# Patient Record
Sex: Female | Born: 1960 | Race: Black or African American | Hispanic: No | Marital: Married | Smoking: Never smoker
Health system: Southern US, Community
[De-identification: ages and names within clinical notes are randomized; demographics above are authoritative.]

## PROBLEM LIST (undated history)

## (undated) DIAGNOSIS — D649 Anemia, unspecified: Secondary | ICD-10-CM

## (undated) DIAGNOSIS — J189 Pneumonia, unspecified organism: Secondary | ICD-10-CM

## (undated) DIAGNOSIS — E559 Vitamin D deficiency, unspecified: Secondary | ICD-10-CM

## (undated) DIAGNOSIS — Z21 Asymptomatic human immunodeficiency virus [HIV] infection status: Secondary | ICD-10-CM

## (undated) DIAGNOSIS — R21 Rash and other nonspecific skin eruption: Secondary | ICD-10-CM

## (undated) DIAGNOSIS — B20 Human immunodeficiency virus [HIV] disease: Secondary | ICD-10-CM

## (undated) HISTORY — PX: TUBAL LIGATION: SHX77

## (undated) HISTORY — DX: Rash and other nonspecific skin eruption: R21

---

## 2011-05-28 ENCOUNTER — Emergency Department (HOSPITAL_BASED_OUTPATIENT_CLINIC_OR_DEPARTMENT_OTHER)
Admission: EM | Admit: 2011-05-28 | Discharge: 2011-05-28 | Disposition: A | Discharge status: Home / Self Care | Payer: Managed Care, Other (non HMO) | Attending: Emergency Medicine | Admitting: Emergency Medicine

## 2011-05-28 ENCOUNTER — Encounter (HOSPITAL_BASED_OUTPATIENT_CLINIC_OR_DEPARTMENT_OTHER): Payer: Self-pay | Admitting: *Deleted

## 2011-05-28 DIAGNOSIS — B029 Zoster without complications: Secondary | ICD-10-CM | POA: Insufficient documentation

## 2011-05-28 MED ORDER — ACYCLOVIR 800 MG PO TABS: 800.0000 mg | ORAL_TABLET | Freq: Four times a day (QID) | ORAL | Status: AC

## 2011-05-28 NOTE — ED Notes (Signed)
Rash on her left flank and mid abd. No pain. States friends told her she might have shingles.

## 2011-05-28 NOTE — ED Provider Notes (Signed)
Medical screening examination/treatment/procedure(s) were performed by non-physician practitioner and as supervising physician I was immediately available for consultation/collaboration. Shi Grose Y.   Gavin Pound. Oletta Lamas, MD 05/28/11 2358

## 2011-05-28 NOTE — ED Provider Notes (Signed)
History     CSN: [id number]  Arrival date & time 05/28/11  7829   First MD Initiated Contact with Patient 05/28/11 1959      Chief Complaint  Patient presents with  . Rash    (Consider location/radiation/quality/duration/timing/severity/associated sxs/prior treatment) Patient is a 51 y.o. female presenting with rash. The history is provided by the patient. No language interpreter was used.  Rash  This is a new problem. The current episode started more than 1 week ago. The problem has not changed since onset.The problem is associated with nothing. There has been no fever. The rash is present on the torso, back and abdomen. The pain is at a severity of 0/10. The patient is experiencing no pain. Associated symptoms include itching. She has tried nothing for the symptoms.  Pt thinks she may have shingles.  Pt reports itching but no pain  History reviewed. No pertinent past medical history.  History reviewed. No pertinent past surgical history.  No family history on file.  History  Substance Use Topics  . Smoking status: Never Smoker   . Smokeless tobacco: Not on file  . Alcohol Use: No    OB History    Grav Para Term Preterm Abortions TAB SAB Ect Mult Living                  Review of Systems  Skin: Positive for itching and rash.  All other systems reviewed and are negative.    Allergies  Review of patient's allergies indicates no known allergies.  Home Medications  No current outpatient prescriptions on file.  BP 179/94  Pulse 92  Temp(Src) 98.3 F (36.8 C) (Oral)  Resp 20  SpO2 100%  Physical Exam  Vitals reviewed. Constitutional: She appears well-developed and well-nourished.  HENT:  Head: Normocephalic.  Neck: Normal range of motion.  Cardiovascular: Normal rate.   Pulmonary/Chest: Effort normal.  Abdominal: Soft.  Musculoskeletal: Normal range of motion.  Neurological: She is alert.  Skin: Rash noted.       Rash back and abdomen,  Does not cross  midline,  Drying,  Psychiatric: She has a normal mood and affect.    ED Course  Procedures (including critical care time)  Labs Reviewed - No data to display No results found.   No diagnosis found.    MDM  I will treat with acyclovir.  Pt declined medication for pain.  I advised see her Md for recheck in 1 week.        Langston Masker, [place] 05/28/11 2024

## 2015-08-09 ENCOUNTER — Emergency Department (HOSPITAL_BASED_OUTPATIENT_CLINIC_OR_DEPARTMENT_OTHER): Payer: Managed Care, Other (non HMO)

## 2015-08-09 ENCOUNTER — Emergency Department (HOSPITAL_BASED_OUTPATIENT_CLINIC_OR_DEPARTMENT_OTHER)
Admission: EM | Admit: 2015-08-09 | Discharge: 2015-08-10 | Disposition: A | Discharge status: Home / Self Care | Payer: Managed Care, Other (non HMO) | Attending: Emergency Medicine | Admitting: Emergency Medicine

## 2015-08-09 ENCOUNTER — Encounter (HOSPITAL_BASED_OUTPATIENT_CLINIC_OR_DEPARTMENT_OTHER): Payer: Self-pay | Admitting: *Deleted

## 2015-08-09 DIAGNOSIS — R5383 Other fatigue: Secondary | ICD-10-CM

## 2015-08-09 DIAGNOSIS — Z862 Personal history of diseases of the blood and blood-forming organs and certain disorders involving the immune mechanism: Secondary | ICD-10-CM | POA: Insufficient documentation

## 2015-08-09 DIAGNOSIS — Z79899 Other long term (current) drug therapy: Secondary | ICD-10-CM | POA: Insufficient documentation

## 2015-08-09 DIAGNOSIS — E876 Hypokalemia: Secondary | ICD-10-CM | POA: Insufficient documentation

## 2015-08-09 HISTORY — DX: Anemia, unspecified: D64.9

## 2015-08-09 LAB — URINALYSIS, ROUTINE W REFLEX MICROSCOPIC
Glucose, UA: NEGATIVE mg/dL
Ketones, ur: 15 mg/dL — AB
Nitrite: NEGATIVE
PH: 6 (ref 5.0–8.0)
PROTEIN: 100 mg/dL — AB
SPECIFIC GRAVITY, URINE: 1.022 (ref 1.005–1.030)

## 2015-08-09 LAB — URINE MICROSCOPIC-ADD ON

## 2015-08-09 LAB — BRAIN NATRIURETIC PEPTIDE: B NATRIURETIC PEPTIDE 5: 12.2 pg/mL (ref 0.0–100.0)

## 2015-08-09 LAB — CBC WITH DIFFERENTIAL/PLATELET
BASOS ABS: 0 10*3/uL (ref 0.0–0.1)
BASOS PCT: 0 %
EOS ABS: 0.2 10*3/uL (ref 0.0–0.7)
Eosinophils Relative: 4 %
HEMATOCRIT: 30.5 % — AB (ref 36.0–46.0)
HEMOGLOBIN: 10.6 g/dL — AB (ref 12.0–15.0)
Lymphocytes Relative: 14 %
Lymphs Abs: 0.6 10*3/uL — ABNORMAL LOW (ref 0.7–4.0)
MCH: 30.2 pg (ref 26.0–34.0)
MCHC: 34.8 g/dL (ref 30.0–36.0)
MCV: 86.9 fL (ref 78.0–100.0)
MONO ABS: 0.6 10*3/uL (ref 0.1–1.0)
MONOS PCT: 14 %
NEUTROS ABS: 3 10*3/uL (ref 1.7–7.7)
NEUTROS PCT: 68 %
Platelets: 223 10*3/uL (ref 150–400)
RBC: 3.51 MIL/uL — ABNORMAL LOW (ref 3.87–5.11)
RDW: 12.9 % (ref 11.5–15.5)
WBC: 4.3 10*3/uL (ref 4.0–10.5)

## 2015-08-09 LAB — COMPREHENSIVE METABOLIC PANEL
ALK PHOS: 47 U/L (ref 38–126)
ALT: 13 U/L — ABNORMAL LOW (ref 14–54)
ANION GAP: 7 (ref 5–15)
AST: 38 U/L (ref 15–41)
Albumin: 3 g/dL — ABNORMAL LOW (ref 3.5–5.0)
BILIRUBIN TOTAL: 0.9 mg/dL (ref 0.3–1.2)
BUN: 14 mg/dL (ref 6–20)
CALCIUM: 8.5 mg/dL — AB (ref 8.9–10.3)
CO2: 25 mmol/L (ref 22–32)
Chloride: 101 mmol/L (ref 101–111)
Creatinine, Ser: 0.93 mg/dL (ref 0.44–1.00)
GFR calc Af Amer: >60 mL/min (ref >60)
GLUCOSE: 95 mg/dL (ref 65–99)
POTASSIUM: 3.1 mmol/L — AB (ref 3.5–5.1)
Sodium: 133 mmol/L — ABNORMAL LOW (ref 135–145)
TOTAL PROTEIN: 8.4 g/dL — AB (ref 6.5–8.1)

## 2015-08-09 LAB — D-DIMER, QUANTITATIVE (NOT AT ARMC): D DIMER QUANT: 1.21 ug{FEU}/mL — AB (ref 0.00–0.50)

## 2015-08-09 LAB — TROPONIN I

## 2015-08-09 MED ORDER — SODIUM CHLORIDE 0.9 % IV BOLUS (SEPSIS)
1000.0000 mL | Freq: Once | INTRAVENOUS | Status: AC
Start: 2015-08-09 — End: 2015-08-10
  Administered 2015-08-09: 1000 mL via INTRAVENOUS

## 2015-08-09 NOTE — ED Notes (Addendum)
Pt c/o fatigue and generalized weakness, n/v  with SOB on exertion x 2 week. HX anemia and weight loss

## 2015-08-09 NOTE — ED Notes (Signed)
Pt placed on auto vitals Q30. Patient placed on cardiac monitor.  

## 2015-08-09 NOTE — ED Provider Notes (Addendum)
CSN: [id number]     Arrival date & time 08/09/15  2013 History   First MD Initiated Contact with Patient 08/09/15 2257     Chief Complaint  Patient presents with  . Fatigue     (Consider location/radiation/quality/duration/timing/severity/associated sxs/prior Treatment) HPI Comments: 55 year old female with a history of anemia who presents with fatigue. The patient reports a 2 week history of generalized weakness and fatigue. She states that she has had a mildly decreased appetite because of it. She reports one episode of nausea and vomiting this morning but no persistent vomiting and no diarrhea. She currently takes iron which causes dark stools but she denies any change in her stool color and no bloody stools. No chest pain, abdominal pain, cough/cold symptoms, fevers, or recent illness. She does report some mild shortness of breath with exertion, particularly when she is walking up her driveway. This is also been going on for approximately 2 weeks. No shortness of breath at rest and no sudden onset of shortness of breath. No orthopnea or paroxysmal nocturnal dyspnea. No recent travel, history of blood clots, OCP use, or history of cancer. Family history notable for father with diabetes and possible blood clot.  The history is provided by the patient.    Past Medical History  Diagnosis Date  . Anemia    Past Surgical History  Procedure Laterality Date  . Tubal ligation     History reviewed. No pertinent family history. Social History  Substance Use Topics  . Smoking status: Never Smoker   . Smokeless tobacco: None  . Alcohol Use: No   OB History    No data available     Review of Systems 10 Systems reviewed and are negative for acute change except as noted in the HPI.    Allergies  Review of patient's allergies indicates no known allergies.  Home Medications   Prior to Admission medications   Medication Sig Start Date End Date Taking? Authorizing Provider  ferrous  sulfate 325 (65 FE) MG tablet Take 325 mg by mouth daily with breakfast.   Yes Historical Provider, MD  vitamin B-12 (CYANOCOBALAMIN) 100 MCG tablet Take 100 mcg by mouth daily.   Yes Historical Provider, MD  azithromycin (ZITHROMAX Z-PAK) 250 MG tablet Take 1 tablet (250 mg total) by mouth daily.  (2 tablets) by mouth day 1, then  (1 tablet) by mouth days 2-5 08/10/15   Laurence Spates, MD   BP 136/78 mmHg  Pulse 81  Temp(Src) 99.8 F (37.7 C) (Oral)  Resp 18  Ht  (1.6 m)  Wt 150 lb (68.04 kg)  BMI 26.58 kg/m2  SpO2 95% Physical Exam  Constitutional: She is oriented to person, place, and time. She appears well-developed and well-nourished. No distress.  HENT:  Head: Normocephalic and atraumatic.  Moist mucous membranes  Eyes: Conjunctivae are normal. Pupils are equal, round, and reactive to light.  Neck: Neck supple.  Cardiovascular: Normal rate, regular rhythm and normal heart sounds.   No murmur heard. Pulmonary/Chest: Effort normal and breath sounds normal.  Abdominal: Soft. Bowel sounds are normal. She exhibits no distension. There is no tenderness.  Musculoskeletal: She exhibits no edema.  Neurological: She is alert and oriented to person, place, and time.  Fluent speech  Skin: Skin is warm and dry.  Psychiatric: She has a normal mood and affect. Judgment normal.  Nursing note and vitals reviewed.   ED Course  Procedures (including critical care time) Labs Review Labs Reviewed  URINALYSIS, ROUTINE W  REFLEX MICROSCOPIC (NOT AT [facility]) - Abnormal; Notable for the following:    Color, Urine AMBER (*)    Hgb urine dipstick SMALL (*)    Bilirubin Urine SMALL (*)    Ketones, ur 15 (*)    Protein, ur 100 (*)    Leukocytes, UA TRACE (*)    All other components within normal limits  URINE MICROSCOPIC-ADD ON - Abnormal; Notable for the following:    Squamous Epithelial / LPF 0-5 (*)    Bacteria, UA RARE (*)    Casts GRANULAR CAST (*)    All other  components within normal limits  COMPREHENSIVE METABOLIC PANEL - Abnormal; Notable for the following:    Sodium 133 (*)    Potassium 3.1 (*)    Calcium 8.5 (*)    Total Protein 8.4 (*)    Albumin 3.0 (*)    ALT 13 (*)    All other components within normal limits  CBC WITH DIFFERENTIAL/PLATELET - Abnormal; Notable for the following:    RBC 3.51 (*)    Hemoglobin 10.6 (*)    HCT 30.5 (*)    Lymphs Abs 0.6 (*)    All other components within normal limits  D-DIMER, QUANTITATIVE (NOT AT [facility]) - Abnormal; Notable for the following:    D-Dimer, Quant 1.21 (*)    All other components within normal limits  BRAIN NATRIURETIC PEPTIDE  TROPONIN I    Imaging Review Dg Chest 2 View  08/09/2015  CLINICAL DATA:  Fatigue, weakness, shortness of breath on exertion, nausea, and vomiting. Anemia. Weight loss. Nonsmoker. EXAM: CHEST  2 VIEW COMPARISON:  None. FINDINGS: The heart size and mediastinal contours are within normal limits. Both lungs are clear. The visualized skeletal structures are unremarkable. IMPRESSION: No active cardiopulmonary disease. Electronically Signed   By: Burman Nieves M.D.   On: 08/09/2015 23:52   Ct Angio Chest Pe W/cm &/or Wo Cm  08/10/2015  CLINICAL DATA:  Shortness of breath on exertion. Fatigue. Weakness and lethargy for 2 weeks. Elevated D-dimer. EXAM: CT ANGIOGRAPHY CHEST WITH CONTRAST TECHNIQUE: Multidetector CT imaging of the chest was performed using the standard protocol during bolus administration of intravenous contrast. Multiplanar CT image reconstructions and MIPs were obtained to evaluate the vascular anatomy. CONTRAST:  80 mL Isovue 370 IV COMPARISON:  Radiographs earlier this day. FINDINGS: There are no filling defects within the pulmonary arteries to suggest pulmonary embolus. Normal caliber thoracic aorta without dissection or aneurysm. Common origin of the brachiocephalic and left common carotid artery, normal variant arch configuration. Borderline  cardiomegaly. No pericardial effusion. No adenopathy. Bilateral ground-glass opacities in the perihilar lungs, most prominent in the upper lobes. No septal thickening. There is mild bronchial thickening. Trachea and mainstem bronchi are patent. Focal small pleural based opacity in the right lower lobe likely secondary to atelectasis. No pulmonary mass. No pleural effusion. No acute abnormality in the included upper abdomen. There are no acute or suspicious osseous abnormalities. Review of the MIP images confirms the above findings. IMPRESSION: 1. No pulmonary embolus. 2. Bilateral ground-glass opacities, most prominent in the upper lobes and perihilar lungs. Considerations include pulmonary edema, pneumonitis with possible atypical infection or inflammatory etiologies. Pulmonary hemorrhage is not entirely excluded but felt less likely. Electronically Signed   By: Rubye Oaks M.D.   On: 08/10/2015 01:30   I have personally reviewed and evaluated these lab results as part of my medical decision-making.   EKG Interpretation   Date/Time:  Tuesday August 09 2015 23:30:23 EDT  Ventricular Rate:  85 PR Interval:  170 QRS Duration: 95 QT Interval:  350 QTC Calculation: 416 R Axis:   27 Text Interpretation:  Sinus rhythm Anterior infarct, old T wave inversion  V3, no previous available for comparison Confirmed by LITTLE MD, RACHEL  [phone]([id number]) on 08/09/2015 11:39:35 PM     Medications  sodium chloride 0.9 % bolus 1,000 mL (0 mLs Intravenous Stopped 08/10/15 0050)  iopamidol (ISOVUE-370) 76 % injection 80 mL (80 mLs Intravenous Contrast Given 08/10/15 0056)  potassium chloride SA (K-DUR,KLOR-CON) CR tablet 60 mEq (60 mEq Oral Given 08/10/15 0200)       MDM   Final diagnoses:  Other fatigue  Hypokalemia   Patient with 2 weeks of generalized fatigue and weakness without fever or any other infectious symptoms. She also reports 2 weeks of mild shortness of breath when walking up her driveway. She was  awake and were alert, comfortable on exam. Vital signs notable for mild hypertension. EKG without acute ischemic changes. Obtained above lab work and gave the patient an IV fluid bolus.  Labwork shows mild hypokalemia with potassium 3.1, mild anemia with hemoglobin 10.6, elevated d-dimer at 1.2, normal troponin. Obtained CT of chest to rule out PE. Gave the patient oral potassium repletion. CTA shows no PE however bilateral groundglass opacities, pulmonary edema of versus pneumonitis from atypical infection or inflammatory process. BNP normal there for heart failure unlikely. I discussed these findings with the patient and her husband. Given that she has had shortness of breath and fatigue recently, gave CAP treatment with azithromycin in event that the CT findings are due to bacterial process. I emphasized the importance of PCP follow-up for reevaluation after completion of antibiotics, as the patient may need further workup for her symptoms if they do not improve. Discussed dietary supplementation of potassium. I reviewed return precautions and the patient voiced understanding. Patient discharged in satisfactory condition.   Laurence Spatesachel Morgan Little, MD 08/10/15 [phone]

## 2015-08-09 NOTE — ED Notes (Signed)
Patient transported to X-ray 

## 2015-08-09 NOTE — ED Notes (Signed)
Warm blanket brought to pt. 

## 2015-08-09 NOTE — ED Notes (Signed)
Pt states she is jsut feeling weak and not able to bounce back

## 2015-08-10 ENCOUNTER — Emergency Department (HOSPITAL_BASED_OUTPATIENT_CLINIC_OR_DEPARTMENT_OTHER): Payer: Managed Care, Other (non HMO)

## 2015-08-10 MED ORDER — IOPAMIDOL (ISOVUE-370) INJECTION 76%
80.0000 mL | Freq: Once | INTRAVENOUS | Status: AC | PRN
  Administered 2015-08-10: 80 mL via INTRAVENOUS

## 2015-08-10 MED ORDER — POTASSIUM CHLORIDE CRYS ER 20 MEQ PO TBCR
60.0000 meq | EXTENDED_RELEASE_TABLET | Freq: Once | ORAL | Status: AC
  Administered 2015-08-10: 60 meq via ORAL
  Filled 2015-08-10: qty 3

## 2015-08-10 MED ORDER — AZITHROMYCIN 250 MG PO TABS: 250.0000 mg | ORAL_TABLET | Freq: Every day | ORAL | Status: DC

## 2015-08-10 NOTE — ED Notes (Signed)
Pt back from CT

## 2015-08-10 NOTE — Discharge Instructions (Signed)
Fatigue °Fatigue is feeling tired all of the time, a lack of energy, or a lack of motivation. Occasional or mild fatigue is often a normal response to activity or life in general. However, long-lasting (chronic) or extreme fatigue may indicate an underlying medical condition. °HOME CARE INSTRUCTIONS  °Watch your fatigue for any changes. The following actions may help to lessen any discomfort you are feeling: °· Talk to your health care provider about how much sleep you need each night. Try to get the required amount every night. °· Take medicines only as directed by your health care provider. °· Eat a healthy and nutritious diet. Ask your health care provider if you need help changing your diet. °· Drink enough fluid to keep your urine clear or pale yellow. °· Practice ways of relaxing, such as yoga, meditation, massage therapy, or acupuncture. °· Exercise regularly.   °· Change situations that cause you stress. Try to keep your work and personal routine reasonable. °· Do not abuse illegal drugs. °· Limit alcohol intake to no more than 1 drink per day for nonpregnant women and 2 drinks per day for men. One drink equals 12 ounces of beer, 5 ounces of wine, or 1½ ounces of hard liquor. °· Take a multivitamin, if directed by your health care provider. °SEEK MEDICAL CARE IF:  °· Your fatigue does not get better. °· You have a fever.   °· You have unintentional weight loss or gain. °· You have headaches.   °· You have difficulty:   °· Falling asleep. °· Sleeping throughout the night. °· You feel angry, guilty, anxious, or sad.    °· You are unable to have a bowel movement (constipation).   °· You skin is dry.    °· Your legs or another part of your body is swollen.   °SEEK IMMEDIATE MEDICAL CARE IF:  °· You feel confused.   °· Your vision is blurry. °· You feel faint or pass out.   °· You have a severe headache.   °· You have severe abdominal, pelvic, or back pain.   °· You have chest pain, shortness of breath, or an  irregular or fast heartbeat.   °· You are unable to urinate or you urinate less than normal.   °· You develop abnormal bleeding, such as bleeding from the rectum, vagina, nose, lungs, or nipples. °· You vomit blood.    °· You have thoughts about harming yourself or committing suicide.   °· You are worried that you might harm someone else.   °  °This information is not intended to replace advice given to you by your health care provider. Make sure you discuss any questions you have with your health care provider. °  °Document Released: 02/04/2007 Document Revised: 04/30/2014 Document Reviewed: 08/11/2013 °Elsevier Interactive Patient Education ©2016 [employer] ° °Hypokalemia °Hypokalemia means that the amount of potassium in the blood is lower than normal. Potassium is a chemical, called an electrolyte, that helps regulate the amount of fluid in the body. It also stimulates muscle contraction and helps nerves function properly. Most of the body's potassium is inside of cells, and only a very small amount is in the blood. Because the amount in the blood is so small, minor changes can be life-threatening. °CAUSES °· Antibiotics. °· Diarrhea or vomiting. °· Using laxatives too much, which can cause diarrhea. °· Chronic kidney disease. °· Water pills (diuretics). °· Eating disorders (bulimia). °· Low magnesium level. °· Sweating a lot. °SIGNS AND SYMPTOMS °· Weakness. °· Constipation. °· Fatigue. °· Muscle cramps. °· Mental confusion. °· Skipped heartbeats or irregular heartbeat (palpitations). °· Tingling or   numbness. °DIAGNOSIS  °Your health care provider can diagnose hypokalemia with blood tests. In addition to checking your potassium level, your health care provider may also check other lab tests. °TREATMENT °Hypokalemia can be treated with potassium supplements taken by mouth or adjustments in your current medicines. If your potassium level is very low, you may need to get potassium through a vein (IV) and be  monitored in the hospital. A diet high in potassium is also helpful. Foods high in potassium are: °· Nuts, such as peanuts and pistachios. °· Seeds, such as sunflower seeds and pumpkin seeds. °· Peas, lentils, and lima beans. °· Whole grain and bran cereals and breads. °· Fresh fruit and vegetables, such as apricots, avocado, bananas, cantaloupe, kiwi, oranges, tomatoes, asparagus, and potatoes. °· Orange and tomato juices. °· Red meats. °· Fruit yogurt. °HOME CARE INSTRUCTIONS °· Take all medicines as prescribed by your health care provider. °· Maintain a healthy diet by including nutritious food, such as fruits, vegetables, nuts, whole grains, and lean meats. °· If you are taking a laxative, be sure to follow the directions on the label. °SEEK MEDICAL CARE IF: °· Your weakness gets worse. °· You feel your heart pounding or racing. °· You are vomiting or having diarrhea. °· You are diabetic and having trouble keeping your blood glucose in the normal range. °SEEK IMMEDIATE MEDICAL CARE IF: °· You have chest pain, shortness of breath, or dizziness. °· You are vomiting or having diarrhea for more than 2 days. °· You faint. °MAKE SURE YOU:  °· Understand these instructions. °· Will watch your condition. °· Will get help right away if you are not doing well or get worse. °  °This information is not intended to replace advice given to you by your health care provider. Make sure you discuss any questions you have with your health care provider. °  °Document Released: 04/09/2005 Document Revised: 04/30/2014 Document Reviewed: 10/10/2012 °Elsevier Interactive Patient Education ©2016 [employer] Inc[employer] ° °

## 2015-08-10 NOTE — ED Notes (Signed)
Patient transported to CT 

## 2015-10-24 ENCOUNTER — Encounter (HOSPITAL_BASED_OUTPATIENT_CLINIC_OR_DEPARTMENT_OTHER): Payer: Self-pay | Admitting: *Deleted

## 2015-10-24 ENCOUNTER — Inpatient Hospital Stay (HOSPITAL_COMMUNITY): Payer: Managed Care, Other (non HMO)

## 2015-10-24 ENCOUNTER — Inpatient Hospital Stay (HOSPITAL_BASED_OUTPATIENT_CLINIC_OR_DEPARTMENT_OTHER)
Admission: EM | Admit: 2015-10-24 | Discharge: 2015-10-31 | DRG: 974 | Disposition: A | Discharge status: Home Health | Payer: Managed Care, Other (non HMO) | Attending: Internal Medicine | Admitting: Internal Medicine

## 2015-10-24 ENCOUNTER — Emergency Department (HOSPITAL_BASED_OUTPATIENT_CLINIC_OR_DEPARTMENT_OTHER): Payer: Managed Care, Other (non HMO)

## 2015-10-24 DIAGNOSIS — E876 Hypokalemia: Secondary | ICD-10-CM

## 2015-10-24 DIAGNOSIS — D649 Anemia, unspecified: Secondary | ICD-10-CM | POA: Diagnosis present

## 2015-10-24 DIAGNOSIS — B20 Human immunodeficiency virus [HIV] disease: Principal | ICD-10-CM | POA: Diagnosis present

## 2015-10-24 DIAGNOSIS — R918 Other nonspecific abnormal finding of lung field: Secondary | ICD-10-CM | POA: Diagnosis not present

## 2015-10-24 DIAGNOSIS — R531 Weakness: Secondary | ICD-10-CM

## 2015-10-24 DIAGNOSIS — R64 Cachexia: Secondary | ICD-10-CM | POA: Diagnosis present

## 2015-10-24 DIAGNOSIS — R0600 Dyspnea, unspecified: Secondary | ICD-10-CM | POA: Diagnosis not present

## 2015-10-24 DIAGNOSIS — Z87891 Personal history of nicotine dependence: Secondary | ICD-10-CM | POA: Diagnosis not present

## 2015-10-24 DIAGNOSIS — R627 Adult failure to thrive: Secondary | ICD-10-CM | POA: Diagnosis present

## 2015-10-24 DIAGNOSIS — Z6821 Body mass index (BMI) 21.0-21.9, adult: Secondary | ICD-10-CM

## 2015-10-24 DIAGNOSIS — J189 Pneumonia, unspecified organism: Secondary | ICD-10-CM | POA: Diagnosis present

## 2015-10-24 DIAGNOSIS — D509 Iron deficiency anemia, unspecified: Secondary | ICD-10-CM | POA: Diagnosis present

## 2015-10-24 DIAGNOSIS — R634 Abnormal weight loss: Secondary | ICD-10-CM | POA: Diagnosis present

## 2015-10-24 DIAGNOSIS — E440 Moderate protein-calorie malnutrition: Secondary | ICD-10-CM | POA: Diagnosis present

## 2015-10-24 DIAGNOSIS — E46 Unspecified protein-calorie malnutrition: Secondary | ICD-10-CM

## 2015-10-24 DIAGNOSIS — B59 Pneumocystosis: Secondary | ICD-10-CM | POA: Diagnosis present

## 2015-10-24 DIAGNOSIS — R3121 Asymptomatic microscopic hematuria: Secondary | ICD-10-CM | POA: Diagnosis present

## 2015-10-24 DIAGNOSIS — E559 Vitamin D deficiency, unspecified: Secondary | ICD-10-CM | POA: Diagnosis present

## 2015-10-24 DIAGNOSIS — J9601 Acute respiratory failure with hypoxia: Secondary | ICD-10-CM | POA: Diagnosis present

## 2015-10-24 DIAGNOSIS — E43 Unspecified severe protein-calorie malnutrition: Secondary | ICD-10-CM | POA: Diagnosis present

## 2015-10-24 HISTORY — DX: Pneumonia, unspecified organism: J18.9

## 2015-10-24 HISTORY — DX: Vitamin D deficiency, unspecified: E55.9

## 2015-10-24 LAB — FERRITIN: Ferritin: 994 ng/mL — ABNORMAL HIGH (ref 11–307)

## 2015-10-24 LAB — COMPREHENSIVE METABOLIC PANEL
ALT: 13 U/L — AB (ref 14–54)
AST: 36 U/L (ref 15–41)
Albumin: 2.4 g/dL — ABNORMAL LOW (ref 3.5–5.0)
Alkaline Phosphatase: 53 U/L (ref 38–126)
Anion gap: 9 (ref 5–15)
BILIRUBIN TOTAL: 0.8 mg/dL (ref 0.3–1.2)
BUN: 12 mg/dL (ref 6–20)
CHLORIDE: 99 mmol/L — AB (ref 101–111)
CO2: 26 mmol/L (ref 22–32)
CREATININE: 0.86 mg/dL (ref 0.44–1.00)
Calcium: 8.3 mg/dL — ABNORMAL LOW (ref 8.9–10.3)
Glucose, Bld: 124 mg/dL — ABNORMAL HIGH (ref 65–99)
Potassium: 3.1 mmol/L — ABNORMAL LOW (ref 3.5–5.1)
Sodium: 134 mmol/L — ABNORMAL LOW (ref 135–145)
TOTAL PROTEIN: 7.6 g/dL (ref 6.5–8.1)

## 2015-10-24 LAB — AMMONIA: AMMONIA: 26 umol/L (ref 9–35)

## 2015-10-24 LAB — CBC WITH DIFFERENTIAL/PLATELET
Basophils Absolute: 0 10*3/uL (ref 0.0–0.1)
Basophils Relative: 0 %
EOS PCT: 1 %
Eosinophils Absolute: 0.1 10*3/uL (ref 0.0–0.7)
HEMATOCRIT: 29.4 % — AB (ref 36.0–46.0)
Hemoglobin: 9.9 g/dL — ABNORMAL LOW (ref 12.0–15.0)
LYMPHS ABS: 0.4 10*3/uL — AB (ref 0.7–4.0)
Lymphocytes Relative: 8 %
MCH: 30 pg (ref 26.0–34.0)
MCHC: 33.7 g/dL (ref 30.0–36.0)
MCV: 89.1 fL (ref 78.0–100.0)
MONOS PCT: 11 %
Monocytes Absolute: 0.6 10*3/uL (ref 0.1–1.0)
NEUTROS ABS: 4.5 10*3/uL (ref 1.7–7.7)
Neutrophils Relative %: 80 %
PLATELETS: 324 10*3/uL (ref 150–400)
RBC: 3.3 MIL/uL — AB (ref 3.87–5.11)
RDW: 13.9 % (ref 11.5–15.5)
WBC: 5.6 10*3/uL (ref 4.0–10.5)

## 2015-10-24 LAB — BRAIN NATRIURETIC PEPTIDE: B Natriuretic Peptide: 45.8 pg/mL (ref 0.0–100.0)

## 2015-10-24 LAB — OCCULT BLOOD X 1 CARD TO LAB, STOOL: FECAL OCCULT BLD: NEGATIVE

## 2015-10-24 LAB — TROPONIN I
TROPONIN I: 0.03 ng/mL — AB (ref <0.03)
Troponin I: <0.03 ng/mL (ref <0.03)

## 2015-10-24 LAB — URINE MICROSCOPIC-ADD ON: WBC, UA: NONE SEEN WBC/hpf (ref 0–5)

## 2015-10-24 LAB — RETICULOCYTES
RBC.: 2.82 MIL/uL — ABNORMAL LOW (ref 3.87–5.11)
RETIC COUNT ABSOLUTE: 47.9 10*3/uL (ref 19.0–186.0)
Retic Ct Pct: 1.7 % (ref 0.4–3.1)

## 2015-10-24 LAB — SAVE SMEAR

## 2015-10-24 LAB — URINALYSIS, ROUTINE W REFLEX MICROSCOPIC
Glucose, UA: NEGATIVE mg/dL
KETONES UR: 15 mg/dL — AB
Leukocytes, UA: NEGATIVE
NITRITE: NEGATIVE
PROTEIN: 100 mg/dL — AB
SPECIFIC GRAVITY, URINE: 1.023 (ref 1.005–1.030)
pH: 6.5 (ref 5.0–8.0)

## 2015-10-24 LAB — LACTIC ACID, PLASMA
LACTIC ACID, VENOUS: 0.6 mmol/L (ref 0.5–1.9)
LACTIC ACID, VENOUS: 1.1 mmol/L (ref 0.5–1.9)

## 2015-10-24 LAB — EXPECTORATED SPUTUM ASSESSMENT W REFEX TO RESP CULTURE

## 2015-10-24 LAB — EXPECTORATED SPUTUM ASSESSMENT W GRAM STAIN, RFLX TO RESP C

## 2015-10-24 LAB — IRON AND TIBC
IRON: 11 ug/dL — AB (ref 28–170)
Saturation Ratios: 10 % — ABNORMAL LOW (ref 10.4–31.8)
TIBC: 108 ug/dL — ABNORMAL LOW (ref 250–450)
UIBC: 97 ug/dL

## 2015-10-24 LAB — VITAMIN B12: VITAMIN B 12: 1059 pg/mL — AB (ref 180–914)

## 2015-10-24 LAB — I-STAT CG4 LACTIC ACID, ED: LACTIC ACID, VENOUS: 1.05 mmol/L (ref 0.5–1.9)

## 2015-10-24 LAB — STREP PNEUMONIAE URINARY ANTIGEN: Strep Pneumo Urinary Antigen: POSITIVE — AB

## 2015-10-24 LAB — FOLATE: FOLATE: 10.4 ng/mL (ref >5.9)

## 2015-10-24 LAB — SEDIMENTATION RATE: SED RATE: 106 mm/h — AB (ref 0–22)

## 2015-10-24 MED ORDER — LEVOFLOXACIN IN D5W 750 MG/150ML IV SOLN
750.0000 mg | INTRAVENOUS | Status: DC
  Administered 2015-10-24 – 2015-10-26 (×3): 750 mg via INTRAVENOUS
  Filled 2015-10-24 (×3): qty 150

## 2015-10-24 MED ORDER — FUROSEMIDE 10 MG/ML IJ SOLN
20.0000 mg | Freq: Two times a day (BID) | INTRAMUSCULAR | Status: AC
  Administered 2015-10-24 – 2015-10-25 (×4): 20 mg via INTRAVENOUS
  Filled 2015-10-24 (×4): qty 2

## 2015-10-24 MED ORDER — SODIUM CHLORIDE 0.9 % IV SOLN
INTRAVENOUS | Status: DC
  Administered 2015-10-24 – 2015-10-30 (×2): via INTRAVENOUS

## 2015-10-24 MED ORDER — IOPAMIDOL (ISOVUE-300) INJECTION 61%
INTRAVENOUS | Status: AC
  Administered 2015-10-24: 100 mL
  Filled 2015-10-24: qty 100

## 2015-10-24 MED ORDER — ENSURE ENLIVE PO LIQD
237.0000 mL | Freq: Two times a day (BID) | ORAL | Status: DC
  Administered 2015-10-24 – 2015-10-31 (×11): 237 mL via ORAL

## 2015-10-24 MED ORDER — SODIUM CHLORIDE 0.9 % IV BOLUS (SEPSIS)
1000.0000 mL | Freq: Once | INTRAVENOUS | Status: AC
  Administered 2015-10-24: 1000 mL via INTRAVENOUS

## 2015-10-24 MED ORDER — PIPERACILLIN-TAZOBACTAM 3.375 G IVPB 30 MIN
3.3750 g | Freq: Once | INTRAVENOUS | Status: AC
  Administered 2015-10-24: 3.375 g via INTRAVENOUS
  Filled 2015-10-24 (×2): qty 50

## 2015-10-24 MED ORDER — ACETAMINOPHEN 500 MG PO TABS
1000.0000 mg | ORAL_TABLET | Freq: Once | ORAL | Status: AC
  Administered 2015-10-24: 1000 mg via ORAL
  Filled 2015-10-24: qty 2

## 2015-10-24 MED ORDER — DIATRIZOATE MEGLUMINE & SODIUM 66-10 % PO SOLN
15.0000 mL | ORAL | Status: AC
  Administered 2015-10-24 (×2): 15 mL via ORAL
  Filled 2015-10-24: qty 30

## 2015-10-24 MED ORDER — IPRATROPIUM-ALBUTEROL 0.5-2.5 (3) MG/3ML IN SOLN
3.0000 mL | Freq: Four times a day (QID) | RESPIRATORY_TRACT | Status: DC
  Administered 2015-10-24 – 2015-10-25 (×2): 3 mL via RESPIRATORY_TRACT
  Filled 2015-10-24 (×3): qty 3

## 2015-10-24 MED ORDER — IPRATROPIUM-ALBUTEROL 0.5-2.5 (3) MG/3ML IN SOLN
3.0000 mL | RESPIRATORY_TRACT | Status: DC
  Administered 2015-10-24 (×2): 3 mL via RESPIRATORY_TRACT
  Filled 2015-10-24: qty 3

## 2015-10-24 MED ORDER — VITAMIN B-12 100 MCG PO TABS
100.0000 ug | ORAL_TABLET | Freq: Every day | ORAL | Status: DC
  Administered 2015-10-24 – 2015-10-31 (×8): 100 ug via ORAL
  Filled 2015-10-24 (×8): qty 1

## 2015-10-24 MED ORDER — POTASSIUM CHLORIDE CRYS ER 20 MEQ PO TBCR
40.0000 meq | EXTENDED_RELEASE_TABLET | Freq: Once | ORAL | Status: AC
  Administered 2015-10-24: 40 meq via ORAL
  Filled 2015-10-24: qty 2

## 2015-10-24 MED ORDER — FERROUS SULFATE 325 (65 FE) MG PO TABS
325.0000 mg | ORAL_TABLET | Freq: Every day | ORAL | Status: DC
  Administered 2015-10-24 – 2015-10-31 (×8): 325 mg via ORAL
  Filled 2015-10-24 (×8): qty 1

## 2015-10-24 MED ORDER — VANCOMYCIN HCL IN DEXTROSE 1-5 GM/200ML-% IV SOLN
1000.0000 mg | Freq: Once | INTRAVENOUS | Status: AC
  Administered 2015-10-24: 1000 mg via INTRAVENOUS
  Filled 2015-10-24: qty 200

## 2015-10-24 MED ORDER — POTASSIUM CHLORIDE 10 MEQ/100ML IV SOLN
10.0000 meq | INTRAVENOUS | Status: AC
  Administered 2015-10-24 (×3): 10 meq via INTRAVENOUS
  Filled 2015-10-24 (×3): qty 100

## 2015-10-24 NOTE — ED Notes (Signed)
To CT

## 2015-10-24 NOTE — Progress Notes (Signed)
Initial Nutrition Assessment  DOCUMENTATION CODES:   Non-severe (moderate) malnutrition in context of chronic illness  INTERVENTION:   -Ensure Enlive po BID, each supplement provides 350 kcal and 20 grams of protein  NUTRITION DIAGNOSIS:   Malnutrition related to chronic illness as evidenced by mild depletion of body fat, mild depletion of muscle mass, percent weight loss.  GOAL:   Patient will meet greater than or equal to 90% of their needs  MONITOR:   PO intake, Supplement acceptance, Labs, Weight trends, Skin, I & O's  REASON FOR ASSESSMENT:   Consult, Malnutrition Screening Tool Poor PO  ASSESSMENT:   Samantha Kline is a 55 y.o. female with medical history significant for anemia, Vit D deficiency [facility] with progressive weakness over the last 6 months, worse over the last 2 days.  Pt admitted with acute respiratory failure with hypoxia.   Spoke with pt and family member at bedside. Both describe a general decline in health over the past year. Per pt, appetite has been poor for the past year but has gradually decreased over time. Pt reveals that she tries to consume food every 45 minutes, but family member reports that this is usually only a few bites over time "and overall it doesn't amount to nothing". She recently started consuming Ensure or Premier Protein once daily.   Pt reports weight loss, however, unable to quantify amount. Per wt hx, pt has experienced a 23# (15.3%) wt loss over the past 3 months.  Pt reports appetite has been improving since hospitalization and actually consumed 100% of breakfast. She is amenable to Ensure supplements.   Nutrition-Focused physical exam completed. Findings are mild to moderate fat depletion, mild to moderate muscle depletion, and no edema.   Labs reviewed.  Diet Order:  Diet Heart Room service appropriate?: Yes; Fluid consistency:: Thin  Skin:  Reviewed, no issues  Last BM:  10/24/15  Height:    Ht Readings from Last 1 Encounters:  10/24/15 5\' 4"  (1.626 m)    Weight:   Wt Readings from Last 1 Encounters:  10/24/15 127 lb 1.6 oz (57.652 kg)    Ideal Body Weight:  54.5 kg  BMI:  Body mass index is 21.81 kg/(m^2).  Estimated Nutritional Needs:   Kcal:  1500-1700  Protein:  75-90 grams  Fluid:  1.5-1.7 L  EDUCATION NEEDS:   Education needs addressed  Akeira Lahm A. Mayford KnifeWilliams, RD, LDN, CDE Pager: [phone] After hours Pager: [phone]

## 2015-10-24 NOTE — Consult Note (Signed)
Name: Samantha Kline MRN: [id number] DOB: [birth year]    ADMISSION DATE:  10/24/2015 CONSULTATION DATE:  10/24/15  REFERRING MD :  Eulas Post [facility])  CHIEF COMPLAINT:  Fever, Weight Loss, Weakness   HISTORY OF PRESENT ILLNESS:  Samantha Kline is a 55 y.o. female with a PMH as outlined below.  She presented to [facility] ED 07/02 with fever, weakness / fatigue, continued weight loss, SOB, and new cough.  Has had fevers on and off for a few weeks, unsure what Tmax has been.  Weakness / fatigue has been going on for several months now and has progressed.  Of concern is that she has also had roughly 50 lb unintentional weight loss over the past 5 - 6 months along with decreased appetite. Of note, she had been seen in ED in April 2017 for SOB.  CTA at the time was negative for PE but did reveal generalized GGO's.  She followed up with PCP at [facility] and various labs were assessed to further evaluate weakness / fatigue.  At that time, it was mentioned to consider CT of the chest / abd / pelvis to further evaluate etiology of weight loss / rule out malignancy.  This has yet to be done.  She states a day or so prior to ED presentation, she began to have cough, mostly dry but intermittently productive of clear sputum.  She has never had any hemoptysis.  Denies any chills / sweats.  Does have hx of tobacco use in the 80's, roughly 3 - 4 pack year hx but has also been exposed to second hand smoke up until roughly 3 years ago.  She has had a colonoscopy 7 years ago per report which only revealed a benign polyp.  She has not had any changes in bowel habits, hematochezia, melena, N/V/D, abd pain, dysuria, hematuria, myalgias.  She used to work in day care and denies ever being exposed to any toxic chemicals.  She has never had any pets in the home.  She denies any hx of dysphagia or aspiration.    PAST MEDICAL HISTORY :   has a past medical history of Anemia and Vitamin D deficiency.  has past surgical history that includes  Tubal ligation. Prior to Admission medications   Medication Sig Start Date End Date Taking? Authorizing Provider  azithromycin (ZITHROMAX Z-PAK) 250 MG tablet Take 1 tablet (250 mg total) by mouth daily. '500mg'$  (2 tablets) by mouth day 1, then '250mg'$  (1 tablet) by mouth days 2-5 08/10/15   Sharlett Iles, MD  ferrous sulfate 325 (65 FE) MG tablet Take 325 mg by mouth daily with breakfast.    Historical Provider, MD  vitamin B-12 (CYANOCOBALAMIN) 100 MCG tablet Take 100 mcg by mouth daily.    Historical Provider, MD   No Known Allergies  FAMILY HISTORY:  family history is not on file. SOCIAL HISTORY:  reports that she has never smoked. She does not have any smokeless tobacco history on file. She reports that she does not drink alcohol or use illicit drugs.  REVIEW OF SYSTEMS:   All negative; except for those that are bolded, which indicate positives.  Constitutional: weight loss (50 lbs over past 6 months), weight gain, night sweats, fevers, chills, fatigue, weakness.  HEENT: headaches, sore throat, sneezing, nasal congestion, post nasal drip, difficulty swallowing, tooth/dental problems, visual complaints, visual changes, ear aches. Neuro: difficulty with speech, weakness, numbness, ataxia. CV:  chest pain, orthopnea, PND, swelling in lower extremities, dizziness, palpitations, syncope.  Resp: cough,  hemoptysis, dyspnea, wheezing. GI  heartburn, indigestion, abdominal pain, nausea, vomiting, diarrhea, constipation, change in bowel habits, loss of appetite, hematemesis, melena, hematochezia.  GU: dysuria, change in color of urine, urgency or frequency, flank pain, hematuria. MSK: joint pain or swelling, decreased range of motion. Psych: change in mood or affect, depression, anxiety, suicidal ideations, homicidal ideations. Skin: rash, itching, bruising.    SUBJECTIVE:  Denies any chest pain, SOB.  Feels very tired.  VITAL SIGNS: Temp:  [98.3 F (36.8 C)-101.8 F (38.8 C)] 98.3 F  (36.8 C) (07/03 0654) Pulse Rate:  [78-108] 78 (07/03 0654) Resp:  [20-30] 24 (07/03 0654) BP: (109-143)/(61-80) 109/61 mmHg (07/03 0654) SpO2:  [85 %-99 %] 97 % (07/03 0902) Weight:  [127 lb 1.6 oz (57.652 kg)-150 lb (68.04 kg)] 127 lb 1.6 oz (57.652 kg) (07/03 0654)  PHYSICAL EXAMINATION: General: Middle aged female, appears older than stated age, resting in bed visiting with family, in NAD. Neuro: A&O x 3, non-focal.  HEENT: [place]/AT. PERRL, sclerae anicteric. Cardiovascular: RRR, no M/R/G.  Lungs: Respirations even and unlabored.  Faint bibasilar crackles. Abdomen: BS x 4, soft, NT/ND.  Musculoskeletal: No gross deformities, no edema.  Skin: Intact, warm, no rashes.     Recent Labs Lab 10/24/15 0310  NA 134*  K 3.1*  CL 99*  CO2 26  BUN 12  CREATININE 0.86  GLUCOSE 124*    Recent Labs Lab 10/24/15 0310  HGB 9.9*  HCT 29.4*  WBC 5.6  PLT 324   Dg Chest 2 View  10/24/2015  CLINICAL DATA:  Subacute onset of generalized weakness. Incontinence. Initial encounter. EXAM: CHEST  2 VIEW COMPARISON:  CTA of the chest performed 08/10/2015, and chest radiograph performed 08/09/2015 FINDINGS: The lungs are well-aerated. Hazy bilateral airspace opacification may reflect persistent or recurrent ground-glass opacification as noted on prior CTA, which could be seen with pulmonary edema, pneumonitis or possibly pulmonary alveolar hemorrhage. There is no evidence of pleural effusion or pneumothorax. The heart is normal in size; the mediastinal contour is within normal limits. No acute osseous abnormalities are seen. IMPRESSION: Hazy bilateral airspace opacification may reflect persistent or recurrent ground-glass opacification as noted on prior CTA, which could be seen with pulmonary edema, pneumonitis or possibly pulmonary alveolar hemorrhage. Electronically Signed   By: Garald Balding M.D.   On: 10/24/2015 04:07   Ct Head Wo Contrast  10/24/2015  CLINICAL DATA:  Subacute onset of  generalized weakness. Incontinence. Initial encounter. EXAM: CT HEAD WITHOUT CONTRAST TECHNIQUE: Contiguous axial images were obtained from the base of the skull through the vertex without intravenous contrast. COMPARISON:  None. FINDINGS: There is no evidence of acute infarction, mass lesion, or intra- or extra-axial hemorrhage on CT. Prominence of the ventricles sulci reflects mild cortical volume loss. Mild periventricular white matter change likely reflects small vessel ischemic microangiopathy. Mild cerebellar atrophy is noted. The brainstem and fourth ventricle are within normal limits. The basal ganglia are unremarkable in appearance. The cerebral hemispheres demonstrate grossly normal gray-white differentiation. No mass effect or midline shift is seen. There is no evidence of fracture; visualized osseous structures are unremarkable in appearance. The visualized portions of the orbits are within normal limits. The paranasal sinuses and mastoid air cells are well-aerated. No significant soft tissue abnormalities are seen. IMPRESSION: 1. No acute intracranial pathology seen on CT. 2. Mild cortical volume loss and scattered small vessel ischemic microangiopathy. Electronically Signed   By: Garald Balding M.D.   On: 10/24/2015 04:08    STUDIES:  CXR 7/3 > bilateral airspace opacification. CT head 7/3 > no acute process.  SIGNIFICANT EVENTS  07/03 > admit.  ASSESSMENT / PLAN:  Acute hypoxic respiratory failure - has had associated intermittent cough along with fevers, generalized weakness / fatigue. Bilateral GGO's on CTA chest from April - unclear etiology at this point. Same as above. Plan: Continue supplemental O2 as needed to maintain SpO2 > 92%. Assess ESR, RF, ANA, ANCA, dsDNA, C3, C4. Repeat CT chest - ensure no nodules / masses. Continue levaquin. Assess echo. CXR intermittently.  Weight loss - 50 lbs unintentional over past 6 months + tobacco hx, certainly raises concern for  malignancy. Plan: Assess CT abd / pelvis.   Rest per primary team.   Montey Hora, PA - [facility] Pager: [phone]  or [phone] 10/24/2015, 10:07 AM    STAFF NOTE: I, Merrie Roof, MD FACP have personally reviewed patient's available data, including medical history, events of note, physical examination and test results as part of my evaluation. I have discussed with resident/NP and other care providers such as pharmacist, RN and RRT. In addition, I personally evaluated patient and elicited key findings of: no distress, cachectic, no lymphad, coarse bases mild, CT last reviewed and now PCXR, the initial CT with slight unimpressive int changes bases, concern is malignancy with 50 pounds wt loss over 6 months, will CT chest , Ct abdo/pelvis, with wisky int changes initially seen will assess small autoimmune panel assessment, ct could also be c/w edema, jvd noted, echo assessment, will follow, I personally updated son and pt in room, no empiric steroids at this time  Lavon Paganini. Titus Mould, MD, [place] Pgr: [facility] 10/24/2015 11:52 AM

## 2015-10-24 NOTE — ED Notes (Signed)
Pt has been feeling weak and it has gotten worse over the last 3 weeks. Pt sts her weakness has gotten worse each day. Pt has been incontinent of urine and stool. Pt denies N/V and denies pain. Family sts pt [facility].

## 2015-10-24 NOTE — Progress Notes (Signed)
New pt admitted into 5w34 from [facility] with progressive weakness. On arrival patient verbal and oriented. Made comfortable in bed and oriented to the room. Safety measures put in place and cardiac monitor placed on her. Will keep monitoring

## 2015-10-24 NOTE — H&P (Signed)
History and Physical    Samantha Kline ZOX:[id number] DOB: 01/16/1961 DOA: 10/24/2015   PCP: No primary care provider on file.   Patient coming from:  Home    Chief Complaint: Generalized Weakness   HPI: Samantha Kline is a 55 y.o. female with medical history significant for anemia, Vit D deficiency presenting to [facility] with progressive weakness over the last 6 months, worse over the last 2 days. She was seen at [facility] on 7/1 but no apparent etiology for her symptoms were found and was sent home.  Patient had initially been seen in April 2017 with the same complaints. CT A was performed, negative for PE,but bilateral groundglass opacities, pulmonary edema versus pneumonitis versus atypical infection versus inflammatory process was noted. She was treated prophylactically for, and instructed to follow-up with PCP, which she failed to do so. Since that time, the patient has lost 50 pounds, with significantly decreased appetite, she denies any nausea or vomiting. No diarrhea. She feels very tired,with increased shortness of breath. She denies any cough, or hemoptysis. She denies any chest pains or palpitations. She denies any recent fevers until these admission, with a tmax of 101,without chills or night sweats. She reportssick contacts from her grandkids.She denies any recent trips. She denies any pica.   ED Course:  BP 109/61 mmHg  Pulse 78  Temp(Src) 98.3 F (36.8 C) (Oral)  Resp 24  Ht  (1.626 m)  Wt 57.652 kg (127 lb 1.6 oz)  BMI 21.81 kg/m2  SpO2 99%   Got 1 l IVF bolus  Received IV Vanc/Zosyn  Cultures are pending.  Chest x-ray shows hazy bilateral air space opacification, which may reflect persistent or recurrent groundglass opacification is noted on prior CTA 07/2015, suspicious for pulmonary edema, pneumonitis or possible pulmonary alveolar hemorrhage.  Hemoccult negative. CT of the head negative for acute intracranial hemorrhage or lesions. Potassium 3.1. Albumin  2.4. ALT 13. Troponin 0.03. Lactic acid 1. 5 EKG with sinus tachycardia and LVH, QTc406 Glucose 124. Admitted for Tele Inpatient  Review of Systems: As per HPI otherwise 10 point review of systems negative.   Past Medical History  Diagnosis Date  . Anemia   . Vitamin D deficiency     Past Surgical History  Procedure Laterality Date  . Tubal ligation      Social History Social History   Social History  . Marital Status: Married    Spouse Name: N/A  . Number of Children: N/A  . Years of Education: N/A   Occupational History  . Not on file.   Social History Main Topics  . Smoking status: Never Smoker   . Smokeless tobacco: Not on file  . Alcohol Use: No  . Drug Use: No  . Sexual Activity: Not on file   Other Topics Concern  . Not on file   Social History Narrative     No Known Allergies  History reviewed. No pertinent family history.    Prior to Admission medications   Medication Sig Start Date End Date Taking? Authorizing Provider  azithromycin (ZITHROMAX Z-PAK) 250 MG tablet Take 1 tablet (250 mg total) by mouth daily.  (2 tablets) by mouth day 1, then  (1 tablet) by mouth days 2-5 08/10/15   Laurence Spates, MD  ferrous sulfate 325 (65 FE) MG tablet Take 325 mg by mouth daily with breakfast.    Historical Provider, MD  vitamin B-12 (CYANOCOBALAMIN) 100 MCG tablet Take 100 mcg by mouth daily.  Historical Provider, MD    Physical Exam:    Filed Vitals:   10/24/15 0330 10/24/15 0507 10/24/15 0515 10/24/15 0654  BP: 143/75 131/76 132/73 109/61  Pulse: 102  84 78  Temp:  98.9 F (37.2 C)  98.3 F (36.8 C)  TempSrc:  Oral  Oral  Resp: 30 20 27 24   Height:    5\' 4"  (1.626 m)  Weight:    57.652 kg (127 lb 1.6 oz)  SpO2: 97% 98% 98% 99%       Constitutional: NAD, calm, chronically ill appearing, cachectic Filed Vitals:   10/24/15 0330 10/24/15 0507 10/24/15 0515 10/24/15 0654  BP: 143/75 131/76 132/73 109/61  Pulse: 102  84 78    Temp:  98.9 F (37.2 C)  98.3 F (36.8 C)  TempSrc:  Oral  Oral  Resp: 30 20 27 24   Height:    5\' 4"  (1.626 m)  Weight:    57.652 kg (127 lb 1.6 oz)  SpO2: 97% 98% 98% 99%   Eyes: PERRL, lids and conjunctivae normal ENMT: Mucous membranes are moist. Posterior pharynx clear of any exudate or lesions.Normal dentition.  Neck: normal, supple, no masses, no thyromegaly Respiratory: clear to auscultation bilaterally, no wheezing, diffuse crackles respiratory effort. No accessory muscle use.  Cardiovascular:  Tachy  rate and rhythm, no murmurs / rubs / gallops. No extremity edema. 2+ pedal pulses. No carotid bruits.  Abdomen: no tenderness, no masses palpated. No hepatosplenomegaly. Bowel sounds positive.  Musculoskeletal: no clubbing / cyanosis. No joint deformity upper and lower extremities. Good ROM, no contractures. Normal muscle tone.  Skin: no rashes, lesions, ulcers.  Neurologic: CN 2-12 grossly intact. Sensation intact, DTR normal. Strength 5/5 in all 4.  Psychiatric: Normal judgment and insight. Alert and oriented x 3. Normal mood.     Labs on Admission: I have personally reviewed following labs and imaging studies  CBC:  Recent Labs Lab 10/24/15 0310  WBC 5.6  NEUTROABS 4.5  HGB 9.9*  HCT 29.4*  MCV 89.1  PLT 324    Basic Metabolic Panel:  Recent Labs Lab 10/24/15 0310  NA 134*  K 3.1*  CL 99*  CO2 26  GLUCOSE 124*  BUN 12  CREATININE 0.86  CALCIUM 8.3*    GFR: Estimated Creatinine Clearance: 63.1 mL/min (by C-G formula based on Cr of 0.86).  Liver Function Tests:  Recent Labs Lab 10/24/15 0310  AST 36  ALT 13*  ALKPHOS 53  BILITOT 0.8  PROT 7.6  ALBUMIN 2.4*   No results for input(s): LIPASE, AMYLASE in the last 168 hours.  Recent Labs Lab 10/24/15 0319  AMMONIA 26    Coagulation Profile: No results for input(s): INR, PROTIME in the last 168 hours.  Cardiac Enzymes:  Recent Labs Lab 10/24/15 0310  TROPONINI 0.03*    BNP (last  3 results) No results for input(s): PROBNP in the last 8760 hours.  HbA1C: No results for input(s): HGBA1C in the last 72 hours.  CBG: No results for input(s): GLUCAP in the last 168 hours.  Lipid Profile: No results for input(s): CHOL, HDL, LDLCALC, TRIG, CHOLHDL, LDLDIRECT in the last 72 hours.  Thyroid Function Tests: No results for input(s): TSH, T4TOTAL, FREET4, T3FREE, THYROIDAB in the last 72 hours.  Anemia Panel: No results for input(s): VITAMINB12, FOLATE, FERRITIN, TIBC, IRON, RETICCTPCT in the last 72 hours.  Urine analysis:    Component Value Date/Time   COLORURINE AMBER* 10/24/2015 0450   APPEARANCEUR CLEAR 10/24/2015 0450  LABSPEC 1.023 10/24/2015 0450   PHURINE 6.5 10/24/2015 0450   GLUCOSEU NEGATIVE 10/24/2015 0450   HGBUR MODERATE* 10/24/2015 0450   BILIRUBINUR SMALL* 10/24/2015 0450   KETONESUR 15* 10/24/2015 0450   PROTEINUR 100* 10/24/2015 0450   NITRITE NEGATIVE 10/24/2015 0450   LEUKOCYTESUR NEGATIVE 10/24/2015 0450    Sepsis Labs: @LABRCNTIP (procalcitonin:4,lacticidven:4) )No results found for this or any previous visit (from the past 240 hour(s)).   Radiological Exams on Admission: Dg Chest 2 View  10/24/2015  CLINICAL DATA:  Subacute onset of generalized weakness. Incontinence. Initial encounter. EXAM: CHEST  2 VIEW COMPARISON:  CTA of the chest performed 08/10/2015, and chest radiograph performed 08/09/2015 FINDINGS: The lungs are well-aerated. Hazy bilateral airspace opacification may reflect persistent or recurrent ground-glass opacification as noted on prior CTA, which could be seen with pulmonary edema, pneumonitis or possibly pulmonary alveolar hemorrhage. There is no evidence of pleural effusion or pneumothorax. The heart is normal in size; the mediastinal contour is within normal limits. No acute osseous abnormalities are seen. IMPRESSION: Hazy bilateral airspace opacification may reflect persistent or recurrent ground-glass opacification as  noted on prior CTA, which could be seen with pulmonary edema, pneumonitis or possibly pulmonary alveolar hemorrhage. Electronically Signed   By: Roanna RaiderJeffery  Chang M.D.   On: 10/24/2015 04:07   Ct Head Wo Contrast  10/24/2015  CLINICAL DATA:  Subacute onset of generalized weakness. Incontinence. Initial encounter. EXAM: CT HEAD WITHOUT CONTRAST TECHNIQUE: Contiguous axial images were obtained from the base of the skull through the vertex without intravenous contrast. COMPARISON:  None. FINDINGS: There is no evidence of acute infarction, mass lesion, or intra- or extra-axial hemorrhage on CT. Prominence of the ventricles sulci reflects mild cortical volume loss. Mild periventricular white matter change likely reflects small vessel ischemic microangiopathy. Mild cerebellar atrophy is noted. The brainstem and fourth ventricle are within normal limits. The basal ganglia are unremarkable in appearance. The cerebral hemispheres demonstrate grossly normal gray-white differentiation. No mass effect or midline shift is seen. There is no evidence of fracture; visualized osseous structures are unremarkable in appearance. The visualized portions of the orbits are within normal limits. The paranasal sinuses and mastoid air cells are well-aerated. No significant soft tissue abnormalities are seen. IMPRESSION: 1. No acute intracranial pathology seen on CT. 2. Mild cortical volume loss and scattered small vessel ischemic microangiopathy. Electronically Signed   By: Roanna RaiderJeffery  Chang M.D.   On: 10/24/2015 04:08    EKG: Independently reviewed.  Assessment/Plan Active Problems:   PNA (pneumonia)   Generalized weakness   Anemia    Acute respiratory failure with hypoxia, unknown etiology at this time. Chest x-ray shows hazy bilateral air space opacification, which may reflect persistent or recurrent groundglass opacification is noted on prior CTA 07/2015, suspicious for pulmonary edema, pneumonitis or possible pulmonary alveolar  hemorrhage. Tmax 101. Received IVF 1l NS and IV Vanc/Zosyn x1. Lactic acid 1. 5. EKG sinus tachycardia and LVH, QTc406. Hb 9.9 (see below). Rule out malignancy in view of prior tobacco history.  Admit to tele Suppportive care with oxygen as needed -Scheduled DuoNeb with prn every 4 hours pr -Respiratory therapy with spirometry and pulm. toilet HIV to rule out viral etiology of symptoms Await pancultures report.  Pulmonary consult Levaquin IV daily x 5 days  Serial Lactic acid initiated in ED, will follow results Cycle troponin.   Generalized weakness, chronic unknown etiology at this time, in the setting of anemia, abnormal Xray r/o pulmonary hemorrhage. Last colonoscopy 2010 normal . Hb 9.9 ,  Hcult negative Albumin 2.4 . Glu 124 gative . Weight loss 50 lbs since April 2017. Patient has been on and off Iron supplementation, not recently due to constipation issues Check Iron panel  CBC with diff in am  Consult PT/OT/Nutrition consult  Hypokalemia, may be nutritional. EKG sinus tachycardia and LVH, QTc406  Current K 3.1 Repenishment with KCL x3 Repeat CMET in am    DVT prophylaxis: SCD's   Code Status:   Full    Family Communication:  Discussed with patient wife husband  Disposition Plan: Expect patient to be discharged to home after condition improves Consults called:   Pulmonary: Dr. Tyson AliasFeinstein Admission status: Inpatient  Medsurg     Marcos EkeWERTMAN,Tationa Stech E, PA-C [facility]   If 7PM-7AM, please contact night-coverage [email or web address] Password [email or web address]  10/24/2015, 8:32 AM

## 2015-10-24 NOTE — ED Notes (Signed)
Pt's family sts she was seen [facility] days ago for same complaint and they could not find a reason for her weakness. He sts she was d/c'ed home. Family also sts that pt is much weaker today than she was then.

## 2015-10-24 NOTE — ED Notes (Signed)
Attempted to call report. RN from 5W will call back.

## 2015-10-24 NOTE — Plan of Care (Signed)
55 yo F with probable PNA, very slow to respond, cachectic, deteriorating over period of months, worse over last couple of days.  Patient going to tele.

## 2015-10-24 NOTE — ED Provider Notes (Signed)
CSN: [id number]     Arrival date & time 10/24/15  0257 History   First MD Initiated Contact with Patient 10/24/15 0308     Chief Complaint  Patient presents with  . Weakness     (Consider location/radiation/quality/duration/timing/severity/associated sxs/prior Treatment) Patient is a 55 y.o. female presenting with weakness. The history is provided by a relative. The history is limited by the condition of the patient.  Weakness This is a chronic problem. The current episode started more than 1 week ago. The problem occurs constantly. The problem has been gradually worsening. Pertinent negatives include no chest pain and no shortness of breath. Nothing aggravates the symptoms. Nothing relieves the symptoms. Treat[facility]. The treatment provided no relief.    Past Medical History  Diagnosis Date  . Anemia   . Vitamin D deficiency    Past Surgical History  Procedure Laterality Date  . Tubal ligation     History reviewed. No pertinent family history. Social History  Substance Use Topics  . Smoking status: Never Smoker   . Smokeless tobacco: None  . Alcohol Use: No   OB History    No data available     Review of Systems  Unable to perform ROS: Acuity of condition  Respiratory: Negative for shortness of breath.   Cardiovascular: Negative for chest pain.  Neurological: Positive for weakness.  All other systems reviewed and are negative.     Allergies  Review of patient's allergies indicates no known allergies.  Home Medications   Prior to Admission medications   Medication Sig Start Date End Date Taking? Authorizing Provider  azithromycin (ZITHROMAX Z-PAK) 250 MG tablet Take 1 tablet (250 mg total) by mouth daily. 500mg  (2 tablets) by mouth day 1, then 250mg  (1 tablet) by mouth days 2-5 08/10/15   Laurence Spatesachel Morgan Little, MD  ferrous sulfate 325 (65 FE) MG tablet Take 325 mg by mouth daily with breakfast.    Historical Provider, MD  vitamin B-12  (CYANOCOBALAMIN) 100 MCG tablet Take 100 mcg by mouth daily.    Historical Provider, MD   BP 135/80 mmHg  Pulse 108  Temp(Src) 101.8 F (38.8 C) (Oral)  Resp 24  Wt 150 lb (68.04 kg)  SpO2 94% Physical Exam  Constitutional: She appears well-developed and well-nourished. No distress.  HENT:  Head: Normocephalic and atraumatic.  Mouth/Throat: Oropharynx is clear and moist.  Eyes: Conjunctivae are normal. Pupils are equal, round, and reactive to light.  Neck: Normal range of motion. Neck supple.  Cardiovascular: Normal rate, regular rhythm and intact distal pulses.   Pulmonary/Chest: No respiratory distress. She has decreased breath sounds. She has no wheezes.  Abdominal: Soft. Bowel sounds are normal. There is no tenderness. There is no rebound and no guarding.  Musculoskeletal: Normal range of motion. She exhibits no edema or tenderness.  Lymphadenopathy:    She has no cervical adenopathy.  Neurological: She is alert. She has normal reflexes.  Skin: Skin is warm and dry. There is pallor.  Psychiatric: She has a normal mood and affect.    ED Course  Procedures (including critical care time) Labs Review Labs Reviewed  CULTURE, BLOOD (ROUTINE X 2)  CULTURE, BLOOD (ROUTINE X 2)  OCCULT BLOOD X 1 CARD TO LAB, STOOL  CBC WITH DIFFERENTIAL/PLATELET  COMPREHENSIVE METABOLIC PANEL  URINALYSIS, ROUTINE W REFLEX MICROSCOPIC (NOT AT [facility])  TROPONIN I  I-STAT CG4 LACTIC ACID, ED    Imaging Review No results found. I have personally reviewed and evaluated  these images and lab results as part of my medical decision-making.   EKG Interpretation None      MDM   Final diagnoses:  None     EKG Interpretation  Date/Time:  Monday October 24 2015 03:12:29 EDT Ventricular Rate:  112 PR Interval:    QRS Duration: 81 QT Interval:  297 QTC Calculation: 406 R Axis:   16 Text Interpretation:  Sinus tachycardia Left ventricular hypertrophy Confirmed by [facility]  MD, Morene Antu ([id number])  on 10/24/2015 3:30:34 AM      Results for orders placed or performed during the hospital encounter of 10/24/15  CBC with Differential/Platelet  Result Value Ref Range   WBC 5.6 4.0 - 10.5 K/uL   RBC 3.30 (L) 3.87 - 5.11 MIL/uL   Hemoglobin 9.9 (L) 12.0 - 15.0 g/dL   HCT 13.0 (L) 86.5 - 78.4 %   MCV 89.1 78.0 - 100.0 fL   MCH 30.0 26.0 - 34.0 pg   MCHC 33.7 30.0 - 36.0 g/dL   RDW 69.6 29.5 - 28.4 %   Platelets 324 150 - 400 K/uL   Neutrophils Relative % 80 %   Lymphocytes Relative 8 %   Monocytes Relative 11 %   Eosinophils Relative 1 %   Basophils Relative 0 %   Neutro Abs 4.5 1.7 - 7.7 K/uL   Lymphs Abs 0.4 (L) 0.7 - 4.0 K/uL   Monocytes Absolute 0.6 0.1 - 1.0 K/uL   Eosinophils Absolute 0.1 0.0 - 0.7 K/uL   Basophils Absolute 0.0 0.0 - 0.1 K/uL  Comprehensive metabolic panel  Result Value Ref Range   Sodium 134 (L) 135 - 145 mmol/L   Potassium 3.1 (L) 3.5 - 5.1 mmol/L   Chloride 99 (L) 101 - 111 mmol/L   CO2 26 22 - 32 mmol/L   Glucose, Bld 124 (H) 65 - 99 mg/dL   BUN 12 6 - 20 mg/dL   Creatinine, Ser 1.32 0.44 - 1.00 mg/dL   Calcium 8.3 (L) 8.9 - 10.3 mg/dL   Total Protein 7.6 6.5 - 8.1 g/dL   Albumin 2.4 (L) 3.5 - 5.0 g/dL   AST 36 15 - 41 U/L   ALT 13 (L) 14 - 54 U/L   Alkaline Phosphatase 53 38 - 126 U/L   Total Bilirubin 0.8 0.3 - 1.2 mg/dL   GFR calc non Af Amer >60 >60 mL/min   GFR calc Af Amer >60 >60 mL/min   Anion gap 9 5 - 15  Occult blood card to lab, stool Provider will collect  Result Value Ref Range   Fecal Occult Bld NEGATIVE NEGATIVE  Troponin I  Result Value Ref Range   Troponin I 0.03 (HH) <0.03 ng/mL  Brain natriuretic peptide  Result Value Ref Range   B Natriuretic Peptide 45.8 0.0 - 100.0 pg/mL  Ammonia  Result Value Ref Range   Ammonia 26 9 - 35 umol/L  I-Stat CG4 Lactic Acid, ED  Result Value Ref Range   Lactic Acid, Venous 1.05 0.5 - 1.9 mmol/L   Dg Chest 2 View  10/24/2015  CLINICAL DATA:  Subacute onset of generalized weakness.  Incontinence. Initial encounter. EXAM: CHEST  2 VIEW COMPARISON:  CTA of the chest performed 08/10/2015, and chest radiograph performed 08/09/2015 FINDINGS: The lungs are well-aerated. Hazy bilateral airspace opacification may reflect persistent or recurrent ground-glass opacification as noted on prior CTA, which could be seen with pulmonary edema, pneumonitis or possibly pulmonary alveolar hemorrhage. There is no evidence of pleural effusion or pneumothorax.  The heart is normal in size; the mediastinal contour is within normal limits. No acute osseous abnormalities are seen. IMPRESSION: Hazy bilateral airspace opacification may reflect persistent or recurrent ground-glass opacification as noted on prior CTA, which could be seen with pulmonary edema, pneumonitis or possibly pulmonary alveolar hemorrhage. Electronically Signed   By: Roanna RaiderJeffery  Chang M.D.   On: 10/24/2015 04:07   Ct Head Wo Contrast  10/24/2015  CLINICAL DATA:  Subacute onset of generalized weakness. Incontinence. Initial encounter. EXAM: CT HEAD WITHOUT CONTRAST TECHNIQUE: Contiguous axial images were obtained from the base of the skull through the vertex without intravenous contrast. COMPARISON:  None. FINDINGS: There is no evidence of acute infarction, mass lesion, or intra- or extra-axial hemorrhage on CT. Prominence of the ventricles sulci reflects mild cortical volume loss. Mild periventricular white matter change likely reflects small vessel ischemic microangiopathy. Mild cerebellar atrophy is noted. The brainstem and fourth ventricle are within normal limits. The basal ganglia are unremarkable in appearance. The cerebral hemispheres demonstrate grossly normal gray-white differentiation. No mass effect or midline shift is seen. There is no evidence of fracture; visualized osseous structures are unremarkable in appearance. The visualized portions of the orbits are within normal limits. The paranasal sinuses and mastoid air cells are  well-aerated. No significant soft tissue abnormalities are seen. IMPRESSION: 1. No acute intracranial pathology seen on CT. 2. Mild cortical volume loss and scattered small vessel ischemic microangiopathy. Electronically Signed   By: Roanna RaiderJeffery  Chang M.D.   On: 10/24/2015 04:08    Medications  vancomycin (VANCOCIN) IVPB 1000 mg/200 mL premix (not administered)  acetaminophen (TYLENOL) tablet 1,000 mg (1,000 mg Oral Given 10/24/15 0357)  sodium chloride 0.9 % bolus 1,000 mL (1,000 mLs Intravenous New Bag/Given 10/24/15 0358)  piperacillin-tazobactam (ZOSYN) IVPB 3.375 g (3.375 g Intravenous New Bag/Given 10/24/15 0439)    Case d/w Dr. Anthoney HaradaGarder admit to tele inpatient.      Cy BlamerApril Jimeka Balan, MD 10/24/15 [phone]

## 2015-10-25 ENCOUNTER — Inpatient Hospital Stay (HOSPITAL_COMMUNITY): Payer: Managed Care, Other (non HMO)

## 2015-10-25 DIAGNOSIS — R0600 Dyspnea, unspecified: Secondary | ICD-10-CM

## 2015-10-25 DIAGNOSIS — J9601 Acute respiratory failure with hypoxia: Secondary | ICD-10-CM

## 2015-10-25 DIAGNOSIS — D509 Iron deficiency anemia, unspecified: Secondary | ICD-10-CM

## 2015-10-25 DIAGNOSIS — E876 Hypokalemia: Secondary | ICD-10-CM

## 2015-10-25 LAB — ECHOCARDIOGRAM COMPLETE
CHL CUP MV DEC (S): 176
CHL CUP STROKE VOLUME: 37 mL
E decel time: 176 msec
E/e' ratio: 6.97
FS: 27 % — AB (ref 28–44)
Height: 64 in
IVS/LV PW RATIO, ED: 0.95
LA ID, A-P, ES: 32 mm
LA vol A4C: 49.8 ml
LA vol index: 33.3 mL/m2
LADIAMINDEX: 1.99 cm/m2
LAVOL: 53.6 mL
LDCA: 3.14 cm2
LEFT ATRIUM END SYS DIAM: 32 mm
LV E/e' medial: 6.97
LV TDI E'LATERAL: 9.25
LV sys vol: 28 mL (ref 14–42)
LVDIAVOL: 65 mL (ref 46–106)
LVDIAVOLIN: 40 mL/m2
LVEEAVG: 6.97
LVELAT: 9.25 cm/s
LVOT diameter: 20 mm
LVSYSVOLIN: 17 mL/m2
MV pk E vel: 64.5 m/s
MVPKAVEL: 88.3 m/s
PW: 11.7 mm — AB (ref 0.6–1.1)
Simpson's disk: 58
TAPSE: 25.1 mm
TDI e' medial: 7.4
WEIGHTICAEL: 2033.6 [oz_av]

## 2015-10-25 LAB — CBC WITH DIFFERENTIAL/PLATELET
BASOS ABS: 0 10*3/uL (ref 0.0–0.1)
BASOS PCT: 0 %
Eosinophils Absolute: 0.1 10*3/uL (ref 0.0–0.7)
Eosinophils Relative: 2 %
HEMATOCRIT: 27.4 % — AB (ref 36.0–46.0)
HEMOGLOBIN: 8.9 g/dL — AB (ref 12.0–15.0)
Lymphocytes Relative: 6 %
Lymphs Abs: 0.3 10*3/uL — ABNORMAL LOW (ref 0.7–4.0)
MCH: 28.9 pg (ref 26.0–34.0)
MCHC: 32.5 g/dL (ref 30.0–36.0)
MCV: 89 fL (ref 78.0–100.0)
Monocytes Absolute: 0.4 10*3/uL (ref 0.1–1.0)
Monocytes Relative: 10 %
NEUTROS ABS: 3.6 10*3/uL (ref 1.7–7.7)
NEUTROS PCT: 82 %
Platelets: 319 10*3/uL (ref 150–400)
RBC: 3.08 MIL/uL — AB (ref 3.87–5.11)
RDW: 14.7 % (ref 11.5–15.5)
WBC: 4.4 10*3/uL (ref 4.0–10.5)

## 2015-10-25 LAB — COMPREHENSIVE METABOLIC PANEL
ALBUMIN: 2 g/dL — AB (ref 3.5–5.0)
ALK PHOS: 42 U/L (ref 38–126)
ALT: 14 U/L (ref 14–54)
AST: 34 U/L (ref 15–41)
Anion gap: 7 (ref 5–15)
BILIRUBIN TOTAL: 0.4 mg/dL (ref 0.3–1.2)
BUN: 8 mg/dL (ref 6–20)
CALCIUM: 8.3 mg/dL — AB (ref 8.9–10.3)
CO2: 28 mmol/L (ref 22–32)
CREATININE: 0.71 mg/dL (ref 0.44–1.00)
Chloride: 99 mmol/L — ABNORMAL LOW (ref 101–111)
GFR calc Af Amer: >60 mL/min (ref >60)
GFR calc non Af Amer: >60 mL/min (ref >60)
GLUCOSE: 95 mg/dL (ref 65–99)
Potassium: 3.5 mmol/L (ref 3.5–5.1)
Sodium: 134 mmol/L — ABNORMAL LOW (ref 135–145)
TOTAL PROTEIN: 6.8 g/dL (ref 6.5–8.1)

## 2015-10-25 LAB — RHEUMATOID FACTOR: Rhuematoid fact SerPl-aCnc: 13.1 IU/mL (ref 0.0–13.9)

## 2015-10-25 LAB — LACTATE DEHYDROGENASE: LDH: 327 U/L — ABNORMAL HIGH (ref 98–192)

## 2015-10-25 LAB — MPO/PR-3 (ANCA) ANTIBODIES: Myeloperoxidase Abs: <9 U/mL (ref 0.0–9.0)

## 2015-10-25 LAB — C3 COMPLEMENT: C3 Complement: 99 mg/dL (ref 82–167)

## 2015-10-25 LAB — LEGIONELLA PNEUMOPHILA SEROGP 1 UR AG: L. pneumophila Serogp 1 Ur Ag: NEGATIVE

## 2015-10-25 LAB — C4 COMPLEMENT: COMPLEMENT C4, BODY FLUID: 40 mg/dL (ref 14–44)

## 2015-10-25 MED ORDER — POTASSIUM CHLORIDE CRYS ER 20 MEQ PO TBCR
40.0000 meq | EXTENDED_RELEASE_TABLET | Freq: Once | ORAL | Status: AC
  Administered 2015-10-25: 40 meq via ORAL
  Filled 2015-10-25: qty 2

## 2015-10-25 MED ORDER — IPRATROPIUM-ALBUTEROL 0.5-2.5 (3) MG/3ML IN SOLN
3.0000 mL | Freq: Three times a day (TID) | RESPIRATORY_TRACT | Status: DC
  Administered 2015-10-25 – 2015-10-28 (×10): 3 mL via RESPIRATORY_TRACT
  Filled 2015-10-25 (×10): qty 3

## 2015-10-25 NOTE — Progress Notes (Signed)
CSW received consult regarding PT recommendation of SNF at discharge.  Patient is refusing SNF. Husband is at bedside and asked CSW about possible home health services.  CSW signing off.   Samantha Kline LCSWA [phone]

## 2015-10-25 NOTE — Progress Notes (Signed)
PROGRESS NOTE  Lorna FewHythia Culton ZOX:[id number] DOB: 04/01/1961 DOA: 10/24/2015 PCP: No PCP Per Patient  HPI/Recap of past 24 hours:  Remain weak, family in room  Assessment/Plan: Active Problems:   PNA (pneumonia)   Generalized weakness   Anemia   Weight loss   Malnourished (HCC)   Acute hypoxemic respiratory failure (HCC)    Acute respiratory failure with hypoxia, unknown etiology at this time. Chest x-ray shows hazy bilateral air space opacification, which may reflect persistent or recurrent groundglass opacification is noted on prior CTA 07/2015, suspicious for pulmonary edema, pneumonitis or possible pulmonary alveolar hemorrhage. Tmax 101. Received IVF 1l NS and IV Vanc/Zosyn x1. Lactic acid 1. 5. EKG sinus tachycardia and LVH, QTc406. Hb 9.9 (see below). Rule out malignancy in view of prior tobacco history. Suppportive care with oxygen as needed -Scheduled DuoNeb with prn every 4 hours pr -Respiratory therapy with spirometry and pulm. toilet HIV /pancultures/autoimmune serology in process Urine strep pneumo antigen +, Levaquin IV daily x 5 days  Ct chest /ab/pel on 7/3 with" Multiple irregular pulmonary nodular opacities' bilaterally.  Pulmonary consult  Iron deficiency anemia/ Generalized weakness, chronic/progressive weight loss  unknown etiology at this time, in the setting of anemia, abnormal chest imaging. Last colonoscopy 2010 normal . Hb 9.9 , Hcult negative Albumin 2.4 . Glu 124 gative . Weight loss 50 lbs since April 2017. Patient has been on and off Iron supplementation, not recently due to constipation issues  Iron panel with iron deficiency, stool FOBT negative, ua with few rbc, started iron supplement No indication for blood transfusion currently, continue monitor hgb Consult PT/OT/Nutrition consult  Hypokalemia, may be nutritional. EKG sinus tachycardia and LVH, QTc406 Current K 3.1 on admission Repenished Repeat CMET/mag in am    DVT prophylaxis: SCD's   Code Status: Full  Family Communication: Discussed with patient and her two sons in room  Disposition Plan: remain in the hospital  A few days, she is refusing snf placement Consults called: Pulmonary   Procedures:  none  Antibiotics:  levaquin   Objective: BP 112/62 mmHg  Pulse 103  Temp(Src) 98.8 F (37.1 C) (Oral)  Resp 16  Ht 5\' 4"  (1.626 m)  Wt 57.652 kg (127 lb 1.6 oz)  BMI 21.81 kg/m2  SpO2 96%  Intake/Output Summary (Last 24 hours) at 10/25/15 1627 Last data filed at 10/25/15 1100  Gross per 24 hour  Intake    270 ml  Output   1800 ml  Net  -1530 ml   Filed Weights   10/24/15 0306 10/24/15 0654  Weight: 68.04 kg (150 lb) 57.652 kg (127 lb 1.6 oz)    Exam:   General:  Frail and underweight  Cardiovascular: RRR  Respiratory: bibasilar crackles, good air movement,  Abdomen: Soft/ND/NT, positive BS  Musculoskeletal: No Edema  Neuro: aaox3  Data Reviewed: Basic Metabolic Panel:  Recent Labs Lab 10/24/15 0310 10/25/15 0538  NA 134* 134*  K 3.1* 3.5  CL 99* 99*  CO2 26 28  GLUCOSE 124* 95  BUN 12 8  CREATININE 0.86 0.71  CALCIUM 8.3* 8.3*   Liver Function Tests:  Recent Labs Lab 10/24/15 0310 10/25/15 0538  AST 36 34  ALT 13* 14  ALKPHOS 53 42  BILITOT 0.8 0.4  PROT 7.6 6.8  ALBUMIN 2.4* 2.0*   No results for input(s): LIPASE, AMYLASE in the last 168 hours.  Recent Labs Lab 10/24/15 0319  AMMONIA 26   CBC:  Recent Labs Lab 10/24/15 0310 10/25/15 [id number]  WBC 5.6 4.4  NEUTROABS 4.5 3.6  HGB 9.9* 8.9*  HCT 29.4* 27.4*  MCV 89.1 89.0  PLT 324 319   Cardiac Enzymes:    Recent Labs Lab 10/24/15 0310 10/24/15 0844 10/24/15 1442 10/24/15 2003  TROPONINI 0.03* <0.03 <0.03 <0.03   BNP (last 3 results)  Recent Labs  08/09/15 2320 10/24/15 0310  BNP 12.2 45.8    ProBNP (last 3 results) No results for input(s): PROBNP in the last 8760 hours.  CBG: No results for input(s): GLUCAP in the last 168  hours.  Recent Results (from the past 240 hour(s))  Blood culture (routine x 2)     Status: None (Preliminary result)   Collection Time: 10/24/15  3:10 AM  Result Value Ref Range Status   Specimen Description BLOOD LEFT ARM  Final   Special Requests BOTTLES DRAWN AEROBIC AND ANAEROBIC 5 ML  Final   Culture   Final    NO GROWTH 1 DAY [facility]    Report Status PENDING  Incomplete  Blood culture (routine x 2)     Status: None (Preliminary result)   Collection Time: 10/24/15  3:19 AM  Result Value Ref Range Status   Specimen Description BLOOD RIGHT ARM  Final   Special Requests BOTTLES DRAWN AEROBIC AND ANAEROBIC 5 ML  Final   Culture   Final    NO GROWTH 1 DAY Performed at [facility]    Report Status PENDING  Incomplete  Culture, sputum-assessment     Status: None   Collection Time: 10/24/15 12:10 PM  Result Value Ref Range Status   Specimen Description SPUTUM  Final   Special Requests NONE  Final   Sputum evaluation   Final    THIS SPECIMEN IS ACCEPTABLE. RESPIRATORY CULTURE REPORT TO FOLLOW.   Report Status 10/24/2015 FINAL  Final  Culture, respiratory (NON-Expectorated)     Status: None (Preliminary result)   Collection Time: 10/24/15 12:10 PM  Result Value Ref Range Status   Specimen Description SPUTUM  Final   Special Requests NONE  Final   Gram Stain   Final    ABUNDANT WBC PRESENT, PREDOMINANTLY PMN RARE SQUAMOUS EPITHELIAL CELLS PRESENT FEW GRAM POSITIVE COCCI IN PAIRS AND CHAINS RARE GRAM NEGATIVE RODS RARE GRAM POSITIVE RODS    Culture CULTURE REINCUBATED FOR BETTER GROWTH  Final   Report Status PENDING  Incomplete     Studies: No results found.  Scheduled Meds: . feeding supplement (ENSURE ENLIVE)  237 mL Oral BID BM  . ferrous sulfate  325 mg Oral Q breakfast  . furosemide  20 mg Intravenous Q12H  . ipratropium-albuterol  3 mL Nebulization TID  . levofloxacin (LEVAQUIN) IV  750 mg Intravenous Q24H  . vitamin B-12  100  mcg Oral Daily    Continuous Infusions: . sodium chloride 10 mL/hr at 10/24/15 1020     Time spent: 35mins  Janeann Paisley MD, PhD  [facility] Pager [phone]. If 7PM-7AM, please contact night-coverage at [email or web address], password [email or web address] 10/25/2015, 4:27 PM  LOS: 1 day

## 2015-10-25 NOTE — Evaluation (Signed)
Physical Therapy Evaluation Patient Details Name: Samantha Kline MRN: [id number] DOB: 1960/05/01 Today's Date: 10/25/2015   History of Present Illness  Samantha Kline is a 55yo woman who [facility] for evaluation of cough, fever, progressive weakness, and fatigue.The patient has had unexplained weight loss, fatigue, decreased appetite with food aversion, and shortness of breath for several months. CT chest probable PNA, lesion on left kidney concerning for mass  Clinical Impression  Pt currently with limited mobility and activity tolerance, able to ambulate 40 feet without an assistive device and using 2LO2 but with unsteady gait pattern and poor stamina. Anticipating gradual progression with activity tolerance and possible need for SNF for further rehabilitation needs prior to returning home. PT to continue to follow and modify D/C recommendations as needed based upon the patient's progress. Will continue to follow.     Follow Up Recommendations Other (comment) SNF vs Home (would need HHPT if going home)    Equipment Recommendations   pt currently refusing assistive device but may benefit from use, will further assess during future sessions   Recommendations for Other Services       Precautions / Restrictions Precautions Precautions: Fall Precaution Comments: monitor O2 Restrictions Weight Bearing Restrictions: No      Mobility  Bed Mobility Overal bed mobility: Modified Independent             General bed mobility comments: pt sitting EOB upon arrival.   Transfers Overall transfer level: Needs assistance Equipment used: None Transfers: Sit to/from Stand Sit to Stand: Min guard         General transfer comment: X2, using hand from bed to assist   Ambulation/Gait Ambulation/Gait assistance: Min guard Ambulation Distance (Feet): 40 Feet Assistive device: None Gait Pattern/deviations: Step-through pattern;Decreased step length - right;Decreased  step length - left Gait velocity: very slow pattern   General Gait Details: slow pattern, on 2L O2 throughout, SpO2 95% upon return to chair. Cues to avoid objects in room.  Stairs            Wheelchair Mobility    Modified Rankin (Stroke Patients Only)       Balance Overall balance assessment: Needs assistance Sitting-balance support: No upper extremity supported Sitting balance-Leahy Scale: Good     Standing balance support: No upper extremity supported Standing balance-Leahy Scale: Fair Standing balance comment: able to stand and ambulate without UE support but unsteady pattern.                             Pertinent Vitals/Pain Pain Assessment: No/denies pain    Home Living Family/patient expects to be discharged to:: Unsure (willing to do whatever is advised) Living Arrangements: Spouse/significant other Available Help at Discharge: Family;Available 24 hours/day Type of Home: House Home Access: Stairs to enter Entrance Stairs-Rails: None Entrance Stairs-Number of Steps: 2 Home Layout: Multi-level Home Equipment: None      Prior Function Level of Independence: Independent               Hand Dominance   Dominant Hand: Right    Extremity/Trunk Assessment   Upper Extremity Assessment: Generalized weakness           Lower Extremity Assessment: Generalized weakness         Communication   Communication: Other (comment) (occasional difficulty due to short of breath)  Cognition Arousal/Alertness: Awake/alert Behavior During Therapy: Flat affect Overall Cognitive Status: Within Functional Limits for tasks  assessed                      General Comments General comments (skin integrity, edema, etc.): SpO2: on RA 87%, 2L seated 97%, 95% following ambulation.      Exercises        Assessment/Plan    PT Assessment Patient needs continued PT services  PT Diagnosis Difficulty walking;Generalized weakness   PT Problem  List Decreased strength;Decreased activity tolerance;Decreased balance;Decreased mobility  PT Treatment Interventions DME instruction;Gait training;Stair training;Functional mobility training;Therapeutic activities;Therapeutic exercise;Balance training;Patient/family education   PT Goals (Current goals can be found in the Care Plan section) Acute Rehab PT Goals Patient Stated Goal: be able to be active and independent again PT Goal Formulation: With patient Time For Goal Achievement: 11/08/15 Potential to Achieve Goals: Good    Frequency Min 3X/week   Barriers to discharge        Co-evaluation               End of Session Equipment Utilized During Treatment: Gait belt;Oxygen Activity Tolerance: Patient limited by fatigue Patient left: in chair;with call bell/phone within reach;with family/visitor present Nurse Communication: Mobility status;Other (comment) (SpO2 results)         Time: 1204-1229 PT Time Calculation (min) (ACUTE ONLY): 25 min   Charges:   PT Evaluation $PT Eval Moderate Complexity: 1 Procedure PT Treatments $Gait Training: 8-22 mins   PT G Codes:        Christiane HaBenjamin J. Kaynan Klonowski, PT, CSCS Pager [phone] Office [phone]  10/25/2015, 1:11 PM

## 2015-10-25 NOTE — Evaluation (Signed)
Occupational Therapy Evaluation Patient Details Name: Samantha Kline MRN: [id number] DOB: February 24, 1961 Today's Date: 10/25/2015    History of Present Illness Samantha Kline is a 55yo woman who presented to the ED in [facility] for evaluation of cough, fever, progressive weakness, and fatigue.The patient has had unexplained weight loss, fatigue, decreased appetite with food aversion, and shortness of breath for several months. CT chest probable PNA, lesion on left kidney concerning for mass   Clinical Impression   This 55 yo female admitted for above presents to acute OT with deficits below (see OT problem list) thus affecting her PLOF of Independent with basic and IADLs. She will benefit from acute OT with follow up OT on CIR to get back to PLOF.    Follow Up Recommendations  CIR    Equipment Recommendations  3 in 1 bedside comode       Precautions / Restrictions Precautions Precautions: Fall Precaution Comments: monitor O2 Restrictions Weight Bearing Restrictions: No      Mobility Bed Mobility Overal bed mobility: Modified Independent             General bed mobility comments: HOB and use of rail  Transfers Overall transfer level: Needs assistance Equipment used: 1 person hand held assist Transfers: Sit to/from Stand Sit to Stand: Min assist         General transfer comment: Min A (1 person HHA) to ambulate from bed to far end of sink and turn around to go back to bed (restricted at present for ambulation due to need for O2 and only had 1 tubing extension in room at present)    Balance Overall balance assessment: Needs assistance Sitting-balance support: No upper extremity supported;Feet supported Sitting balance-Leahy Scale: Good     Standing balance support: Single extremity supported Standing balance-Leahy Scale: Poor Standing balance comment: reliant on 1 UE support static standing and Bil UE support with dynamic standing                             ADL Overall ADL's : Needs assistance/impaired Eating/Feeding: Independent;Sitting   Grooming: Set up;Supervision/safety;Sitting   Upper Body Bathing: Set up;Supervision/ safety;Sitting   Lower Body Bathing: Minimal assistance;Sit to/from stand   Upper Body Dressing : Set up;Supervision/safety;Sitting   Lower Body Dressing: Minimal assistance;Sit to/from stand   Toilet Transfer: Minimal assistance;Ambulation Toilet Transfer Details (indicate cue type and reason): one person HHA (bed>far edge of sink>bed)--pt feeling like she needs to use her other hand to stedy herself as well on furniture surfaces in room Toileting- Clothing Manipulation and Hygiene: Minimal assistance;Sit to/from stand         General ADL Comments: O2 has be intermittently monitored throughout session and pt required education and cuing for purse lipped breathing. Pt having a difficult time coordinating use of inspirometer and when she does suck in it is only minmal.               Pertinent Vitals/Pain Pain Assessment: No/denies pain     Hand Dominance Right   Extremity/Trunk Assessment Upper Extremity Assessment Upper Extremity Assessment: Generalized weakness   Lower Extremity Assessment Lower Extremity Assessment: Generalized weakness       Communication Communication Communication: No difficulties   Cognition Arousal/Alertness: Awake/alert Behavior During Therapy: Flat affect (pt reports she is frustrated with the whole situation of being tired and not feeling well--she says this in response to me asking about her breathing which sounds a little bit intermittently  labored) Overall Cognitive Status: Within Functional Limits for tasks assessed                                Home Living Family/patient expects to be discharged to:: Private residence Living Arrangements: Spouse/significant other Available Help at Discharge: Family;Available 24 hours/day Type of Home: House Home  Access: Stairs to enter Entergy CorporationEntrance Stairs-Number of Steps: 2 Entrance Stairs-Rails: None Home Layout: Multi-level Alternate Level Stairs-Number of Steps: 5 up and 5 down Alternate Level Stairs-Rails: None Bathroom Shower/Tub:  (takes tub baths)   Bathroom Toilet: Standard     Home Equipment: None          Prior Functioning/Environment Level of Independence: Independent             OT Diagnosis: Generalized weakness   OT Problem List: Decreased strength;Decreased range of motion;Impaired balance (sitting and/or standing);Decreased activity tolerance;Cardiopulmonary status limiting activity;Decreased knowledge of use of DME or AE   OT Treatment/Interventions: Self-care/ADL training;Patient/family education;Balance training;Therapeutic activities;DME and/or AE instruction    OT Goals(Current goals can be found in the care plan section) Acute Rehab OT Goals Patient Stated Goal: to get this figured out so I can feel better OT Goal Formulation: With patient Time For Goal Achievement: 11/08/15 Potential to Achieve Goals: Good  OT Frequency: Min 2X/week              End of Session    Activity Tolerance: Patient limited by fatigue Patient left: in bed;with call bell/phone within reach;with bed alarm set;with family/visitor present   Time: [id number] OT Time Calculation (min): 28 min Charges:  OT General Charges $OT Visit: 1 Procedure OT Evaluation $OT Eval Moderate Complexity: 1 Procedure OT Treatments $Self Care/Home Management : 8-22 mins  Evette GeorgesLeonard, Kayonna Lawniczak [phone] 10/25/2015, 9:42 AM

## 2015-10-25 NOTE — Progress Notes (Signed)
Echocardiogram 2D Echocardiogram has been performed.  Dorothey BasemanReel, Anarosa Kubisiak M 10/25/2015, 10:04 AM

## 2015-10-25 NOTE — Consult Note (Addendum)
Name: Samantha Kline MRN: [id number] DOB: 1961-02-18    ADMISSION DATE:  10/24/2015 CONSULTATION DATE:  10/24/15  REFERRING MD :  Eulas [facility])  CHIEF COMPLAINT:  Fever, Weight Loss, Weakness   BRIEF:  Samantha Kline is a 55 y.o. female with a PMH as outlined below.  She presented to [facility] ED 07/02 with fever, weakness / fatigue, continued weight loss, SOB, and new cough.  Has had fevers on and off for a few weeks, unsure what Tmax has been.  Weakness / fatigue has been going on for several months now and has progressed.  Of concern is that she has also had roughly 50 lb unintentional weight loss over the past 5 - 6 months along with decreased appetite. Of note, she had been seen in ED in April 2017 for SOB.  CTA at the time was negative for PE but did reveal generalized GGO's.  She followed up with PCP [facility] and various labs were assessed to further evaluate weakness / fatigue.  At that time, it was mentioned to consider CT of the chest / abd / pelvis to further evaluate etiology of weight loss / rule out malignancy.  This has yet to be done.  She states a day or so prior to ED presentation, she began to have cough, mostly dry but intermittently productive of clear sputum.  She has never had any hemoptysis.  Denies any chills / sweats.  Does have hx of tobacco use in the 80's, roughly 3 - 4 pack year hx but has also been exposed to second hand smoke up until roughly 3 years ago.  She has had a colonoscopy 7 years ago per report which only revealed a benign polyp.  She has not had any changes in bowel habits, hematochezia, melena, N/V/D, abd pain, dysuria, hematuria, myalgias.  She used to work in day care and denies ever being exposed to any toxic chemicals.  She has never had any pets in the home.  She denies any hx of dysphagia or aspiration.    SUBJECTIVE/OVERNIGHT/INTERVAL HX 10/25/15 - sitting in chair. Son and grandson by bedside. Feels exhausted. 2LNC   VITAL SIGNS: Temp:  [98.5 F  (36.9 C)-99.1 F (37.3 C)] 99.1 F (37.3 C) (07/04 0528) Pulse Rate:  [79-87] 87 (07/04 0528) Resp:  [16-17] 16 (07/04 0528) BP: (112-130)/(63-76) 130/76 mmHg (07/04 0528) SpO2:  [82 %-100 %] 82 % (07/04 0911) 2LNC  PHYSICAL EXAMINATION: General: Middle aged female, appears older than stated age, resting in bed visiting with family, in [place] and [place] Neuro: A&O x 3, non-focal.  HEENT: [place]/AT. PERRL, sclerae anicteric. Cardiovascular: RRR, no M/R/G.  Lungs: Respirations even and unlabored.  Faint bibasilar crackles. Abdomen: BS x 4, soft, NT/ND.  Musculoskeletal: No gross deformities, no edema.  Skin: Intact, warm, no rashes.   PULMONARY No results for input(s): PHART, PCO2ART, PO2ART, HCO3, TCO2, O2SAT in the last 168 hours.  Invalid input(s): PCO2, PO2  CBC  Recent Labs Lab 10/24/15 0310 10/25/15 0538  HGB 9.9* 8.9*  HCT 29.4* 27.4*  WBC 5.6 4.4  PLT 324 319    COAGULATION No results for input(s): INR in the last 168 hours.  CARDIAC   Recent Labs Lab 10/24/15 0310 10/24/15 0844 10/24/15 1442 10/24/15 2003  TROPONINI 0.03* <0.03 <0.03 <0.03   No results for input(s): PROBNP in the last 168 hours.   CHEMISTRY  Recent Labs Lab 10/24/15 0310 10/25/15 0538  NA 134* 134*  K 3.1* 3.5  CL  99* 99*  CO2 26 28  GLUCOSE 124* 95  BUN 12 8  CREATININE 0.86 0.71  CALCIUM 8.3* 8.3*   Estimated Creatinine Clearance: 67.8 mL/min (by C-G formula based on Cr of 0.71).   LIVER  Recent Labs Lab 10/24/15 0310 10/25/15 0538  AST 36 34  ALT 13* 14  ALKPHOS 53 42  BILITOT 0.8 0.4  PROT 7.6 6.8  ALBUMIN 2.4* 2.0*     INFECTIOUS  Recent Labs Lab 10/24/15 0319 10/24/15 0746 10/24/15 1044  LATICACIDVEN 1.05 0.6 1.1     ENDOCRINE CBG (last 3)  No results for input(s): GLUCAP in the last 72 hours.       IMAGING x48h  - image(s) personally visualized  -   highlighted in bold Dg Chest 2 View  10/24/2015  CLINICAL DATA:  Subacute  onset of generalized weakness. Incontinence. Initial encounter. EXAM: CHEST  2 VIEW COMPARISON:  CTA of the chest performed 08/10/2015, and chest radiograph performed 08/09/2015 FINDINGS: The lungs are well-aerated. Hazy bilateral airspace opacification may reflect persistent or recurrent ground-glass opacification as noted on prior CTA, which could be seen with pulmonary edema, pneumonitis or possibly pulmonary alveolar hemorrhage. There is no evidence of pleural effusion or pneumothorax. The heart is normal in size; the mediastinal contour is within normal limits. No acute osseous abnormalities are seen. IMPRESSION: Hazy bilateral airspace opacification may reflect persistent or recurrent ground-glass opacification as noted on prior CTA, which could be seen with pulmonary edema, pneumonitis or possibly pulmonary alveolar hemorrhage. Electronically Signed   By: Garald Balding M.D.   On: 10/24/2015 04:07   Ct Head Wo Contrast  10/24/2015  CLINICAL DATA:  Subacute onset of generalized weakness. Incontinence. Initial encounter. EXAM: CT HEAD WITHOUT CONTRAST TECHNIQUE: Contiguous axial images were obtained from the base of the skull through the vertex without intravenous contrast. COMPARISON:  None. FINDINGS: There is no evidence of acute infarction, mass lesion, or intra- or extra-axial hemorrhage on CT. Prominence of the ventricles sulci reflects mild cortical volume loss. Mild periventricular white matter change likely reflects small vessel ischemic microangiopathy. Mild cerebellar atrophy is noted. The brainstem and fourth ventricle are within normal limits. The basal ganglia are unremarkable in appearance. The cerebral hemispheres demonstrate grossly normal gray-white differentiation. No mass effect or midline shift is seen. There is no evidence of fracture; visualized osseous structures are unremarkable in appearance. The visualized portions of the orbits are within normal limits. The paranasal sinuses and  mastoid air cells are well-aerated. No significant soft tissue abnormalities are seen. IMPRESSION: 1. No acute intracranial pathology seen on CT. 2. Mild cortical volume loss and scattered small vessel ischemic microangiopathy. Electronically Signed   By: Garald Balding M.D.   On: 10/24/2015 04:08   Ct Chest W Contrast  10/24/2015  CLINICAL DATA:  Weight loss and chronic weakness for 6 months EXAM: CT CHEST, ABDOMEN, AND PELVIS WITH CONTRAST TECHNIQUE: Multidetector CT imaging of the chest, abdomen and pelvis was performed following the standard protocol during bolus administration of intravenous contrast. Oral contrast was also administered for the CT abdomen and pelvis portions. CONTRAST:  129m ISOVUE-300 IOPAMIDOL (ISOVUE-300) INJECTION 61% COMPARISON:  Chest radiograph September 29, 2015 FINDINGS: CT CHEST FINDINGS Mediastinum/Lymph Nodes: Thyroid appears normal. There is a single lymph node just to the right of the carina anteriorly measuring 1.5 x 1.0 cm. No other lymph node prominence is evident in the chest region. There is a fairly small focal hiatal hernia. There is atherosclerotic calcification in the aorta.  There is no demonstrable thoracic aortic aneurysm or dissection. The visualized great vessels appear unremarkable. The left and right common carotid arteries arise as a common trunk, an anatomic variant. There are scattered foci of coronary artery calcification. The pericardium is not appreciably thickened. There is no major vessel pulmonary embolus evident. Lungs/Pleura: There is extensive ground-glass type opacity throughout the lungs. There are multiple irregular nodular opacities throughout the lungs, more on the right than on the left. Nodular opacities range in size from as small as 3 mm to as large as 1.3 x 1.1 cm. There is patchy confluence in both lower lobes with associated ground-glass type opacity in these areas. There is tree-in-bud type appearance in the right upper lobe. Musculoskeletal:  There are no blastic or lytic bone lesions. There is degenerative change in the thoracic spine. CT ABDOMEN PELVIS FINDINGS Hepatobiliary: There is hepatic steatosis. No focal liver lesions are evident. The gallbladder is contracted without appreciable gallbladder wall thickening. There is no biliary duct dilatation appreciable. Pancreas: No pancreatic mass or inflammatory focus. Spleen: No splenic lesions are evident. Adrenals/Urinary Tract: Adrenals appear unremarkable bilaterally. There is a 7 x 7 mm cyst in the anterior upper to mid left kidney. On delayed phase images, there is subtle enhancement in the anterior mid left kidney measuring 1.5 x 1.1 cm, best appreciated on axial slice 19 series 3 a 1. No other renal lesions are evident. There is no demonstrable renal or ureteral calculus. Urinary bladder is midline with wall thickness within normal limits. Stomach/Bowel: There is fairly diffuse stool throughout the colon. Mild thickening of the gastric antrum may in part be due to incomplete distention. There is no small or large bowel wall or mesenteric thickening. There is lipomatous infiltration of the ileocecal valve. There is no bowel obstruction. No free air or portal venous air. Vascular/Lymphatic: There is no appreciable abdominal aortic aneurysm. There are scattered foci of atherosclerotic calcification in the distal aorta and common iliac arteries. Major mesenteric vessels appear patent. There is no appreciable adenopathy in the abdomen or pelvis. Reproductive: Uterus is anteverted. There is no pelvic mass or pelvic fluid collection. Other: Appendix is not appreciable. There is no periappendiceal region inflammation. There is no ascites or abscess in the abdomen or pelvis. Musculoskeletal: There is degenerative change in the lumbar spine. There is also degenerative change in the right hip with subchondral cysts on each side of the joint. There are no well-defined blastic or lytic bone lesions. There is  no intramuscular abdominal wall lesion. IMPRESSION: Chest CT: Multiple irregular pulmonary nodular opacities scattered throughout the right lung as well as more scattered to a lesser extent on the left. There is extensive ground-glass type opacity throughout the lungs as well, primarily but not exclusively in the lower lobes. There is tree-in-bud type appearance throughout much of the right upper lobe. The tree-in-bud appearance in the right upper lobe is quite consistent with underlying pneumonia. Suspect that all of these changes may be due to underlying pneumonia, although a somewhat atypical presentation of neoplasm is a differential consideration. Advise Pulmonary Medicine consultation with consideration for bronchoscopy given the constellation of findings present. Single mildly prominent lymph node just to the right of the carina anteriorly. No other lymph node enlargement is appreciable. There are scattered foci of coronary artery calcification. There is a fairly small hiatal hernia. CT abdomen and pelvis: Subtle enhancement in the anterior upper to mid left kidney, concerning for a mass. This lesion measures 1.5 x 1.1 cm. Would  advise pre and post-contrast renal MR to further assess. Early diffuse stool throughout colon. No bowel obstruction. No abscess. No bowel wall thickening. No adenopathy in the abdomen or pelvis. Hepatic steatosis. Mild wall thickening in the mid stomach is probably due to incomplete distention. A degree of gastritis cannot be excluded, however. Aortic atherosclerosis. Electronically Signed   By: Lowella Grip III M.D.   On: 10/24/2015 13:52   Ct Abdomen Pelvis W Contrast  10/24/2015  CLINICAL DATA:  Weight loss and chronic weakness for 6 months EXAM: CT CHEST, ABDOMEN, AND PELVIS WITH CONTRAST TECHNIQUE: Multidetector CT imaging of the chest, abdomen and pelvis was performed following the standard protocol during bolus administration of intravenous contrast. Oral contrast was  also administered for the CT abdomen and pelvis portions. CONTRAST:  162m ISOVUE-300 IOPAMIDOL (ISOVUE-300) INJECTION 61% COMPARISON:  Chest radiograph September 29, 2015 FINDINGS: CT CHEST FINDINGS Mediastinum/Lymph Nodes: Thyroid appears normal. There is a single lymph node just to the right of the carina anteriorly measuring 1.5 x 1.0 cm. No other lymph node prominence is evident in the chest region. There is a fairly small focal hiatal hernia. There is atherosclerotic calcification in the aorta. There is no demonstrable thoracic aortic aneurysm or dissection. The visualized great vessels appear unremarkable. The left and right common carotid arteries arise as a common trunk, an anatomic variant. There are scattered foci of coronary artery calcification. The pericardium is not appreciably thickened. There is no major vessel pulmonary embolus evident. Lungs/Pleura: There is extensive ground-glass type opacity throughout the lungs. There are multiple irregular nodular opacities throughout the lungs, more on the right than on the left. Nodular opacities range in size from as small as 3 mm to as large as 1.3 x 1.1 cm. There is patchy confluence in both lower lobes with associated ground-glass type opacity in these areas. There is tree-in-bud type appearance in the right upper lobe. Musculoskeletal: There are no blastic or lytic bone lesions. There is degenerative change in the thoracic spine. CT ABDOMEN PELVIS FINDINGS Hepatobiliary: There is hepatic steatosis. No focal liver lesions are evident. The gallbladder is contracted without appreciable gallbladder wall thickening. There is no biliary duct dilatation appreciable. Pancreas: No pancreatic mass or inflammatory focus. Spleen: No splenic lesions are evident. Adrenals/Urinary Tract: Adrenals appear unremarkable bilaterally. There is a 7 x 7 mm cyst in the anterior upper to mid left kidney. On delayed phase images, there is subtle enhancement in the anterior mid left  kidney measuring 1.5 x 1.1 cm, best appreciated on axial slice 19 series 3 a 1. No other renal lesions are evident. There is no demonstrable renal or ureteral calculus. Urinary bladder is midline with wall thickness within normal limits. Stomach/Bowel: There is fairly diffuse stool throughout the colon. Mild thickening of the gastric antrum may in part be due to incomplete distention. There is no small or large bowel wall or mesenteric thickening. There is lipomatous infiltration of the ileocecal valve. There is no bowel obstruction. No free air or portal venous air. Vascular/Lymphatic: There is no appreciable abdominal aortic aneurysm. There are scattered foci of atherosclerotic calcification in the distal aorta and common iliac arteries. Major mesenteric vessels appear patent. There is no appreciable adenopathy in the abdomen or pelvis. Reproductive: Uterus is anteverted. There is no pelvic mass or pelvic fluid collection. Other: Appendix is not appreciable. There is no periappendiceal region inflammation. There is no ascites or abscess in the abdomen or pelvis. Musculoskeletal: There is degenerative change in the lumbar spine.  There is also degenerative change in the right hip with subchondral cysts on each side of the joint. There are no well-defined blastic or lytic bone lesions. There is no intramuscular abdominal wall lesion. IMPRESSION: Chest CT: Multiple irregular pulmonary nodular opacities scattered throughout the right lung as well as more scattered to a lesser extent on the left. There is extensive ground-glass type opacity throughout the lungs as well, primarily but not exclusively in the lower lobes. There is tree-in-bud type appearance throughout much of the right upper lobe. The tree-in-bud appearance in the right upper lobe is quite consistent with underlying pneumonia. Suspect that all of these changes may be due to underlying pneumonia, although a somewhat atypical presentation of neoplasm is a  differential consideration. Advise Pulmonary Medicine consultation with consideration for bronchoscopy given the constellation of findings present. Single mildly prominent lymph node just to the right of the carina anteriorly. No other lymph node enlargement is appreciable. There are scattered foci of coronary artery calcification. There is a fairly small hiatal hernia. CT abdomen and pelvis: Subtle enhancement in the anterior upper to mid left kidney, concerning for a mass. This lesion measures 1.5 x 1.1 cm. Would advise pre and post-contrast renal MR to further assess. Early diffuse stool throughout colon. No bowel obstruction. No abscess. No bowel wall thickening. No adenopathy in the abdomen or pelvis. Hepatic steatosis. Mild wall thickening in the mid stomach is probably due to incomplete distention. A degree of gastritis cannot be excluded, however. Aortic atherosclerosis. Electronically Signed   By: Lowella Grip III M.D.   On: 10/24/2015 13:52       ASSESSMENT / PLAN:  Acute hypoxic respiratory failure - has had associated intermittent cough along with fevers, generalized weakness / fatigue. 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  ANCA negative 10/24/2015 but ESR 100  - unclear etiology at this point. Unchagnd situation  Plan: Continue supplemental O2 as needed to maintain SpO2 > 92%. Check quantiferon gold Check ACE, , ssa, ssb, scl-70,, CCP and LDH Await ANA Recommend BAL - ideall Lower lobe for old preocess and UlL/ML fo new procss if blood work does not give a cluse If BAL non-diagnostic need VATS bx  Updated her and son; in agreement with plan     Dr. Brand Males, M.D., [facility].C.P Pulmonary and Critical Care Medicine Staff Physician [facility] Pager: [phone], If no answer or between  15:00h - 7:00h: call  [phone]  10/25/2015 1:15 PM

## 2015-10-25 NOTE — Progress Notes (Signed)
Inpatient Rehabilitation  OT is recommending IP Rehab.  At this time, pt's Samantha AuerbachCigna is unlikely to authorize IP rehab admission based on her current diagnoses.  We will follow at a distance as her medical work up continues for possible appropriateness for CIR.  Please call if questions.  Weldon PickingSusan Brook Mall PT Inpatient Rehab Admissions Coordinator Cell [phone] Office [phone]

## 2015-10-26 DIAGNOSIS — R627 Adult failure to thrive: Secondary | ICD-10-CM

## 2015-10-26 LAB — CULTURE, RESPIRATORY W GRAM STAIN: Culture: NORMAL

## 2015-10-26 LAB — CBC
HCT: 28.6 % — ABNORMAL LOW (ref 36.0–46.0)
HEMOGLOBIN: 9.3 g/dL — AB (ref 12.0–15.0)
MCH: 29.2 pg (ref 26.0–34.0)
MCHC: 32.5 g/dL (ref 30.0–36.0)
MCV: 89.9 fL (ref 78.0–100.0)
PLATELETS: 330 10*3/uL (ref 150–400)
RBC: 3.18 MIL/uL — ABNORMAL LOW (ref 3.87–5.11)
RDW: 14.8 % (ref 11.5–15.5)
WBC: 3.7 10*3/uL — ABNORMAL LOW (ref 4.0–10.5)

## 2015-10-26 LAB — BASIC METABOLIC PANEL
Anion gap: 8 (ref 5–15)
BUN: 10 mg/dL (ref 6–20)
CALCIUM: 8.7 mg/dL — AB (ref 8.9–10.3)
CHLORIDE: 98 mmol/L — AB (ref 101–111)
CO2: 29 mmol/L (ref 22–32)
CREATININE: 1.07 mg/dL — AB (ref 0.44–1.00)
GFR, EST NON AFRICAN AMERICAN: 57 mL/min — AB (ref >60)
Glucose, Bld: 103 mg/dL — ABNORMAL HIGH (ref 65–99)
Potassium: 3.9 mmol/L (ref 3.5–5.1)
SODIUM: 135 mmol/L (ref 135–145)

## 2015-10-26 LAB — HIV 1/2 AB DIFFERENTIATION
HIV 1 Ab: POSITIVE — AB
HIV 2 Ab: NEGATIVE

## 2015-10-26 LAB — CYCLIC CITRUL PEPTIDE ANTIBODY, IGG/IGA: CCP ANTIBODIES IGG/IGA: 40 U — AB (ref 0–19)

## 2015-10-26 LAB — PROTIME-INR
INR: 1.08 (ref 0.00–1.49)
INR: 1.11 (ref 0.00–1.49)
PROTHROMBIN TIME: 14.5 s (ref 11.6–15.2)
Prothrombin Time: 14.2 seconds (ref 11.6–15.2)

## 2015-10-26 LAB — ANGIOTENSIN CONVERTING ENZYME: Angiotensin-Converting Enzyme: 70 U/L (ref 14–82)

## 2015-10-26 LAB — HIV ANTIBODY (ROUTINE TESTING W REFLEX)

## 2015-10-26 LAB — MAGNESIUM: MAGNESIUM: 1.9 mg/dL (ref 1.7–2.4)

## 2015-10-26 LAB — CULTURE, RESPIRATORY

## 2015-10-26 LAB — APTT
APTT: 32 s (ref 24–37)
aPTT: 20 seconds — ABNORMAL LOW (ref 24–37)

## 2015-10-26 LAB — SJOGRENS SYNDROME-A EXTRACTABLE NUCLEAR ANTIBODY

## 2015-10-26 LAB — ANCA TITERS

## 2015-10-26 LAB — SJOGRENS SYNDROME-B EXTRACTABLE NUCLEAR ANTIBODY

## 2015-10-26 LAB — RHEUMATOID FACTOR: RHEUMATOID FACTOR: 11.8 [IU]/mL (ref 0.0–13.9)

## 2015-10-26 LAB — ANTI-DNA ANTIBODY, DOUBLE-STRANDED: ds DNA Ab: <1 IU/mL (ref 0–9)

## 2015-10-26 LAB — TSH: TSH: 1.022 u[IU]/mL (ref 0.350–4.500)

## 2015-10-26 LAB — GLOMERULAR BASEMENT MEMBRANE ANTIBODIES: GBM Ab: 6 units (ref 0–20)

## 2015-10-26 LAB — ANTI-SCLERODERMA ANTIBODY: Scleroderma (Scl-70) (ENA) Antibody, IgG: <0.2 AI (ref 0.0–0.9)

## 2015-10-26 MED ORDER — PREDNISONE 20 MG PO TABS
40.0000 mg | ORAL_TABLET | Freq: Two times a day (BID) | ORAL | Status: DC
  Administered 2015-10-26 – 2015-10-31 (×10): 40 mg via ORAL
  Filled 2015-10-26 (×10): qty 2

## 2015-10-26 MED ORDER — SULFAMETHOXAZOLE-TRIMETHOPRIM 400-80 MG/5ML IV SOLN
300.0000 mg | Freq: Three times a day (TID) | INTRAVENOUS | Status: DC
  Administered 2015-10-26 – 2015-10-31 (×14): 300 mg via INTRAVENOUS
  Filled 2015-10-26 (×25): qty 18.8

## 2015-10-26 NOTE — Progress Notes (Addendum)
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 Given her presentation would be highly concerned for possible PCP pneumonia  My understanding is that she is planned for bronhscopy with BAL tomorrow and if this is indeed done would send for PCP.  At MINIMUM I would recommend starting IV bactrim and steroids post bronchoscopy  Certainly one could consider empirically starting them tonight especially if she worsens.  I have added her to the ID Consult list and ask Dr. Megan Salon to see her tomorrow.  I am ordering HIV ultraquant reflex to genotype along with Hep panel, HLA B701  If she indeed has PCP she should be started on ARV while an inpatient   ADDENDUM: Discussed case with Dr. Titus Mould and we felt that proceeding with immediate treatment for empiric PCP with IV bactrim and steroids made much more sense than proceeding with bronchoscopy  I am ordering IV bactrim per pharmacy and BID prednisone.  I am DC levaquin.  I would have low threshold to get rid of her lasix as well given we are going to be giving her high dose TMP/SMX  Dr Titus Mould will cancel bronchoscopy for am.     Rhina Brackett Dam 10/26/2015, 8:14 PM

## 2015-10-26 NOTE — Progress Notes (Signed)
Physical Therapy Treatment Patient Details Name: Samantha Kline MRN: [id number] DOB: 1960-12-06 Today's Date: 10/26/2015    History of Present Illness Mrs. Samantha Kline is a 55yo woman who presented [facility] the [facility] for evaluation of cough, fever, progressive weakness, and fatigue.The patient has had unexplained weight loss, fatigue, decreased appetite with food aversion, and shortness of breath for several months. CT chest probable PNA, lesion on left kidney concerning for mass    PT Comments    Patient tolerated gait training 4464ft with 3L O2 supplemental O2 with SpO2 88-98% throughout session. Pt educated on breathing technique. Pt continues to refuse SNF. Recommend HHPT for further skilled PT services to maximize independence and safety with mobility.   Follow Up Recommendations  Home health PT;Supervision/Assistance - 24 hour     Equipment Recommendations   (pt currently refusing use of assistive device)    Recommendations for Other Services       Precautions / Restrictions Precautions Precautions: Fall Precaution Comments: monitor O2 Restrictions Weight Bearing Restrictions: No    Mobility  Bed Mobility               General bed mobility comments: OOB in chair upon arrival  Transfers Overall transfer level: Needs assistance Equipment used: None Transfers: Sit to/from Stand Sit to Stand: Min guard         General transfer comment: min guard for safety; safe hand placement  Ambulation/Gait Ambulation/Gait assistance: Min guard;Min assist Ambulation Distance (Feet): 64 Feet Assistive device: None Gait Pattern/deviations: Step-through pattern;Decreased stride length;Narrow base of support Gait velocity: very slow pattern   General Gait Details: slow, unsteady gait with with LOB X2 with min A required to recover; cues for increased bilat step length, increased cadence, and breathing technique; one standing rest break with SpO2 88% on 3L O2 via nasal  canula   Stairs            Wheelchair Mobility    Modified Rankin (Stroke Patients Only)       Balance     Sitting balance-Leahy Scale: Good       Standing balance-Leahy Scale: Fair                      Cognition Arousal/Alertness: Awake/alert Behavior During Therapy: Flat affect Overall Cognitive Status: Within Functional Limits for tasks assessed                      Exercises      General Comments General comments (skin integrity, edema, etc.): SpO2 90% end of session at rest on 3L O2      Pertinent Vitals/Pain Pain Assessment: No/denies pain    Home Living                      Prior Function            PT Goals (current goals can now be found in the care plan section) Acute Rehab PT Goals Patient Stated Goal: be able to be active and independent again PT Goal Formulation: With patient Time For Goal Achievement: 11/08/15 Potential to Achieve Goals: Good Progress towards PT goals: Progressing toward goals    Frequency  Min 3X/week    PT Plan Discharge plan needs to be updated    Co-evaluation             End of Session Equipment Utilized During Treatment: Gait belt;Oxygen Activity Tolerance: Patient limited by fatigue Patient left: in  chair;with call bell/phone within reach;with nursing/sitter in room     Time: 1429-1456 PT Time Calculation (min) (ACUTE ONLY): 27 min  Charges:  $Gait Training: 8-22 mins $Therapeutic Activity: 8-22 mins                    G Codes:      Derek MoundKellyn R Charly Hunton Alys Dulak, PTA Pager: [phone]   10/26/2015, 3:06 PM

## 2015-10-26 NOTE — Progress Notes (Signed)
Pharmacy Antibiotic Note  Samantha Kline is a 55 y.o. female admitted on 10/24/2015 with pneumocystis pneumonia.  Pharmacy has been consulted for IV Bactrim dosing. New HIV alert flag  Plan: IV Bactrim 300 mg iv Q 8 hours Follow up progress, Scr, cultures  Height: 5\' 4"  (162.6 cm) Weight: 127 lb 1.6 oz (57.652 kg) IBW/kg (Calculated) : 54.7  Temp (24hrs), Avg:98.6 F (37 C), Min:98.2 F (36.8 C), Max:98.8 F (37.1 C)   Recent Labs Lab 10/24/15 0310 10/24/15 0319 10/24/15 0746 10/24/15 1044 10/25/15 0538 10/26/15 0717  WBC 5.6  --   --   --  4.4 3.7*  CREATININE 0.86  --   --   --  0.71 1.07*  LATICACIDVEN  --  1.05 0.6 1.1  --   --     Estimated Creatinine Clearance: 50.7 mL/min (by C-G formula based on Cr of 1.07).    No Known Allergies  Thank you for allowing pharmacy to be a part of this patient's care. Okey RegalLisa Alanie Syler, PharmD  10/26/2015 8:48 PM

## 2015-10-26 NOTE — Progress Notes (Signed)
Name: Samantha Kline MRN: [id number] DOB: [birth year]    ADMISSION DATE:  10/24/2015 CONSULTATION DATE:  10/24/15  REFERRING MD :  Eulas [facility])  CHIEF COMPLAINT:  Fever, Weight Loss, Weakness   SUBJECTIVE/OVERNIGHT/INTERVAL HX:  Denies SOB, pain.  Resting comfortably.   VITAL SIGNS: Temp:  [98.2 F (36.8 C)-98.8 F (37.1 C)] 98.8 F (37.1 C) (07/05 0634) Pulse Rate:  [86-103] 86 (07/05 0634) Resp:  [16-19] 18 (07/05 0634) BP: (109-112)/(59-68) 111/68 mmHg (07/05 0634) SpO2:  [96 %-100 %] 98 % (07/05 0935) 2LNC  PHYSICAL EXAMINATION: General: cachectic, low muscle mass, just off toilet Neuro:awake, A ox 4 HEENT: jvd wnl Cardiovascular: s1 s2 RRR Lungs: CTA Abdomen: soft, scaphoid, NT, nd Musculoskeletal: no edema Skin: no rash, skin dry    PULMONARY No results for input(s): PHART, PCO2ART, PO2ART, HCO3, TCO2, O2SAT in the last 168 hours.  Invalid input(s): PCO2, PO2  CBC  Recent Labs Lab 10/24/15 0310 10/25/15 0538 10/26/15 0717  HGB 9.9* 8.9* 9.3*  HCT 29.4* 27.4* 28.6*  WBC 5.6 4.4 3.7*  PLT 324 319 330    COAGULATION No results for input(s): INR in the last 168 hours.  CARDIAC    Recent Labs Lab 10/24/15 0310 10/24/15 0844 10/24/15 1442 10/24/15 2003  TROPONINI 0.03* <0.03 <0.03 <0.03   No results for input(s): PROBNP in the last 168 hours.   CHEMISTRY  Recent Labs Lab 10/24/15 0310 10/25/15 0538 10/26/15 0717  NA 134* 134* 135  K 3.1* 3.5 3.9  CL 99* 99* 98*  CO2 _0 GLUCOSE 124* 95 103*  BUN _1 CREATININE 0.86 0.71 1.07*  CALCIUM 8.3* 8.3* 8.7*  MG  --   --  1.9   Estimated Creatinine Clearance: 50.7 mL/min (by C-G formula based on Cr of 1.07).   LIVER  Recent Labs Lab 10/24/15 0310 10/25/15 0538  AST 36 34  ALT 13* 14  ALKPHOS 53 42  BILITOT 0.8 0.4  PROT 7.6 6.8  ALBUMIN 2.4* 2.0*     INFECTIOUS  Recent Labs Lab 10/24/15 0319 10/24/15 0746 10/24/15 1044  LATICACIDVEN 1.05 0.6 1.1      ENDOCRINE CBG (last 3)  No results for input(s): GLUCAP in the last 72 hours.    IMAGING x48h  - image(s) personally visualized  -   highlighted in bold Ct Chest W Contrast  10/24/2015  CLINICAL DATA:  Weight loss and chronic weakness for 6 months EXAM: CT CHEST, ABDOMEN, AND PELVIS WITH CONTRAST TECHNIQUE: Multidetector CT imaging of the chest, abdomen and pelvis was performed following the standard protocol during bolus administration of intravenous contrast. Oral contrast was also administered for the CT abdomen and pelvis portions. CONTRAST:  178m ISOVUE-300 IOPAMIDOL (ISOVUE-300) INJECTION 61% COMPARISON:  Chest radiograph September 29, 2015 FINDINGS: CT CHEST FINDINGS Mediastinum/Lymph Nodes: Thyroid appears normal. There is a single lymph node just to the right of the carina anteriorly measuring 1.5 x 1.0 cm. No other lymph node prominence is evident in the chest region. There is a fairly small focal hiatal hernia. There is atherosclerotic calcification in the aorta. There is no demonstrable thoracic aortic aneurysm or dissection. The visualized great vessels appear unremarkable. The left and right common carotid arteries arise as a common trunk, an anatomic variant. There are scattered foci of coronary artery calcification. The pericardium is not appreciably thickened. There is no major vessel pulmonary embolus evident. Lungs/Pleura: There is extensive ground-glass type opacity throughout the lungs. There are multiple irregular nodular opacities throughout  the lungs, more on the right than on the left. Nodular opacities range in size from as small as 3 mm to as large as 1.3 x 1.1 cm. There is patchy confluence in both lower lobes with associated ground-glass type opacity in these areas. There is tree-in-bud type appearance in the right upper lobe. Musculoskeletal: There are no blastic or lytic bone lesions. There is degenerative change in the thoracic spine. CT ABDOMEN PELVIS FINDINGS  Hepatobiliary: There is hepatic steatosis. No focal liver lesions are evident. The gallbladder is contracted without appreciable gallbladder wall thickening. There is no biliary duct dilatation appreciable. Pancreas: No pancreatic mass or inflammatory focus. Spleen: No splenic lesions are evident. Adrenals/Urinary Tract: Adrenals appear unremarkable bilaterally. There is a 7 x 7 mm cyst in the anterior upper to mid left kidney. On delayed phase images, there is subtle enhancement in the anterior mid left kidney measuring 1.5 x 1.1 cm, best appreciated on axial slice 19 series 3 a 1. No other renal lesions are evident. There is no demonstrable renal or ureteral calculus. Urinary bladder is midline with wall thickness within normal limits. Stomach/Bowel: There is fairly diffuse stool throughout the colon. Mild thickening of the gastric antrum may in part be due to incomplete distention. There is no small or large bowel wall or mesenteric thickening. There is lipomatous infiltration of the ileocecal valve. There is no bowel obstruction. No free air or portal venous air. Vascular/Lymphatic: There is no appreciable abdominal aortic aneurysm. There are scattered foci of atherosclerotic calcification in the distal aorta and common iliac arteries. Major mesenteric vessels appear patent. There is no appreciable adenopathy in the abdomen or pelvis. Reproductive: Uterus is anteverted. There is no pelvic mass or pelvic fluid collection. Other: Appendix is not appreciable. There is no periappendiceal region inflammation. There is no ascites or abscess in the abdomen or pelvis. Musculoskeletal: There is degenerative change in the lumbar spine. There is also degenerative change in the right hip with subchondral cysts on each side of the joint. There are no well-defined blastic or lytic bone lesions. There is no intramuscular abdominal wall lesion. IMPRESSION: Chest CT: Multiple irregular pulmonary nodular opacities scattered  throughout the right lung as well as more scattered to a lesser extent on the left. There is extensive ground-glass type opacity throughout the lungs as well, primarily but not exclusively in the lower lobes. There is tree-in-bud type appearance throughout much of the right upper lobe. The tree-in-bud appearance in the right upper lobe is quite consistent with underlying pneumonia. Suspect that all of these changes may be due to underlying pneumonia, although a somewhat atypical presentation of neoplasm is a differential consideration. [facility] consultation with consideration for bronchoscopy given the constellation of findings present. Single mildly prominent lymph node just to the right of the carina anteriorly. No other lymph node enlargement is appreciable. There are scattered foci of coronary artery calcification. There is a fairly small hiatal hernia. CT abdomen and pelvis: Subtle enhancement in the anterior upper to mid left kidney, concerning for a mass. This lesion measures 1.5 x 1.1 cm. Would advise pre and post-contrast renal MR to further assess. Early diffuse stool throughout colon. No bowel obstruction. No abscess. No bowel wall thickening. No adenopathy in the abdomen or pelvis. Hepatic steatosis. Mild wall thickening in the mid stomach is probably due to incomplete distention. A degree of gastritis cannot be excluded, however. Aortic atherosclerosis. Electronically Signed   By: Lowella Grip III M.D.   On: 10/24/2015  13:52   Ct Abdomen Pelvis W Contrast  10/24/2015  CLINICAL DATA:  Weight loss and chronic weakness for 6 months EXAM: CT CHEST, ABDOMEN, AND PELVIS WITH CONTRAST TECHNIQUE: Multidetector CT imaging of the chest, abdomen and pelvis was performed following the standard protocol during bolus administration of intravenous contrast. Oral contrast was also administered for the CT abdomen and pelvis portions. CONTRAST:  1102m ISOVUE-300 IOPAMIDOL (ISOVUE-300) INJECTION  61% COMPARISON:  Chest radiograph September 29, 2015 FINDINGS: CT CHEST FINDINGS Mediastinum/Lymph Nodes: Thyroid appears normal. There is a single lymph node just to the right of the carina anteriorly measuring 1.5 x 1.0 cm. No other lymph node prominence is evident in the chest region. There is a fairly small focal hiatal hernia. There is atherosclerotic calcification in the aorta. There is no demonstrable thoracic aortic aneurysm or dissection. The visualized great vessels appear unremarkable. The left and right common carotid arteries arise as a common trunk, an anatomic variant. There are scattered foci of coronary artery calcification. The pericardium is not appreciably thickened. There is no major vessel pulmonary embolus evident. Lungs/Pleura: There is extensive ground-glass type opacity throughout the lungs. There are multiple irregular nodular opacities throughout the lungs, more on the right than on the left. Nodular opacities range in size from as small as 3 mm to as large as 1.3 x 1.1 cm. There is patchy confluence in both lower lobes with associated ground-glass type opacity in these areas. There is tree-in-bud type appearance in the right upper lobe. Musculoskeletal: There are no blastic or lytic bone lesions. There is degenerative change in the thoracic spine. CT ABDOMEN PELVIS FINDINGS Hepatobiliary: There is hepatic steatosis. No focal liver lesions are evident. The gallbladder is contracted without appreciable gallbladder wall thickening. There is no biliary duct dilatation appreciable. Pancreas: No pancreatic mass or inflammatory focus. Spleen: No splenic lesions are evident. Adrenals/Urinary Tract: Adrenals appear unremarkable bilaterally. There is a 7 x 7 mm cyst in the anterior upper to mid left kidney. On delayed phase images, there is subtle enhancement in the anterior mid left kidney measuring 1.5 x 1.1 cm, best appreciated on axial slice 19 series 3 a 1. No other renal lesions are evident. There  is no demonstrable renal or ureteral calculus. Urinary bladder is midline with wall thickness within normal limits. Stomach/Bowel: There is fairly diffuse stool throughout the colon. Mild thickening of the gastric antrum may in part be due to incomplete distention. There is no small or large bowel wall or mesenteric thickening. There is lipomatous infiltration of the ileocecal valve. There is no bowel obstruction. No free air or portal venous air. Vascular/Lymphatic: There is no appreciable abdominal aortic aneurysm. There are scattered foci of atherosclerotic calcification in the distal aorta and common iliac arteries. Major mesenteric vessels appear patent. There is no appreciable adenopathy in the abdomen or pelvis. Reproductive: Uterus is anteverted. There is no pelvic mass or pelvic fluid collection. Other: Appendix is not appreciable. There is no periappendiceal region inflammation. There is no ascites or abscess in the abdomen or pelvis. Musculoskeletal: There is degenerative change in the lumbar spine. There is also degenerative change in the right hip with subchondral cysts on each side of the joint. There are no well-defined blastic or lytic bone lesions. There is no intramuscular abdominal wall lesion. IMPRESSION: Chest CT: Multiple irregular pulmonary nodular opacities scattered throughout the right lung as well as more scattered to a lesser extent on the left. There is extensive ground-glass type opacity throughout the lungs as  well, primarily but not exclusively in the lower lobes. There is tree-in-bud type appearance throughout much of the right upper lobe. The tree-in-bud appearance in the right upper lobe is quite consistent with underlying pneumonia. Suspect that all of these changes may be due to underlying pneumonia, although a somewhat atypical presentation of neoplasm is a differential consideration. [facility] Pulmonary Medicine consultation with consideration for bronchoscopy given the  constellation of findings present. Single mildly prominent lymph node just to the right of the carina anteriorly. No other lymph node enlargement is appreciable. There are scattered foci of coronary artery calcification. There is a fairly small hiatal hernia. CT abdomen and pelvis: Subtle enhancement in the anterior upper to mid left kidney, concerning for a mass. This lesion measures 1.5 x 1.1 cm. Would advise pre and post-contrast renal MR to further assess. Early diffuse stool throughout colon. No bowel obstruction. No abscess. No bowel wall thickening. No adenopathy in the abdomen or pelvis. Hepatic steatosis. Mild wall thickening in the mid stomach is probably due to incomplete distention. A degree of gastritis cannot be excluded, however. Aortic atherosclerosis. Electronically Signed   By: Lowella Grip III M.D.   On: 10/24/2015 13:52     ASSESSMENT / PLAN:  Acute hypoxic respiratory failure - has had associated intermittent cough along with fevers, generalized weakness / fatigue. And 50lb unintential weight loss in 6 months.  CTA chest from 4/19 demonstrated bilateral GGO's of unclear etiology.  Repeat CT chest 7/3 again demonstrated extensive GGO's bilaterally along with tree in bud type features in RUL.  This appears to be and raises suspicion for infectious process. ANCA, C3 / C4, RF, ACE all negative. ESR 106, LDH 327.  Additional auto-immune workup, quant gold, HIV pending. Plan: Continue supplemental O2 as needed to maintain SpO2 > 92%. F/u on quantiferon gold, ssa, ssb, scl-70,CCP, ANA. Plan to perform FOB with BAL in AM 7/6 at 11:30AM. NPO after midnight for above procedure. If BAL is non-diagnostic, then will need VATS. Would recommend ID consult.  Rest per primary team.   Montey Hora, PA - [facility] Pager: [phone]  or [phone] 10/26/2015, 12:30 PM    STAFF NOTE: I, Merrie Roof, MD FACP have personally reviewed  patient's available data, including medical history, events of note, physical examination and test results as part of my evaluation. I have discussed with resident/NP and other care providers such as pharmacist, RN and RRT. In addition, I personally evaluated patient and elicited key findings of:  It is unclear etiology nodules, infection possible, MAC, fungal, bactereial, r/o malignancy, so far autoimmune neg, esr nonspecfic, have planned bronch with BAL in lab thur at 1130 am, I have consented her personally, we need ot r/o infection, if negative then move forward for further BX nodules on Ct chest, consider further MRI of lesion kidney although also small, assess coags today pre procedure , npo for procedure midnight   Lavon Paganini. Titus Mould, MD, National Pgr: [facility] 10/26/2015 5:02 PM

## 2015-10-26 NOTE — Progress Notes (Signed)
PROGRESS NOTE  Samantha Kline AYT:[id number] DOB: January 17, 1961 DOA: 10/24/2015 PCP: No PCP Per Patient   Assessment/Plan: Active Problems:   PNA (pneumonia)   Generalized weakness   Anemia   Weight loss   Malnourished (HCC)   Acute hypoxemic respiratory failure (HCC)   Bilateral pneumonia   FTT (failure to thrive) in adult   Hypokalemia   Malnutrition of moderate degree    Acute respiratory failure with hypoxia, unknown etiology at this time. Chest x-ray shows hazy bilateral air space opacification, which may reflect persistent or recurrent groundglass opacification is noted on prior CTA 07/2015, suspicious for pulmonary edema, pneumonitis or possible pulmonary alveolar hemorrhage. Tmax 101. Received IVF 1l NS and IV Vanc/Zosyn x1. Lactic acid 1. 5. EKG sinus tachycardia and LVH, QTc406. Hb 9.9  Suppportive care with oxygen as needed -Scheduled DuoNeb with prn every 4 hours pr -Respiratory therapy with spirometry and pulm. toilet -Autoimmune so far unremarkable, workup showing negative ANCA, C3/C4 -Pulmonary critical care medicine involved, plan for bronchoscopy on 10/27/2015. They recommend pursuing VATS in the event BAL is negative  Iron deficiency anemia/ Generalized weakness, chronic/progressive weight loss  unknown etiology at this time, in the setting of anemia, abnormal chest imaging. Last colonoscopy 2010 normal . Hb 9.9 , Hcult negative Albumin 2.4 . Glu 124 gative . Weight loss 50 lbs since April 2017. Patient has been on and off Iron supplementation, not recently due to constipation issues  Iron panel with iron deficiency, stool FOBT negative, ua with few rbc, started iron supplement No indication for blood transfusion currently, continue monitor hgb Consult PT/OT/Nutrition consult -Labs on 10/26/2015 showing stable hemoglobin of 9.3 and hematocrit of 20.6 -Plan for bronchoscopy on 10/27/2015  Hypokalemia, may be nutritional. EKG sinus tachycardia and LVH, QTc406 Current  K 3.1 on admission -On 10/26/2015 a.m. lab work showing potassium of 3.9 after getting replacement   DVT prophylaxis: SCD's  Code Status: Full  Family Communication:  Disposition Plan: Plan for bronchoscopy in a.m. Consults called: Pulmonary   Procedures:  none  Antibiotics:  levaquin   Objective: BP 109/65 mmHg  Pulse 92  Temp(Src) 98.6 F (37 C) (Oral)  Resp 18  Ht 5\' 4"  (1.626 m)  Wt 57.652 kg (127 lb 1.6 oz)  BMI 21.81 kg/m2  SpO2 95%  Intake/Output Summary (Last 24 hours) at 10/26/15 1646 Last data filed at 10/26/15 1141  Gross per 24 hour  Intake    240 ml  Output    200 ml  Net     40 ml   Filed Weights   10/24/15 0306 10/24/15 0654  Weight: 68.04 kg (150 lb) 57.652 kg (127 lb 1.6 oz)    Exam:   General:  Frail and underweight, chronically ill-appearing  Cardiovascular: RRR  Respiratory: bibasilar crackles, good air movement,  Abdomen: Soft/ND/NT, positive BS  Musculoskeletal: No Edema  Neuro: aaox3  Data Reviewed: Basic Metabolic Panel:  Recent Labs Lab 10/24/15 0310 10/25/15 0538 10/26/15 0717  NA 134* 134* 135  K 3.1* 3.5 3.9  CL 99* 99* 98*  CO2 26 28 29   GLUCOSE 124* 95 103*  BUN 12 8 10   CREATININE 0.86 0.71 1.07*  CALCIUM 8.3* 8.3* 8.7*  MG  --   --  1.9   Liver Function Tests:  Recent Labs Lab 10/24/15 0310 10/25/15 0538  AST 36 34  ALT 13* 14  ALKPHOS 53 42  BILITOT 0.8 0.4  PROT 7.6 6.8  ALBUMIN 2.4* 2.0*   No results for input(s):  LIPASE, AMYLASE in the last 168 hours.  Recent Labs Lab 10/24/15 0319  AMMONIA 26   CBC:  Recent Labs Lab 10/24/15 0310 10/25/15 0538 10/26/15 0717  WBC 5.6 4.4 3.7*  NEUTROABS 4.5 3.6  --   HGB 9.9* 8.9* 9.3*  HCT 29.4* 27.4* 28.6*  MCV 89.1 89.0 89.9  PLT 324 319 330   Cardiac Enzymes:    Recent Labs Lab 10/24/15 0310 10/24/15 0844 10/24/15 1442 10/24/15 2003  TROPONINI 0.03* <0.03 <0.03 <0.03   BNP (last 3 results)  Recent Labs   08/09/15 2320 10/24/15 0310  BNP 12.2 45.8    ProBNP (last 3 results) No results for input(s): PROBNP in the last 8760 hours.  CBG: No results for input(s): GLUCAP in the last 168 hours.  Recent Results (from the past 240 hour(s))  Blood culture (routine x 2)     Status: None (Preliminary result)   Collection Time: 10/24/15  3:10 AM  Result Value Ref Range Status   Specimen Description BLOOD LEFT ARM  Final   Special Requests BOTTLES DRAWN AEROBIC AND ANAEROBIC 5 ML  Final   Culture   Final    NO GROWTH 2 DAYS [facility]    Report Status PENDING  Incomplete  Blood culture (routine x 2)     Status: None (Preliminary result)   Collection Time: 10/24/15  3:19 AM  Result Value Ref Range Status   Specimen Description BLOOD RIGHT ARM  Final   Special Requests BOTTLES DRAWN AEROBIC AND ANAEROBIC 5 ML  Final   Culture   Final    NO GROWTH 2 DAYS Performed at [facility]    Report Status PENDING  Incomplete  Culture, sputum-assessment     Status: None   Collection Time: 10/24/15 12:10 PM  Result Value Ref Range Status   Specimen Description SPUTUM  Final   Special Requests NONE  Final   Sputum evaluation   Final    THIS SPECIMEN IS ACCEPTABLE. RESPIRATORY CULTURE REPORT TO FOLLOW.   Report Status 10/24/2015 FINAL  Final  Culture, respiratory (NON-Expectorated)     Status: None   Collection Time: 10/24/15 12:10 PM  Result Value Ref Range Status   Specimen Description SPUTUM  Final   Special Requests NONE  Final   Gram Stain   Final    ABUNDANT WBC PRESENT, PREDOMINANTLY PMN RARE SQUAMOUS EPITHELIAL CELLS PRESENT FEW GRAM POSITIVE COCCI IN PAIRS AND CHAINS RARE GRAM NEGATIVE RODS RARE GRAM POSITIVE RODS    Culture Consistent with normal respiratory flora.  Final   Report Status 10/26/2015 FINAL  Final     Studies: No results found.  Scheduled Meds: . feeding supplement (ENSURE ENLIVE)  237 mL Oral BID BM  . ferrous sulfate  325 mg Oral  Q breakfast  . ipratropium-albuterol  3 mL Nebulization TID  . levofloxacin (LEVAQUIN) IV  750 mg Intravenous Q24H  . vitamin B-12  100 mcg Oral Daily    Continuous Infusions: . sodium chloride 10 mL/hr at 10/24/15 1020     Time spent: 35mins  Jeralyn BennettZAMORA, Alixander Rallis MD  [facility] Pager [phone]. If 7PM-7AM, please contact night-coverage at [email or web address], password [email or web address] 10/26/2015, 4:46 PM  LOS: 2 days

## 2015-10-27 ENCOUNTER — Encounter (HOSPITAL_COMMUNITY)
Admission: EM | Disposition: A | Discharge status: Home Health | Payer: Self-pay | Source: Home / Self Care | Attending: Internal Medicine

## 2015-10-27 ENCOUNTER — Encounter (HOSPITAL_COMMUNITY): Payer: Managed Care, Other (non HMO)

## 2015-10-27 DIAGNOSIS — J189 Pneumonia, unspecified organism: Secondary | ICD-10-CM | POA: Diagnosis present

## 2015-10-27 DIAGNOSIS — B20 Human immunodeficiency virus [HIV] disease: Principal | ICD-10-CM

## 2015-10-27 LAB — HIV 1/2 AB DIFFERENTIATION
HIV 1 AB: POSITIVE — AB
HIV 2 Ab: NEGATIVE

## 2015-10-27 LAB — FANA STAINING PATTERNS

## 2015-10-27 LAB — QUANTIFERON IN TUBE
QFT TB AG MINUS NIL VALUE: 0 IU/mL
QUANTIFERON MITOGEN VALUE: 0.06 IU/mL
QUANTIFERON NIL VALUE: 0.04 [IU]/mL
QUANTIFERON TB AG VALUE: 0.04 [IU]/mL
QUANTIFERON TB GOLD: UNDETERMINED

## 2015-10-27 LAB — BASIC METABOLIC PANEL
ANION GAP: 6 (ref 5–15)
BUN: 10 mg/dL (ref 6–20)
CHLORIDE: 96 mmol/L — AB (ref 101–111)
CO2: 28 mmol/L (ref 22–32)
Calcium: 8.5 mg/dL — ABNORMAL LOW (ref 8.9–10.3)
Creatinine, Ser: 0.69 mg/dL (ref 0.44–1.00)
Glucose, Bld: 244 mg/dL — ABNORMAL HIGH (ref 65–99)
POTASSIUM: 4 mmol/L (ref 3.5–5.1)
SODIUM: 130 mmol/L — AB (ref 135–145)

## 2015-10-27 LAB — QUANTIFERON TB GOLD ASSAY (BLOOD)

## 2015-10-27 LAB — CBC
HEMATOCRIT: 27 % — AB (ref 36.0–46.0)
HEMOGLOBIN: 8.7 g/dL — AB (ref 12.0–15.0)
MCH: 28.7 pg (ref 26.0–34.0)
MCHC: 32.2 g/dL (ref 30.0–36.0)
MCV: 89.1 fL (ref 78.0–100.0)
Platelets: 312 10*3/uL (ref 150–400)
RBC: 3.03 MIL/uL — AB (ref 3.87–5.11)
RDW: 14.6 % (ref 11.5–15.5)
WBC: 2.5 10*3/uL — AB (ref 4.0–10.5)

## 2015-10-27 LAB — HIV ANTIBODY (ROUTINE TESTING W REFLEX)

## 2015-10-27 LAB — ANTINUCLEAR ANTIBODIES, IFA: ANTINUCLEAR ANTIBODIES, IFA: POSITIVE — AB

## 2015-10-27 SURGERY — VIDEO BRONCHOSCOPY WITHOUT FLUORO
Anesthesia: Moderate Sedation | Laterality: Bilateral

## 2015-10-27 MED ORDER — LEVOFLOXACIN 500 MG PO TABS
500.0000 mg | ORAL_TABLET | Freq: Every day | ORAL | Status: DC
  Administered 2015-10-27 – 2015-10-28 (×2): 500 mg via ORAL
  Filled 2015-10-27 (×2): qty 1

## 2015-10-27 NOTE — Progress Notes (Signed)
Name: Samantha Kline MRN: [id number] DOB: [birth year]    ADMISSION DATE:  10/24/2015 CONSULTATION DATE:  10/24/15  REFERRING MD :  [facility])  CHIEF COMPLAINT:  Fever, Weight Loss, Weakness   SUBJECTIVE/OVERNIGHT/INTERVAL HX:  No acute events overnight.  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  I relayed this information to pt who was understandably in shock. IV bactrim and PO pred ordered by ID already.  They will formerly see pt in consultation today 7/6.   VITAL SIGNS: Temp:  [97.6 F (36.4 C)-98.6 F (37 C)] 97.6 F (36.4 C) (07/06 0601) Pulse Rate:  [66-92] 66 (07/06 0601) Resp:  [18] 18 (07/06 0601) BP: (109-125)/(65-80) 125/80 mmHg (07/06 0601) SpO2:  [92 %-100 %] 96 % (07/06 0822) 2LNC  PHYSICAL EXAMINATION: General: cachectic, in NAD Neuro: A&O x 3, no focal deficits HEENT: jvd wnl Cardiovascular: s1 s2 RRR Lungs: Coarse crackles bilaterally Abdomen: soft, NT, nd Musculoskeletal: no edema Skin: no rash, skin dry    PULMONARY No results for input(s): PHART, PCO2ART, PO2ART, HCO3, TCO2, O2SAT in the last 168 hours.  Invalid input(s): PCO2, PO2  CBC  Recent Labs Lab 10/25/15 0538 10/26/15 0717 10/27/15 0718  HGB 8.9* 9.3* 8.7*  HCT 27.4* 28.6* 27.0*  WBC 4.4 3.7* 2.5*  PLT 319 330 312    COAGULATION  Recent Labs Lab 10/26/15 1156 10/26/15 1345  INR 1.08 1.11    CARDIAC    Recent Labs Lab 10/24/15 0310 10/24/15 0844 10/24/15 1442 10/24/15 2003  TROPONINI 0.03* <0.03 <0.03 <0.03   No results for input(s): PROBNP in the last 168 hours.   CHEMISTRY  Recent Labs Lab 10/24/15 0310 10/25/15 0538 10/26/15 0717 10/27/15 0718  NA 134* 134* 135 130*  K 3.1* 3.5 3.9 4.0  CL 99* 99* 98* 96*  CO2 '26 28 29 28  '$ GLUCOSE 124* 95 103* 244*  BUN '12 8 10 10  '$ CREATININE 0.86 0.71 1.07* 0.69  CALCIUM 8.3* 8.3* 8.7* 8.5*  MG  --   --  1.9  --    Estimated Creatinine Clearance: 67.8 mL/min (by C-G  formula based on Cr of 0.69).   LIVER  Recent Labs Lab 10/24/15 0310 10/25/15 0538 10/26/15 1156 10/26/15 1345  AST 36 34  --   --   ALT 13* 14  --   --   ALKPHOS 53 42  --   --   BILITOT 0.8 0.4  --   --   PROT 7.6 6.8  --   --   ALBUMIN 2.4* 2.0*  --   --   INR  --   --  1.08 1.11     INFECTIOUS  Recent Labs Lab 10/24/15 0319 10/24/15 0746 10/24/15 1044  LATICACIDVEN 1.05 0.6 1.1     ENDOCRINE CBG (last 3)  No results for input(s): GLUCAP in the last 72 hours.    IMAGING x48h  - image(s) personally visualized  -   highlighted in bold No results found.   ASSESSMENT / PLAN:  Acute hypoxic respiratory failure - has had associated intermittent cough along with fevers, generalized weakness / fatigue and 50lb unintential weight loss in 6 months.  CTA chest from 4/19 demonstrated bilateral GGO's of unclear etiology.  Repeat CT chest 7/3 again demonstrated extensive GGO's bilaterally along with tree in bud type features in RUL.  This appears to be and raises suspicion for infectious process. ANCA, C3 / C4, RF, ACE, SSA / SSB, anti  Scl-70 all negative. ESR 106, LDH 327, CCP 40. Additional auto-immune workup, quant gold. 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 Plan: Continue supplemental O2 as needed to maintain SpO2 > 92%. F/u on quantiferon gold, ANA. Empiric IV bactrim + PO prednisone already started by ID. ID to see pt today, appreciate the assistance. FOB with BAL cancelled for now, will consider if no improvement with above PCP treatment.  Rest per primary team.   Montey Hora, PA - [facility] Pager: [phone]  or [phone] 10/27/2015, 10:44 AM  STAFF NOTE: I, Merrie Roof, MD FACP have personally reviewed patient's available data, including medical history, events of note, physical examination and test results as part of my evaluation. I have discussed with resident/NP  and other care providers such as pharmacist, RN and RRT. In addition, I personally evaluated patient and elicited key findings of: no distress, coarse, HIV pos, await cd4, CT as I was concerned about was c/w infection concerns, although nodules not classic for PCP, int changes can be, would treat empiric PCP for 5-7 days and hold off bronch invasive with risk, if not improving with Bactrim and steroids would thenn bronch bal for sure for other etiology infectious, will re visit Monday , call if worsening status, steroids classic 21 day treatment course   Lavon Paganini. Titus Mould, MD, [place] Pgr: [facility] 10/27/2015 2:18 PM

## 2015-10-27 NOTE — Consult Note (Signed)
[facility] for Infectious Disease    Date of Admission:  10/24/2015   Total days of antibiotics: 4        Day 1 Bactrim        Levaquin (7/3-7/5)       Reason for Consult: Newly diagnosed HIV    Referring Physician: Kelvin Cellar Primary Care Physician: Not established  Principal Problem:   Human immunodeficiency virus (HIV) disease ([place]) Active Problems:   Anemia   Weight loss   Malnourished ([place])   Acute hypoxemic respiratory failure ([place])   FTT (failure to thrive) in adult   CAP (community acquired pneumonia)   . feeding supplement (ENSURE ENLIVE)  237 mL Oral BID BM  . ferrous sulfate  325 mg Oral Q breakfast  . ipratropium-albuterol  3 mL Nebulization TID  . predniSONE  40 mg Oral BID  . sulfamethoxazole-trimethoprim  300 mg Intravenous Q8H  . vitamin B-12  100 mcg Oral Daily    Recommendations: 1. Continue trimethoprim-sulfamethoxazole plus prednisone 2. Check for RPR/GC/chlamydia 3. Expectorated sputum culture for PCP 4. Hepatitis panel including chronic HepB surface Ab, HepB surface Ag, HepC  Assessment: Community pneumonia: Samantha Kline presents with an extremely characteristic history for pneumocystic pneumonia with profound weight loss, fatigue, and slowly progressive dyspnea starting months before her hospitalization. Lack of infiltrative disease on chest radiography suggests against a severe infection with a typical pneumococcal organism. Sputum culture will be useful as an alternate or simultaneous infection with other organisms is also possible. She is maintaining oxygenation well and is on appropriate treatment with Bactrim and oral glucocorticoids. We will need to assess her oxygenation with light exertion in her room since this disease generally causes a prominent oxygen diffusion capacity defect. She will also benefit from relatively early initiation of antiretrovirals within 1-3 weeks since PCP generally does not lead to complications from  IRIS.  Newly diagnosed HIV: She is very unfortunately newly identified in an advanced stage of her disease with marked lymphopenia and opportunistic pneumonia. Her disease is further complicated with a chronic malnutrition and anemia over the past months to year. HIV viral load, genotype, CD4 count, and [id number] assays are underway for workup in anticipation of starting early treatment. We will need to screen for co-infections with RPR/GC/chlamydia or risks for reactivation with hepatitis B or C.  This is obviously a major new diagnosis and was discussed with the patient only briefly today as she seemed very shocked and anxious. We will be following up with her during this hospital course and will obviously need good connections for longitudinal care.    HPI: Samantha Kline is a 55 y.o. female with a medical history of shingles, anemia, and pneumonia presenting after a 6-12 month period of progressive fatigue and unintentional weight loss of up to 50 pounds. She has seen a primary care provider in [facility] last year for these symptoms and taking iron supplementation for a mild normocytic anemia. Her symptoms have worsened with progressive cough and dyspnea which was evaluated at the ED in 07/2015. This has persisted but now she returned to the ED with more severe dyspnea, fevers, and progressive weakness over about the past week.  Her initial workup included repeat chest CT redemonstrating ground glass opacities in bilateral lung bases and empiric pneumonia treatment was started with levaquin. A strep pneumo urine antigen was checked and positive from 7/3 but blood, urine, and sputum cultures show no growth to date. HIV PCR resulted  positive on 7/5 so ID service was consulted and pneumonia treatment empirically changed for PCP given her identified risk factors and clinical presentation.   Review of Systems: Review of Systems  Constitutional: Positive for fever, weight loss and malaise/fatigue.  Eyes:  Negative for blurred vision.  Respiratory: Positive for cough and shortness of breath. Negative for hemoptysis and sputum production.   Cardiovascular: Negative for chest pain and leg swelling.  Gastrointestinal: Positive for diarrhea. Negative for nausea.  Genitourinary: Negative for dysuria and hematuria.  Musculoskeletal: Negative for myalgias.  Skin: Negative for rash.  Neurological: Positive for weakness. Negative for dizziness and headaches.  Psychiatric/Behavioral: The patient is nervous/anxious.     Past Medical History  Diagnosis Date  . Anemia   . Vitamin D deficiency   . Pneumonia 10/24/2015    Social History  Substance Use Topics  . Smoking status: Never Smoker   . Smokeless tobacco: Never Used  . Alcohol Use: Yes     Comment: 10/24/2015 "nothing since the 1980s"    History reviewed. No pertinent family history. No Known Allergies  OBJECTIVE: Blood pressure 130/72, pulse 94, temperature 98.2 F (36.8 C), temperature source Oral, resp. rate 16, height _0  (1.626 m), weight 57.652 kg (127 lb 1.6 oz), SpO2 96 %.  Physical Exam GENERAL- malnourished, old for age appearing woman 68- No cervical LAN, Moist oral mucosa, hair appears brittle  CARDIAC- RRR, no murmurs, rubs or gallops RESP- Crackles over bilateral lung bases ABDOMEN- Thin, nontender, no guarding or rebound, normoactive bowel sounds present NEURO- Strength upper and lower extremities- 5/5, Sensation intact globally EXTREMITIES- symmetric, no pedal edema SKIN- Warm, dry, No rash or lesion PSYCH- Flat affect, appropriate speech   Lab Results Lab Results  Component Value Date   WBC 2.5* 10/27/2015   HGB 8.7* 10/27/2015   HCT 27.0* 10/27/2015   MCV 89.1 10/27/2015   PLT 312 10/27/2015    Lab Results  Component Value Date   CREATININE 0.69 10/27/2015   BUN 10 10/27/2015   NA 130* 10/27/2015   K 4.0 10/27/2015   CL 96* 10/27/2015   CO2 28 10/27/2015    Lab Results  Component Value Date    ALT 14 10/25/2015   AST 34 10/25/2015   ALKPHOS 42 10/25/2015   BILITOT 0.4 10/25/2015     Microbiology: Recent Results (from the past 240 hour(s))  Blood culture (routine x 2)     Status: None (Preliminary result)   Collection Time: 10/24/15  3:10 AM  Result Value Ref Range Status   Specimen Description BLOOD LEFT ARM  Final   Special Requests BOTTLES DRAWN AEROBIC AND ANAEROBIC 5 ML  Final   Culture   Final    NO GROWTH 3 DAYS Performed at [facility]    Report Status PENDING  Incomplete  Blood culture (routine x 2)     Status: None (Preliminary result)   Collection Time: 10/24/15  3:19 AM  Result Value Ref Range Status   Specimen Description BLOOD RIGHT ARM  Final   Special Requests BOTTLES DRAWN AEROBIC AND ANAEROBIC 5 ML  Final   Culture   Final    NO GROWTH 3 DAYS Performed at [facility]    Report Status PENDING  Incomplete  Culture, sputum-assessment     Status: None   Collection Time: 10/24/15 12:10 PM  Result Value Ref Range Status   Specimen Description SPUTUM  Final   Special Requests NONE  Final   Sputum evaluation  Final    THIS SPECIMEN IS ACCEPTABLE. RESPIRATORY CULTURE REPORT TO FOLLOW.   Report Status 10/24/2015 FINAL  Final  Culture, respiratory (NON-Expectorated)     Status: None   Collection Time: 10/24/15 12:10 PM  Result Value Ref Range Status   Specimen Description SPUTUM  Final   Special Requests NONE  Final   Gram Stain   Final    ABUNDANT WBC PRESENT, PREDOMINANTLY PMN RARE SQUAMOUS EPITHELIAL CELLS PRESENT FEW GRAM POSITIVE COCCI IN PAIRS AND CHAINS RARE GRAM NEGATIVE RODS RARE GRAM POSITIVE RODS    Culture Consistent with normal respiratory flora.  Final   Report Status 10/26/2015 FINAL  Final    Collier Salina, MD PGY-II Internal Medicine Resident Pager# [phone] 10/27/2015, 5:26 PM

## 2015-10-27 NOTE — Progress Notes (Signed)
PROGRESS NOTE  Samantha Kline UYQ:[id number] DOB: Jun 20, 1960 DOA: 10/24/2015 PCP: No PCP Per Patient   Assessment/Plan: Active Problems:   PNA (pneumonia)   Generalized weakness   Anemia   Weight loss   Malnourished (HCC)   Acute hypoxemic respiratory failure (HCC)   Bilateral pneumonia   FTT (failure to thrive) in adult   Hypokalemia   Malnutrition of moderate degree   Human immunodeficiency virus (HIV) disease (HCC)   Acute respiratory failure with hypoxia likely secondary to PCP -Chest x-ray shows hazy bilateral air space opacification, which may reflect persistent or recurrent groundglass opacification is noted on prior CTA 07/2015, suspicious for pulmonary edema, pneumonitis or possible pulmonary alveolar hemorrhage.  -Lab work showing positive HIV -High suspicion for PCP, infectious disease consulted, I spoke with Dr Orvan Falconerampbell  -Started on Bactrim IV every 8 hours along with prednisone ordered milligrams by mouth twice a day. -Plan for a total of 3 weeks of Bactrim therapy. -Will monitor her response, follow oxygen saturations. Pulmonary critical care medicine recommended holding off on bronchoscopy for now, would consider this intervention should she show failure to improve  HIV/AIDS -Mrs Samantha Kline coming back positive for HIV, high suspicion for PCP for which she was started on Bactrim and prednisone -I spoke with Dr. Orvan Falconerampbell of infectious disease on 10/27/2015, she will get established at the infectious disease clinic. We also discussed having future discussions with her husband and having him tested as well -Viral load and a CD4 count pending  Iron deficiency anemia/ Generalized weakness, chronic/progressive weight loss -Likely secondary to HIV/AIDS -Viral low-density 4 count pending -She will get established at the infectious disease clinic with plans to initiate antiretroviral therapy -Receiving treatment for probable PCP  Hypokalemia, may be nutritional. EKG sinus  tachycardia and LVH, QTc406 Current K 3.1 on admission -On 10/26/2015 a.m. lab work showing potassium of 3.9 after getting replacement   DVT prophylaxis: SCD's  Code Status: Full  Family Communication:  Disposition Plan: Anticipate discharge home when medically stable possibly in the next 3 days Consults called: Pulmonary critical care medicine/infectious disease   Procedures:  none  Antibiotics:  levaquin   Objective: BP 130/72 mmHg  Pulse 94  Temp(Src) 98.2 F (36.8 C) (Oral)  Resp 16  Ht 5\' 4"  (1.626 m)  Wt 57.652 kg (127 lb 1.6 oz)  BMI 21.81 kg/m2  SpO2 96%  Intake/Output Summary (Last 24 hours) at 10/27/15 1434 Last data filed at 10/27/15 1259  Gross per 24 hour  Intake  787.5 ml  Output   1875 ml  Net -1087.5 ml   Filed Weights   10/24/15 0306 10/24/15 0654  Weight: 68.04 kg (150 lb) 57.652 kg (127 lb 1.6 oz)    Exam:   General:  Frail and underweight, chronically ill-appearing, cachectic  Cardiovascular: RRR  Respiratory: bibasilar crackles and crepitations  Abdomen: Soft/ND/NT, positive BS  Musculoskeletal: No Edema  Neuro: aaox3  Data Reviewed: Basic Metabolic Panel:  Recent Labs Lab 10/24/15 0310 10/25/15 0538 10/26/15 0717 10/27/15 0718  NA 134* 134* 135 130*  K 3.1* 3.5 3.9 4.0  CL 99* 99* 98* 96*  CO2 26 28 29 28   GLUCOSE 124* 95 103* 244*  BUN 12 8 10 10   CREATININE 0.86 0.71 1.07* 0.69  CALCIUM 8.3* 8.3* 8.7* 8.5*  MG  --   --  1.9  --    Liver Function Tests:  Recent Labs Lab 10/24/15 0310 10/25/15 0538  AST 36 34  ALT 13* 14  ALKPHOS 53  42  BILITOT 0.8 0.4  PROT 7.6 6.8  ALBUMIN 2.4* 2.0*   No results for input(s): LIPASE, AMYLASE in the last 168 hours.  Recent Labs Lab 10/24/15 0319  AMMONIA 26   CBC:  Recent Labs Lab 10/24/15 0310 10/25/15 0538 10/26/15 0717 10/27/15 0718  WBC 5.6 4.4 3.7* 2.5*  NEUTROABS 4.5 3.6  --   --   HGB 9.9* 8.9* 9.3* 8.7*  HCT 29.4* 27.4* 28.6* 27.0*   MCV 89.1 89.0 89.9 89.1  PLT 324 319 330 312   Cardiac Enzymes:    Recent Labs Lab 10/24/15 0310 10/24/15 0844 10/24/15 1442 10/24/15 2003  TROPONINI 0.03* <0.03 <0.03 <0.03   BNP (last 3 results)  Recent Labs  08/09/15 2320 10/24/15 0310  BNP 12.2 45.8    ProBNP (last 3 results) No results for input(s): PROBNP in the last 8760 hours.  CBG: No results for input(s): GLUCAP in the last 168 hours.  Recent Results (from the past 240 hour(s))  Blood culture (routine x 2)     Status: None (Preliminary result)   Collection Time: 10/24/15  3:10 AM  Result Value Ref Range Status   Specimen Description BLOOD LEFT ARM  Final   Special Requests BOTTLES DRAWN AEROBIC AND ANAEROBIC 5 ML  Final   Culture   Final    NO GROWTH 2 DAYS [facility]    Report Status PENDING  Incomplete  Blood culture (routine x 2)     Status: None (Preliminary result)   Collection Time: 10/24/15  3:19 AM  Result Value Ref Range Status   Specimen Description BLOOD RIGHT ARM  Final   Special Requests BOTTLES DRAWN AEROBIC AND ANAEROBIC 5 ML  Final   Culture   Final    NO GROWTH 2 DAYS [facility]    Report Status PENDING  Incomplete  Culture, sputum-assessment     Status: None   Collection Time: 10/24/15 12:10 PM  Result Value Ref Range Status   Specimen Description SPUTUM  Final   Special Requests NONE  Final   Sputum evaluation   Final    THIS SPECIMEN IS ACCEPTABLE. RESPIRATORY CULTURE REPORT TO FOLLOW.   Report Status 10/24/2015 FINAL  Final  Culture, respiratory (NON-Expectorated)     Status: None   Collection Time: 10/24/15 12:10 PM  Result Value Ref Range Status   Specimen Description SPUTUM  Final   Special Requests NONE  Final   Gram Stain   Final    ABUNDANT WBC PRESENT, PREDOMINANTLY PMN RARE SQUAMOUS EPITHELIAL CELLS PRESENT FEW GRAM POSITIVE COCCI IN PAIRS AND CHAINS RARE GRAM NEGATIVE RODS RARE GRAM POSITIVE RODS    Culture  Consistent with normal respiratory flora.  Final   Report Status 10/26/2015 FINAL  Final     Studies: No results found.  Scheduled Meds: . feeding supplement (ENSURE ENLIVE)  237 mL Oral BID BM  . ferrous sulfate  325 mg Oral Q breakfast  . ipratropium-albuterol  3 mL Nebulization TID  . predniSONE  40 mg Oral BID  . sulfamethoxazole-trimethoprim  300 mg Intravenous Q8H  . vitamin B-12  100 mcg Oral Daily    Continuous Infusions: . sodium chloride 10 mL/hr at 10/24/15 1020     Time spent: 35 mins  Jeralyn BennettZAMORA, Torria Fromer MD  [facility] Pager [phone]. If 7PM-7AM, please contact night-coverage at [email or web address], password [email or web address] 10/27/2015, 2:34 PM  LOS: 3 days

## 2015-10-27 NOTE — Progress Notes (Signed)
Occupational Therapy Treatment Patient Details Name: Samantha Kline MRN: [id number] DOB: [birth year] Today's Date: 10/27/2015    History of present illness Mrs. Samantha Kline is a 55yo woman who [facility] [place] [facility] for evaluation of cough, fever, progressive weakness, and fatigue.The patient has had unexplained weight loss, fatigue, decreased appetite with food aversion, and shortness of breath for several months. CT chest probable PNA, lesion on left kidney concerning for mass   OT comments  Pt making good progress towards ADL goals. Pt able to complete seated LB ADLs and grooming tasks standing with min guard assist. Pt demonstrated decreased initiation and difficulty sequencing through ADLs tasks today and required step-by-step instructions to complete. [facility] unlikely due to medical diagnoses. Will continue to follow acutely to address OT needs and goals.   Follow Up Recommendations  Home health OT;Supervision/Assistance - 24 hour (if CIR continues to be denied)    Equipment Recommendations  Other (comment) (TBD at next venue)    Recommendations for Other Services      Precautions / Restrictions Precautions Precautions: Fall Precaution Comments: monitor O2 Restrictions Weight Bearing Restrictions: No       Mobility Bed Mobility Overal bed mobility: Needs Assistance Bed Mobility: Supine to Sit     Supine to sit: Supervision     General bed mobility comments: Supervision for safety. HOB flat, no use of bedrails. No physical assist required.  Transfers Overall transfer level: Needs assistance Equipment used: Rolling walker (2 wheeled) Transfers: Sit to/from Stand Sit to Stand: Min guard         General transfer comment: Min guard for safety. VCs for sequencing and safe hand placement. Pt approached recliner with RW at angle and stated "What do I do now?" despite cues to sit down in recliner, pt required  step-by-step instructions for which way to turn and assist to move RW.    Balance Overall balance assessment: Needs assistance Sitting-balance support: No upper extremity supported;Feet supported Sitting balance-Leahy Scale: Good     Standing balance support: Bilateral upper extremity supported;During functional activity Standing balance-Leahy Scale: Fair Standing balance comment: Able to maintain balance without UE support for static standing tasks at sink                   ADL Overall ADL's : Needs assistance/impaired     Grooming: Oral care;Wash/dry hands;Min guard;Standing   Upper Body Bathing: Supervision/ safety;Sitting   Lower Body Bathing: Min guard;Sit to/from stand   Upper Body Dressing : Supervision/safety;Sitting   Lower Body Dressing: Min guard;Sit to/from stand   Toilet Transfer: Min guard;Cueing for safety;Ambulation;RW Toilet Transfer Details (indicate cue type and reason): simulated to chair Toileting- Clothing Manipulation and Hygiene: Min guard;Sit to/from stand       Functional mobility during ADLs: Min guard;Rolling walker General ADL Comments: Difficulty sequencing this session, required VCs.      Vision                     Perception     Praxis      Cognition   Behavior During Therapy: Flat affect Overall Cognitive Status: Impaired/Different from baseline Area of Impairment: Problem solving;Safety/judgement;Following commands        Following Commands: Follows one step commands with increased time Safety/Judgement: Decreased awareness of safety;Decreased awareness of deficits   Problem Solving: Slow processing;Difficulty sequencing;Requires verbal cues General Comments: Would approach sink/recliner at an angle and not know what to do  next. Pt would state "What should I do now?" Required cues for initiation and sequencing.    Extremity/Trunk Assessment               Exercises     Shoulder Instructions        General Comments      Pertinent Vitals/ Pain       Pain Assessment: No/denies pain  Home Living                                          Prior Functioning/Environment              Frequency Min 2X/week     Progress Toward Goals  OT Goals(current goals can now be found in the care plan section)  Progress towards OT goals: Progressing toward goals  Acute Rehab OT Goals Patient Stated Goal: be able to be active and independent again OT Goal Formulation: With patient Time For Goal Achievement: 11/08/15 Potential to Achieve Goals: Good ADL Goals Pt Will Perform Grooming: with set-up;with supervision;sitting;standing Pt Will Perform Upper Body Bathing: with set-up;with supervision;standing;sitting Pt Will Perform Lower Body Bathing: with set-up;with supervision;sit to/from stand Pt Will Transfer to Toilet: with min guard assist;ambulating;bedside commode Pt Will Perform Toileting - Clothing Manipulation and hygiene: with min guard assist;sit to/from stand Additional ADL Goal #1: Pt will use purse lipped breathing with S (min VCs) throughout session Additional ADL Goal #2: Pt and family will be aware of energy conservation stratigies from handout that would be of benefit to her  Plan Discharge plan remains appropriate    Co-evaluation                 End of Session Equipment Utilized During Treatment: Gait belt;Rolling walker;Oxygen   Activity Tolerance Patient tolerated treatment well   Patient Left in chair;with call bell/phone within reach;with chair alarm set   Nurse Communication Mobility status        Time: [id number] OT Time Calculation (min): 19 min  Charges: OT General Charges $OT Visit: 1 Procedure OT Treatments $Self Care/Home Management : 8-22 mins  Nils PyleJulia Hadiyah Maricle, OTR/L Pager: [phone] 10/27/2015, 3:36 PM

## 2015-10-27 NOTE — Progress Notes (Signed)
Physical Therapy Treatment Patient Details Name: Lorna FewHythia Decesare MRN: [id number] DOB: [birth year] Today's Date: 10/27/2015    History of Present Illness [name] is a 55yo woman who [facility] for evaluation of cough, fever, progressive weakness, and fatigue.The patient has had unexplained weight loss, fatigue, decreased appetite with food aversion, and shortness of breath for several months. CT chest probable PNA, lesion on left kidney concerning for mass    PT Comments    Patient is progressing toward mobility goals. Pt tolerated gait training with increased activity tolerance this session. SpO2 84% on RA with ambulation and ~1 min with 3L O2 and breathing technique to return to 95% end of session. Continue to progress as tolerated.   Follow Up Recommendations  Home health PT;Supervision/Assistance - 24 hour     Equipment Recommendations       Recommendations for Other Services       Precautions / Restrictions Precautions Precautions: Fall Precaution Comments: monitor O2 Restrictions Weight Bearing Restrictions: No    Mobility  Bed Mobility Overal bed mobility: Modified Independent                Transfers Overall transfer level: Needs assistance Equipment used: None Transfers: Sit to/from Stand Sit to Stand: Min guard         General transfer comment: min guard for safety  Ambulation/Gait Ambulation/Gait assistance: Min guard;Min assist Ambulation Distance (Feet): 200 Feet Assistive device: None Gait Pattern/deviations: Step-through pattern;Decreased stride length;Narrow base of support Gait velocity: very slow pattern   General Gait Details: decreased cadence; assist at times for guidance of RW; cues for sequencing of gait with use of AD, wider BOS, and forward gaze; SpO2 on RA for ~5855ft 84% and 3L O2 reapplied with SpO2 back to 95% after ~1 min of breathing technique   Stairs            Wheelchair Mobility    Modified  Rankin (Stroke Patients Only)       Balance     Sitting balance-Leahy Scale: Good       Standing balance-Leahy Scale: Fair                      Cognition Arousal/Alertness: Awake/alert Behavior During Therapy: Flat affect Overall Cognitive Status: Within Functional Limits for tasks assessed                      Exercises      General Comments        Pertinent Vitals/Pain      Home Living                      Prior Function            PT Goals (current goals can now be found in the care plan section) Acute Rehab PT Goals Patient Stated Goal: be able to be active and independent again PT Goal Formulation: With patient Time For Goal Achievement: 11/08/15 Potential to Achieve Goals: Good Progress towards PT goals: Progressing toward goals    Frequency  Min 3X/week    PT Plan Current plan remains appropriate    Co-evaluation             End of Session Equipment Utilized During Treatment: Gait belt;Oxygen Activity Tolerance: Patient tolerated treatment well Patient left: in chair;with call bell/phone within reach;with nursing/sitter in room     Time: 1328-1400 PT Time Calculation (min) (ACUTE ONLY):  32 min  Charges:  $Gait Training: 8-22 mins $Therapeutic Activity: 8-22 mins                    G Codes:      Derek MoundKellyn R Dawna Jakes Fordyce Lepak, PTA Pager: [phone]   10/27/2015, 3:21 PM

## 2015-10-28 DIAGNOSIS — E43 Unspecified severe protein-calorie malnutrition: Secondary | ICD-10-CM | POA: Diagnosis present

## 2015-10-28 DIAGNOSIS — E440 Moderate protein-calorie malnutrition: Secondary | ICD-10-CM

## 2015-10-28 LAB — CBC
HEMATOCRIT: 27 % — AB (ref 36.0–46.0)
HEMOGLOBIN: 9.2 g/dL — AB (ref 12.0–15.0)
MCH: 29.9 pg (ref 26.0–34.0)
MCHC: 34.1 g/dL (ref 30.0–36.0)
MCV: 87.7 fL (ref 78.0–100.0)
Platelets: 351 10*3/uL (ref 150–400)
RBC: 3.08 MIL/uL — AB (ref 3.87–5.11)
RDW: 14.4 % (ref 11.5–15.5)
WBC: 4.2 10*3/uL (ref 4.0–10.5)

## 2015-10-28 LAB — HEPATITIS PANEL, ACUTE
HCV AB: 0.2 {s_co_ratio} (ref 0.0–0.9)
Hep A IgM: NEGATIVE
Hep B C IgM: NEGATIVE
Hepatitis B Surface Ag: NEGATIVE

## 2015-10-28 LAB — BASIC METABOLIC PANEL
ANION GAP: 7 (ref 5–15)
BUN: 11 mg/dL (ref 6–20)
CHLORIDE: 96 mmol/L — AB (ref 101–111)
CO2: 27 mmol/L (ref 22–32)
Calcium: 8.6 mg/dL — ABNORMAL LOW (ref 8.9–10.3)
Creatinine, Ser: 0.82 mg/dL (ref 0.44–1.00)
GFR calc Af Amer: >60 mL/min (ref >60)
Glucose, Bld: 184 mg/dL — ABNORMAL HIGH (ref 65–99)
POTASSIUM: 4.5 mmol/L (ref 3.5–5.1)
SODIUM: 130 mmol/L — AB (ref 135–145)

## 2015-10-28 LAB — CD4/CD8 (T-HELPER/T-SUPPRESSOR CELL)
CD4 ABSOLUTE: 20 /uL — AB (ref 500–1900)
CD4%: 6 % — ABNORMAL LOW (ref 30.0–60.0)
CD8 T Cell Abs: 140 /uL — ABNORMAL LOW (ref 230–1000)
CD8TOX: 55 % — AB (ref 15.0–40.0)
RATIO: 0.11 — AB (ref 1.0–3.0)
Total lymphocyte count: 260 /uL — ABNORMAL LOW (ref 1000–4000)

## 2015-10-28 LAB — RPR: RPR: NONREACTIVE

## 2015-10-28 MED ORDER — IPRATROPIUM-ALBUTEROL 0.5-2.5 (3) MG/3ML IN SOLN: 3.0000 mL | RESPIRATORY_TRACT | Status: DC | PRN

## 2015-10-28 MED ORDER — IPRATROPIUM-ALBUTEROL 0.5-2.5 (3) MG/3ML IN SOLN
3.0000 mL | Freq: Two times a day (BID) | RESPIRATORY_TRACT | Status: DC
  Administered 2015-10-29 – 2015-10-31 (×5): 3 mL via RESPIRATORY_TRACT
  Filled 2015-10-28 (×5): qty 3

## 2015-10-28 MED ORDER — AZITHROMYCIN 600 MG PO TABS
1200.0000 mg | ORAL_TABLET | ORAL | Status: DC
  Administered 2015-10-29: 1200 mg via ORAL
  Filled 2015-10-28: qty 2

## 2015-10-28 MED ORDER — FLUCONAZOLE 100 MG PO TABS
400.0000 mg | ORAL_TABLET | Freq: Every day | ORAL | Status: DC
  Administered 2015-10-28: 400 mg via ORAL
  Filled 2015-10-28: qty 4

## 2015-10-28 MED ORDER — FLUCONAZOLE 100 MG PO TABS: 100.0000 mg | ORAL_TABLET | ORAL | Status: DC

## 2015-10-28 NOTE — Progress Notes (Signed)
[facility]    Date of Admission:  10/24/2015   Total days of antibiotics 5        Day 2 Bactrim        Day 5 Levaquin  Principal Problem:   Human immunodeficiency virus (HIV) disease (HCC) Active Problems:   CAP (community acquired pneumonia)   Anemia   Weight loss   Acute hypoxemic respiratory failure (HCC)   Malnutrition of moderate degree   . azithromycin  1,200 mg Oral Weekly  . feeding supplement (ENSURE ENLIVE)  237 mL Oral BID BM  . ferrous sulfate  325 mg Oral Q breakfast  . fluconazole  400 mg Oral Daily  . ipratropium-albuterol  3 mL Nebulization TID  . levofloxacin  500 mg Oral Daily  . predniSONE  40 mg Oral BID  . sulfamethoxazole-trimethoprim  300 mg Intravenous Q8H  . vitamin B-12  100 mcg Oral Daily    SUBJECTIVE: She feels like her breathing gets short at rest only when she gets anxious. She walked around briefly with PT yesterday and became hypoxic which quickly improved on supplemental oxygen at rest and taking deep breaths. She isn't coughing much and if so it is never productive.  Review of Systems: Review of Systems  Constitutional: Negative for fever.  Eyes: Negative for blurred vision.  Respiratory: Positive for cough and shortness of breath. Negative for sputum production.   Cardiovascular: Negative for chest pain.  Gastrointestinal: Positive for diarrhea.  Genitourinary: Negative for urgency.  Musculoskeletal: Negative for falls.  Skin: Negative for rash.  Neurological: Positive for dizziness. Negative for headaches.  Psychiatric/Behavioral: The patient is nervous/anxious.     Past Medical History  Diagnosis Date  . Anemia   . Vitamin D deficiency   . Pneumonia 10/24/2015    Social History  Substance Use Topics  . Smoking status: Never Smoker   . Smokeless tobacco: Never Used  . Alcohol Use: Yes     Comment: 10/24/2015 "nothing since the 1980s"    History reviewed. No pertinent family history. No  Known Allergies  OBJECTIVE: Filed Vitals:   10/27/15 2109 10/27/15 2118 10/28/15 0511 10/28/15 0920  BP: 138/87  129/83   Pulse: 96  84 85  Temp: 97.6 F (36.4 C)  97.6 F (36.4 C)   TempSrc: Oral  Oral   Resp: 18  19 18   Height:      Weight:      SpO2: 96% 96% 100% 100%   Body mass index is 21.81 kg/(m^2).  Physical Exam GENERAL- malnourished, old for age appearing woman HEENT- No cervical LAN, Moist oral mucosa, hair appears brittle  CARDIAC- RRR, no murmurs RESP- Crackles over bilateral lung bases EXTREMITIES- symmetric, no pedal edema SKIN- Warm, dry, No rash or lesion PSYCH- Flat affect  Lab Results Lab Results  Component Value Date   WBC 4.2 10/28/2015   HGB 9.2* 10/28/2015   HCT 27.0* 10/28/2015   MCV 87.7 10/28/2015   PLT 351 10/28/2015    Lab Results  Component Value Date   CREATININE 0.82 10/28/2015   BUN 11 10/28/2015   NA 130* 10/28/2015   K 4.5 10/28/2015   CL 96* 10/28/2015   CO2 27 10/28/2015    Lab Results  Component Value Date   ALT 14 10/25/2015   AST 34 10/25/2015   ALKPHOS 42 10/25/2015   BILITOT 0.4 10/25/2015     Microbiology: Recent Results (from the past 240 hour(s))  Blood culture (routine x 2)     Status: None (Preliminary result)   Collection Time: 10/24/15  3:10 AM  Result Value Ref Range Status   Specimen Description BLOOD LEFT ARM  Final   Special Requests BOTTLES DRAWN AEROBIC AND ANAEROBIC 5 ML  Final   Culture   Final    NO GROWTH 3 DAYS Performed [facility]    Report Status PENDING  Incomplete  Blood culture (routine x 2)     Status: None (Preliminary result)   Collection Time: 10/24/15  3:19 AM  Result Value Ref Range Status   Specimen Description BLOOD RIGHT ARM  Final   Special Requests BOTTLES DRAWN AEROBIC AND ANAEROBIC 5 ML  Final   Culture   Final    NO GROWTH 3 DAYS Performed at [facility]    Report Status PENDING  Incomplete  Culture, sputum-assessment     Status: None    Collection Time: 10/24/15 12:10 PM  Result Value Ref Range Status   Specimen Description SPUTUM  Final   Special Requests NONE  Final   Sputum evaluation   Final    THIS SPECIMEN IS ACCEPTABLE. RESPIRATORY CULTURE REPORT TO FOLLOW.   Report Status 10/24/2015 FINAL  Final  Culture, respiratory (NON-Expectorated)     Status: None   Collection Time: 10/24/15 12:10 PM  Result Value Ref Range Status   Specimen Description SPUTUM  Final   Special Requests NONE  Final   Gram Stain   Final    ABUNDANT WBC PRESENT, PREDOMINANTLY PMN RARE SQUAMOUS EPITHELIAL CELLS PRESENT FEW GRAM POSITIVE COCCI IN PAIRS AND CHAINS RARE GRAM NEGATIVE RODS RARE GRAM POSITIVE RODS    Culture Consistent with normal respiratory flora.  Final   Report Status 10/26/2015 FINAL  Final    ASSESSMENT: Newly diagnosed HIV/AIDS: Samantha Kline is a newly presenting patient who was found positive on a diagnostic test with HIV 1 with an absolute CD4 cell count of 20 already complicated by malnutrition, anemia, and likely opportunistic pneumonia. She is still having a hard time understanding her situation well. I reiterated to her again today that she has HIV underlying her presenting problems, and framed this in terms of requiring a lifelong treatment but with most likely a very good response and return to a normal quality of life in the future. Fortunately her other assays so far do not show a high risk for reactivation of an existing viral disease. In addition to treated a suspected PCP we will start prophylaxis on azithromycin and fluconazole for her severe immunodeficiency. Screening for potential accompanying STDs is still pending.  Community acquired pneumonia: this indolent pneumonia in a mostly interstitial radiographic pattern is still most suspicious for PCP so we will continue treating with bactrim and steroids. Sputum cultures are negative at this time and blood cultures as well. This also suggests against a typical  pneumococcal pneumonia. However with a positive urine pneumo antigen study, her ongoing hypoxia with simple activities, and no confirmation an alternative organism it is reasonable to continue levaquin.  PLAN: 1. Continue levaquin plus trimethprim-sulfamethoxazole and prednisone 2. Expectorated sputum for PCP when attainable 3. Start prophylaxis for MAC with azithromycin 1200mg  weekly and for mycoses with fluconazole 400mg  daily 4. Continuing daily mobilization would be useful in monitoring her progress  Fuller Planhristopher W Yeison Sippel, MD PGY-II Internal Medicine Resident Pager# [phone] 10/28/2015, 12:55 PM

## 2015-10-28 NOTE — Progress Notes (Signed)
Nutrition Follow-up  DOCUMENTATION CODES:   Non-severe (moderate) malnutrition in context of chronic illness  INTERVENTION:   -Continue Ensure Enlive po BID, each supplement provides 350 kcal and 20 grams of protein  NUTRITION DIAGNOSIS:   Malnutrition related to chronic illness as evidenced by mild depletion of body fat, mild depletion of muscle mass, percent weight loss.  Ongoing  GOAL:   Patient will meet greater than or equal to 90% of their needs  Progressing  MONITOR:   PO intake, Supplement acceptance, Labs, Weight trends, Skin, I & O's  REASON FOR ASSESSMENT:   Consult, Malnutrition Screening Tool Poor PO  ASSESSMENT:   Samantha Kline is a 55 y.o. female with medical history significant for anemia, Vit D deficiency presenting [facility] with progressive weakness over the last 6 months, worse over the last 2 days.  Per chart review, pt with newly diagnosed HIV infection.   Spoke with pt at bedside. She was sitting up in recliner and sipping on Ensure supplement at time of visit. Meal completion 50-100%. Pt reports appetite is still poor and ate a few bites of eggs this morning. Pt reports she enjoys the Ensure supplements and has been consuming them when provided. Encouraged pt to continue to consume supplement to assist with meeting nutritional goals.    Case discussed with RN. Bronchoscopy was cancelled. Pt taking Ensure supplements well.  Labs reviewed: Na: 130 (on IV supplementation).   Diet Order:  Diet regular Room service appropriate?: Yes; Fluid consistency:: Thin  Skin:  Reviewed, no issues  Last BM:  10/24/15  Height:   Ht Readings from Last 1 Encounters:  10/24/15 5\' 4"  (1.626 m)    Weight:   Wt Readings from Last 1 Encounters:  10/24/15 127 lb 1.6 oz (57.652 kg)    Ideal Body Weight:  54.5 kg  BMI:  Body mass index is 21.81 kg/(m^2).  Estimated Nutritional Needs:   Kcal:  1500-1700  Protein:  75-90 grams  Fluid:   1.5-1.7 L  EDUCATION NEEDS:   Education needs addressed  Jones Viviani A. Mayford KnifeWilliams, RD, LDN, CDE Pager: [phone] After hours Pager: [phone]

## 2015-10-28 NOTE — Progress Notes (Signed)
PROGRESS NOTE  Samantha Kline ZOX:[id number] DOB: 06-Jan-1961 DOA: 10/24/2015 PCP: No PCP Per Patient   Assessment/Plan: Principal Problem:   Human immunodeficiency virus (HIV) disease (HCC) Active Problems:   Anemia   Weight loss   Acute hypoxemic respiratory failure (HCC)   CAP (community acquired pneumonia)   Malnutrition of moderate degree   Acute respiratory failure with hypoxia likely secondary to PCP -Chest x-ray shows hazy bilateral air space opacification, which may reflect persistent or recurrent groundglass opacification is noted on prior CTA 07/2015, suspicious for pulmonary edema, pneumonitis or possible pulmonary alveolar hemorrhage.  -Lab work showing positive HIV -High suspicion for PCP, infectious disease consulted, I spoke with Dr Orvan Falconerampbell  -Started on Bactrim IV every 8 hours along with prednisone ordered milligrams by mouth twice a day. -Plan for a total of 3 weeks of Bactrim therapy along with prednisone taper. -On 10/28/2015 she showing significant clinical improvement, she was assisted out of bed to chair.   HIV/AIDS -Samantha Kline coming back positive for HIV, high suspicion for PCP for which she was started on Bactrim and prednisone -I spoke with Dr. Orvan Falconerampbell of infectious disease on 10/27/2015, she will get established at the infectious disease clinic. We also discussed having future discussions with her husband and having him tested as well -Lab work showing CD4 count of 20 -She was started on a azithromycin 1200 mg by mouth every weekly, Diflucan 100 mg by mouth every weekly and is receiving treatment for probable PCP.  -She will follow-up at the infectious disease clinic for initiation of antiretroviral therapy  Iron deficiency anemia/ Generalized weakness, chronic/progressive weight loss -Likely secondary to HIV/AIDS as well as PCP -On 10/28/2015 she was assisted out of bed to chair. Physical therapy consulted  Hypokalemia, may be nutritional. EKG sinus  tachycardia and LVH, QTc406 Current K 3.1 on admission -On 10/26/2015 a.m. lab work showing potassium of 3.9 after getting replacement   DVT prophylaxis: SCD's  Code Status: Full  Family Communication: Spoke with her sister and mother at bedside, I did not discuss HIV status with them Disposition Plan: Anticipate discharge home when medically stable possibly in the next 3 days Consults called: Pulmonary critical care medicine/infectious disease   Procedures:  none  Antibiotics:  levaquin  Bactrim   Objective: BP 127/73 mmHg  Pulse 111  Temp(Src) 97.8 F (36.6 C) (Oral)  Resp 16  Ht 5\' 4"  (1.626 m)  Wt 57.652 kg (127 lb 1.6 oz)  BMI 21.81 kg/m2  SpO2 98%  Intake/Output Summary (Last 24 hours) at 10/28/15 1651 Last data filed at 10/28/15 1644  Gross per 24 hour  Intake    560 ml  Output   3100 ml  Net  -2540 ml   Filed Weights   10/24/15 0306 10/24/15 0654  Weight: 68.04 kg (150 lb) 57.652 kg (127 lb 1.6 oz)    Exam:   General:  Frail and underweight, chronically ill-appearing, cachectic  Cardiovascular: RRR  Respiratory: bibasilar crackles and crepitations  Abdomen: Soft/ND/NT, positive BS  Musculoskeletal: No Edema  Neuro: aaox3  Data Reviewed: Basic Metabolic Panel:  Recent Labs Lab 10/24/15 0310 10/25/15 0538 10/26/15 0717 10/27/15 0718 10/28/15 0451  NA 134* 134* 135 130* 130*  K 3.1* 3.5 3.9 4.0 4.5  CL 99* 99* 98* 96* 96*  CO2 26 28 29 28 27   GLUCOSE 124* 95 103* 244* 184*  BUN 12 8 10 10 11   CREATININE 0.86 0.71 1.07* 0.69 0.82  CALCIUM 8.3* 8.3* 8.7* 8.5* 8.6*  MG  --   --  1.9  --   --    Liver Function Tests:  Recent Labs Lab 10/24/15 0310 10/25/15 0538  AST 36 34  ALT 13* 14  ALKPHOS 53 42  BILITOT 0.8 0.4  PROT 7.6 6.8  ALBUMIN 2.4* 2.0*   No results for input(s): LIPASE, AMYLASE in the last 168 hours.  Recent Labs Lab 10/24/15 0319  AMMONIA 26   CBC:  Recent Labs Lab 10/24/15 0310  10/25/15 0538 10/26/15 0717 10/27/15 0718 10/28/15 0451  WBC 5.6 4.4 3.7* 2.5* 4.2  NEUTROABS 4.5 3.6  --   --   --   HGB 9.9* 8.9* 9.3* 8.7* 9.2*  HCT 29.4* 27.4* 28.6* 27.0* 27.0*  MCV 89.1 89.0 89.9 89.1 87.7  PLT 324 319 330 312 351   Cardiac Enzymes:    Recent Labs Lab 10/24/15 0310 10/24/15 0844 10/24/15 1442 10/24/15 2003  TROPONINI 0.03* <0.03 <0.03 <0.03   BNP (last 3 results)  Recent Labs  08/09/15 2320 10/24/15 0310  BNP 12.2 45.8    ProBNP (last 3 results) No results for input(s): PROBNP in the last 8760 hours.  CBG: No results for input(s): GLUCAP in the last 168 hours.  Recent Results (from the past 240 hour(s))  Blood culture (routine x 2)     Status: None (Preliminary result)   Collection Time: 10/24/15  3:10 AM  Result Value Ref Range Status   Specimen Description BLOOD LEFT ARM  Final   Special Requests BOTTLES DRAWN AEROBIC AND ANAEROBIC 5 ML  Final   Culture   Final    NO GROWTH 3 DAYS [facility]    Report Status PENDING  Incomplete  Blood culture (routine x 2)     Status: None (Preliminary result)   Collection Time: 10/24/15  3:19 AM  Result Value Ref Range Status   Specimen Description BLOOD RIGHT ARM  Final   Special Requests BOTTLES DRAWN AEROBIC AND ANAEROBIC 5 ML  Final   Culture   Final    NO GROWTH 3 DAYS Performed at [facility]    Report Status PENDING  Incomplete  Culture, sputum-assessment     Status: None   Collection Time: 10/24/15 12:10 PM  Result Value Ref Range Status   Specimen Description SPUTUM  Final   Special Requests NONE  Final   Sputum evaluation   Final    THIS SPECIMEN IS ACCEPTABLE. RESPIRATORY CULTURE REPORT TO FOLLOW.   Report Status 10/24/2015 FINAL  Final  Culture, respiratory (NON-Expectorated)     Status: None   Collection Time: 10/24/15 12:10 PM  Result Value Ref Range Status   Specimen Description SPUTUM  Final   Special Requests NONE  Final   Gram Stain   Final     ABUNDANT WBC PRESENT, PREDOMINANTLY PMN RARE SQUAMOUS EPITHELIAL CELLS PRESENT FEW GRAM POSITIVE COCCI IN PAIRS AND CHAINS RARE GRAM NEGATIVE RODS RARE GRAM POSITIVE RODS    Culture Consistent with normal respiratory flora.  Final   Report Status 10/26/2015 FINAL  Final     Studies: No results found.  Scheduled Meds: . [START ON 10/29/2015] azithromycin  1,200 mg Oral Weekly  . feeding supplement (ENSURE ENLIVE)  237 mL Oral BID BM  . ferrous sulfate  325 mg Oral Q breakfast  . [START ON 11/04/2015] fluconazole  100 mg Oral Weekly  . ipratropium-albuterol  3 mL Nebulization TID  . predniSONE  40 mg Oral BID  . sulfamethoxazole-trimethoprim  300 mg Intravenous Q8H  . vitamin B-12  100 mcg Oral Daily    Continuous Infusions: . sodium chloride 10 mL/hr at 10/24/15 1020     Time spent: 25 mins  Jeralyn Bennett MD  [facility] Pager [phone]. If 7PM-7AM, please contact night-coverage at [email or web address], password [facility] 10/28/2015, 4:51 PM  LOS: 4 days

## 2015-10-28 NOTE — Progress Notes (Signed)
I discussed Samantha Kline's treatment with pharmacy by phone today. There is increased risk from an adverse drug reaction, particularly QTc prolongation in combining levaquin and azithromycin. She has a low likelihood of a true pneumococcal pneumonia versus her high risk of opportunistic infection based on the clinical picture, and she has already received 5 days of treatment. We will plan on discontinuing levaquin after this morning's dose and starting prophylaxis with azithromycin tomorrow morning. We will continue with starting prophylaxis on fluconazole 100mg  weekly as was previously stated.

## 2015-10-29 DIAGNOSIS — J189 Pneumonia, unspecified organism: Secondary | ICD-10-CM | POA: Insufficient documentation

## 2015-10-29 DIAGNOSIS — B59 Pneumocystosis: Secondary | ICD-10-CM

## 2015-10-29 LAB — CULTURE, BLOOD (ROUTINE X 2)
CULTURE: NO GROWTH
Culture: NO GROWTH

## 2015-10-29 NOTE — Progress Notes (Signed)
Pharmacy Antibiotic Note  Samantha Kline is a 55 y.o. female admitted on 10/24/2015 with possible pneumocystis pneumonia.  Newly diagnosed with HIV. CD4 20, no viral load yet, strep pneumo urinary Ag is positive. Azithromycin and fluconazole added for OI prophylaxis. Renal fx stable.  Levaquin 7/3 > 7/7 Bactrim 7/5 > Fluconazole 7/7 >  Plan: Continue Bactrim 300 mg IV Q 8 hours F/u PCP smear and other cultures   Height: 5\' 4"  (162.6 cm) Weight: 127 lb 1.6 oz (57.652 kg) IBW/kg (Calculated) : 54.7  Temp (24hrs), Avg:97.8 F (36.6 C), Min:97.5 F (36.4 C), Max:98.1 F (36.7 C)   Recent Labs Lab 10/24/15 0310 10/24/15 0319 10/24/15 0746 10/24/15 1044 10/25/15 0538 10/26/15 0717 10/27/15 0718 10/28/15 0451  WBC 5.6  --   --   --  4.4 3.7* 2.5* 4.2  CREATININE 0.86  --   --   --  0.71 1.07* 0.69 0.82  LATICACIDVEN  --  1.05 0.6 1.1  --   --   --   --     Estimated Creatinine Clearance: 66.2 mL/min (by C-G formula based on Cr of 0.82).    No Known Allergies  Thank you for allowing pharmacy to be a part of this patient's care.    Agapito GamesAlison Cristofer Yaffe, PharmD, BCPS Clinical Pharmacist 10/29/2015 11:21 AM

## 2015-10-29 NOTE — Progress Notes (Signed)
Patient ID: Samantha Kline, female   DOB: 14-Aug-1960, 55 y.o.   MRN: [id number]         [facility]    Date of Admission:  10/24/2015           Day 3 trimethoprim sulfamethoxazole and steroids  Principal Problem:   Human immunodeficiency virus (HIV) disease (HCC) Active Problems:   CAP (community acquired pneumonia)   Anemia   Weight loss   Acute hypoxemic respiratory failure (HCC)   Malnutrition of moderate degree   . azithromycin  1,200 mg Oral Weekly  . feeding supplement (ENSURE ENLIVE)  237 mL Oral BID BM  . ferrous sulfate  325 mg Oral Q breakfast  . [START ON 11/04/2015] fluconazole  100 mg Oral Weekly  . ipratropium-albuterol  3 mL Nebulization BID  . predniSONE  40 mg Oral BID  . sulfamethoxazole-trimethoprim  300 mg Intravenous Q8H  . vitamin B-12  100 mcg Oral Daily    SUBJECTIVE: She is feeling a little bit better today. Her cough is improving. She felt better when up walking in her room this morning. She was able to stand at the sink and washout without supplemental oxygen on and did not feel dizzy or short of breath.  Review of Systems: Review of Systems  Constitutional: Positive for weight loss and malaise/fatigue. Negative for fever, chills and diaphoresis.  Respiratory: Positive for cough and shortness of breath. Negative for sputum production.   Cardiovascular: Negative for chest pain.  Gastrointestinal: Negative for nausea, vomiting, abdominal pain and diarrhea.  Neurological: Positive for weakness.    Past Medical History  Diagnosis Date  . Anemia   . Vitamin D deficiency   . Pneumonia 10/24/2015    Social History  Substance Use Topics  . Smoking status: Never Smoker   . Smokeless tobacco: Never Used  . Alcohol Use: Yes     Comment: 10/24/2015 "nothing since the 1980s"    History reviewed. No pertinent family history. No Known Allergies  OBJECTIVE: Filed Vitals:   10/28/15 2139 10/29/15 0508 10/29/15 0931 10/29/15  1434  BP: 124/73 136/79  125/72  Pulse: 90 77  88  Temp: 98.1 F (36.7 C) 97.5 F (36.4 C)  97.5 F (36.4 C)  TempSrc: Oral Oral  Oral  Resp: 18 17  18   Height:      Weight:      SpO2: 100% 100% 98% 100%   Body mass index is 21.81 kg/(m^2).  Physical Exam  Constitutional:  She is resting quietly in bed.  Cardiovascular: Normal rate and regular rhythm.   No murmur heard. Pulmonary/Chest: Effort normal and breath sounds normal.  Neurological: She is alert.  Skin: No rash noted.    Lab Results Lab Results  Component Value Date   WBC 4.2 10/28/2015   HGB 9.2* 10/28/2015   HCT 27.0* 10/28/2015   MCV 87.7 10/28/2015   PLT 351 10/28/2015    Lab Results  Component Value Date   CREATININE 0.82 10/28/2015   BUN 11 10/28/2015   NA 130* 10/28/2015   K 4.5 10/28/2015   CL 96* 10/28/2015   CO2 27 10/28/2015    Lab Results  Component Value Date   ALT 14 10/25/2015   AST 34 10/25/2015   ALKPHOS 42 10/25/2015   BILITOT 0.4 10/25/2015     Microbiology: Recent Results (from the past 240 hour(s))  Blood culture (routine x 2)     Status: None   Collection Time: 10/24/15  3:10  AM  Result Value Ref Range Status   Specimen Description BLOOD LEFT ARM  Final   Special Requests BOTTLES DRAWN AEROBIC AND ANAEROBIC 5 ML  Final   Culture   Final    NO GROWTH 5 DAYS Performed at [facility]    Report Status 10/29/2015 FINAL  Final  Blood culture (routine x 2)     Status: None   Collection Time: 10/24/15  3:19 AM  Result Value Ref Range Status   Specimen Description BLOOD RIGHT ARM  Final   Special Requests BOTTLES DRAWN AEROBIC AND ANAEROBIC 5 ML  Final   Culture   Final    NO GROWTH 5 DAYS Performed at [facility]    Report Status 10/29/2015 FINAL  Final  Culture, sputum-assessment     Status: None   Collection Time: 10/24/15 12:10 PM  Result Value Ref Range Status   Specimen Description SPUTUM  Final   Special Requests NONE  Final   Sputum evaluation    Final    THIS SPECIMEN IS ACCEPTABLE. RESPIRATORY CULTURE REPORT TO FOLLOW.   Report Status 10/24/2015 FINAL  Final  Culture, respiratory (NON-Expectorated)     Status: None   Collection Time: 10/24/15 12:10 PM  Result Value Ref Range Status   Specimen Description SPUTUM  Final   Special Requests NONE  Final   Gram Stain   Final    ABUNDANT WBC PRESENT, PREDOMINANTLY PMN RARE SQUAMOUS EPITHELIAL CELLS PRESENT FEW GRAM POSITIVE COCCI IN PAIRS AND CHAINS RARE GRAM NEGATIVE RODS RARE GRAM POSITIVE RODS    Culture Consistent with normal respiratory flora.  Final   Report Status 10/26/2015 FINAL  Final     ASSESSMENT: She is showing some improvement on empiric therapy for probable pneumocystis pneumonia.  PLAN: 1. Continue trimethoprim sulfamethoxazole and steroids 2. Await results of sputum pneumocystis antigen and HIV viral load 3. I encouraged her to be up out of bed as much as possible  Cliffton Asters, MD [facility] [phone] pager   [phone] cell 10/29/2015, 3:05 PM

## 2015-10-29 NOTE — Progress Notes (Signed)
PROGRESS NOTE  Samantha Kline ZOX:[id number] DOB: Jan 21, 1961 DOA: 10/24/2015 PCP: No PCP Per Patient   Assessment/Plan: Principal Problem:   Human immunodeficiency virus (HIV) disease (HCC) Active Problems:   Anemia   Weight loss   Acute hypoxemic respiratory failure (HCC)   CAP (community acquired pneumonia)   Malnutrition of moderate degree   Acute respiratory failure with hypoxia likely secondary to PCP -Chest x-ray shows hazy bilateral air space opacification, which may reflect persistent or recurrent groundglass opacification is noted on prior CTA 07/2015, suspicious for pulmonary edema, pneumonitis or possible pulmonary alveolar hemorrhage.  -Lab work showing positive HIV -High suspicion for PCP, infectious disease consulted, I spoke with Dr Orvan Falconerampbell  -Started on Bactrim IV every 8 hours along with prednisone ordered milligrams by mouth twice a day. -Plan for a total of 3 weeks of Bactrim therapy along with prednisone taper. -Continues to show improvement, remains on supplemental oxygen   HIV/AIDS -Samantha Kline coming back positive for HIV, high suspicion for PCP for which she was started on Bactrim and prednisone -I spoke with Dr. Orvan Falconerampbell of infectious disease on 10/27/2015, she will get established at the infectious disease clinic. We also discussed having future discussions with her husband and having him tested as well -Lab work showing CD4 count of 20 -She was started on a azithromycin 1200 mg by mouth every weekly, Diflucan 100 mg by mouth every weekly and is receiving treatment for probable PCP.  -She will follow-up at the infectious disease clinic for initiation of antiretroviral therapy  Iron deficiency anemia/ Generalized weakness, chronic/progressive weight loss -Likely secondary to HIV/AIDS as well as PCP -On 10/28/2015 she was assisted out of bed to chair. Physical therapy consulted  Hypokalemia, may be nutritional. EKG sinus tachycardia and LVH, QTc406 Current K  3.1 on admission -On 10/26/2015 a.m. lab work showing potassium of 3.9 after getting replacement   DVT prophylaxis: SCD's  Code Status: Full  Family Communication: Spoke with her sister and mother at bedside, I did not discuss HIV status with them Disposition Plan: Anticipate discharge home when medically stable possibly in the next 3 days Consults called: Pulmonary critical care medicine/infectious disease   Procedures:  none  Antibiotics:  levaquin  Bactrim   Objective: BP 125/72 mmHg  Pulse 88  Temp(Src) 97.5 F (36.4 C) (Oral)  Resp 18  Ht 5\' 4"  (1.626 m)  Wt 57.652 kg (127 lb 1.6 oz)  BMI 21.81 kg/m2  SpO2 100%  Intake/Output Summary (Last 24 hours) at 10/29/15 1633 Last data filed at 10/29/15 1234  Gross per 24 hour  Intake    200 ml  Output   2275 ml  Net  -2075 ml   Filed Weights   10/24/15 0306 10/24/15 0654  Weight: 68.04 kg (150 lb) 57.652 kg (127 lb 1.6 oz)    Exam:   General:  Frail and underweight, chronically ill-appearing, cachectic  Cardiovascular: RRR  Respiratory: improved lung exam, decreased crackles/crepitations  Abdomen: Soft/ND/NT, positive BS  Musculoskeletal: No Edema  Neuro: aaox3  Data Reviewed: Basic Metabolic Panel:  Recent Labs Lab 10/24/15 0310 10/25/15 0538 10/26/15 0717 10/27/15 0718 10/28/15 0451  NA 134* 134* 135 130* 130*  K 3.1* 3.5 3.9 4.0 4.5  CL 99* 99* 98* 96* 96*  CO2 26 28 29 28 27   GLUCOSE 124* 95 103* 244* 184*  BUN 12 8 10 10 11   CREATININE 0.86 0.71 1.07* 0.69 0.82  CALCIUM 8.3* 8.3* 8.7* 8.5* 8.6*  MG  --   --  1.9  --   --    Liver Function Tests:  Recent Labs Lab 10/24/15 0310 10/25/15 0538  AST 36 34  ALT 13* 14  ALKPHOS 53 42  BILITOT 0.8 0.4  PROT 7.6 6.8  ALBUMIN 2.4* 2.0*   No results for input(s): LIPASE, AMYLASE in the last 168 hours.  Recent Labs Lab 10/24/15 0319  AMMONIA 26   CBC:  Recent Labs Lab 10/24/15 0310 10/25/15 0538 10/26/15 0717  10/27/15 0718 10/28/15 0451  WBC 5.6 4.4 3.7* 2.5* 4.2  NEUTROABS 4.5 3.6  --   --   --   HGB 9.9* 8.9* 9.3* 8.7* 9.2*  HCT 29.4* 27.4* 28.6* 27.0* 27.0*  MCV 89.1 89.0 89.9 89.1 87.7  PLT 324 319 330 312 351   Cardiac Enzymes:    Recent Labs Lab 10/24/15 0310 10/24/15 0844 10/24/15 1442 10/24/15 2003  TROPONINI 0.03* <0.03 <0.03 <0.03   BNP (last 3 results)  Recent Labs  08/09/15 2320 10/24/15 0310  BNP 12.2 45.8    ProBNP (last 3 results) No results for input(s): PROBNP in the last 8760 hours.  CBG: No results for input(s): GLUCAP in the last 168 hours.  Recent Results (from the past 240 hour(s))  Blood culture (routine x 2)     Status: None   Collection Time: 10/24/15  3:10 AM  Result Value Ref Range Status   Specimen Description BLOOD LEFT ARM  Final   Special Requests BOTTLES DRAWN AEROBIC AND ANAEROBIC 5 ML  Final   Culture   Final    NO GROWTH 5 DAYS Performed at [facility]    Report Status 10/29/2015 FINAL  Final  Blood culture (routine x 2)     Status: None   Collection Time: 10/24/15  3:19 AM  Result Value Ref Range Status   Specimen Description BLOOD RIGHT ARM  Final   Special Requests BOTTLES DRAWN AEROBIC AND ANAEROBIC 5 ML  Final   Culture   Final    NO GROWTH 5 DAYS Performed at [facility]    Report Status 10/29/2015 FINAL  Final  Culture, sputum-assessment     Status: None   Collection Time: 10/24/15 12:10 PM  Result Value Ref Range Status   Specimen Description SPUTUM  Final   Special Requests NONE  Final   Sputum evaluation   Final    THIS SPECIMEN IS ACCEPTABLE. RESPIRATORY CULTURE REPORT TO FOLLOW.   Report Status 10/24/2015 FINAL  Final  Culture, respiratory (NON-Expectorated)     Status: None   Collection Time: 10/24/15 12:10 PM  Result Value Ref Range Status   Specimen Description SPUTUM  Final   Special Requests NONE  Final   Gram Stain   Final    ABUNDANT WBC PRESENT, PREDOMINANTLY PMN RARE SQUAMOUS  EPITHELIAL CELLS PRESENT FEW GRAM POSITIVE COCCI IN PAIRS AND CHAINS RARE GRAM NEGATIVE RODS RARE GRAM POSITIVE RODS    Culture Consistent with normal respiratory flora.  Final   Report Status 10/26/2015 FINAL  Final     Studies: No results found.  Scheduled Meds: . azithromycin  1,200 mg Oral Weekly  . feeding supplement (ENSURE ENLIVE)  237 mL Oral BID BM  . ferrous sulfate  325 mg Oral Q breakfast  . [START ON 11/04/2015] fluconazole  100 mg Oral Weekly  . ipratropium-albuterol  3 mL Nebulization BID  . predniSONE  40 mg Oral BID  . sulfamethoxazole-trimethoprim  300 mg Intravenous Q8H  . vitamin B-12  100 mcg Oral  Daily    Continuous Infusions: . sodium chloride 10 mL/hr at 10/24/15 1020     Time spent: 15 mins  Jeralyn Bennett MD  [facility] Pager [phone]. If 7PM-7AM, please contact night-coverage at [email or web address], password [facility] 10/29/2015, 4:33 PM  LOS: 5 days

## 2015-10-30 LAB — BASIC METABOLIC PANEL
ANION GAP: 8 (ref 5–15)
BUN: 15 mg/dL (ref 6–20)
CALCIUM: 9.1 mg/dL (ref 8.9–10.3)
CO2: 25 mmol/L (ref 22–32)
CREATININE: 0.93 mg/dL (ref 0.44–1.00)
Chloride: 97 mmol/L — ABNORMAL LOW (ref 101–111)
GFR calc non Af Amer: >60 mL/min (ref >60)
Glucose, Bld: 140 mg/dL — ABNORMAL HIGH (ref 65–99)
Potassium: 4.9 mmol/L (ref 3.5–5.1)
SODIUM: 130 mmol/L — AB (ref 135–145)

## 2015-10-30 LAB — CBC
HCT: 29.2 % — ABNORMAL LOW (ref 36.0–46.0)
HEMOGLOBIN: 9.2 g/dL — AB (ref 12.0–15.0)
MCH: 28.5 pg (ref 26.0–34.0)
MCHC: 31.5 g/dL (ref 30.0–36.0)
MCV: 90.4 fL (ref 78.0–100.0)
Platelets: 383 10*3/uL (ref 150–400)
RBC: 3.23 MIL/uL — AB (ref 3.87–5.11)
RDW: 14.7 % (ref 11.5–15.5)
WBC: 4.4 10*3/uL (ref 4.0–10.5)

## 2015-10-30 NOTE — Progress Notes (Signed)
PROGRESS NOTE  Samantha Kline JWJ:[id number] DOB: [birth year] DOA: 10/24/2015 PCP: No PCP Per Patient   Assessment/Plan: Principal Problem:   Human immunodeficiency virus (HIV) disease (HCC) Active Problems:   Anemia   Weight loss   Acute hypoxemic respiratory failure (HCC)   CAP (community acquired pneumonia)   Malnutrition of moderate degree   Bilateral pneumonia   Acute respiratory failure with hypoxia likely secondary to PCP -Chest x-ray shows hazy bilateral air space opacification, which may reflect persistent or recurrent groundglass opacification is noted on prior CTA 07/2015, suspicious for pulmonary edema, pneumonitis or possible pulmonary alveolar hemorrhage.  -Lab work showing positive HIV -High suspicion for PCP, infectious disease consulted, I spoke with Dr Orvan Falconer  -Started on Bactrim IV every 8 hours along with prednisone ordered milligrams by mouth twice a day. -Plan for a total of 3 weeks of Bactrim therapy along with prednisone taper. -On 10/30/2015 she was ambulated down the hallway, became tachycardic. Her sats maintained in the mid 90s. I think we can work on titrating her oxygen down  HIV/AIDS -Mrs Backs coming back positive for HIV, high suspicion for PCP for which she was started on Bactrim and prednisone -I spoke with Dr. Orvan Falconer of infectious disease on 10/27/2015, she will get established at the infectious disease clinic. We also discussed having future discussions with her husband and having him tested as well -Lab work showing CD4 count of 20 -She was started on a azithromycin 1200 mg by mouth every weekly, Diflucan 100 mg by mouth every weekly and is receiving treatment for probable PCP.  -She will follow-up at the infectious disease clinic for initiation of antiretroviral therapy  Iron deficiency anemia/ Generalized weakness, chronic/progressive weight loss -Likely secondary to HIV/AIDS as well as PCP -On 10/28/2015 she was assisted out of bed to  chair. Physical therapy consulted  Hypokalemia, may be nutritional. EKG sinus tachycardia and LVH, QTc406 Current K 3.1 on admission -On 10/26/2015 a.m. lab work showing potassium of 3.9 after getting replacement   DVT prophylaxis: SCD's  Code Status: Full  Family Communication: Spoke with her sister and mother at bedside, I did not discuss HIV status with them Disposition Plan: Anticipate discharge home when medically stable possibly in the next 24-48 hours Consults called: Pulmonary critical care medicine/infectious disease   Procedures:  none  Antibiotics:  levaquin  Bactrim   Objective: BP 132/81 mmHg  Pulse 91  Temp(Src) 97.9 F (36.6 C) (Oral)  Resp 20  Ht 5\' 4"  (1.626 m)  Wt 57.652 kg (127 lb 1.6 oz)  BMI 21.81 kg/m2  SpO2 100%  Intake/Output Summary (Last 24 hours) at 10/30/15 1538 Last data filed at 10/30/15 1300  Gross per 24 hour  Intake 708.75 ml  Output   1900 ml  Net -1191.25 ml   Filed Weights   10/24/15 0306 10/24/15 0654  Weight: 68.04 kg (150 lb) 57.652 kg (127 lb 1.6 oz)    Exam:   General:  Frail and underweight, chronically ill-appearing, cachectic  Cardiovascular: RRR  Respiratory: improved lung exam, decreased crackles/crepitations  Abdomen: Soft/ND/NT, positive BS  Musculoskeletal: No Edema  Neuro: aaox3  Data Reviewed: Basic Metabolic Panel:  Recent Labs Lab 10/25/15 0538 10/26/15 0717 10/27/15 0718 10/28/15 0451 10/30/15 0550  NA 134* 135 130* 130* 130*  K 3.5 3.9 4.0 4.5 4.9  CL 99* 98* 96* 96* 97*  CO2 28 29 28 27 25   GLUCOSE 95 103* 244* 184* 140*  BUN 8 10 10 11 15   CREATININE 0.71  1.07* 0.69 0.82 0.93  CALCIUM 8.3* 8.7* 8.5* 8.6* 9.1  MG  --  1.9  --   --   --    Liver Function Tests:  Recent Labs Lab 10/24/15 0310 10/25/15 0538  AST 36 34  ALT 13* 14  ALKPHOS 53 42  BILITOT 0.8 0.4  PROT 7.6 6.8  ALBUMIN 2.4* 2.0*   No results for input(s): LIPASE, AMYLASE in the last 168  hours.  Recent Labs Lab 10/24/15 0319  AMMONIA 26   CBC:  Recent Labs Lab 10/24/15 0310 10/25/15 0538 10/26/15 0717 10/27/15 0718 10/28/15 0451 10/30/15 0550  WBC 5.6 4.4 3.7* 2.5* 4.2 4.4  NEUTROABS 4.5 3.6  --   --   --   --   HGB 9.9* 8.9* 9.3* 8.7* 9.2* 9.2*  HCT 29.4* 27.4* 28.6* 27.0* 27.0* 29.2*  MCV 89.1 89.0 89.9 89.1 87.7 90.4  PLT 324 319 330 312 351 383   Cardiac Enzymes:    Recent Labs Lab 10/24/15 0310 10/24/15 0844 10/24/15 1442 10/24/15 2003  TROPONINI 0.03* <0.03 <0.03 <0.03   BNP (last 3 results)  Recent Labs  08/09/15 2320 10/24/15 0310  BNP 12.2 45.8    ProBNP (last 3 results) No results for input(s): PROBNP in the last 8760 hours.  CBG: No results for input(s): GLUCAP in the last 168 hours.  Recent Results (from the past 240 hour(s))  Blood culture (routine x 2)     Status: None   Collection Time: 10/24/15  3:10 AM  Result Value Ref Range Status   Specimen Description BLOOD LEFT ARM  Final   Special Requests BOTTLES DRAWN AEROBIC AND ANAEROBIC 5 ML  Final   Culture   Final    NO GROWTH 5 DAYS Performed at [facility]    Report Status 10/29/2015 FINAL  Final  Blood culture (routine x 2)     Status: None   Collection Time: 10/24/15  3:19 AM  Result Value Ref Range Status   Specimen Description BLOOD RIGHT ARM  Final   Special Requests BOTTLES DRAWN AEROBIC AND ANAEROBIC 5 ML  Final   Culture   Final    NO GROWTH 5 DAYS Performed [facility]    Report Status 10/29/2015 FINAL  Final  Culture, sputum-assessment     Status: None   Collection Time: 10/24/15 12:10 PM  Result Value Ref Range Status   Specimen Description SPUTUM  Final   Special Requests NONE  Final   Sputum evaluation   Final    THIS SPECIMEN IS ACCEPTABLE. RESPIRATORY CULTURE REPORT TO FOLLOW.   Report Status 10/24/2015 FINAL  Final  Culture, respiratory (NON-Expectorated)     Status: None   Collection Time: 10/24/15 12:10 PM  Result Value  Ref Range Status   Specimen Description SPUTUM  Final   Special Requests NONE  Final   Gram Stain   Final    ABUNDANT WBC PRESENT, PREDOMINANTLY PMN RARE SQUAMOUS EPITHELIAL CELLS PRESENT FEW GRAM POSITIVE COCCI IN PAIRS AND CHAINS RARE GRAM NEGATIVE RODS RARE GRAM POSITIVE RODS    Culture Consistent with normal respiratory flora.  Final   Report Status 10/26/2015 FINAL  Final     Studies: No results found.  Scheduled Meds: . azithromycin  1,200 mg Oral Weekly  . feeding supplement (ENSURE ENLIVE)  237 mL Oral BID BM  . ferrous sulfate  325 mg Oral Q breakfast  . [START ON 11/04/2015] fluconazole  100 mg Oral Weekly  . ipratropium-albuterol  3 mL Nebulization BID  . predniSONE  40 mg Oral BID  . sulfamethoxazole-trimethoprim  300 mg Intravenous Q8H  . vitamin B-12  100 mcg Oral Daily    Continuous Infusions: . sodium chloride 10 mL/hr at 10/24/15 1020     Time spent: 15 mins  Jeralyn Bennett MD  [facility] Pager [phone]. If 7PM-7AM, please contact night-coverage at [email or web address], password [email or web address] 10/30/2015, 3:38 PM  LOS: 6 days

## 2015-10-31 LAB — PNEUMOCYSTIS JIROVECI SMEAR BY DFA: Pneumocystis jiroveci Ag: NEGATIVE

## 2015-10-31 LAB — GC/CHLAMYDIA PROBE AMP (~~LOC~~) NOT AT ARMC
Chlamydia: NEGATIVE
Neisseria Gonorrhea: NEGATIVE

## 2015-10-31 MED ORDER — PREDNISONE 20 MG PO TABS: ORAL_TABLET | ORAL | Status: DC

## 2015-10-31 MED ORDER — EMTRICITABINE-TENOFOVIR AF 200-25 MG PO TABS: 1.0000 | ORAL_TABLET | Freq: Every day | ORAL | Status: DC

## 2015-10-31 MED ORDER — DOLUTEGRAVIR SODIUM 50 MG PO TABS: 50.0000 mg | ORAL_TABLET | Freq: Every day | ORAL | Status: DC

## 2015-10-31 MED ORDER — FLUCONAZOLE 100 MG PO TABS: 100.0000 mg | ORAL_TABLET | ORAL | Status: DC

## 2015-10-31 MED ORDER — SULFAMETHOXAZOLE-TRIMETHOPRIM 800-160 MG PO TABS: 1.0000 | ORAL_TABLET | Freq: Two times a day (BID) | ORAL | Status: DC

## 2015-10-31 MED ORDER — AZITHROMYCIN 600 MG PO TABS: 1200.0000 mg | ORAL_TABLET | ORAL | Status: DC

## 2015-10-31 MED ORDER — SULFAMETHOXAZOLE-TRIMETHOPRIM 800-160 MG PO TABS: ORAL_TABLET | ORAL | Status: DC

## 2015-10-31 NOTE — Discharge Summary (Addendum)
Physician Discharge Summary  Samantha FewHythia Kline ZOX:[id number] DOB: 1960/11/21 DOA: 10/24/2015  PCP: No PCP Per Patient  Admit date: 10/24/2015 Discharge date: 10/31/2015  Time spent: 35 minutes  Recommendations for Outpatient Follow-up:  1. She was discharged on Bactrim DS 2 tabs PO TID for 16 days to complete a total of 3 weeks and Prednisone taper for a duration of 3 weeks as well, as recommended by Dr Orvan Falconerampbell of ID 2. Prior to discharge she was set up with Home Health Services For home PT/OT and RN.     Discharge Diagnoses:  Principal Problem:   Human immunodeficiency virus (HIV) disease (HCC) Active Problems:   Anemia   Weight loss   Acute hypoxemic respiratory failure (HCC)   CAP (community acquired pneumonia)   Malnutrition of moderate degree   Bilateral pneumonia   Discharge Condition: Stable/improved  Diet recommendation: Regular  Filed Weights   10/24/15 0306 10/24/15 0654 10/31/15 0510  Weight: 68.04 kg (150 lb) 57.652 kg (127 lb 1.6 oz) 58.6 kg (129 lb 3 oz)    History of present illness:   Shamel Manson PasseyBrown is a 55 y.o. female with medical history significant for anemia, Vit D deficiency presenting to [facility] with progressive weakness over the last 6 months, worse over the last 2 days. She was seen at [facility] on 7/1 but no apparent etiology for her symptoms were found and was sent home. Patient had initially been seen in April 2017 with the same complaints. CT A was performed, negative for PE,but bilateral groundglass opacities, pulmonary edema versus pneumonitis versus atypical infection versus inflammatory process was noted. She was treated prophylactically for, and instructed to follow-up with PCP, which she failed to do so. Since that time, the patient has lost 50 pounds, with significantly decreased appetite, she denies any nausea or vomiting. No diarrhea. She feels very tired,with increased shortness of breath. She denies any cough, or hemoptysis. She  denies any chest pains or palpitations. She denies any recent fevers until these admission, with a tmax of 101,without chills or night sweats. She reportssick contacts from her grandkids.She denies any recent trips. She denies any pica.   Hospital Course:  [name] is a 55 year old female who was admitted to the medicine service on 10/24/2015 when she presented with complaints of fatigue, malaise, 50 pound unintentional weight loss over a six-month period. She presented to the emergency department with complaints of worsening shortness of breath, fevers, chills. Initial workup included a CT scan of lungs that revealed groundglass opacities in bilateral lungs. She was started on empiric antimicrobial therapy with Levaquin. Pulmonary critical care medicine was consulted. She was positive for HIV, resulted on 10/26/2015. Infectious disease was consulted. Dr. Orvan Falconerampbell recommended initiating empiric treatment for PCP given classic presentation. She was started on IV Bactrim along with prednisone. She showed subsequent clinical improvement. Infectious disease recommended a total of 3 weeks of Bactrim and prednisone. Labs showed a CD4 count of 20. She was started on prophylactic azithromycin and Diflucan. Given significant clinical improvement she was discharged home on 10/31/2015. She will follow up with Dr. Orvan Falconerampbell in the infectious disease clinic to discuss initiation of antiretroviral therapy. Prior to discharge she was set up with home health services for physical therapy, occupational therapy and RN. Furthermore she was set up with some home oxygen.  Consultations:  Pulmonary critical care medicine  Infectious disease  Discharge Exam: Filed Vitals:   10/30/15 2121 10/31/15 0510  BP: 129/86 134/89  Pulse: 105 88  Temp: 98 F (36.7 C) 98.2 F (36.8 C)  Resp: 18 16    General: Frail and underweight, chronically ill-appearing, cachectic  Cardiovascular: RRR  Respiratory: improved lung exam,  decreased crackles/crepitations  Abdomen: Soft/ND/NT, positive BS  Musculoskeletal: No Edema  Neuro: aaox3  Discharge Instructions   Discharge Instructions    Call MD for:  difficulty breathing, headache or visual disturbances    Complete by:  As directed      Call MD for:  extreme fatigue    Complete by:  As directed      Call MD for:  hives    Complete by:  As directed      Call MD for:  persistant dizziness or light-headedness    Complete by:  As directed      Call MD for:  persistant nausea and vomiting    Complete by:  As directed      Call MD for:  redness, tenderness, or signs of infection (pain, swelling, redness, odor or green/yellow discharge around incision site)    Complete by:  As directed      Call MD for:  severe uncontrolled pain    Complete by:  As directed      Call MD for:  temperature >100.4    Complete by:  As directed      Call MD for:    Complete by:  As directed      Diet - low sodium heart healthy    Complete by:  As directed      Increase activity slowly    Complete by:  As directed           Current Discharge Medication List    START taking these medications   Details  dolutegravir (TIVICAY) 50 MG tablet Take 1 tablet (50 mg total) by mouth daily. Qty: 30 tablet, Refills: 3    emtricitabine-tenofovir AF (DESCOVY) 200-25 MG tablet Take 1 tablet by mouth daily. Qty: 30 tablet, Refills: 3    fluconazole (DIFLUCAN) 100 MG tablet Take 1 tablet (100 mg total) by mouth once a week. Qty: 4 tablet, Refills: 6    predniSONE (DELTASONE) 20 MG tablet Take 40 mg PO q daily for 5 days, followed by 30 mg PO q daily for 11 days. Qty Sufficient Qty: 40 tablet, Refills: 0    sulfamethoxazole-trimethoprim (BACTRIM DS,SEPTRA DS) 800-160 MG tablet 2 tabs PO TID x 16 days, then 1 tab PO q daily thereafter Qty Sufficient Qty: 36 tablet, Refills: 0      CONTINUE these medications which have CHANGED   Details  azithromycin (ZITHROMAX) 600 MG tablet Take 2  tablets (1,200 mg total) by mouth once a week. Qty: 4 tablet, Refills: 6      CONTINUE these medications which have NOT CHANGED   Details  acetaminophen (TYLENOL) 325 MG tablet Take 650 mg by mouth every 6 (six) hours as needed for mild pain or headache.    ferrous sulfate 325 (65 FE) MG tablet Take 325 mg by mouth daily with breakfast.    vitamin B-12 (CYANOCOBALAMIN) 100 MCG tablet Take 100 mcg by mouth daily.    Vitamin D, Ergocalciferol, (DRISDOL) 50000 units CAPS capsule Take 1 capsule by mouth every 7 (seven) days.       No Known Allergies Follow-up Information    Follow up with Cliffton Asters, MD In 1 week.   Specialty:  Infectious Diseases   Contact information:   [address] [place] [address]  [phone]       Follow up with [facility].   Why:  [facility] will follow up with patient on name of Home Agency once request has processed. Scheduled start of care date 11/02/2015. Case ID # [id number]   Contact information:   [phone]       The results of significant diagnostics from this hospitalization (including imaging, microbiology, ancillary and laboratory) are listed below for reference.    Significant Diagnostic Studies: Dg Chest 2 View  10/24/2015  CLINICAL DATA:  Subacute onset of generalized weakness. Incontinence. Initial encounter. EXAM: CHEST  2 VIEW COMPARISON:  CTA of the chest performed 08/10/2015, and chest radiograph performed 08/09/2015 FINDINGS: The lungs are well-aerated. Hazy bilateral airspace opacification may reflect persistent or recurrent ground-glass opacification as noted on prior CTA, which could be seen with pulmonary edema, pneumonitis or possibly pulmonary alveolar hemorrhage. There is no evidence of pleural effusion or pneumothorax. The heart is normal in size; the mediastinal contour is within normal limits. No acute osseous abnormalities are seen. IMPRESSION: Hazy bilateral airspace opacification may reflect  persistent or recurrent ground-glass opacification as noted on prior CTA, which could be seen with pulmonary edema, pneumonitis or possibly pulmonary alveolar hemorrhage. Electronically Signed   By: Roanna Raider M.D.   On: 10/24/2015 04:07   Ct Head Wo Contrast  10/24/2015  CLINICAL DATA:  Subacute onset of generalized weakness. Incontinence. Initial encounter. EXAM: CT HEAD WITHOUT CONTRAST TECHNIQUE: Contiguous axial images were obtained from the base of the skull through the vertex without intravenous contrast. COMPARISON:  None. FINDINGS: There is no evidence of acute infarction, mass lesion, or intra- or extra-axial hemorrhage on CT. Prominence of the ventricles sulci reflects mild cortical volume loss. Mild periventricular white matter change likely reflects small vessel ischemic microangiopathy. Mild cerebellar atrophy is noted. The brainstem and fourth ventricle are within normal limits. The basal ganglia are unremarkable in appearance. The cerebral hemispheres demonstrate grossly normal gray-white differentiation. No mass effect or midline shift is seen. There is no evidence of fracture; visualized osseous structures are unremarkable in appearance. The visualized portions of the orbits are within normal limits. The paranasal sinuses and mastoid air cells are well-aerated. No significant soft tissue abnormalities are seen. IMPRESSION: 1. No acute intracranial pathology seen on CT. 2. Mild cortical volume loss and scattered small vessel ischemic microangiopathy. Electronically Signed   By: Roanna Raider M.D.   On: 10/24/2015 04:08   Ct Chest W Contrast  10/24/2015  CLINICAL DATA:  Weight loss and chronic weakness for 6 months EXAM: CT CHEST, ABDOMEN, AND PELVIS WITH CONTRAST TECHNIQUE: Multidetector CT imaging of the chest, abdomen and pelvis was performed following the standard protocol during bolus administration of intravenous contrast. Oral contrast was also administered for the CT abdomen and  pelvis portions. CONTRAST:  ISOVUE-300 IOPAMIDOL (ISOVUE-300) INJECTION 61% COMPARISON:  Chest radiograph September 29, 2015 FINDINGS: CT CHEST FINDINGS Mediastinum/Lymph Nodes: Thyroid appears normal. There is a single lymph node just to the right of the carina anteriorly measuring 1.5 x 1.0 cm. No other lymph node prominence is evident in the chest region. There is a fairly small focal hiatal hernia. There is atherosclerotic calcification in the aorta. There is no demonstrable thoracic aortic aneurysm or dissection. The visualized great vessels appear unremarkable. The left and right common carotid arteries arise as a common trunk, an anatomic variant. There are scattered foci of coronary artery calcification. The pericardium is not appreciably thickened. There is no major vessel pulmonary embolus evident.  Lungs/Pleura: There is extensive ground-glass type opacity throughout the lungs. There are multiple irregular nodular opacities throughout the lungs, more on the right than on the left. Nodular opacities range in size from as small as 3 mm to as large as 1.3 x 1.1 cm. There is patchy confluence in both lower lobes with associated ground-glass type opacity in these areas. There is tree-in-bud type appearance in the right upper lobe. Musculoskeletal: There are no blastic or lytic bone lesions. There is degenerative change in the thoracic spine. CT ABDOMEN PELVIS FINDINGS Hepatobiliary: There is hepatic steatosis. No focal liver lesions are evident. The gallbladder is contracted without appreciable gallbladder wall thickening. There is no biliary duct dilatation appreciable. Pancreas: No pancreatic mass or inflammatory focus. Spleen: No splenic lesions are evident. Adrenals/Urinary Tract: Adrenals appear unremarkable bilaterally. There is a 7 x 7 mm cyst in the anterior upper to mid left kidney. On delayed phase images, there is subtle enhancement in the anterior mid left kidney measuring 1.5 x 1.1 cm, best  appreciated on axial slice 19 series 3 a 1. No other renal lesions are evident. There is no demonstrable renal or ureteral calculus. Urinary bladder is midline with wall thickness within normal limits. Stomach/Bowel: There is fairly diffuse stool throughout the colon. Mild thickening of the gastric antrum may in part be due to incomplete distention. There is no small or large bowel wall or mesenteric thickening. There is lipomatous infiltration of the ileocecal valve. There is no bowel obstruction. No free air or portal venous air. Vascular/Lymphatic: There is no appreciable abdominal aortic aneurysm. There are scattered foci of atherosclerotic calcification in the distal aorta and common iliac arteries. Major mesenteric vessels appear patent. There is no appreciable adenopathy in the abdomen or pelvis. Reproductive: Uterus is anteverted. There is no pelvic mass or pelvic fluid collection. Other: Appendix is not appreciable. There is no periappendiceal region inflammation. There is no ascites or abscess in the abdomen or pelvis. Musculoskeletal: There is degenerative change in the lumbar spine. There is also degenerative change in the right hip with subchondral cysts on each side of the joint. There are no well-defined blastic or lytic bone lesions. There is no intramuscular abdominal wall lesion. IMPRESSION: Chest CT: Multiple irregular pulmonary nodular opacities scattered throughout the right lung as well as more scattered to a lesser extent on the left. There is extensive ground-glass type opacity throughout the lungs as well, primarily but not exclusively in the lower lobes. There is tree-in-bud type appearance throughout much of the right upper lobe. The tree-in-bud appearance in the right upper lobe is quite consistent with underlying pneumonia. Suspect that all of these changes may be due to underlying pneumonia, although a somewhat atypical presentation of neoplasm is a differential consideration. Advise  Pulmonary Medicine consultation with consideration for bronchoscopy given the constellation of findings present. Single mildly prominent lymph node just to the right of the carina anteriorly. No other lymph node enlargement is appreciable. There are scattered foci of coronary artery calcification. There is a fairly small hiatal hernia. CT abdomen and pelvis: Subtle enhancement in the anterior upper to mid left kidney, concerning for a mass. This lesion measures 1.5 x 1.1 cm. Would advise pre and post-contrast renal MR to further assess. Early diffuse stool throughout colon. No bowel obstruction. No abscess. No bowel wall thickening. No adenopathy in the abdomen or pelvis. Hepatic steatosis. Mild wall thickening in the mid stomach is probably due to incomplete distention. A degree of gastritis cannot be excluded,  however. Aortic atherosclerosis. Electronically Signed   By: Bretta Bang III M.D.   On: 10/24/2015 13:52   Ct Abdomen Pelvis W Contrast  10/24/2015  CLINICAL DATA:  Weight loss and chronic weakness for 6 months EXAM: CT CHEST, ABDOMEN, AND PELVIS WITH CONTRAST TECHNIQUE: Multidetector CT imaging of the chest, abdomen and pelvis was performed following the standard protocol during bolus administration of intravenous contrast. Oral contrast was also administered for the CT abdomen and pelvis portions. CONTRAST:  ISOVUE-300 IOPAMIDOL (ISOVUE-300) INJECTION 61% COMPARISON:  Chest radiograph September 29, 2015 FINDINGS: CT CHEST FINDINGS Mediastinum/Lymph Nodes: Thyroid appears normal. There is a single lymph node just to the right of the carina anteriorly measuring 1.5 x 1.0 cm. No other lymph node prominence is evident in the chest region. There is a fairly small focal hiatal hernia. There is atherosclerotic calcification in the aorta. There is no demonstrable thoracic aortic aneurysm or dissection. The visualized great vessels appear unremarkable. The left and right common carotid arteries arise as a  common trunk, an anatomic variant. There are scattered foci of coronary artery calcification. The pericardium is not appreciably thickened. There is no major vessel pulmonary embolus evident. Lungs/Pleura: There is extensive ground-glass type opacity throughout the lungs. There are multiple irregular nodular opacities throughout the lungs, more on the right than on the left. Nodular opacities range in size from as small as 3 mm to as large as 1.3 x 1.1 cm. There is patchy confluence in both lower lobes with associated ground-glass type opacity in these areas. There is tree-in-bud type appearance in the right upper lobe. Musculoskeletal: There are no blastic or lytic bone lesions. There is degenerative change in the thoracic spine. CT ABDOMEN PELVIS FINDINGS Hepatobiliary: There is hepatic steatosis. No focal liver lesions are evident. The gallbladder is contracted without appreciable gallbladder wall thickening. There is no biliary duct dilatation appreciable. Pancreas: No pancreatic mass or inflammatory focus. Spleen: No splenic lesions are evident. Adrenals/Urinary Tract: Adrenals appear unremarkable bilaterally. There is a 7 x 7 mm cyst in the anterior upper to mid left kidney. On delayed phase images, there is subtle enhancement in the anterior mid left kidney measuring 1.5 x 1.1 cm, best appreciated on axial slice 19 series 3 a 1. No other renal lesions are evident. There is no demonstrable renal or ureteral calculus. Urinary bladder is midline with wall thickness within normal limits. Stomach/Bowel: There is fairly diffuse stool throughout the colon. Mild thickening of the gastric antrum may in part be due to incomplete distention. There is no small or large bowel wall or mesenteric thickening. There is lipomatous infiltration of the ileocecal valve. There is no bowel obstruction. No free air or portal venous air. Vascular/Lymphatic: There is no appreciable abdominal aortic aneurysm. There are scattered foci  of atherosclerotic calcification in the distal aorta and common iliac arteries. Major mesenteric vessels appear patent. There is no appreciable adenopathy in the abdomen or pelvis. Reproductive: Uterus is anteverted. There is no pelvic mass or pelvic fluid collection. Other: Appendix is not appreciable. There is no periappendiceal region inflammation. There is no ascites or abscess in the abdomen or pelvis. Musculoskeletal: There is degenerative change in the lumbar spine. There is also degenerative change in the right hip with subchondral cysts on each side of the joint. There are no well-defined blastic or lytic bone lesions. There is no intramuscular abdominal wall lesion. IMPRESSION: Chest CT: Multiple irregular pulmonary nodular opacities scattered throughout the right lung as well as more scattered  to a lesser extent on the left. There is extensive ground-glass type opacity throughout the lungs as well, primarily but not exclusively in the lower lobes. There is tree-in-bud type appearance throughout much of the right upper lobe. The tree-in-bud appearance in the right upper lobe is quite consistent with underlying pneumonia. Suspect that all of these changes may be due to underlying pneumonia, although a somewhat atypical presentation of neoplasm is a differential consideration. Advise Pulmonary Medicine consultation with consideration for bronchoscopy given the constellation of findings present. Single mildly prominent lymph node just to the right of the carina anteriorly. No other lymph node enlargement is appreciable. There are scattered foci of coronary artery calcification. There is a fairly small hiatal hernia. CT abdomen and pelvis: Subtle enhancement in the anterior upper to mid left kidney, concerning for a mass. This lesion measures 1.5 x 1.1 cm. Would advise pre and post-contrast renal MR to further assess. Early diffuse stool throughout colon. No bowel obstruction. No abscess. No bowel wall  thickening. No adenopathy in the abdomen or pelvis. Hepatic steatosis. Mild wall thickening in the mid stomach is probably due to incomplete distention. A degree of gastritis cannot be excluded, however. Aortic atherosclerosis. Electronically Signed   By: Bretta Bang III M.D.   On: 10/24/2015 13:52    Microbiology: Recent Results (from the past 240 hour(s))  Blood culture (routine x 2)     Status: None   Collection Time: 10/24/15  3:10 AM  Result Value Ref Range Status   Specimen Description BLOOD LEFT ARM  Final   Special Requests BOTTLES DRAWN AEROBIC AND ANAEROBIC 5 ML  Final   Culture   Final    NO GROWTH 5 DAYS Performed at [facility]    Report Status 10/29/2015 FINAL  Final  Blood culture (routine x 2)     Status: None   Collection Time: 10/24/15  3:19 AM  Result Value Ref Range Status   Specimen Description BLOOD RIGHT ARM  Final   Special Requests BOTTLES DRAWN AEROBIC AND ANAEROBIC 5 ML  Final   Culture   Final    NO GROWTH 5 DAYS Performed at [facility]    Report Status 10/29/2015 FINAL  Final  Culture, sputum-assessment     Status: None   Collection Time: 10/24/15 12:10 PM  Result Value Ref Range Status   Specimen Description SPUTUM  Final   Special Requests NONE  Final   Sputum evaluation   Final    THIS SPECIMEN IS ACCEPTABLE. RESPIRATORY CULTURE REPORT TO FOLLOW.   Report Status 10/24/2015 FINAL  Final  Culture, respiratory (NON-Expectorated)     Status: None   Collection Time: 10/24/15 12:10 PM  Result Value Ref Range Status   Specimen Description SPUTUM  Final   Special Requests NONE  Final   Gram Stain   Final    ABUNDANT WBC PRESENT, PREDOMINANTLY PMN RARE SQUAMOUS EPITHELIAL CELLS PRESENT Kline GRAM POSITIVE COCCI IN PAIRS AND CHAINS RARE GRAM NEGATIVE RODS RARE GRAM POSITIVE RODS    Culture Consistent with normal respiratory flora.  Final   Report Status 10/26/2015 FINAL  Final     Labs: Basic Metabolic Panel:  Recent  Labs Lab 10/25/15 0538 10/26/15 0717 10/27/15 0718 10/28/15 0451 10/30/15 0550  NA 134* 135 130* 130* 130*  K 3.5 3.9 4.0 4.5 4.9  CL 99* 98* 96* 96* 97*  CO2 28 29 28 27 25   GLUCOSE 95 103* 244* 184* 140*  BUN 8 10 10  11 15  CREATININE 0.71 1.07* 0.69 0.82 0.93  CALCIUM 8.3* 8.7* 8.5* 8.6* 9.1  MG  --  1.9  --   --   --    Liver Function Tests:  Recent Labs Lab 10/25/15 0538  AST 34  ALT 14  ALKPHOS 42  BILITOT 0.4  PROT 6.8  ALBUMIN 2.0*   No results for input(s): LIPASE, AMYLASE in the last 168 hours. No results for input(s): AMMONIA in the last 168 hours. CBC:  Recent Labs Lab 10/25/15 0538 10/26/15 0717 10/27/15 0718 10/28/15 0451 10/30/15 0550  WBC 4.4 3.7* 2.5* 4.2 4.4  NEUTROABS 3.6  --   --   --   --   HGB 8.9* 9.3* 8.7* 9.2* 9.2*  HCT 27.4* 28.6* 27.0* 27.0* 29.2*  MCV 89.0 89.9 89.1 87.7 90.4  PLT 319 330 312 351 383   Cardiac Enzymes:  Recent Labs Lab 10/24/15 1442 10/24/15 2003  TROPONINI <0.03 <0.03   BNP: BNP (last 3 results)  Recent Labs  08/09/15 2320 10/24/15 0310  BNP 12.2 45.8    ProBNP (last 3 results) No results for input(s): PROBNP in the last 8760 hours.  CBG: No results for input(s): GLUCAP in the last 168 hours.     Signed:  Jeralyn Bennett MD.  [facility] 10/31/2015, 1:27 PM

## 2015-10-31 NOTE — Care Management Note (Addendum)
Case Management Note  Patient Details  Name: Samantha Kline MRN: [id number] Date of Birth: Sep 16, 1960  Subjective/Objective:     PNA, anemia               Action/Plan: Discharge Planning: AVS reviewed:  NCM spoke to pt and explained NCM is required to go through [employer] portal, [facility] to arrange her HH. Pt states she lives at home with husband. [facility] will follow up with patient on name of Home Agency once request has processed. Scheduled start of care date 11/02/2015. Case ID # [id number], Contacted [facility] for delivery of 3n1 and RW. [facility] Medical Team will evaluate oxygen needs and follow up with pt if DME will be covered. Pt oxygen sats are 91% on RA while ambulating. Provided pt with list of PCP's in [place].     Expected Discharge Date:  10/31/2015              Expected Discharge Plan:  Home w Home Health Services  In-House Referral:  NA  Discharge planning Services  CM Consult  Post Acute Care Choice:  Home Health Choice offered to:  NA  DME Arranged:  N/A DME Agency:  NA  HH Arranged:  RN, PT, OT, Nurse's Aide HH Agency:  Other - See comment  Status of Service:  Completed, signed off  If discussed at Long Length of Stay Meetings, dates discussed:    Additional Comments:  Elliot CousinShavis, Leshae Mcclay Ellen, RN 10/31/2015, 12:40 PM

## 2015-10-31 NOTE — Progress Notes (Signed)
[facility]    Date of Admission:  10/24/2015           Day 5 trimethoprim-sulfamethoxazole and steroids Attestation:  Samantha Kline is improving on therapy for presumed pneumocystis pneumonia complicating newly diagnosed HIV infection. Her room air O2 sats are over 90% when ambulating. I am in agreement with discharge home on the following regimen:  1. Trimethoprim sulfamethoxazole 2 double strength tablets 3 times daily for 16 more days then change to 1 double strength tablet daily 2. Prednisone 40 mg daily for 5 days then 20 mg daily for 11 days 3. Azithromycin 1200 mg weekly 4. Fluconazole 100 mg weekly 5. Start Descovy 1 tablet daily and Tivicay 50 mg daily 6. I will arrange follow-up in my clinic within the next 2 weeks  Cliffton Asters, MD [facility] [phone] pager   [phone] cell 10/31/2015, 1:01 PM  Principal Problem:   Human immunodeficiency virus (HIV) disease (HCC) Active Problems:   CAP (community acquired pneumonia)   Anemia   Weight loss   Acute hypoxemic respiratory failure (HCC)   Malnutrition of moderate degree   Bilateral pneumonia   . azithromycin  1,200 mg Oral Weekly  . feeding supplement (ENSURE ENLIVE)  237 mL Oral BID BM  . ferrous sulfate  325 mg Oral Q breakfast  . [START ON 11/04/2015] fluconazole  100 mg Oral Weekly  . ipratropium-albuterol  3 mL Nebulization BID  . predniSONE  40 mg Oral BID  . sulfamethoxazole-trimethoprim  300 mg Intravenous Q8H  . vitamin B-12  100 mcg Oral Daily    SUBJECTIVE: Samantha Kline dyspnea continues to improve. She is sitting in her bedside chair and walking around inside the room on room air. She seems more optimistic and energetic today as well.  Review of Systems: Review of Systems  Constitutional: Negative for fever and chills.  Respiratory: Positive for cough and shortness of breath. Negative for sputum production and  wheezing.   Cardiovascular: Negative for chest pain.  Gastrointestinal: Negative for nausea and diarrhea.  Neurological: Positive for weakness.    Past Medical History  Diagnosis Date  . Anemia   . Vitamin D deficiency   . Pneumonia 10/24/2015    Social History  Substance Use Topics  . Smoking status: Never Smoker   . Smokeless tobacco: Never Used  . Alcohol Use: Yes     Comment: 10/24/2015 "nothing since the 1980s"    History reviewed. No pertinent family history. No Known Allergies  OBJECTIVE: Filed Vitals:   10/30/15 2121 10/31/15 0510 10/31/15 0810 10/31/15 0900  BP: 129/86 134/89    Pulse: 105 88    Temp: 98 F (36.7 C) 98.2 F (36.8 C)    TempSrc:      Resp: 18 16    Height:      Weight:  58.6 kg (129 lb 3 oz)    SpO2: 97% 94% 99% 92%   Body mass index is 22.16 kg/(m^2).  Physical Exam GENERAL- Alert, thin, no acute distress HEENT- No cervical LAN, Moist oral mucosa CARDIAC- RRR, no murmurs RESP- CTAB, good air movement EXTREMITIES- symmetric, no pedal edema SKIN- Warm, dry, No rash or lesion   Lab Results Lab Results  Component Value Date   WBC 4.4 10/30/2015   HGB 9.2* 10/30/2015   HCT 29.2* 10/30/2015   MCV 90.4 10/30/2015   PLT 383 10/30/2015    Lab Results  Component Value Date   CREATININE 0.93 10/30/2015   BUN 15 10/30/2015   NA 130* 10/30/2015   K 4.9 10/30/2015   CL 97* 10/30/2015   CO2 25 10/30/2015    Lab Results  Component Value Date   ALT 14 10/25/2015   AST 34 10/25/2015   ALKPHOS 42 10/25/2015   BILITOT 0.4 10/25/2015     Microbiology: Recent Results (from the past 240 hour(s))  Blood culture (routine x 2)     Status: None   Collection Time: 10/24/15  3:10 AM  Result Value Ref Range Status   Specimen Description BLOOD LEFT ARM  Final   Special Requests BOTTLES DRAWN AEROBIC AND ANAEROBIC 5 ML  Final   Culture   Final    NO GROWTH 5 DAYS Performed at [facility]    Report Status 10/29/2015 FINAL  Final   Blood culture (routine x 2)     Status: None   Collection Time: 10/24/15  3:19 AM  Result Value Ref Range Status   Specimen Description BLOOD RIGHT ARM  Final   Special Requests BOTTLES DRAWN AEROBIC AND ANAEROBIC 5 ML  Final   Culture   Final    NO GROWTH 5 DAYS Performed at [facility]    Report Status 10/29/2015 FINAL  Final  Culture, sputum-assessment     Status: None   Collection Time: 10/24/15 12:10 PM  Result Value Ref Range Status   Specimen Description SPUTUM  Final   Special Requests NONE  Final   Sputum evaluation   Final    THIS SPECIMEN IS ACCEPTABLE. RESPIRATORY CULTURE REPORT TO FOLLOW.   Report Status 10/24/2015 FINAL  Final  Culture, respiratory (NON-Expectorated)     Status: None   Collection Time: 10/24/15 12:10 PM  Result Value Ref Range Status   Specimen Description SPUTUM  Final   Special Requests NONE  Final   Gram Stain   Final    ABUNDANT WBC PRESENT, PREDOMINANTLY PMN RARE SQUAMOUS EPITHELIAL CELLS PRESENT FEW GRAM POSITIVE COCCI IN PAIRS AND CHAINS RARE GRAM NEGATIVE RODS RARE GRAM POSITIVE RODS    Culture Consistent with normal respiratory flora.  Final   Report Status 10/26/2015 FINAL  Final     ASSESSMENT: Newly diagnosed HIV/AIDS: Samantha Kline has a very low absolute CD4 count and probable infection with pneumocystis pneumonia at her presentation today. She will benefit from early initiation of antiretroviral therapy and close follow up with ID clinic. She will also need continued prophylaxis for opportunistic infection while she remains so immunocompromised. Fortunately she seems to be more accepting of this diagnosis today and was able to have a decent discussion with me about the importance for follow up and treatment. She has apparently informed at least her husband. Viral load and susceptibilities remain pending at this time.  Community acquired pneumonia: This infection is presumed to be PCP given her history and presentation with  chronic illness since at least September of last year and indolent progressive dyspnea. Her sputum smear for PCP remains pending so this diagnosis cannot be fully confirmed at this time but she is improving quickly on empiric treatment. We will plan on continuing treatment for a total treatment duration of [redacted] weeks along with a prednisone taper, and can transition to oral Bactrim DS.  PLAN: 1. Continue Trimethprim-sulfamethoxazole for 3 weeks with prednisone (7/6-7/27)  2. Continue prophylaxis with azithromycin 1200mg  and fluconazole 100mg  weekly  Fuller Planhristopher W Rice, MD PGY-II Internal Medicine Resident Pager# [phone] 10/31/2015, 11:59  AM

## 2015-10-31 NOTE — Discharge Instructions (Addendum)
You will need to take several medications for treatment after returning home.  For the short term you will need to take the following: Bactrim DS TWO TABLETS THREE TIMES DAILY for 16 days plus prednisone 40 mg daily for 5 days then 30mg  daily for 11 days.  You will also need to start taking: azithromyin 2 tablets WEEKLY and fluconazole 1 tablet WEEKLY.  You also need to start taking: Descovy 1 pill DAILY plus Tivicay 1 pill DAILY  It is very important to take all these medicine as instructed particularly these daily pills since missing doses can worsen your progress in treating this virus. You will be called to set up an appointment with Dr. Orvan Falconerampbell in his clinic in the next 1-2 weeks.  It was a pleasure to meet you Samantha Kline!  I am glad to see you already start feeling better which we can expect to keep improving over the coming weeks.

## 2015-10-31 NOTE — Progress Notes (Signed)
SATURATION QUALIFICATIONS: (This note is used to comply with regulatory documentation for home oxygen)  Patient Saturations on Room Air at Rest = 100%  Patient Saturations on Room Air while Ambulating = 91%  Patient Saturations on na Liters of oxygen while Ambulating = na%  Please briefly explain why patient needs home oxygen: does not qualify

## 2015-10-31 NOTE — Progress Notes (Signed)
Physical Therapy Treatment Patient Details Name: Samantha Kline MRN: [id number] DOB: [birth year] Today's Date: 10/31/2015    History of Present Illness Samantha Kline is a 55yo woman who presented to the ED in [facility] for evaluation of cough, fever, progressive weakness, and fatigue.The patient has had unexplained weight loss, fatigue, decreased appetite with food aversion, and shortness of breath for several months. CT chest probable PNA, lesion on left kidney concerning for mass    PT Comments    Patient with improved activity tolerance this session. However, pt with elevated HR up to 151 with stair training and SpO2 87-95% on RA throughout session. Current plan remains appropriate.   Follow Up Recommendations  Home health PT;Supervision/Assistance - 24 hour     Equipment Recommendations    RW; 3 in 1   Recommendations for Other Services       Precautions / Restrictions Precautions Precautions: Fall Precaution Comments: monitor O2 Restrictions Weight Bearing Restrictions: No    Mobility  Bed Mobility Overal bed mobility: Modified Independent                Transfers Overall transfer level: Needs assistance Equipment used: None Transfers: Sit to/from Stand Sit to Stand: Supervision         General transfer comment: safe hand placement  Ambulation/Gait Ambulation/Gait assistance: Min guard Ambulation Distance (Feet): 100 Feet Assistive device: Rolling walker (2 wheeled) Gait Pattern/deviations: Step-through pattern;Decreased stride length;Narrow base of support Gait velocity: very slow pattern   General Gait Details: cues for increased gait velocity; carry over demonstrated from previous sessions; pt with wider BOS and safe use of AD; one standing rest break with SpO2 87% on RA and HR in 140s   Stairs Stairs: Yes Stairs assistance: Min guard Stair Management: One rail Right;Forwards Number of Stairs: 4 General stair comments: educated on sequencing and  energy conservation techniques; SpO2 remained 90% with ascend/descend and HR up to 151; standing rest break with cues for breathing technique  Wheelchair Mobility    Modified Rankin (Stroke Patients Only)       Balance     Sitting balance-Leahy Scale: Good       Standing balance-Leahy Scale: Fair                      Cognition Arousal/Alertness: Awake/alert Behavior During Therapy: Flat affect Overall Cognitive Status: Within Functional Limits for tasks assessed                      Exercises      General Comments General comments (skin integrity, edema, etc.): SpO2 95% on RA end of session; increased time and frequent rest breaks needed       Pertinent Vitals/Pain Pain Assessment: No/denies pain    Home Living                      Prior Function            PT Goals (current goals can now be found in the care plan section) Acute Rehab PT Goals Patient Stated Goal: be able to be active and independent again PT Goal Formulation: With patient Time For Goal Achievement: 11/08/15 Potential to Achieve Goals: Good Progress towards PT goals: Progressing toward goals    Frequency  Min 3X/week    PT Plan Current plan remains appropriate    Co-evaluation             End of Session Equipment Utilized During  Treatment: Gait belt;Oxygen Activity Tolerance: Patient tolerated treatment well Patient left: in chair;with call bell/phone within reach;with nursing/sitter in room     Time: 1014-1100 PT Time Calculation (min) (ACUTE ONLY): 46 min  Charges:  $Gait Training: 23-37 mins $Therapeutic Activity: 8-22 mins                    G Codes:      Derek Mound, PTA Pager: [phone]   10/31/2015, 1:09 PM

## 2015-11-02 LAB — HLA B*5701: HLA B 5701: NEGATIVE

## 2015-11-02 NOTE — Progress Notes (Signed)
           Received Fax from [facility]  Faxed H&P and HH orders requested to [facility]. Intake ID# [id number] to [phone]  For further assistance, or order clarification please contact Dr. Inda CastleJohn Campell who will following patient in the outpatient setting.  [facility] [address]  [address]  [place] [address]  [phone]

## 2015-11-06 ENCOUNTER — Inpatient Hospital Stay (HOSPITAL_COMMUNITY)
Admission: EM | Admit: 2015-11-06 | Discharge: 2015-11-25 | DRG: 640 | Disposition: A | Discharge status: Rehabilitation Facility | Payer: Managed Care, Other (non HMO) | Attending: Internal Medicine | Admitting: Internal Medicine

## 2015-11-06 ENCOUNTER — Emergency Department (HOSPITAL_COMMUNITY): Payer: Managed Care, Other (non HMO)

## 2015-11-06 ENCOUNTER — Encounter (HOSPITAL_COMMUNITY): Payer: Self-pay | Admitting: Emergency Medicine

## 2015-11-06 DIAGNOSIS — E871 Hypo-osmolality and hyponatremia: Secondary | ICD-10-CM | POA: Diagnosis not present

## 2015-11-06 DIAGNOSIS — E860 Dehydration: Secondary | ICD-10-CM | POA: Diagnosis present

## 2015-11-06 DIAGNOSIS — B59 Pneumocystosis: Secondary | ICD-10-CM | POA: Diagnosis present

## 2015-11-06 DIAGNOSIS — L99 Other disorders of skin and subcutaneous tissue in diseases classified elsewhere: Secondary | ICD-10-CM | POA: Diagnosis present

## 2015-11-06 DIAGNOSIS — E876 Hypokalemia: Secondary | ICD-10-CM | POA: Diagnosis not present

## 2015-11-06 DIAGNOSIS — E43 Unspecified severe protein-calorie malnutrition: Secondary | ICD-10-CM | POA: Diagnosis present

## 2015-11-06 DIAGNOSIS — R112 Nausea with vomiting, unspecified: Secondary | ICD-10-CM | POA: Diagnosis present

## 2015-11-06 DIAGNOSIS — D638 Anemia in other chronic diseases classified elsewhere: Secondary | ICD-10-CM | POA: Insufficient documentation

## 2015-11-06 DIAGNOSIS — T370X5A Adverse effect of sulfonamides, initial encounter: Secondary | ICD-10-CM | POA: Diagnosis present

## 2015-11-06 DIAGNOSIS — E559 Vitamin D deficiency, unspecified: Secondary | ICD-10-CM | POA: Diagnosis present

## 2015-11-06 DIAGNOSIS — B259 Cytomegaloviral disease, unspecified: Secondary | ICD-10-CM | POA: Diagnosis present

## 2015-11-06 DIAGNOSIS — Z79899 Other long term (current) drug therapy: Secondary | ICD-10-CM

## 2015-11-06 DIAGNOSIS — B258 Other cytomegaloviral diseases: Secondary | ICD-10-CM | POA: Diagnosis present

## 2015-11-06 DIAGNOSIS — R000 Tachycardia, unspecified: Secondary | ICD-10-CM | POA: Insufficient documentation

## 2015-11-06 DIAGNOSIS — G9341 Metabolic encephalopathy: Secondary | ICD-10-CM | POA: Diagnosis present

## 2015-11-06 DIAGNOSIS — F329 Major depressive disorder, single episode, unspecified: Secondary | ICD-10-CM | POA: Diagnosis present

## 2015-11-06 DIAGNOSIS — A419 Sepsis, unspecified organism: Secondary | ICD-10-CM

## 2015-11-06 DIAGNOSIS — A312 Disseminated mycobacterium avium-intracellulare complex (DMAC): Secondary | ICD-10-CM | POA: Diagnosis present

## 2015-11-06 DIAGNOSIS — R5381 Other malaise: Secondary | ICD-10-CM

## 2015-11-06 DIAGNOSIS — Z681 Body mass index (BMI) 19 or less, adult: Secondary | ICD-10-CM

## 2015-11-06 DIAGNOSIS — D649 Anemia, unspecified: Secondary | ICD-10-CM | POA: Diagnosis present

## 2015-11-06 DIAGNOSIS — B20 Human immunodeficiency virus [HIV] disease: Secondary | ICD-10-CM | POA: Diagnosis present

## 2015-11-06 DIAGNOSIS — R4702 Dysphasia: Secondary | ICD-10-CM | POA: Diagnosis not present

## 2015-11-06 DIAGNOSIS — K2950 Unspecified chronic gastritis without bleeding: Secondary | ICD-10-CM | POA: Diagnosis present

## 2015-11-06 DIAGNOSIS — R509 Fever, unspecified: Secondary | ICD-10-CM

## 2015-11-06 DIAGNOSIS — E440 Moderate protein-calorie malnutrition: Secondary | ICD-10-CM | POA: Diagnosis present

## 2015-11-06 DIAGNOSIS — J189 Pneumonia, unspecified organism: Secondary | ICD-10-CM

## 2015-11-06 DIAGNOSIS — D619 Aplastic anemia, unspecified: Secondary | ICD-10-CM

## 2015-11-06 DIAGNOSIS — K2210 Ulcer of esophagus without bleeding: Secondary | ICD-10-CM | POA: Diagnosis not present

## 2015-11-06 DIAGNOSIS — R627 Adult failure to thrive: Secondary | ICD-10-CM | POA: Diagnosis present

## 2015-11-06 DIAGNOSIS — L8990 Pressure ulcer of unspecified site, unspecified stage: Secondary | ICD-10-CM | POA: Diagnosis present

## 2015-11-06 DIAGNOSIS — R0682 Tachypnea, not elsewhere classified: Secondary | ICD-10-CM | POA: Insufficient documentation

## 2015-11-06 DIAGNOSIS — K208 Other esophagitis without bleeding: Secondary | ICD-10-CM

## 2015-11-06 DIAGNOSIS — B370 Candidal stomatitis: Secondary | ICD-10-CM | POA: Diagnosis present

## 2015-11-06 DIAGNOSIS — I959 Hypotension, unspecified: Secondary | ICD-10-CM | POA: Insufficient documentation

## 2015-11-06 DIAGNOSIS — G9340 Encephalopathy, unspecified: Secondary | ICD-10-CM | POA: Diagnosis present

## 2015-11-06 DIAGNOSIS — B3781 Candidal esophagitis: Secondary | ICD-10-CM

## 2015-11-06 DIAGNOSIS — E875 Hyperkalemia: Secondary | ICD-10-CM | POA: Diagnosis present

## 2015-11-06 DIAGNOSIS — R7989 Other specified abnormal findings of blood chemistry: Secondary | ICD-10-CM | POA: Diagnosis present

## 2015-11-06 HISTORY — DX: Human immunodeficiency virus (HIV) disease: B20

## 2015-11-06 HISTORY — DX: Asymptomatic human immunodeficiency virus (hiv) infection status: Z21

## 2015-11-06 LAB — CBC WITH DIFFERENTIAL/PLATELET
BAND NEUTROPHILS: 2 %
HEMATOCRIT: 36.5 % (ref 36.0–46.0)
Hemoglobin: 13.2 g/dL (ref 12.0–15.0)
LYMPHS PCT: 5 %
MCH: 30 pg (ref 26.0–34.0)
MCHC: 36.2 g/dL — AB (ref 30.0–36.0)
MCV: 83 fL (ref 78.0–100.0)
MONOS PCT: 4 %
NEUTROS PCT: 89 %
PLATELETS: 284 10*3/uL (ref 150–400)
RBC: 4.4 MIL/uL (ref 3.87–5.11)
RDW: 15.2 % (ref 11.5–15.5)
WBC: 6.2 10*3/uL (ref 4.0–10.5)

## 2015-11-06 LAB — URINALYSIS, ROUTINE W REFLEX MICROSCOPIC
BILIRUBIN URINE: NEGATIVE
Glucose, UA: NEGATIVE mg/dL
Hgb urine dipstick: NEGATIVE
KETONES UR: NEGATIVE mg/dL
Leukocytes, UA: NEGATIVE
NITRITE: NEGATIVE
PH: 6 (ref 5.0–8.0)
PROTEIN: 30 mg/dL — AB
Specific Gravity, Urine: 1.029 (ref 1.005–1.030)

## 2015-11-06 LAB — COMPREHENSIVE METABOLIC PANEL
ALBUMIN: 2.9 g/dL — AB (ref 3.5–5.0)
ALT: 27 U/L (ref 14–54)
AST: 36 U/L (ref 15–41)
Alkaline Phosphatase: 76 U/L (ref 38–126)
Anion gap: 10 (ref 5–15)
BUN: 24 mg/dL — AB (ref 6–20)
CHLORIDE: 82 mmol/L — AB (ref 101–111)
CO2: 18 mmol/L — AB (ref 22–32)
CREATININE: 1 mg/dL (ref 0.44–1.00)
Calcium: 8.9 mg/dL (ref 8.9–10.3)
GFR calc Af Amer: >60 mL/min (ref >60)
GFR calc non Af Amer: >60 mL/min (ref >60)
GLUCOSE: 161 mg/dL — AB (ref 65–99)
POTASSIUM: 5.3 mmol/L — AB (ref 3.5–5.1)
SODIUM: 110 mmol/L — AB (ref 135–145)
Total Bilirubin: 0.6 mg/dL (ref 0.3–1.2)
Total Protein: 8.1 g/dL (ref 6.5–8.1)

## 2015-11-06 LAB — URINE MICROSCOPIC-ADD ON

## 2015-11-06 LAB — I-STAT CG4 LACTIC ACID, ED: Lactic Acid, Venous: 2.65 mmol/L (ref 0.5–1.9)

## 2015-11-06 LAB — I-STAT BETA HCG BLOOD, ED (MC, WL, AP ONLY)

## 2015-11-06 LAB — I-STAT TROPONIN, ED: Troponin i, poc: 0 ng/mL (ref 0.00–0.08)

## 2015-11-06 MED ORDER — SODIUM CHLORIDE 0.9 % IV SOLN
Freq: Once | INTRAVENOUS | Status: AC
  Administered 2015-11-07: via INTRAVENOUS

## 2015-11-06 MED ORDER — SODIUM CHLORIDE 0.9 % IV BOLUS (SEPSIS)
1000.0000 mL | Freq: Once | INTRAVENOUS | Status: AC
  Administered 2015-11-06: 1000 mL via INTRAVENOUS

## 2015-11-06 NOTE — ED Provider Notes (Signed)
CSN: [id number]     Arrival date & time 11/06/15  2025 History   First MD Initiated Contact with Patient 11/06/15 2158     Chief Complaint  Patient presents with  . Altered Mental Status  . Emesis     (Consider location/radiation/quality/duration/timing/severity/associated sxs/prior Treatment) HPI Samantha Kline is a 55 y.o. female with hx of anemia, recently diagnosed HIV and pneumonia with admission 2 wks ago, presents to ED with complaint of worsening weakness. Pt was discharged 1 week ago, family state she was doing better. She was admitted for pneumonia, weakness, weight loss, and while in the hostpital she was diagnosed with hiv. She was discharged on bactrim, prednisone, and HIV medications. Per husband, pt was doing well intially, but 4 days ago started to decline. Pt developed increased weakness, loss of appetite, several episodes of emesis, drooling, confusion. Pt unable to speak or ambulate today.   Past Medical History  Diagnosis Date  . Anemia   . Vitamin D deficiency   . Pneumonia 10/24/2015  . HIV (human immunodeficiency virus infection) (HCC)     11/06/15 Family states it was just diagnosed this week   Past Surgical History  Procedure Laterality Date  . Tubal ligation  1980s   No family history on file. Social History  Substance Use Topics  . Smoking status: Never Smoker   . Smokeless tobacco: Never Used  . Alcohol Use: Yes     Comment: 10/24/2015 "nothing since the 1980s"   OB History    No data available     Review of Systems  Constitutional: Positive for fatigue and unexpected weight change. Negative for fever and chills.  Respiratory: Positive for cough.   Gastrointestinal: Positive for nausea and vomiting. Negative for diarrhea.  Musculoskeletal: Negative for joint swelling, neck pain and neck stiffness.  Skin: Negative for rash.  Neurological: Positive for speech difficulty and weakness. Negative for dizziness.  All other systems reviewed and are  negative.     Allergies  Review of patient's allergies indicates no known allergies.  Home Medications   Prior to Admission medications   Medication Sig Start Date End Date Taking? Authorizing Provider  azithromycin (ZITHROMAX) 600 MG tablet Take 2 tablets (1,200 mg total) by mouth once a week. Patient taking differently: Take 1,200 mg by mouth every Friday.  10/31/15  Yes Jeralyn BennettEzequiel Zamora, MD  dolutegravir (TIVICAY) 50 MG tablet Take 1 tablet (50 mg total) by mouth daily. Patient taking differently: Take 50 mg by mouth daily at 12 noon.  10/31/15  Yes Jeralyn BennettEzequiel Zamora, MD  emtricitabine-tenofovir AF (DESCOVY) 200-25 MG tablet Take 1 tablet by mouth daily. Patient taking differently: Take 1 tablet by mouth daily at 12 noon.  10/31/15  Yes Jeralyn BennettEzequiel Zamora, MD  ferrous sulfate 325 (65 FE) MG tablet Take 325 mg by mouth daily with breakfast.   Yes Historical Provider, MD  fluconazole (DIFLUCAN) 100 MG tablet Take 1 tablet (100 mg total) by mouth once a week. Patient taking differently: Take 100 mg by mouth every Friday.  11/04/15  Yes Jeralyn BennettEzequiel Zamora, MD  predniSONE (DELTASONE) 20 MG tablet Take 40 mg PO q daily for 5 days, followed by 30 mg PO q daily for 11 days. Qty Sufficient Patient taking differently: Take 30-40 mg by mouth daily at 12 noon. Take 2 tablets (40 mg) by mouth daily at noon for 5 days, then take 1 1/2 tablets (30 mg) daily for 11 days, then stop 10/31/15  Yes Jeralyn BennettEzequiel Zamora, MD  sulfamethoxazole-trimethoprim (BACTRIM DS,SEPTRA  DS) 800-160 MG tablet 2 tabs PO TID x 16 days, then 1 tab PO q daily thereafter Qty Sufficient Patient taking differently: Take 1-23 tablets by mouth See admin instructions. Take 2 tablets by mouth 3 times daily for 16 days, then take 1 tablet daily. 10/31/15  Yes Jeralyn Bennett, MD  vitamin B-12 (CYANOCOBALAMIN) 100 MCG tablet Take 100 mcg by mouth daily.   Yes Historical Provider, MD  Vitamin D, Ergocalciferol, (DRISDOL) 50000 units CAPS capsule Take  50,000 Units by mouth every Friday.  10/03/15  Yes Historical Provider, MD   BP 139/101 mmHg  Pulse 97  SpO2 100% Physical Exam  Constitutional: She appears well-developed and well-nourished. No distress.  HENT:  Head: Normocephalic.  Pt is drooling, mouth full of saliva   Eyes: Conjunctivae are normal.  Neck: Neck supple.  Cardiovascular: Normal rate, regular rhythm and normal heart sounds.   Pulmonary/Chest: Effort normal and breath sounds normal. No respiratory distress. She has no wheezes. She has no rales.  Abdominal: Soft. Bowel sounds are normal. She exhibits no distension. There is no tenderness. There is no rebound.  Musculoskeletal: She exhibits no edema.  Neurological: She is alert.  Unable to speak. Opens her eyes and looks around and at you. 4/5 and equal upper and lower extremity strength bilaterally. Squeezes hands on command and moves her feet. Unable to hold arm or leg up on her own.   Skin: Skin is warm and dry.  Psychiatric: She has a normal mood and affect. Her behavior is normal.  Nursing note and vitals reviewed.   ED Course  Procedures (including critical care time) Labs Review Labs Reviewed  COMPREHENSIVE METABOLIC PANEL - Abnormal; Notable for the following:    Sodium 110 (*)    Potassium 5.3 (*)    Chloride 82 (*)    CO2 18 (*)    Glucose, Bld 161 (*)    BUN 24 (*)    Albumin 2.9 (*)    All other components within normal limits  CBC WITH DIFFERENTIAL/PLATELET - Abnormal; Notable for the following:    MCHC 36.2 (*)    All other components within normal limits  I-STAT CG4 LACTIC ACID, ED - Abnormal; Notable for the following:    Lactic Acid, Venous 2.65 (*)    All other components within normal limits  CULTURE, BLOOD (ROUTINE X 2)  CULTURE, BLOOD (ROUTINE X 2)  URINE CULTURE  URINALYSIS, ROUTINE W REFLEX MICROSCOPIC (NOT AT [facility])  I-STAT BETA HCG BLOOD, ED ([facility], [facility], [facility] ONLY)  I-STAT TROPOININ, ED    Imaging Review Dg Chest 1 View  11/06/2015   CLINICAL DATA:  Altered mental status. EXAM: CHEST 1 VIEW COMPARISON:  September 29, 2015 FINDINGS: The heart size and mediastinal contours are within normal limits. Both lungs are clear. The visualized skeletal structures are unremarkable. IMPRESSION: No acute abnormalities. Electronically Signed   By: Gerome Sam III M.D   On: 11/06/2015 22:20   I have personally reviewed and evaluated these images and lab results as part of my medical decision-making.   EKG Interpretation   Date/Time:  Sunday November 06 2015 22:20:20 EDT Ventricular Rate:  103 PR Interval:    QRS Duration: 81 QT Interval:  319 QTC Calculation: 418 R Axis:   42 Text Interpretation:  Sinus tachycardia Left ventricular hypertrophy  Anterior infarct, old Baseline wander in lead(s) V2 Confirmed by ZACKOWSKI   MD, SCOTT [phone]) on 11/06/2015 10:37:55 PM      MDM   Final diagnoses:  Hyponatremia  Acute encephalopathy    10:23 PM Labs showed hyponatremia. Sodium of 110. ECG, trop added. Ordered temperature -hasnt been done yet. Also pending cxr, CT head. Pt is alert, follows symple directions, drooling, unable to speak. Will monitor closely. Saline ordered.   12:29 AM Pt received IV NS, feels better, now able to talk and oritented x3. I initially discussed case with [facility] hospitalist, who recommended calling Critical care and nephrology. I spoke with Dr. Briant Cedar, who advised to hydrate pt, and recheck sodium. Call him back if not improving or worsening symptoms. Spoke with Critical Care, advised pt can be admitted to medicine to step down. Discussed again with Dr. Clyde Lundborg, will admit.   Filed Vitals:   11/07/15 0400 11/07/15 0430 11/07/15 0500 11/07/15 0530  BP: 138/93 129/93 136/93 136/93  Pulse:  90 96 100  Temp:      TempSrc:      Resp: SpO2:  100% 99% 96%     Jaynie Crumble, PA-C 11/07/15 0617  Azalia Bilis, MD 11/07/15 [phone]

## 2015-11-06 NOTE — ED Notes (Addendum)
Family reports altered mental status, nausea, and vomiting x 2 days.  Pt has non-productive cough.  Family states pt was recently admitted a couple weeks ago for pneumonia.  Pt denies pain at present.  Diagnosed with HIV this past week.

## 2015-11-07 ENCOUNTER — Encounter (HOSPITAL_COMMUNITY): Payer: Self-pay | Admitting: Internal Medicine

## 2015-11-07 DIAGNOSIS — E875 Hyperkalemia: Secondary | ICD-10-CM | POA: Diagnosis present

## 2015-11-07 DIAGNOSIS — D649 Anemia, unspecified: Secondary | ICD-10-CM | POA: Diagnosis present

## 2015-11-07 DIAGNOSIS — D638 Anemia in other chronic diseases classified elsewhere: Secondary | ICD-10-CM | POA: Diagnosis present

## 2015-11-07 DIAGNOSIS — A419 Sepsis, unspecified organism: Secondary | ICD-10-CM | POA: Insufficient documentation

## 2015-11-07 DIAGNOSIS — B259 Cytomegaloviral disease, unspecified: Secondary | ICD-10-CM | POA: Diagnosis present

## 2015-11-07 DIAGNOSIS — R5381 Other malaise: Secondary | ICD-10-CM | POA: Diagnosis not present

## 2015-11-07 DIAGNOSIS — R509 Fever, unspecified: Secondary | ICD-10-CM | POA: Diagnosis not present

## 2015-11-07 DIAGNOSIS — Z515 Encounter for palliative care: Secondary | ICD-10-CM | POA: Diagnosis not present

## 2015-11-07 DIAGNOSIS — B370 Candidal stomatitis: Secondary | ICD-10-CM | POA: Diagnosis present

## 2015-11-07 DIAGNOSIS — R7989 Other specified abnormal findings of blood chemistry: Secondary | ICD-10-CM | POA: Diagnosis present

## 2015-11-07 DIAGNOSIS — I10 Essential (primary) hypertension: Secondary | ICD-10-CM | POA: Diagnosis not present

## 2015-11-07 DIAGNOSIS — W19XXXD Unspecified fall, subsequent encounter: Secondary | ICD-10-CM | POA: Diagnosis not present

## 2015-11-07 DIAGNOSIS — B59 Pneumocystosis: Secondary | ICD-10-CM | POA: Diagnosis present

## 2015-11-07 DIAGNOSIS — E871 Hypo-osmolality and hyponatremia: Secondary | ICD-10-CM | POA: Diagnosis present

## 2015-11-07 DIAGNOSIS — J189 Pneumonia, unspecified organism: Secondary | ICD-10-CM | POA: Diagnosis not present

## 2015-11-07 DIAGNOSIS — Z79899 Other long term (current) drug therapy: Secondary | ICD-10-CM | POA: Diagnosis not present

## 2015-11-07 DIAGNOSIS — Z681 Body mass index (BMI) 19 or less, adult: Secondary | ICD-10-CM | POA: Diagnosis not present

## 2015-11-07 DIAGNOSIS — E559 Vitamin D deficiency, unspecified: Secondary | ICD-10-CM | POA: Diagnosis present

## 2015-11-07 DIAGNOSIS — L99 Other disorders of skin and subcutaneous tissue in diseases classified elsewhere: Secondary | ICD-10-CM | POA: Diagnosis present

## 2015-11-07 DIAGNOSIS — K209 Esophagitis, unspecified: Secondary | ICD-10-CM | POA: Diagnosis not present

## 2015-11-07 DIAGNOSIS — R112 Nausea with vomiting, unspecified: Secondary | ICD-10-CM | POA: Diagnosis present

## 2015-11-07 DIAGNOSIS — R630 Anorexia: Secondary | ICD-10-CM | POA: Diagnosis not present

## 2015-11-07 DIAGNOSIS — E43 Unspecified severe protein-calorie malnutrition: Secondary | ICD-10-CM | POA: Diagnosis present

## 2015-11-07 DIAGNOSIS — I959 Hypotension, unspecified: Secondary | ICD-10-CM | POA: Diagnosis not present

## 2015-11-07 DIAGNOSIS — S63283A Dislocation of proximal interphalangeal joint of left middle finger, initial encounter: Secondary | ICD-10-CM | POA: Diagnosis not present

## 2015-11-07 DIAGNOSIS — R627 Adult failure to thrive: Secondary | ICD-10-CM | POA: Diagnosis not present

## 2015-11-07 DIAGNOSIS — L8990 Pressure ulcer of unspecified site, unspecified stage: Secondary | ICD-10-CM

## 2015-11-07 DIAGNOSIS — D61810 Antineoplastic chemotherapy induced pancytopenia: Secondary | ICD-10-CM | POA: Diagnosis not present

## 2015-11-07 DIAGNOSIS — A312 Disseminated mycobacterium avium-intracellulare complex (DMAC): Secondary | ICD-10-CM | POA: Diagnosis present

## 2015-11-07 DIAGNOSIS — R1310 Dysphagia, unspecified: Secondary | ICD-10-CM | POA: Diagnosis not present

## 2015-11-07 DIAGNOSIS — G9340 Encephalopathy, unspecified: Secondary | ICD-10-CM | POA: Diagnosis not present

## 2015-11-07 DIAGNOSIS — R4702 Dysphasia: Secondary | ICD-10-CM | POA: Diagnosis not present

## 2015-11-07 DIAGNOSIS — B258 Other cytomegaloviral diseases: Secondary | ICD-10-CM | POA: Diagnosis present

## 2015-11-07 DIAGNOSIS — K208 Other esophagitis: Secondary | ICD-10-CM | POA: Diagnosis not present

## 2015-11-07 DIAGNOSIS — D702 Other drug-induced agranulocytosis: Secondary | ICD-10-CM | POA: Diagnosis not present

## 2015-11-07 DIAGNOSIS — K2210 Ulcer of esophagus without bleeding: Secondary | ICD-10-CM | POA: Diagnosis not present

## 2015-11-07 DIAGNOSIS — B20 Human immunodeficiency virus [HIV] disease: Secondary | ICD-10-CM | POA: Diagnosis present

## 2015-11-07 DIAGNOSIS — E876 Hypokalemia: Secondary | ICD-10-CM | POA: Diagnosis not present

## 2015-11-07 DIAGNOSIS — E440 Moderate protein-calorie malnutrition: Secondary | ICD-10-CM | POA: Diagnosis not present

## 2015-11-07 DIAGNOSIS — B3781 Candidal esophagitis: Secondary | ICD-10-CM | POA: Diagnosis present

## 2015-11-07 DIAGNOSIS — N390 Urinary tract infection, site not specified: Secondary | ICD-10-CM | POA: Diagnosis not present

## 2015-11-07 DIAGNOSIS — G9341 Metabolic encephalopathy: Secondary | ICD-10-CM | POA: Diagnosis present

## 2015-11-07 DIAGNOSIS — D521 Drug-induced folate deficiency anemia: Secondary | ICD-10-CM | POA: Diagnosis not present

## 2015-11-07 DIAGNOSIS — T370X5A Adverse effect of sulfonamides, initial encounter: Secondary | ICD-10-CM | POA: Diagnosis present

## 2015-11-07 DIAGNOSIS — G9349 Other encephalopathy: Secondary | ICD-10-CM | POA: Diagnosis not present

## 2015-11-07 DIAGNOSIS — Z7189 Other specified counseling: Secondary | ICD-10-CM | POA: Diagnosis not present

## 2015-11-07 DIAGNOSIS — W19XXXA Unspecified fall, initial encounter: Secondary | ICD-10-CM | POA: Diagnosis not present

## 2015-11-07 DIAGNOSIS — K2950 Unspecified chronic gastritis without bleeding: Secondary | ICD-10-CM | POA: Diagnosis present

## 2015-11-07 LAB — BASIC METABOLIC PANEL
ANION GAP: 9 (ref 5–15)
Anion gap: 11 (ref 5–15)
Anion gap: 8 (ref 5–15)
BUN: 21 mg/dL — ABNORMAL HIGH (ref 6–20)
BUN: 16 mg/dL (ref 6–20)
BUN: 17 mg/dL (ref 6–20)
CALCIUM: 8.7 mg/dL — AB (ref 8.9–10.3)
CALCIUM: 8.3 mg/dL — AB (ref 8.9–10.3)
CHLORIDE: 86 mmol/L — AB (ref 101–111)
CHLORIDE: 89 mmol/L — AB (ref 101–111)
CO2: 17 mmol/L — AB (ref 22–32)
CO2: 20 mmol/L — ABNORMAL LOW (ref 22–32)
CO2: 19 mmol/L — ABNORMAL LOW (ref 22–32)
CREATININE: 0.89 mg/dL (ref 0.44–1.00)
CREATININE: 0.8 mg/dL (ref 0.44–1.00)
Calcium: 8.6 mg/dL — ABNORMAL LOW (ref 8.9–10.3)
Chloride: 88 mmol/L — ABNORMAL LOW (ref 101–111)
Creatinine, Ser: 0.86 mg/dL (ref 0.44–1.00)
GFR calc Af Amer: >60 mL/min (ref >60)
GFR calc Af Amer: >60 mL/min (ref >60)
GFR calc non Af Amer: >60 mL/min (ref >60)
GLUCOSE: 98 mg/dL (ref 65–99)
Glucose, Bld: 114 mg/dL — ABNORMAL HIGH (ref 65–99)
Glucose, Bld: 131 mg/dL — ABNORMAL HIGH (ref 65–99)
POTASSIUM: 5.6 mmol/L — AB (ref 3.5–5.1)
Potassium: 5 mmol/L (ref 3.5–5.1)
Potassium: 4.9 mmol/L (ref 3.5–5.1)
SODIUM: 114 mmol/L — AB (ref 135–145)
SODIUM: 117 mmol/L — AB (ref 135–145)
SODIUM: 116 mmol/L — AB (ref 135–145)

## 2015-11-07 LAB — CBC
HCT: 35.5 % — ABNORMAL LOW (ref 36.0–46.0)
Hemoglobin: 12.7 g/dL (ref 12.0–15.0)
MCH: 30.2 pg (ref 26.0–34.0)
MCHC: 35.8 g/dL (ref 30.0–36.0)
MCV: 84.3 fL (ref 78.0–100.0)
PLATELETS: 247 10*3/uL (ref 150–400)
RBC: 4.21 MIL/uL (ref 3.87–5.11)
RDW: 15.7 % — ABNORMAL HIGH (ref 11.5–15.5)
WBC: 5 10*3/uL (ref 4.0–10.5)

## 2015-11-07 LAB — PROTIME-INR
INR: 1.74 — AB (ref 0.00–1.49)
Prothrombin Time: 20.4 seconds — ABNORMAL HIGH (ref 11.6–15.2)

## 2015-11-07 LAB — I-STAT CG4 LACTIC ACID, ED: Lactic Acid, Venous: 2.69 mmol/L (ref 0.5–1.9)

## 2015-11-07 LAB — TSH: TSH: 1.26 u[IU]/mL (ref 0.350–4.500)

## 2015-11-07 LAB — LIPASE, BLOOD: LIPASE: 22 U/L (ref 11–51)

## 2015-11-07 LAB — PROCALCITONIN: Procalcitonin: 0.32 ng/mL

## 2015-11-07 LAB — LACTIC ACID, PLASMA
LACTIC ACID, VENOUS: 1.4 mmol/L (ref 0.5–1.9)
Lactic Acid, Venous: 1.4 mmol/L (ref 0.5–1.9)

## 2015-11-07 LAB — SODIUM, URINE, RANDOM: SODIUM UR: 61 mmol/L

## 2015-11-07 LAB — OSMOLALITY: Osmolality: 253 mOsm/kg — ABNORMAL LOW (ref 275–295)

## 2015-11-07 LAB — OSMOLALITY, URINE: Osmolality, Ur: 484 mOsm/kg (ref 300–900)

## 2015-11-07 MED ORDER — ALBUTEROL SULFATE (2.5 MG/3ML) 0.083% IN NEBU: 2.5000 mg | INHALATION_SOLUTION | Freq: Four times a day (QID) | RESPIRATORY_TRACT | Status: DC | PRN

## 2015-11-07 MED ORDER — VITAMIN D (ERGOCALCIFEROL) 1.25 MG (50000 UNIT) PO CAPS
50000.0000 [IU] | ORAL_CAPSULE | ORAL | Status: DC
  Administered 2015-11-11 – 2015-11-25 (×3): 50000 [IU] via ORAL
  Filled 2015-11-07 (×3): qty 1

## 2015-11-07 MED ORDER — SODIUM CHLORIDE 0.9 % IV SOLN: INTRAVENOUS | Status: DC

## 2015-11-07 MED ORDER — FLUCONAZOLE 100 MG PO TABS
100.0000 mg | ORAL_TABLET | ORAL | Status: DC
  Administered 2015-11-11: 100 mg via ORAL
  Filled 2015-11-07: qty 1

## 2015-11-07 MED ORDER — DOLUTEGRAVIR SODIUM 50 MG PO TABS
50.0000 mg | ORAL_TABLET | Freq: Every day | ORAL | Status: DC
  Administered 2015-11-07 – 2015-11-25 (×19): 50 mg via ORAL
  Filled 2015-11-07 (×19): qty 1

## 2015-11-07 MED ORDER — SODIUM CHLORIDE 0.9 % IV SOLN
INTRAVENOUS | Status: DC
  Administered 2015-11-07 (×2): via INTRAVENOUS

## 2015-11-07 MED ORDER — EMTRICITABINE-TENOFOVIR AF 200-25 MG PO TABS
1.0000 | ORAL_TABLET | Freq: Every day | ORAL | Status: DC
  Administered 2015-11-07 – 2015-11-25 (×19): 1 via ORAL
  Filled 2015-11-07 (×20): qty 1

## 2015-11-07 MED ORDER — ACETAMINOPHEN 650 MG RE SUPP
650.0000 mg | Freq: Four times a day (QID) | RECTAL | Status: DC | PRN
  Administered 2015-11-16 – 2015-11-18 (×8): 650 mg via RECTAL
  Filled 2015-11-07 (×11): qty 1

## 2015-11-07 MED ORDER — FERROUS SULFATE 325 (65 FE) MG PO TABS
325.0000 mg | ORAL_TABLET | Freq: Every day | ORAL | Status: DC
  Administered 2015-11-08 – 2015-11-25 (×16): 325 mg via ORAL
  Filled 2015-11-07 (×17): qty 1

## 2015-11-07 MED ORDER — SODIUM POLYSTYRENE SULFONATE 15 GM/60ML PO SUSP
30.0000 g | Freq: Once | ORAL | Status: DC
  Filled 2015-11-07: qty 120

## 2015-11-07 MED ORDER — ENSURE ENLIVE PO LIQD
237.0000 mL | Freq: Three times a day (TID) | ORAL | Status: DC
  Administered 2015-11-07 – 2015-11-18 (×27): 237 mL via ORAL
  Filled 2015-11-07: qty 237

## 2015-11-07 MED ORDER — SULFAMETHOXAZOLE-TRIMETHOPRIM 800-160 MG PO TABS
2.0000 | ORAL_TABLET | Freq: Three times a day (TID) | ORAL | Status: DC
Start: 2015-11-07 — End: 2015-11-08
  Administered 2015-11-07 – 2015-11-08 (×3): 2 via ORAL
  Filled 2015-11-07 (×6): qty 2

## 2015-11-07 MED ORDER — DM-GUAIFENESIN ER 30-600 MG PO TB12
1.0000 | ORAL_TABLET | Freq: Two times a day (BID) | ORAL | Status: DC | PRN
  Filled 2015-11-07 (×2): qty 1

## 2015-11-07 MED ORDER — ONDANSETRON HCL 4 MG/2ML IJ SOLN
4.0000 mg | Freq: Three times a day (TID) | INTRAMUSCULAR | Status: DC | PRN
  Administered 2015-11-11 – 2015-11-18 (×3): 4 mg via INTRAVENOUS
  Filled 2015-11-07 (×4): qty 2

## 2015-11-07 MED ORDER — ACETAMINOPHEN 325 MG PO TABS
650.0000 mg | ORAL_TABLET | Freq: Four times a day (QID) | ORAL | Status: DC | PRN
  Administered 2015-11-10 – 2015-11-16 (×8): 650 mg via ORAL
  Filled 2015-11-07 (×12): qty 2

## 2015-11-07 MED ORDER — SULFAMETHOXAZOLE-TRIMETHOPRIM 800-160 MG PO TABS: 1.0000 | ORAL_TABLET | Freq: Every day | ORAL | Status: DC

## 2015-11-07 MED ORDER — ENOXAPARIN SODIUM 40 MG/0.4ML ~~LOC~~ SOLN
40.0000 mg | SUBCUTANEOUS | Status: DC
  Administered 2015-11-07 – 2015-11-14 (×8): 40 mg via SUBCUTANEOUS
  Filled 2015-11-07 (×9): qty 0.4

## 2015-11-07 MED ORDER — AZITHROMYCIN 600 MG PO TABS
1200.0000 mg | ORAL_TABLET | ORAL | Status: DC
  Administered 2015-11-11: 1200 mg via ORAL
  Filled 2015-11-07: qty 2

## 2015-11-07 MED ORDER — SODIUM CHLORIDE 0.9 % IV SOLN
Freq: Once | INTRAVENOUS | Status: AC
  Administered 2015-11-07: 03:00:00 via INTRAVENOUS

## 2015-11-07 MED ORDER — PREDNISONE 20 MG PO TABS
30.0000 mg | ORAL_TABLET | Freq: Every day | ORAL | Status: DC
  Administered 2015-11-07 – 2015-11-11 (×5): 30 mg via ORAL
  Filled 2015-11-07: qty 2
  Filled 2015-11-07: qty 1
  Filled 2015-11-07: qty 2
  Filled 2015-11-07: qty 1
  Filled 2015-11-07: qty 2

## 2015-11-07 MED ORDER — VITAMIN B-12 100 MCG PO TABS
100.0000 ug | ORAL_TABLET | Freq: Every day | ORAL | Status: DC
  Administered 2015-11-07 – 2015-11-25 (×16): 100 ug via ORAL
  Filled 2015-11-07 (×19): qty 1

## 2015-11-07 MED ORDER — SODIUM CHLORIDE 0.9% FLUSH
3.0000 mL | Freq: Two times a day (BID) | INTRAVENOUS | Status: DC
  Administered 2015-11-07 – 2015-11-25 (×17): 3 mL via INTRAVENOUS

## 2015-11-07 MED ORDER — SULFAMETHOXAZOLE-TRIMETHOPRIM 800-160 MG PO TABS: 1.0000 | ORAL_TABLET | ORAL | Status: DC

## 2015-11-07 NOTE — Evaluation (Signed)
Clinical/Bedside Swallow Evaluation Patient Details  Name: Samantha Kline MRN: [id number] Date of Birth: [birth year]  Today's Date: 11/07/2015 Time: SLP Start Time (ACUTE ONLY): 1121 SLP Stop Time (ACUTE ONLY): 1145 SLP Time Calculation (min) (ACUTE ONLY): 24 min  Past Medical History:  Past Medical History  Diagnosis Date  . Anemia   . Vitamin D deficiency   . Pneumonia 10/24/2015  . HIV (human immunodeficiency virus infection) (HCC)     11/06/15 Family states it was just diagnosed this week   Past Surgical History:  Past Surgical History  Procedure Laterality Date  . Tubal ligation  1980s   HPI:  Samantha Kline is a 55 y.o. female with medical history significant of recently diagnosed HIV, recently treated pneumonia, who presents with altered mental status, generalized weakness, nausea and vomiting.Head CT and CXR negative. 50 lb unintentional weight loss over the past 5 - 6 months along with decreased appetite.   Assessment / Plan / Recommendation Clinical Impression  Patient presents with a moderate oral phase dysphagia appearing to have both strength and cognitive origins. Patient with significant generalized oral weakness. Voice with low intensity and patient with dysarthric speech at baseline. Additonally lingual thrush noted. Patient does not report pain but "irritation to mouth and throat" which is likely impacting function.  Oral phase prolonged with periods of oral holding and intermittent difficulty drawing liquids via straw raising suspicion for cognitive component. Despite above, patient without overt indication of aspiration however severity of deficits will have a significant impact on efficiency of swallow increasing risk for dehydration/malnutrition. Note that strength of phonation, oral transit time, and initiation of swallow all improved over time coinciding with an improvement in general sustained attention to task and conversation. SLP will initiate diet and f/u for tolerance  and additional needs.     Aspiration Risk  Mild aspiration risk    Diet Recommendation Dysphagia 3 (Mech soft);Thin liquid   Liquid Administration via: Cup;Straw Medication Administration: Crushed with puree Supervision: Staff to assist with self feeding;Full supervision/cueing for compensatory strategies Compensations: Small sips/bites;Minimize environmental distractions Postural Changes: Seated upright at 90 degrees    Other  Recommendations Oral Care Recommendations: Oral care BID   Follow up Recommendations  None    Frequency and Duration min 2x/week  1 week       Prognosis Prognosis for Safe Diet Advancement: Fair      Swallow Study   General HPI: Samantha Kline is a 55 y.o. female with medical history significant of recently diagnosed HIV, recently treated pneumonia, who presents with altered mental status, generalized weakness, nausea and vomiting.Head CT and CXR negative. 50 lb unintentional weight loss over the past 5 - 6 months along with decreased appetite. Type of Study: Bedside Swallow Evaluation Previous Swallow Assessment: none Diet Prior to this Study: NPO Temperature Spikes Noted: No Respiratory Status: Room air History of Recent Intubation: No Behavior/Cognition: Alert Oral Cavity Assessment: Other (comment) (lingual thrush ) Oral Care Completed by SLP: Recent completion by staff Oral Cavity - Dentition: Adequate natural dentition Vision: Functional for self-feeding Self-Feeding Abilities: Able to feed self Patient Positioning: Upright in bed Baseline Vocal Quality: Low vocal intensity Volitional Cough: Weak Volitional Swallow: Able to elicit    Oral/Motor/Sensory Function Overall Oral Motor/Sensory Function: Generalized oral weakness (significant generalized weakness)   Ice Chips Ice chips: Not tested   Thin Liquid Thin Liquid: Impaired Presentation: Cup;Self Fed;Straw Oral Phase Impairments: Reduced labial seal Oral Phase Functional Implications:  Oral holding;Prolonged oral transit    Nectar  Thick Nectar Thick Liquid: Not tested   Honey Thick Honey Thick Liquid: Not tested   Puree Puree: Impaired Presentation: Spoon Oral Phase Impairments: Impaired mastication Oral Phase Functional Implications: Prolonged oral transit;Oral holding   Solid   GO   Solid: Impaired Oral Phase Impairments: Impaired mastication Oral Phase Functional Implications: Prolonged oral transit       Samantha Cartelli MA, CCC-SLP [phone]  Samantha Kline 11/07/2015,1:55 PM

## 2015-11-07 NOTE — ED Notes (Signed)
Dr.NIU at bedside. 

## 2015-11-07 NOTE — Progress Notes (Addendum)
Initial Nutrition Assessment  DOCUMENTATION CODES:   Non-severe (moderate) malnutrition in context of chronic illness  INTERVENTION:   Ensure Enlive po TID, each supplement provides 350 kcal and 20 grams of protein  NUTRITION DIAGNOSIS:   Malnutrition related to chronic illness as evidenced by moderate depletion of body fat, moderate depletions of muscle mass  GOAL:   Patient will meet greater than or equal to 90% of their needs  MONITOR:   Diet advancement, PO intake, Supplement acceptance, Labs, Weight trends, I & O's  REASON FOR ASSESSMENT:   Consult Assessment of nutrition requirement/status  ASSESSMENT:   Patient was recently hospitalized from 7/3-7/10 and had new diagnosis of HIV and pneumonia. She was started with anti-HIV medications. Patient was discharged on bactrim and prednisone with concerns of PCP. Per husband, pt was doing well intially, but 4 days ago she started to decline. Pt developed increased generalized weakness, loss of appetite, decreased oral intake, nauseas, vomiting and confusion. She vomited twice without blood in the vomitus today. Patient does not have abdominal pain or diarrhea. She was initially very confused with AMS, which improved with IV NS bolus, and became oriented x 3. She continues to have dry cough and mild shortness of breath, but no chest pain. No fever or chills. Patient denies symptoms of UTI or unilateral weakness. When I saw pt in ED, she is oriented x 3 and looks very weak, moves all extremities. No active nausea, vomiting, abdominal pain or chest pain.  Patient minimally conversant upon interview; sleepy. Pt seen per Clinical Nutrition during most recent hospitalization. Dx with moderate malnutrition which is ongoing. Pt was receiving (and drinking) Ensure Enlive supplements during recent hospital stay. S/p swallow evaluation per Speech Path >> SLP recommending Dys 3, thin liquid diet.  Abbreviated nutrition-focused physical exam  completed. Findings are mild to moderate fat depletion, mild to moderate muscle depletion, and no edema.   Diet Order:  DIET DYS 3 Room service appropriate?: Yes; Fluid consistency:: Thin  Skin:   (HIV lesions)  Last BM:  N/A  Height:   Ht Readings from Last 1 Encounters:  11/07/15 5\' 4"  (1.626 m)    Weight:   Wt Readings from Last 1 Encounters:  11/07/15 116 lb 4.8 oz (52.753 kg)    Ideal Body Weight:  54.5 kg  BMI:  Body mass index is 19.95 kg/(m^2).  Estimated Nutritional Needs:   Kcal:  1500-1700  Protein:  75-90 gm  Fluid:  1.5-1.7 L  EDUCATION NEEDS:   No education needs identified at this time  Maureen ChattersKatie Tabrina Esty, RD, LDN Pager #: [phone] After-Hours Pager #: [phone]

## 2015-11-07 NOTE — Progress Notes (Addendum)
CRITICAL VALUE ALERT  Critical value received: Sodium 117  Date of notification: 11/07/15  Time of notification: 1204  Critical value read back: Yes  Nurse who received alert: Nickolas MadridLetitia Rodnisha Blomgren, RN  MD notified (1st page): Dr. Elisabeth Pigeonevine  No new orders given by Dr. Elisabeth Pigeonevine.

## 2015-11-07 NOTE — ED Notes (Signed)
Attempted to call report

## 2015-11-07 NOTE — Progress Notes (Signed)
Dr. Elisabeth Pigeonevine notified of patient DBP in the 90's and increasing heart rate. The highest HR seen in 120's. Orders given keep an eye on vital signs.

## 2015-11-07 NOTE — Progress Notes (Addendum)
Patient ID: Samantha Kline, female   DOB: [birth year], 55 y.o.   MRN: [id number]  Patient admitted after midnight. For details please refer to admission note completed 11/07/2015.   55 y.o. female with past medical history significant for recently diagnosed HIV, recent hospitalization from 10/24/2015 through 10/31/2015 for new diagnosis of HIV pneumonia. Patient was subsequently discharged on Bactrim and prednisone with concerns for pneumocystis pneumonia. Apparently patient was doing well for couple of days after discharge but then started to decline with generalized weakness, loss of appetite, nausea, vomiting and confusion. Patient's family reported no blood with vomiting. No diarrhea. No fevers or chills.  In ED, patient was hemodynamically stable. Blood work was notable for sodium of 110, potassium 5.3, bicarbonate 18, glucose 161. Lactic acid was 2.65 but subsequently normalized. CT head showed no acute intracranial process. Chest x-ray showed no acute abnormalities.   Assessment and plan: Acute metabolic encephalopathy / hyponatremia - Altered mental status likely secondary to profound hyponatremia and sodium level of 110 on the admission - Repeat sodium level 114 - Resume IV fluids - Follow up BMP in am  HIV on HAART - Continue Tivicay and descovy - Continue bactrim and prednisone for PCP prophylaxis - Continue azithromycin for MAC prophylaxis  Hyperkalemia - Due to bactrim - Given kayexalate - Follow up BMP in am   Manson Passeylma Devine [facility] [phone]

## 2015-11-07 NOTE — H&P (Addendum)
History and Physical    Samantha Kline ZOX:[id number] DOB: Aug 07, 1960 DOA: 11/06/2015  Referring MD/NP/PA:   PCP: No PCP Per Patient   Patient coming from:  The patient is coming from home.  At baseline, pt is partially dependent for most of ADL.    Chief Complaint: Altered mental status, generalized weakness, nausea, vomiting  HPI: Samantha Kline is a 55 y.o. female with medical history significant of recently diagnosed HIV, recently treated pneumonia, who presents with altered mental status, generalized weakness, nausea and vomiting.  Patient was recently hospitalized from 7/3-7/10 and had new diagnosis of HIV and pneumonia. She was started with anti-HIV medications. Patient was discharged on bactrim and prednisone with concerns of PCP. Per husband, pt was doing well intially, but 4 days ago she started to decline. Pt developed increased generalized weakness, loss of appetite, decreased oral intake, nauseas, vomiting and confusion. She vomited twice without blood in the vomitus today. Patient does not have abdominal pain or diarrhea. She was initially very confused with AMS, which improved with IV NS bolus, and became oriented x 3. She continues to have dry cough and mild shortness of breath, but no chest pain. No fever or chills. Patient denies symptoms of UTI or unilateral weakness. When I saw pt in ED, she is oriented x 3 and looks very weak, moves all extremities. No active nausea, vomiting, abdominal pain or chest pain.  ED Course: pt was found to have Na 110, WBC 6.2, lactate 2.65, troponin negative, lipase 22, negative urinalysis, temperature normal, transient tachypnea and tachycardia, potassium 5.3 without T-wave peaking, bicarbonate 18, anion gap 10, negative chest x-ray, negative CT-head is negative for acute intracranial abnormalities. Patient is admitted to stepdown for further evaluation and treatment.  Review of Systems:   General: no fevers, chills, no changes in body weight, has  poor appetite, has fatigue HEENT: no blurry vision, hearing changes or sore throat Pulm: has dyspnea, coughing, no wheezing CV: no chest pain, no palpitations Abd: has nausea, vomiting, no dominal pain, diarrhea, constipation GU: no dysuria, burning on urination, increased urinary frequency, hematuria  Ext: no leg edema Neuro: no unilateral weakness, numbness, or tingling, no vision change or hearing loss Skin: Small skin tear in sacral area  MSK: No muscle spasm, no deformity, no limitation of range of movement in spin Heme: No easy bruising.  Travel history: No recent long distant travel.  Allergy: No Known Allergies  Past Medical History  Diagnosis Date  . Anemia   . Vitamin D deficiency   . Pneumonia 10/24/2015  . HIV (human immunodeficiency virus infection) (HCC)     11/06/15 Family states it was just diagnosed this week    Past Surgical History  Procedure Laterality Date  . Tubal ligation  1980s    Social History:  reports that she has never smoked. She has never used smokeless tobacco. She reports that she drinks alcohol. She reports that she does not use illicit drugs.  Family History:  Family History  Problem Relation Age of Onset  . Diabetes Father   . Heart attack Brother   . Hypertension Sister      Prior to Admission medications   Medication Sig Start Date End Date Taking? Authorizing Provider  azithromycin (ZITHROMAX) 600 MG tablet Take 2 tablets (1,200 mg total) by mouth once a week. Patient taking differently: Take 1,200 mg by mouth every Friday.  10/31/15  Yes Jeralyn Bennett, MD  dolutegravir (TIVICAY) 50 MG tablet Take 1 tablet (50 mg total) by  mouth daily. Patient taking differently: Take 50 mg by mouth daily at 12 noon.  10/31/15  Yes Jeralyn Bennett, MD  emtricitabine-tenofovir AF (DESCOVY) 200-25 MG tablet Take 1 tablet by mouth daily. Patient taking differently: Take 1 tablet by mouth daily at 12 noon.  10/31/15  Yes Jeralyn Bennett, MD  ferrous  sulfate 325 (65 FE) MG tablet Take 325 mg by mouth daily with breakfast.   Yes Historical Provider, MD  fluconazole (DIFLUCAN) 100 MG tablet Take 1 tablet (100 mg total) by mouth once a week. Patient taking differently: Take 100 mg by mouth every Friday.  11/04/15  Yes Jeralyn Bennett, MD  predniSONE (DELTASONE) 20 MG tablet Take 40 mg PO q daily for 5 days, followed by 30 mg PO q daily for 11 days. Qty Sufficient Patient taking differently: Take 30-40 mg by mouth daily at 12 noon. Take 2 tablets (40 mg) by mouth daily at noon for 5 days, then take 1 1/2 tablets (30 mg) daily for 11 days, then stop 10/31/15  Yes Jeralyn Bennett, MD  sulfamethoxazole-trimethoprim (BACTRIM DS,SEPTRA DS) 800-160 MG tablet 2 tabs PO TID x 16 days, then 1 tab PO q daily thereafter Qty Sufficient Patient taking differently: Take 1-23 tablets by mouth See admin instructions. Take 2 tablets by mouth 3 times daily for 16 days, then take 1 tablet daily. 10/31/15  Yes Jeralyn Bennett, MD  vitamin B-12 (CYANOCOBALAMIN) 100 MCG tablet Take 100 mcg by mouth daily.   Yes Historical Provider, MD  Vitamin D, Ergocalciferol, (DRISDOL) 50000 units CAPS capsule Take 50,000 Units by mouth every Friday.  10/03/15  Yes Historical Provider, MD    Physical Exam: Filed Vitals:   11/07/15 0130 11/07/15 0300 11/07/15 0330 11/07/15 0400  BP: 137/93 155/99 139/95 138/93  Pulse: 87 95    Temp:      TempSrc:      Resp: SpO2: 99% 98%     General: Not in acute distress. Looks very weak and pale, cachectic. HEENT:       Eyes: PERRL, EOMI, no scleral icterus.       ENT: No discharge from the ears and nose, no pharynx injection, no tonsillar enlargement.        Neck: No JVD, no bruit, no mass felt. Heme: No neck lymph node enlargement. Cardiac: S1/S2, RRR, No murmurs, No gallops or rubs. Pulm: No rales, wheezing, rhonchi or rubs. Abd: Soft, nondistended, nontender, no rebound pain, no organomegaly, BS present. GU: No  hematuria Ext: No pitting leg edema bilaterally. 2+DP/PT pulse bilaterally. Musculoskeletal: No joint deformities, No joint redness or warmth, no limitation of ROM in spin. Skin: Small skin tear in sacral area  Neuro: Alert, oriented X3, cranial nerves II-XII grossly intact, moves all extremities normally.  Psych: Patient is not psychotic, no suicidal or hemocidal ideation.  Labs on Admission: I have personally reviewed following labs and imaging studies  CBC:  Recent Labs Lab 11/06/15 2047  WBC 6.2  HGB 13.2  HCT 36.5  MCV 83.0  PLT 284   Basic Metabolic Panel:  Recent Labs Lab 11/06/15 2047 11/07/15 0256  NA 110* PENDING  K 5.3* 5.6*  CL 82* 86*  CO2 18* 17*  GLUCOSE 161* 114*  BUN 24* 21*  CREATININE 1.00 0.89  CALCIUM 8.9 8.6*   GFR: Estimated Creatinine Clearance: 60.9 mL/min (by C-G formula based on Cr of 0.89). Liver Function Tests:  Recent Labs Lab 11/06/15 2047  AST 36  ALT 27  ALKPHOS 76  BILITOT 0.6  PROT 8.1  ALBUMIN 2.9*    Recent Labs Lab 11/07/15 0256  LIPASE 22   No results for input(s): AMMONIA in the last 168 hours. Coagulation Profile:  Recent Labs Lab 11/07/15 0256  INR 1.74*   Cardiac Enzymes: No results for input(s): CKTOTAL, CKMB, CKMBINDEX, TROPONINI in the last 168 hours. BNP (last 3 results) No results for input(s): PROBNP in the last 8760 hours. HbA1C: No results for input(s): HGBA1C in the last 72 hours. CBG: No results for input(s): GLUCAP in the last 168 hours. Lipid Profile: No results for input(s): CHOL, HDL, LDLCALC, TRIG, CHOLHDL, LDLDIRECT in the last 72 hours. Thyroid Function Tests: No results for input(s): TSH, T4TOTAL, FREET4, T3FREE, THYROIDAB in the last 72 hours. Anemia Panel: No results for input(s): VITAMINB12, FOLATE, FERRITIN, TIBC, IRON, RETICCTPCT in the last 72 hours. Urine analysis:    Component Value Date/Time   COLORURINE YELLOW 11/06/2015 2236   APPEARANCEUR CLEAR 11/06/2015 2236    LABSPEC 1.029 11/06/2015 2236   PHURINE 6.0 11/06/2015 2236   GLUCOSEU NEGATIVE 11/06/2015 2236   HGBUR NEGATIVE 11/06/2015 2236   BILIRUBINUR NEGATIVE 11/06/2015 2236   KETONESUR NEGATIVE 11/06/2015 2236   PROTEINUR 30* 11/06/2015 2236   NITRITE NEGATIVE 11/06/2015 2236   LEUKOCYTESUR NEGATIVE 11/06/2015 2236   Sepsis Labs: @LABRCNTIP (procalcitonin:4,lacticidven:4) ) Recent Results (from the past 240 hour(s))  Pneumocystis smear by DFA     Status: None   Collection Time: 10/28/15  9:09 PM  Result Value Ref Range Status   Specimen Source-PJSRC SPUTUM  Final   Pneumocystis jiroveci Ag NEGATIVE  Final    Comment: Performed at [facility]     Radiological Exams on Admission: Dg Chest 1 View  11/06/2015  CLINICAL DATA:  Altered mental status. EXAM: CHEST 1 VIEW COMPARISON:  September 29, 2015 FINDINGS: The heart size and mediastinal contours are within normal limits. Both lungs are clear. The visualized skeletal structures are unremarkable. IMPRESSION: No acute abnormalities. Electronically Signed   By: Gerome Sam III M.D   On: 11/06/2015 22:20   Ct Head Wo Contrast  11/07/2015  CLINICAL DATA:  Altered mental status.  Nausea and vomiting. EXAM: CT HEAD WITHOUT CONTRAST TECHNIQUE: Contiguous axial images were obtained from the base of the skull through the vertex without intravenous contrast. COMPARISON:  None. FINDINGS: There is mild debris in the sphenoid sinuses. Paranasal sinuses, mastoid air cells, and middle ears are otherwise unremarkable. No acute bony abnormalities. Extracranial soft tissues are within normal limits. No subdural, epidural, or subarachnoid hemorrhage. The ventricles and sulci are unremarkable for age. The cerebellum, brainstem, and basal cisterns are normal. No acute cortical ischemia or infarct. No mass, mass effect, or midline shift. IMPRESSION: No acute intracranial process. Electronically Signed   By: Gerome Sam III M.D   On: 11/07/2015 01:43      EKG: Independently reviewed. Sinus rhythm, tachycardia, QTC 418, anteroseptal infarction pattern.  Assessment/Plan Principal Problem:   Hyponatremia Active Problems:   Human immunodeficiency virus (HIV) disease (HCC)   CAP (community acquired pneumonia)   Malnutrition of moderate degree   Acute encephalopathy   Hyperkalemia   Nausea & vomiting   Pressure ulcer   Elevated lactic acid level   Hyponatremia: Na 110. Initially pt had AMS, planned to put central line for hypertonic NaCl infusion, but her mental status improved with IV normal saline bolus. She becomes oriented 3. No signs of seizure. EDP discussed with PCCM, recommended admission to  stepdown. ED physician also discussed with nephrology, Dr. Darrick Pennaeterding, who recommended IV fluid. 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 GI loss due to vomiting and decreased oral intake but also contributed.  -will admit to SDU -IVF: 1l NS, then 75 cc/h (avoid overcorrection) -check Osmo of plasma and urine, urine Na, and TSH -BMP q6h -Frequent neuro checks  Human immunodeficiency virus (HIV) disease (HCC): newly diagnosed, CD4=20. -continue  -Prophylaxis: Azithromycin and Diflucan -Continue Descovy -f/u with ID  CAP: recently treated PNA and possible PCP. CXR is clear. Patient has mild cough and shortness of breath. No chest pain no fever. -continue bactrim and prednisone -per previous discharge summary on 10/31/15, pt was discharged on Bactrim DS 2 tabs PO TID for 16 days to complete a total of 3 weeks and Prednisone taper for a duration of 3 weeks as well, as recommended by Dr Orvan Falconerampbell of ID  Malnutrition of moderate degree: -Consult to nutrition   Acute encephalopathy: likely due to hyponatremia, improved significantly with IV normal saline. Currently oriented 3. -Treat underlying issues, particularly hyponatremia -Frequent neuro check  Hypernatremia: Potassium 5.3. No EKG  changes. -Kayexalate 30 mg 1 -Follow-up but BNP  Nausea & vomiting: unclear etiology. Patient does not have diarrhea or abdominal pain. Lipase normal. Likely due to underlying problems, including pneumonia, HIV -IV fluid as above -When necessary Zofran for nausea.  Pressure ulcer: stage I small sacral ulcer -Consul to wound care  Elevated lactic acid level: Lipase 2.65. Patient does not have leukocytosis, no fever, clinically does not seem to have sepsis. Likely due to dehydration. -IV fluid as above -tread Lactic acid    DVT ppx: SQ Lovenox Code Status: Full code Family Communication: Yes, patient's husband at bed side Disposition Plan:  Anticipate discharge back to previous home environment Consults called: EDP  discussed with PCCM and renal Admission status: SDU/inpation       Date of Service 11/07/2015    Lorretta HarpIU, Yazlynn Birkeland [facility] Pager [phone]  If 7PM-7AM, please contact night-coverage [email or web address] Password [email or web address] 11/07/2015, 4:51 AM

## 2015-11-07 NOTE — Evaluation (Signed)
Physical Therapy Evaluation Patient Details Name: Samantha Kline MRN: [id number] DOB: 02-25-1961 Today's Date: 11/07/2015   History of Present Illness  55 y.o. female with medical history significant of recently diagnosed HIV, recently treated pneumonia, who presents with altered mental status, generalized weakness, nausea and vomiting.Head CT and CXR negative. 50 lb unintentional weight loss over the past 5 - 6 months along with decreased appetite.     Clinical Impression  Pt admitted with above diagnosis. Remains confused with very slow processing and very weak. Significant decline in mobility since recent discharge home (per husband, has gradually declined at home--he obtained a BSC because she could not walk to bathroom; he carried pt down steps and to car to bring to hospital). Pt currently with functional limitations due to the deficits listed below (see PT Problem List).  Pt will benefit from skilled PT to increase their independence and safety with mobility to allow discharge to the venue listed below.       Follow Up Recommendations CIR;Supervision/Assistance - 24 hour (however husband "will think about it" wants pt to go home )    Equipment Recommendations  Other (comment) (TBA if goes home)    Recommendations for Other Services OT consult     Precautions / Restrictions Precautions Precautions: Fall      Mobility  Bed Mobility Overal bed mobility: Needs Assistance Bed Mobility: Rolling;Sidelying to Sit;Sit to Sidelying Rolling: Mod assist Sidelying to sit: Mod assist;HOB elevated     Sit to sidelying: Mod assist General bed mobility comments: slow processing, incr time  Transfers                 General transfer comment: unable due to fatigue  Ambulation/Gait                Stairs            Wheelchair Mobility    Modified Rankin (Stroke Patients Only)       Balance Overall balance assessment: Needs assistance Sitting-balance support:  Bilateral upper extremity supported;Feet supported Sitting balance-Leahy Scale: Poor Sitting balance - Comments: drifts posterior or right                                     Pertinent Vitals/Pain Pain Assessment: No/denies pain    Home Living Family/patient expects to be discharged to:: Private residence Living Arrangements: Spouse/significant other Available Help at Discharge: Family;Available 24 hours/day Type of Home: House Home Access: Stairs to enter Entrance Stairs-Rails: None Entrance Stairs-Number of Steps: 2 Home Layout: Multi-level Home Equipment: Bedside commode;Walker - 2 wheels      Prior Function Level of Independence: Needs assistance   Gait / Transfers Assistance Needed: walked 100 ft minguard with RW on recent admission; at home walked with minguard assist, no device, no furniture walking           Hand Dominance   Dominant Hand: Right    Extremity/Trunk Assessment   Upper Extremity Assessment: Defer to OT evaluation;Generalized weakness           Lower Extremity Assessment: RLE deficits/detail;LLE deficits/detail RLE Deficits / Details: AROM WFL; knee extension 3+ LLE Deficits / Details: AROM WFL; knee extension 3+  Cervical / Trunk Assessment: Other exceptions  Communication   Communication: Other (comment) (very soft spoken, at times unintelligible)  Cognition Arousal/Alertness: Lethargic Behavior During Therapy: Flat affect Overall Cognitive Status: Impaired/Different from baseline Area of Impairment: Orientation;Attention;Following commands;Problem  solving Orientation Level: Time;Situation Current Attention Level: Sustained   Following Commands: Follows one step commands inconsistently;Follows one step commands with increased time Safety/Judgement: Decreased awareness of safety;Decreased awareness of deficits   Problem Solving: Slow processing;Decreased initiation;Difficulty sequencing;Requires verbal cues;Requires  tactile cues      General Comments      Exercises Other Exercises Other Exercises: AROM x 4 extremities      Assessment/Plan    PT Assessment Patient needs continued PT services  PT Diagnosis Generalized weakness;Difficulty walking;Altered mental status   PT Problem List Decreased strength;Decreased activity tolerance;Decreased balance;Decreased mobility;Decreased cognition;Decreased knowledge of use of DME;Decreased safety awareness  PT Treatment Interventions DME instruction;Gait training;Stair training;Functional mobility training;Therapeutic activities;Therapeutic exercise;Balance training;Patient/family education;Cognitive remediation   PT Goals (Current goals can be found in the Care Plan section) Acute Rehab PT Goals Patient Stated Goal: be able to go home and get upstairs to bedroom PT Goal Formulation: With patient/family Time For Goal Achievement: 11/21/15 Potential to Achieve Goals: Fair    Frequency Min 3X/week   Barriers to discharge Inaccessible home environment enters into bottom level of home with 1/2 bath and playroom; up 10 steps to get to level with bedroom, bathroom    Co-evaluation               End of Session   Activity Tolerance: Patient limited by fatigue Patient left: in bed;with call bell/phone within reach;with family/visitor present Nurse Communication: Mobility status         Time: [id number] PT Time Calculation (min) (ACUTE ONLY): 36 min   Charges:   PT Evaluation $PT Eval Moderate Complexity: 1 Procedure PT Treatments $Therapeutic Activity: 8-22 mins   PT G Codes:        Sentoria Brent 11/07/2015, 5:38 PM 11/07/2015  Pager [phone]

## 2015-11-07 NOTE — Consult Note (Signed)
WOC Nurse wound consult note Reason for Consult: Consult requested for buttocks/sacrum.  Pt has HIV, according to EMR. Wound type: several patchy areas of partial thickness in peri-rectal area; appearance consistent with HIV  lesions Pressure Ulcer POA: There are NOT pressure injuries, they were present on admission Measurement: Affected area approx 4X2X.1cm Wound bed: pink and moist Drainage (amount, consistency, odor) no odor or drainage Dressing procedure/placement/frequency: Pt using the bedpan frequently and dressing would become soiled.  Leave open to air and apply barrier cream to protect and repel moisture.  Please re-consult if further assistance is needed.  Thank-you,  Cammie Mcgeeawn Samantha Hunnicutt MSN, RN, CWOCN, [place], CNS [phone]

## 2015-11-08 LAB — BASIC METABOLIC PANEL
ANION GAP: 7 (ref 5–15)
Anion gap: 7 (ref 5–15)
BUN: 16 mg/dL (ref 6–20)
BUN: 16 mg/dL (ref 6–20)
CALCIUM: 8.4 mg/dL — AB (ref 8.9–10.3)
CHLORIDE: 90 mmol/L — AB (ref 101–111)
CO2: 22 mmol/L (ref 22–32)
CO2: 20 mmol/L — ABNORMAL LOW (ref 22–32)
CREATININE: 0.81 mg/dL (ref 0.44–1.00)
Calcium: 8.3 mg/dL — ABNORMAL LOW (ref 8.9–10.3)
Chloride: 91 mmol/L — ABNORMAL LOW (ref 101–111)
Creatinine, Ser: 0.84 mg/dL (ref 0.44–1.00)
GFR calc Af Amer: >60 mL/min (ref >60)
GFR calc non Af Amer: >60 mL/min (ref >60)
Glucose, Bld: 134 mg/dL — ABNORMAL HIGH (ref 65–99)
Glucose, Bld: 100 mg/dL — ABNORMAL HIGH (ref 65–99)
POTASSIUM: 4.8 mmol/L (ref 3.5–5.1)
Potassium: 5.1 mmol/L (ref 3.5–5.1)
SODIUM: 119 mmol/L — AB (ref 135–145)
SODIUM: 118 mmol/L — AB (ref 135–145)

## 2015-11-08 LAB — URINE CULTURE: CULTURE: NO GROWTH

## 2015-11-08 LAB — CBC
HCT: 31.7 % — ABNORMAL LOW (ref 36.0–46.0)
HEMOGLOBIN: 11.2 g/dL — AB (ref 12.0–15.0)
MCH: 30.1 pg (ref 26.0–34.0)
MCHC: 35.3 g/dL (ref 30.0–36.0)
MCV: 85.2 fL (ref 78.0–100.0)
PLATELETS: 209 10*3/uL (ref 150–400)
RBC: 3.72 MIL/uL — AB (ref 3.87–5.11)
RDW: 15.7 % — ABNORMAL HIGH (ref 11.5–15.5)
WBC: 4.6 10*3/uL (ref 4.0–10.5)

## 2015-11-08 LAB — OSMOLALITY: Osmolality: 251 mOsm/kg — ABNORMAL LOW (ref 275–295)

## 2015-11-08 MED ORDER — PRIMAQUINE PHOSPHATE 26.3 MG PO TABS
30.0000 mg | ORAL_TABLET | Freq: Every day | ORAL | Status: AC
  Administered 2015-11-08 – 2015-11-17 (×10): 30 mg via ORAL
  Filled 2015-11-08 (×10): qty 2

## 2015-11-08 MED ORDER — DAPSONE 100 MG PO TABS
100.0000 mg | ORAL_TABLET | Freq: Every day | ORAL | Status: DC
  Filled 2015-11-08: qty 1

## 2015-11-08 MED ORDER — DEXTROSE-NACL 5-0.9 % IV SOLN
INTRAVENOUS | Status: DC
  Administered 2015-11-08 – 2015-11-11 (×6): via INTRAVENOUS
  Administered 2015-11-12: 1000 mL via INTRAVENOUS
  Administered 2015-11-12 – 2015-11-18 (×10): via INTRAVENOUS
  Administered 2015-11-19: 75 mL/h via INTRAVENOUS
  Administered 2015-11-20 – 2015-11-24 (×6): via INTRAVENOUS

## 2015-11-08 MED ORDER — CLINDAMYCIN HCL 300 MG PO CAPS
600.0000 mg | ORAL_CAPSULE | Freq: Three times a day (TID) | ORAL | Status: DC
  Administered 2015-11-08 – 2015-11-17 (×27): 600 mg via ORAL
  Filled 2015-11-08 (×29): qty 2

## 2015-11-08 MED ORDER — DAPSONE 100 MG PO TABS
100.0000 mg | ORAL_TABLET | Freq: Every day | ORAL | Status: DC
  Administered 2015-11-08: 100 mg via ORAL

## 2015-11-08 MED ORDER — DEXTROSE-NACL 5-0.9 % IV SOLN
INTRAVENOUS | Status: DC
  Filled 2015-11-08 (×2): qty 1000

## 2015-11-08 NOTE — Clinical Social Work Note (Signed)
Clinical Social Work Assessment  Patient Details  Name: Samantha Kline MRN: [id number] Date of Birth: [birth year]  Date of referral:  11/08/15               Reason for consult:  Facility Placement                Permission sought to share information with:  Facility Sport and exercise psychologist, Family Supports Permission granted to share information::  No (Patient was asleep; completed assessment with patient's husband at bedside.)  Name::     Mitzi Hansen  Agency::  SNFs  Relationship::  Spouse  Contact Information:  [phone]  Housing/Transportation Living arrangements for the past 2 months:  Single Family Home Source of Information:  Spouse Patient Interpreter Needed:  None Criminal Activity/Legal Involvement Pertinent to Current Situation/Hospitalization:  No - Comment as needed Significant Relationships:  Spouse Lives with:  Spouse Do you feel safe going back to the place where you live?  Yes Need for family participation in patient care:  Yes (Comment)  Care giving concerns:  CSW received referral for possible SNF placement at time of discharge if CIR is unable to admit. CSW met with patient's spouse at bedside regarding PT recommendation of SNF placement at time of discharge. Per patient's spouse, patient's has previously been opposed to SNFs but he will discuss it with her. CSW to continue to follow and assist with discharge planning needs.   Social Worker assessment / plan:  CSW spoke with patient's spouse concerning possibility of rehab at [facility] before returning home.  Employment status:  Other (Comment) Insurance information:  Managed Care PT Recommendations:  Inpatient Rehab Consult Information / Referral to community resources:  [place]  Patient/Family's Response to care:  Patient's spouse recognizes need for rehab before returning home and may be agreeable to a SNF versus home with home health PT.  Patient/Family's Understanding of and Emotional Response to  Diagnosis, Current Treatment, and Prognosis:  Patient is realistic regarding therapy needs. No questions/concerns about plan or treatment.    Emotional Assessment Appearance:  Appears older than stated age Attitude/Demeanor/Rapport:  Unable to Assess Affect (typically observed):  Unable to Assess Orientation:  Oriented to Situation, Oriented to  Time, Oriented to Place, Oriented to Self Alcohol / Substance use:  Not Applicable Psych involvement (Current and /or in the community):  No (Comment)  Discharge Needs  Concerns to be addressed:  Care Coordination Readmission within the last 30 days:  No Current discharge risk:  None Barriers to Discharge:  Continued Medical Work up   [employer], [place] 11/08/2015, 4:46 PM

## 2015-11-08 NOTE — Progress Notes (Signed)
Patient ID: Samantha FewHythia Plant, female   DOB: [birth year], 55 y.o.   MRN: [id number]  PROGRESS NOTE    Samantha Kline  [id number] DOB: [birth year] DOA: 11/06/2015  PCP: No PCP Per Patient   Brief Narrative:  55 y.o. female with past medical history significant for recently diagnosed HIV, recent hospitalization from 10/24/2015 through 10/31/2015 for new diagnosis of HIV pneumonia. Patient was subsequently discharged on Bactrim and prednisone with concerns for pneumocystis pneumonia. Apparently patient was doing well for couple of days after discharge but then started to decline with generalized weakness, loss of appetite, nausea, vomiting and confusion. Patient's family reported no blood with vomiting. No diarrhea. No fevers or chills.  In ED, patient was hemodynamically stable. Blood work was notable for sodium of 110, potassium 5.3, bicarbonate 18, glucose 161. Lactic acid was 2.65 but subsequently normalized. CT head showed no acute intracranial process. Chest x-ray showed no acute abnormalities.   Assessment & Plan:  Acute metabolic encephalopathy / hyponatremia - Altered mental status likely secondary to profound hyponatremia and sodium level of 110 on the admission - No acute findings on CT head - Repeat sodium level 119, 118 - Hyponatremia also likely because of Bactrim. I spoke with infectious disease who recommended changing it to dapsone and checking G6PD as well - We will obtain urine sodium urine osmolarity and serum osmolarity to determine the etiology of hyponatremia - Changed IV fluids to free water - TSH is within normal limits - Follow up BMP in am  HIV on HAART - Continue Tivicay and descovy - Stop Bactrim because of hyponatremia, continue prednisone - for PCP prophylaxis - Continue azithromycin for MAC prophylaxis  Hyperkalemia - Secondary to Bactrim - She was given Kayexalate for that - Repeat potassium within normal limits    DVT prophylaxis: Lovenox  subcutaneous Code Status: full code  Family Communication: No family at the bedside Disposition Plan: Remains in step down unit because of hyponatremia   Consultants:   ID - phone call only for change for Bactrim to dapsone  Procedures:   None   Antimicrobials:   HAART  Bactrim, stop today  Dapsone 11/08/2015 -->  Fluconazole  Azithromycin    Subjective: No overnight events.  Objective: Filed Vitals:   11/07/15 2021 11/07/15 2337 11/08/15 0358 11/08/15 0727  BP: 134/80 132/92 135/91 134/86  Pulse: 104 102 99 95  Temp: 98 F (36.7 C) 98 F (36.7 C) 98.1 F (36.7 C) 98.4 F (36.9 C)  TempSrc: Oral Oral Oral Oral  Resp: 23  21 21   Height:      Weight:      SpO2: 100% 100% 99% 99%    Intake/Output Summary (Last 24 hours) at 11/08/15 1159 Last data filed at 11/08/15 0936  Gross per 24 hour  Intake   1890 ml  Output      0 ml  Net   1890 ml   Filed Weights   11/07/15 0900  Weight: 52.753 kg (116 lb 4.8 oz)    Examination:  General exam: Appears calm and comfortable  Respiratory system: Clear to auscultation. Respiratory effort normal. Cardiovascular system: S1 & S2 heard, RRR. No JVD, murmurs, rubs, gallops or clicks. No pedal edema. Gastrointestinal system: Abdomen is nondistended, soft and nontender. No organomegaly or masses felt. Normal bowel sounds heard. Central nervous system: Alert and oriented. No focal neurological deficits. Extremities: Symmetric 5 x 5 power. Skin: No rashes, lesions or ulcers Psychiatry: Judgement and insight appear normal. Mood & affect appropriate.  Data Reviewed: I have personally reviewed following labs and imaging studies  CBC:  Recent Labs Lab 11/06/15 2047 11/07/15 1052 11/08/15 0357  WBC 6.2 5.0 4.6  HGB 13.2 12.7 11.2*  HCT 36.5 35.5* 31.7*  MCV 83.0 84.3 85.2  PLT 284 247 209   Basic Metabolic Panel:  Recent Labs Lab 11/07/15 0256 11/07/15 1052 11/07/15 1635 11/07/15 2239 11/08/15 0357  NA  114* 117* 116* 119* 118*  K 5.6* 5.0 4.9 5.1 4.8  CL 86* 88* 89* 90* 91*  CO2 17* 20* 19* 22 20*  GLUCOSE 114* 98 131* 134* 100*  BUN 21* 16 17 16 16   CREATININE 0.89 0.86 0.80 0.81 0.84  CALCIUM 8.6* 8.7* 8.3* 8.4* 8.3*   GFR: Estimated Creatinine Clearance: 62.3 mL/min (by C-G formula based on Cr of 0.84). Liver Function Tests:  Recent Labs Lab 11/06/15 2047  AST 36  ALT 27  ALKPHOS 76  BILITOT 0.6  PROT 8.1  ALBUMIN 2.9*    Recent Labs Lab 11/07/15 0256  LIPASE 22   No results for input(s): AMMONIA in the last 168 hours. Coagulation Profile:  Recent Labs Lab 11/07/15 0256  INR 1.74*   Cardiac Enzymes: No results for input(s): CKTOTAL, CKMB, CKMBINDEX, TROPONINI in the last 168 hours. BNP (last 3 results) No results for input(s): PROBNP in the last 8760 hours. HbA1C: No results for input(s): HGBA1C in the last 72 hours. CBG: No results for input(s): GLUCAP in the last 168 hours. Lipid Profile: No results for input(s): CHOL, HDL, LDLCALC, TRIG, CHOLHDL, LDLDIRECT in the last 72 hours. Thyroid Function Tests:  Recent Labs  11/07/15 0438  TSH 1.260   Anemia Panel: No results for input(s): VITAMINB12, FOLATE, FERRITIN, TIBC, IRON, RETICCTPCT in the last 72 hours. Urine analysis:    Component Value Date/Time   COLORURINE YELLOW 11/06/2015 2236   APPEARANCEUR CLEAR 11/06/2015 2236   LABSPEC 1.029 11/06/2015 2236   PHURINE 6.0 11/06/2015 2236   GLUCOSEU NEGATIVE 11/06/2015 2236   HGBUR NEGATIVE 11/06/2015 2236   BILIRUBINUR NEGATIVE 11/06/2015 2236   KETONESUR NEGATIVE 11/06/2015 2236   PROTEINUR 30* 11/06/2015 2236   NITRITE NEGATIVE 11/06/2015 2236   LEUKOCYTESUR NEGATIVE 11/06/2015 2236   Sepsis Labs: @LABRCNTIP (procalcitonin:4,lacticidven:4)   Recent Results (from the past 240 hour(s))  Culture, blood (Routine x 2)     Status: None (Preliminary result)   Collection Time: 11/06/15  8:30 PM  Result Value Ref Range Status   Specimen  Description BLOOD LEFT ARM  Final   Special Requests IN PEDIATRIC BOTTLE 2CC  Final   Culture NO GROWTH < 24 HOURS  Final   Report Status PENDING  Incomplete  Culture, blood (Routine x 2)     Status: None (Preliminary result)   Collection Time: 11/06/15  8:45 PM  Result Value Ref Range Status   Specimen Description BLOOD LEFT ARM  Final   Special Requests BOTTLES DRAWN AEROBIC AND ANAEROBIC 4CC   Final   Culture NO GROWTH < 24 HOURS  Final   Report Status PENDING  Incomplete  Urine culture     Status: None   Collection Time: 11/06/15 10:36 PM  Result Value Ref Range Status   Specimen Description URINE, CATHETERIZED  Final   Special Requests NONE  Final   Culture NO GROWTH  Final   Report Status 11/08/2015 FINAL  Final      Radiology Studies: Dg Chest 1 View 11/06/2015  No acute abnormalities.   Ct Head Wo Contrast 11/07/2015  No acute  intracranial process.     Scheduled Meds: .  azithromycin  1,200 mg Oral Q Fri  . dolutegravir  50 mg Oral Q1200  . emtricitabine-tenofovir   1 tablet Oral Q1200  . enoxaparin (LOVENOX)   40 mg Subcutaneous Q24H  . feeding supplement   237 mL Oral TID BM  . ferrous sulfate  325 mg Oral Q breakfast  .  fluconazole  100 mg Oral Q Fri  . predniSONE  30 mg Oral Q1200  . sodium polystyrene  30 g Oral Once  . vitamin B-12  100 mcg Oral Daily  .  Vitamin   50,000 Units Oral Q Fri   Continuous Infusions: . dextrose 5 % and 0.9% NaCl 1,000 mL infusion       LOS: 1 day    Time spent: 25 minutes  Greater than mental status sodium 50% of the time spent on counseling and coordinating the care.   Manson Passey, MD [facility] Pager [phone]  If 7PM-7AM, please contact night-coverage [email or web address] Password [email or web address] 11/08/2015, 11:59 AM

## 2015-11-08 NOTE — Progress Notes (Signed)
Speech Language Pathology Treatment: Dysphagia  Patient Details Name: Lorna FewHythia Clardy MRN: [id number] DOB: December 16, 1960 Today's Date: 11/08/2015 Time: [id number] SLP Time Calculation (min) (ACUTE ONLY): 17 min  Assessment / Plan / Recommendation Clinical Impression  Patient seen for f/u diet tolerance assessment. Skilled observation with prescribed diet complete. Again, patient began session with poor sustained attention, little-no communication, severely dysarthric but note that strength of phonation, oral transit time, and initiation of swallow all improved over time coinciding with an improvement in general sustained attention to task and conversation. Patient then able to consume soft solids, thin liquids with only mild oral transit delays, no overt s/s of aspiration, and use of swallowing precautions (alternation of bite/sip given complaints of globus and mild oral/throat irritation due to ? Thrush) via self feeding with moderate verbal cueing. Overall, patient appears to be tolerating diet. Contacted MD via text page for possible treatment for thrush. Will continue to f/u to maximize po intake via use of diet modifications and compensatory strategies.    HPI HPI: Charolett BumpersHythia Manson PasseyBrown is a 55 y.o. female with medical history significant of recently diagnosed HIV, recently treated pneumonia, who presents with altered mental status, generalized weakness, nausea and vomiting.Head CT and CXR negative. 50 lb unintentional weight loss over the past 5 - 6 months along with decreased appetite.      SLP Plan  Continue with current plan of care     Recommendations  Diet recommendations: Dysphagia 3 (mechanical soft);Thin liquid Liquids provided via: Cup;Straw Medication Administration: Whole meds with liquid Supervision: Patient able to self feed;Intermittent supervision to cue for compensatory strategies Compensations: Slow rate;Minimize environmental distractions;Small sips/bites;Follow solids with liquid  (liquid wash every 2-3 bites) Postural Changes and/or Swallow Maneuvers: Seated upright 90 degrees;Upright 30-60 min after meal             Oral Care Recommendations: Oral care BID Follow up Recommendations: None Plan: Continue with current plan of care     GO              [facility] Addasyn Mcbreen MA, CCC-SLP [phone]   Leda [place]feuille Meryl 11/08/2015, 12:24 PM

## 2015-11-08 NOTE — NC FL2 (Signed)
[place] MEDICAID FL2 LEVEL OF CARE SCREENING TOOL     IDENTIFICATION  Patient Name: Samantha Kline Birthdate: 02/04/1961 Sex: female Admission Date (Current Location): 11/06/2015  [facility] [place]   Fac[facility] and Address:  [facility], [address], [place], [place] [address]      Provider Number: [id number]  Attending Physician Name and Address:  Alison MurrayAlma M Devine, MD  Relative Name and Phone Number:  Greig Castillandrew, spouse, [phone]    Current Level of Care: Hospital Recommended Level of Care: Skilled Nursing Facility Prior Approval Number:    Date Approved/Denied:   PASRR Number:    Discharge Plan: SNF    Current Diagnoses: Patient Active Problem List   Diagnosis Date Noted  . Hyperkalemia 11/07/2015  . Nausea & vomiting 11/07/2015  . Pressure ulcer 11/07/2015  . Elevated lactic acid level 11/07/2015  . Sepsis (HCC)   . Acute encephalopathy 11/06/2015  . Hyponatremia 11/06/2015  . Bilateral pneumonia   . Malnutrition of moderate degree 10/28/2015  . CAP (community acquired pneumonia) 10/27/2015  . Human immunodeficiency virus (HIV) disease (HCC)   . Acute hypoxemic respiratory failure (HCC) 10/25/2015  . Anemia 10/24/2015  . Weight loss     Orientation RESPIRATION BLADDER Height & Weight     Self, Time, Situation, Place  Normal Continent Weight: 52.753 kg (116 lb 4.8 oz) Height:  5\' 4"  (162.6 cm)  BEHAVIORAL SYMPTOMS/MOOD NEUROLOGICAL BOWEL NUTRITION STATUS      Continent Diet (Please see DC summary)  AMBULATORY STATUS COMMUNICATION OF NEEDS Skin   Limited Assist Verbally Other (Comment) (wound on peri-rectal area)                       Personal Care Assistance Level of Assistance  Bathing, Feeding, Dressing Bathing Assistance: Limited assistance Feeding assistance: Independent Dressing Assistance: Limited assistance     Functional Limitations Info             SPECIAL CARE FACTORS FREQUENCY  PT  (By licensed PT)     PT Frequency: 5x/week              Contractures      Additional Factors Info  Code Status, Allergies Code Status Info: Full Allergies Info: NKA           Current Medications (11/08/2015):  This is the current hospital active medication list Current Facility-Administered Medications  Medication Dose Route Frequency Provider Last Rate Last Dose  . acetaminophen (TYLENOL) tablet 650 mg  650 mg Oral Q6H PRN Lorretta HarpXilin Niu, MD       Or  . acetaminophen (TYLENOL) suppository 650 mg  650 mg Rectal Q6H PRN Lorretta HarpXilin Niu, MD      . albuterol (PROVENTIL) (2.5 MG/3ML) 0.083% nebulizer solution 2.5 mg  2.5 mg Nebulization Q6H PRN Lorretta HarpXilin Niu, MD      . Melene Muller[START ON 11/11/2015] azithromycin (ZITHROMAX) tablet 1,200 mg  1,200 mg Oral Q Fri Lorretta HarpXilin Niu, MD      . clindamycin (CLEOCIN) capsule 600 mg  600 mg Oral Q8H Alison MurrayAlma M Devine, MD   600 mg at 11/08/15 1500   And  . primaquine tablet 30 mg  30 mg Oral Daily Alison MurrayAlma M Devine, MD   30 mg at 11/08/15 1456  . [START ON 11/18/2015] dapsone tablet 100 mg  100 mg Oral Daily Ginnie SmartJeffrey C Hatcher, MD   100 mg at 11/08/15 1457  . dextromethorphan-guaiFENesin (MUCINEX DM) 30-600 MG per 12 hr  tablet 1 tablet  1 tablet Oral BID PRN Lorretta Harp, MD      . dextrose 5 %-0.9 % sodium chloride infusion   Intravenous Continuous Alison Murray, MD 75 mL/hr at 11/08/15 1600    . dolutegravir (TIVICAY) tablet 50 mg  50 mg Oral Q1200 Lorretta Harp, MD   50 mg at 11/08/15 1455  . emtricitabine-tenofovir AF (DESCOVY) 200-25 MG per tablet 1 tablet  1 tablet Oral Q1200 Lorretta Harp, MD   1 tablet at 11/08/15 1457  . enoxaparin (LOVENOX) injection 40 mg  40 mg Subcutaneous Q24H Lorretta Harp, MD   40 mg at 11/08/15 1520  . feeding supplement (ENSURE ENLIVE) (ENSURE ENLIVE) liquid 237 mL  237 mL Oral TID BM Alison Murray, MD   237 mL at 11/08/15 1458  . ferrous sulfate tablet 325 mg  325 mg Oral Q breakfast Lorretta Harp, MD   325 mg at 11/08/15 1119  . [START ON 11/11/2015] fluconazole  (DIFLUCAN) tablet 100 mg  100 mg Oral Q Fri Lorretta Harp, MD      . ondansetron [facility]) injection 4 mg  4 mg Intravenous Q8H PRN Lorretta Harp, MD      . predniSONE (DELTASONE) tablet 30 mg  30 mg Oral Q1200 Alison Murray, MD   30 mg at 11/08/15 1455  . sodium chloride flush (NS) 0.9 % injection 3 mL  3 mL Intravenous Q12H Lorretta Harp, MD   3 mL at 11/08/15 1119  . sodium polystyrene (KAYEXALATE) 15 GM/60ML suspension 30 g  30 g Oral Once Lorretta Harp, MD   30 g at 11/07/15 0543  . vitamin B-12 (CYANOCOBALAMIN) tablet 100 mcg  100 mcg Oral Daily Lorretta Harp, MD   100 mcg at 11/08/15 1118  . [START ON 11/11/2015] Vitamin D (Ergocalciferol) (DRISDOL) capsule 50,000 Units  50,000 Units Oral Q Fri Lorretta Harp, MD         Discharge Medications: Please see discharge summary for a list of discharge medications.  Relevant Imaging Results:  Relevant Lab Results:   Additional Information    Mearl Latin, LCSWA

## 2015-11-08 NOTE — Progress Notes (Signed)
CRITICAL VALUE ALERT  Critical value received:  Na 119  Date of notification:  11/07/2015  Time of notification:  2321  Critical value read back:Yes  Nurse who received alert:  Rosamaria LintsKelsey Avalene Sealy, RN  Pt has been hyponatremic, 119 is an improvement. MD aware.

## 2015-11-08 NOTE — Evaluation (Signed)
Occupational Therapy Evaluation Patient Details Name: Samantha FewHythia Kline MRN: [id number] DOB: 11-Jan-1961 Today's Date: 11/08/2015    History of Present Illness 55 y.o. female with medical history significant of recently diagnosed HIV, recently treated pneumonia, who presents with altered mental status, generalized weakness, nausea and vomiting.Head CT and CXR negative. 50 lb unintentional weight loss over the past 5 - 6 months along with decreased appetite.    Clinical Impression   Pt admitted with above. She demonstrates the below listed deficits and will benefit from continued OT to maximize safety and independence with BADLs.  Pt presents to OT with generalized weakness, impaired cognition, impaired balance, decreased activity tolerance.  She currently requires max A - total A for ADLs.  She experienced a significant decline in function since recent admission in early July.  Feel she would benefit from CIR to allow her to maximize her safety and independence with ADLs and reduce burden of care on spouse.  Will follow acutely.       Follow Up Recommendations  CIR;Supervision/Assistance - 24 hour    Equipment Recommendations  Wheelchair (measurements OT);Hospital bed    Recommendations for Other Services       Precautions / Restrictions Precautions Precautions: Fall      Mobility Bed Mobility Overal bed mobility: Needs Assistance Bed Mobility: Rolling;Sidelying to Sit;Sit to Sidelying Rolling: Mod assist Sidelying to sit: Max assist     Sit to sidelying: Max assist General bed mobility comments: Pt requires assist for all aspects of movement   Transfers                 General transfer comment: Pt sat EOB x 4 mins, then oved back to supine, would not attempt standing     Balance Overall balance assessment: Needs assistance Sitting-balance support: Feet supported Sitting balance-Leahy Scale: Poor Sitting balance - Comments: Pt with Lt sided lean.  Requires min  progressing to mod A to maintain sitting balance EOB  Postural control: Left lateral lean                                  ADL Overall ADL's : Needs assistance/impaired Eating/Feeding: Moderate assistance;Sitting   Grooming: Wash/dry hands;Wash/dry face;Minimal assistance;Bed level   Upper Body Bathing: Total assistance;Bed level   Lower Body Bathing: Total assistance;Bed level   Upper Body Dressing : Total assistance;Bed level   Lower Body Dressing: Total assistance;Bed level   Toilet Transfer: Total assistance Toilet Transfer Details (indicate cue type and reason): unable to safely attempt  Toileting- Clothing Manipulation and Hygiene: Total assistance;Bed level Toileting - Clothing Manipulation Details (indicate cue type and reason): Pt incontinent of urine when moving toward EOB.  Assisted with peri care while supine      Functional mobility during ADLs: Maximal assistance General ADL Comments: Pt with focused to brief periods of sustained attention      Vision     Perception     Praxis      Pertinent Vitals/Pain Pain Assessment: No/denies pain     Hand Dominance Right   Extremity/Trunk Assessment Upper Extremity Assessment Upper Extremity Assessment: Generalized weakness   Lower Extremity Assessment Lower Extremity Assessment: Defer to PT evaluation   Cervical / Trunk Assessment Cervical / Trunk Assessment: Other exceptions Cervical / Trunk Exceptions: Flexed posture, decreased trunk control    Communication Communication Communication: Other (comment) (soft spoken, difficult to understand )   Cognition Arousal/Alertness: Lethargic Behavior During Therapy:  Flat affect Overall Cognitive Status: Impaired/Different from baseline Area of Impairment: Orientation;Attention;Memory;Following commands;Safety/judgement;Problem solving Orientation Level: Disoriented to;Time;Situation Current Attention Level: Focused;Sustained   Following Commands:  Follows one step commands consistently;Follows one step commands with increased time Safety/Judgement: Decreased awareness of safety   Problem Solving: Slow processing;Decreased initiation;Difficulty sequencing;Requires verbal cues;Requires tactile cues General Comments: Pt very slow to initiate activity.  Requires step by step cues to sequence task    General Comments       Exercises       Shoulder Instructions      Home Living Family/patient expects to be discharged to:: Private residence Living Arrangements: Spouse/significant other Available Help at Discharge: Family;Available 24 hours/day Type of Home: House Home Access: Stairs to enter [employer] of Steps: 2 Entrance Stairs-Rails: None Home Layout: Multi-level Alternate Level Stairs-Number of Steps: 5 up and 5 down Alternate Level Stairs-Rails: Left Bathroom Shower/Tub: Chief Strategy Officer: Standard     Home Equipment: Bedside commode;Walker - 2 wheels          Prior Functioning/Environment Level of Independence: Needs assistance  Gait / Transfers Assistance Needed: walked 100 ft minguard with RW on recent admission; at home walked with minguard assist, no device, no furniture walking ADL's / Homemaking Assistance Needed: Pt indicates spouse was assisting her with ADLs PTA         OT Diagnosis: Generalized weakness;Cognitive deficits   OT Problem List: Decreased strength;Decreased activity tolerance;Impaired balance (sitting and/or standing);Decreased cognition;Decreased safety awareness;Decreased knowledge of use of DME or AE;Cardiopulmonary status limiting activity   OT Treatment/Interventions: Self-care/ADL training;Therapeutic exercise;DME and/or AE instruction;Therapeutic activities;Patient/family education;Balance training    OT Goals(Current goals can be found in the care plan section) Acute Rehab OT Goals Patient Stated Goal: did not state  OT Goal Formulation: With  patient Time For Goal Achievement: 11/22/15 Potential to Achieve Goals: Good ADL Goals Pt Will Perform Eating: with supervision;sitting Pt Will Perform Grooming: with supervision;with set-up;sitting Pt Will Perform Upper Body Bathing: with min assist;sitting Pt Will Transfer to Toilet: with mod assist;stand pivot transfer;bedside commode Additional ADL Goal #1: Pt will sustain attention to familiar ADL task x 4 mins with min cues   OT Frequency: Min 2X/week   Barriers to D/C:            Co-evaluation              End of Session Nurse Communication: Mobility status  Activity Tolerance: Patient limited by lethargy Patient left: in bed;with call bell/phone within reach;with bed alarm set   Time: 1610-9604 OT Time Calculation (min): 27 min Charges:  OT General Charges $OT Visit: 1 Procedure OT Evaluation $OT Eval Moderate Complexity: 1 Procedure OT Treatments $Self Care/Home Management : 8-22 mins G-Codes:    Janine Reller M Nov 10, 2015, 2:19 PM

## 2015-11-09 ENCOUNTER — Inpatient Hospital Stay: Payer: Managed Care, Other (non HMO) | Admitting: Internal Medicine

## 2015-11-09 DIAGNOSIS — D638 Anemia in other chronic diseases classified elsewhere: Secondary | ICD-10-CM | POA: Insufficient documentation

## 2015-11-09 DIAGNOSIS — E875 Hyperkalemia: Secondary | ICD-10-CM

## 2015-11-09 DIAGNOSIS — R000 Tachycardia, unspecified: Secondary | ICD-10-CM

## 2015-11-09 DIAGNOSIS — I959 Hypotension, unspecified: Secondary | ICD-10-CM

## 2015-11-09 DIAGNOSIS — B20 Human immunodeficiency virus [HIV] disease: Secondary | ICD-10-CM

## 2015-11-09 DIAGNOSIS — E871 Hypo-osmolality and hyponatremia: Principal | ICD-10-CM

## 2015-11-09 DIAGNOSIS — R0682 Tachypnea, not elsewhere classified: Secondary | ICD-10-CM

## 2015-11-09 DIAGNOSIS — J189 Pneumonia, unspecified organism: Secondary | ICD-10-CM

## 2015-11-09 LAB — BASIC METABOLIC PANEL
Anion gap: 5 (ref 5–15)
BUN: 11 mg/dL (ref 6–20)
CHLORIDE: 94 mmol/L — AB (ref 101–111)
CO2: 21 mmol/L — ABNORMAL LOW (ref 22–32)
Calcium: 8 mg/dL — ABNORMAL LOW (ref 8.9–10.3)
Creatinine, Ser: 0.75 mg/dL (ref 0.44–1.00)
GFR calc Af Amer: >60 mL/min (ref >60)
GFR calc non Af Amer: >60 mL/min (ref >60)
Glucose, Bld: 146 mg/dL — ABNORMAL HIGH (ref 65–99)
POTASSIUM: 4.3 mmol/L (ref 3.5–5.1)
SODIUM: 120 mmol/L — AB (ref 135–145)

## 2015-11-09 LAB — CBC
HCT: 27.3 % — ABNORMAL LOW (ref 36.0–46.0)
Hemoglobin: 9.9 g/dL — ABNORMAL LOW (ref 12.0–15.0)
MCH: 30.8 pg (ref 26.0–34.0)
MCHC: 36.3 g/dL — AB (ref 30.0–36.0)
MCV: 85 fL (ref 78.0–100.0)
PLATELETS: 150 10*3/uL (ref 150–400)
RBC: 3.21 MIL/uL — AB (ref 3.87–5.11)
RDW: 15.6 % — AB (ref 11.5–15.5)
WBC: 3.1 10*3/uL — ABNORMAL LOW (ref 4.0–10.5)

## 2015-11-09 LAB — HIV-1 RNA ULTRAQUANT REFLEX TO GENTYP+
HIV-1 RNA BY PCR: 200000 {copies}/mL
HIV-1 RNA Quant, Log: 5.301 log10copy/mL

## 2015-11-09 LAB — OSMOLALITY, URINE: Osmolality, Ur: 264 mOsm/kg — ABNORMAL LOW (ref 300–900)

## 2015-11-09 LAB — REFLEX TO GENOSURE(R) MG: HIV GenoSure(R) MG PDF: 0

## 2015-11-09 LAB — SODIUM, URINE, RANDOM: SODIUM UR: 53 mmol/L

## 2015-11-09 LAB — GLUCOSE 6 PHOSPHATE DEHYDROGENASE
G6PDH: 7.1 U/g{Hb} (ref 4.6–13.5)
HEMOGLOBIN: 9.9 g/dL — AB (ref 11.1–15.9)

## 2015-11-09 LAB — MRSA PCR SCREENING: MRSA BY PCR: NEGATIVE

## 2015-11-09 MED ORDER — NYSTATIN 100000 UNIT/ML MT SUSP
5.0000 mL | Freq: Four times a day (QID) | OROMUCOSAL | Status: DC
  Administered 2015-11-09 – 2015-11-11 (×8): 500000 [IU] via ORAL
  Filled 2015-11-09 (×9): qty 5

## 2015-11-09 NOTE — Progress Notes (Signed)
Patient and family oriented to the room. Patient sitting in chair on chair alarrm. Suction set up.

## 2015-11-09 NOTE — Progress Notes (Addendum)
Patient ID: Samantha Kline, female   DOB: Nov 04, 1960, 55 y.o.   MRN: [id number]  PROGRESS NOTE    Clarise Chacko  WUJ:[id number] DOB: [birth year] DOA: 11/06/2015  PCP: No PCP Per Patient   Brief Narrative:  55 y.o. female with past medical history significant for recently diagnosed HIV, recent hospitalization from 10/24/2015 through 10/31/2015 for new diagnosis of HIV pneumonia. Patient was subsequently discharged on Bactrim and prednisone with concerns for pneumocystis pneumonia. Apparently patient was doing well for couple of days after discharge but then started to decline with generalized weakness, loss of appetite, nausea, vomiting and confusion. Patient's family reported no blood with vomiting. No diarrhea. No fevers or chills.  In ED, patient was hemodynamically stable. Blood work was notable for sodium of 110, potassium 5.3, bicarbonate 18, glucose 161. Lactic acid was 2.65 but subsequently normalized. CT head showed no acute intracranial process. Chest x-ray showed no acute abnormalities.   Assessment & Plan:  Acute metabolic encephalopathy / hypoosmolar hyponatremia - Altered mental status likely secondary to profound hyponatremia and sodium level of 110 on the admission - Hyponatremia also likely because of Bactrim. - No acute findings on CT head - Urine osmolality 484, plasma osmolarity 253, urine sodium 61. Patient has hypoosmolar hyponatremia considering low plasma osmolarity. She has normal TSH level which excludes hypothyroidism as etiology of hyponatremia. Her urine osmolarity is 484 which essentially rules out primary polydipsia as usually urine osmolarity in primary polydipsia is less than 100. No evidence of malignancy so less likely SIADH. She does appear to be slightly hypovolemic however urine sodium is typically less than 20 in hyponatremia due to dehydration. All of that combined, Bactrim seems to be the cause of hyponatremia. We stopped that and changed to clindamycin,  dapsone and primaquine per infectious disease recommendations - Sodium better this morning, 120 - Continue to monitor daily BMP  Oral thrush - Already on fluconazole - Added nystatin swish and swallow   HIV on HAART - Continue Tivicay and descovy - Stopped Bactrim because of hyponatremia, continue prednisone - for PCP prophylaxis - Continue azithromycin for MAC prophylaxis  Hyperkalemia - Secondary to Bactrim - She was given Kayexalate  - Repeat potassium within normal limits  Moderate protein calorie malnutrition - In the context of chronic illness - Continue ensure supplementation   DVT prophylaxis: Lovenox subcutaneous Code Status: full code  Family Communication: No family at the bedside Disposition Plan: Remains in step down unit because of hyponatremia   Consultants:   ID - phone call only for change for Bactrim to dapsone  Procedures:   None   Antimicrobials:   HAART  Bactrim  Dapsone 11/08/2015 -->  Clindamycin 11/08/2015 -->  Primaquine 11/08/2015 -->  Fluconazole  Azithromycin    Subjective: No overnight events. No respiratory distress.   Objective: Filed Vitals:   11/09/15 0600 11/09/15 0722 11/09/15 1151 11/09/15 1352  BP: 134/89 125/88 125/80 114/62  Pulse: 85 86 93 113  Temp:  98.3 F (36.8 C) 97.8 F (36.6 C) 97.6 F (36.4 C)  TempSrc:  Oral Axillary   Resp: Height:      Weight:      SpO2: 96% 97% 100% 100%    Intake/Output Summary (Last 24 hours) at 11/09/15 1830 Last data filed at 11/09/15 1100  Gross per 24 hour  Intake   1293 ml  Output    975 ml  Net    318 ml   Filed Weights   11/07/15 0900  Weight: 52.753 kg (116 lb 4.8 oz)    Examination:  General exam: No distress  Respiratory system: No wheezing, no rhonchi  Cardiovascular system: S1 & S2 heard, Rate controlled  Gastrointestinal system: (+) BS, non tender  Central nervous system: No focal neurological deficits. Extremities: No edema,  palpable pulses  Skin: warm and dry  Psychiatry: Normal mood and behavior   Data Reviewed: I have personally reviewed following labs and imaging studies  CBC:  Recent Labs Lab 11/06/15 2047 11/07/15 1052 11/08/15 0357 11/09/15 0314  WBC 6.2 5.0 4.6 3.1*  HGB 13.2 12.7 11.2* 9.9*  HCT 36.5 35.5* 31.7* 27.3*  MCV 83.0 84.3 85.2 85.0  PLT 284 247 209 150   Basic Metabolic Panel:  Recent Labs Lab 11/07/15 1052 11/07/15 1635 11/07/15 2239 11/08/15 0357 11/09/15 0314  NA 117* 116* 119* 118* 120*  K 5.0 4.9 5.1 4.8 4.3  CL 88* 89* 90* 91* 94*  CO2 20* 19* 22 20* 21*  GLUCOSE 98 131* 134* 100* 146*  BUN 16 17 16 16 11   CREATININE 0.86 0.80 0.81 0.84 0.75  CALCIUM 8.7* 8.3* 8.4* 8.3* 8.0*   GFR: Estimated Creatinine Clearance: 65.5 mL/min (by C-G formula based on Cr of 0.75). Liver Function Tests:  Recent Labs Lab 11/06/15 2047  AST 36  ALT 27  ALKPHOS 76  BILITOT 0.6  PROT 8.1  ALBUMIN 2.9*    Recent Labs Lab 11/07/15 0256  LIPASE 22   No results for input(s): AMMONIA in the last 168 hours. Coagulation Profile:  Recent Labs Lab 11/07/15 0256  INR 1.74*   Cardiac Enzymes: No results for input(s): CKTOTAL, CKMB, CKMBINDEX, TROPONINI in the last 168 hours. BNP (last 3 results) No results for input(s): PROBNP in the last 8760 hours. HbA1C: No results for input(s): HGBA1C in the last 72 hours. CBG: No results for input(s): GLUCAP in the last 168 hours. Lipid Profile: No results for input(s): CHOL, HDL, LDLCALC, TRIG, CHOLHDL, LDLDIRECT in the last 72 hours. Thyroid Function Tests:  Recent Labs  11/07/15 0438  TSH 1.260   Anemia Panel: No results for input(s): VITAMINB12, FOLATE, FERRITIN, TIBC, IRON, RETICCTPCT in the last 72 hours. Urine analysis:    Component Value Date/Time   COLORURINE YELLOW 11/06/2015 2236   APPEARANCEUR CLEAR 11/06/2015 2236   LABSPEC 1.029 11/06/2015 2236   PHURINE 6.0 11/06/2015 2236   GLUCOSEU NEGATIVE  11/06/2015 2236   HGBUR NEGATIVE 11/06/2015 2236   BILIRUBINUR NEGATIVE 11/06/2015 2236   KETONESUR NEGATIVE 11/06/2015 2236   PROTEINUR 30* 11/06/2015 2236   NITRITE NEGATIVE 11/06/2015 2236   LEUKOCYTESUR NEGATIVE 11/06/2015 2236   Sepsis Labs: @LABRCNTIP (procalcitonin:4,lacticidven:4)   Recent Results (from the past 240 hour(s))  Culture, blood (Routine x 2)     Status: None (Preliminary result)   Collection Time: 11/06/15  8:30 PM  Result Value Ref Range Status   Specimen Description BLOOD LEFT ARM  Final   Special Requests IN PEDIATRIC BOTTLE 2CC  Final   Culture NO GROWTH 3 DAYS  Final   Report Status PENDING  Incomplete  Culture, blood (Routine x 2)     Status: None (Preliminary result)   Collection Time: 11/06/15  8:45 PM  Result Value Ref Range Status   Specimen Description BLOOD LEFT ARM  Final   Special Requests BOTTLES DRAWN AEROBIC AND ANAEROBIC 4CC   Final   Culture NO GROWTH 3 DAYS  Final   Report Status PENDING  Incomplete  Urine culture     Status:  None   Collection Time: 11/06/15 10:36 PM  Result Value Ref Range Status   Specimen Description URINE, CATHETERIZED  Final   Special Requests NONE  Final   Culture NO GROWTH  Final   Report Status 11/08/2015 FINAL  Final  MRSA PCR Screening     Status: None   Collection Time: 11/09/15 11:54 AM  Result Value Ref Range Status   MRSA by PCR NEGATIVE NEGATIVE Final    Comment:        The GeneXpert MRSA Assay (FDA approved for NASAL specimens only), is one component of a comprehensive MRSA colonization surveillance program. It is not intended to diagnose MRSA infection nor to guide or monitor treatment for MRSA infections.       Radiology Studies: Dg Chest 1 View 11/06/2015  No acute abnormalities.   Ct Head Wo Contrast 11/07/2015  No acute intracranial process.     Scheduled Meds: .  azithromycin  1,200 mg Oral Q Fri  . dolutegravir  50 mg Oral Q1200  . emtricitabine-tenofovir   1 tablet Oral Q1200   . enoxaparin (LOVENOX)   40 mg Subcutaneous Q24H  . feeding supplement   237 mL Oral TID BM  . ferrous sulfate  325 mg Oral Q breakfast  .  fluconazole  100 mg Oral Q Fri  . predniSONE  30 mg Oral Q1200  . sodium polystyrene  30 g Oral Once  . vitamin B-12  100 mcg Oral Daily  .  Vitamin   50,000 Units Oral Q Fri   Continuous Infusions: . dextrose 5 % and 0.9% NaCl 75 mL/hr at 11/09/15 1735     LOS: 2 days    Time spent: 25 minutes  Greater than mental status sodium 50% of the time spent on counseling and coordinating the care.   Manson PasseyEVINE, Caleah Tortorelli, MD [facility] Pager [phone]  If 7PM-7AM, please contact night-coverage [email or web address] Password [email or web address] 11/09/2015, 6:30 PM

## 2015-11-09 NOTE — Consult Note (Signed)
Physical Medicine and Rehabilitation Consult  Reason for Consult: Debility  Referring Physician:  Dr. Elisabeth Pigeon.    HPI: Samantha Kline is a 55 y.o. female with unintentional 50 lbs weight loss over past few months, anorexia, SOB with progressive weakness. She was admitted 07/03-7/10/17 with fever due to PNA and diagnosed with HIV. She was discharged to home on antiviral therapy but continued to have malaise, N/V with poor oral intake and inability to walk. She was readmitted on 07/17 with lethargy, confusion and weakness due to hyponatremia with Na- 110.  She was treated with fluid boluses and kayexalate for management of hyperkalemia.  CT head negative for infection or acute changes. Mentation improving with rise in Na-120 but she continues to have issues with hypotension, weakness and cognitive deficits with delayed processing and poor attention. Therapy evaluations done and CIR recommended for follow up therapy.   Has been declining for months. Husband works days but she has 3 local daughter in laws who have been checking in and helping out at home.   Review of Systems  Constitutional: Positive for weight loss and malaise/fatigue.       Decrease in appetite  HENT: Negative for hearing loss.   Eyes: Positive for blurred vision.  Cardiovascular: Negative for chest pain and palpitations.  Gastrointestinal: Negative for nausea, abdominal pain and constipation.       Sore throat due to thrush  Genitourinary: Negative for dysuria.  Musculoskeletal: Positive for myalgias and back pain.  Skin: Negative for itching.  Neurological: Positive for weakness. Negative for dizziness, tingling and headaches.  All other systems reviewed and are negative.     Past Medical History  Diagnosis Date  . Anemia   . Vitamin D deficiency   . Pneumonia 10/24/2015  . HIV (human immunodeficiency virus infection) (HCC)     11/06/15 Family states it was just diagnosed this week    Past Surgical History    Procedure Laterality Date  . Tubal ligation  1980s    Family History  Problem Relation Age of Onset  . Diabetes Father   . Heart attack Brother   . Hypertension Sister     Social History:  Married.  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  She reports that she quit smoking in 1980's.  She has never used smokeless tobacco. She reports that she drinks alcohol. She reports that she does not use illicit drugs.   Allergies: No Known Allergies   Medications Prior to Admission  Medication Sig Dispense Refill  . azithromycin (ZITHROMAX) 600 MG tablet Take 2 tablets (1,200 mg total) by mouth once a week. (Patient taking differently: Take 1,200 mg by mouth every Friday. ) 4 tablet 6  . dolutegravir (TIVICAY) 50 MG tablet Take 1 tablet (50 mg total) by mouth daily. (Patient taking differently: Take 50 mg by mouth daily at 12 noon. ) 30 tablet 3  . emtricitabine-tenofovir AF (DESCOVY) 200-25 MG tablet Take 1 tablet by mouth daily. (Patient taking differently: Take 1 tablet by mouth daily at 12 noon. ) 30 tablet 3  . ferrous sulfate 325 (65 FE) MG tablet Take 325 mg by mouth daily with breakfast.    . fluconazole (DIFLUCAN) 100 MG tablet Take 1 tablet (100 mg total) by mouth once a week. (Patient taking differently: Take 100 mg by mouth every Friday. ) 4 tablet 6  . predniSONE (DELTASONE) 20 MG tablet Take 40 mg PO q daily for 5 days, followed by  30 mg PO q daily for 11 days. Qty Sufficient (Patient taking differently: Take 30-40 mg by mouth daily at 12 noon. Take 2 tablets (40 mg) by mouth daily at noon for 5 days, then take 1 1/2 tablets (30 mg) daily for 11 days, then stop) 40 tablet 0  . sulfamethoxazole-trimethoprim (BACTRIM DS,SEPTRA DS) 800-160 MG tablet 2 tabs PO TID x 16 days, then 1 tab PO q daily thereafter Qty Sufficient (Patient taking differently: Take 1-23 tablets by mouth See admin instructions. Take 2 tablets by mouth 3 times daily for 16 days, then take 1 tablet  daily.) 36 tablet 0  . vitamin B-12 (CYANOCOBALAMIN) 100 MCG tablet Take 100 mcg by mouth daily.    . Vitamin D, Ergocalciferol, (DRISDOL) 50000 units CAPS capsule Take 50,000 Units by mouth every Friday.       Home: Home Living Family/patient expects to be discharged to:: Private residence Living Arrangements: Spouse/significant other Available Help at Discharge: Family, Available 24 hours/day Type of Home: House Home Access: Stairs to enter Secretary/administrator of Steps: 2 Entrance Stairs-Rails: None Home Layout: Multi-level Alternate Level Stairs-Number of Steps: 5 up and 5 down Alternate Level Stairs-Rails: Left Bathroom Shower/Tub: Engineer, manufacturing systems: Standard Home Equipment: Bedside commode, Environmental consultant - 2 wheels  Functional History: Prior Function Level of Independence: Needs assistance Gait / Transfers Assistance Needed: walked 100 ft minguard with RW on recent admission; at home walked with minguard assist, no device, no furniture walking ADL's / Homemaking Assistance Needed: Pt indicates spouse was assisting her with ADLs PTA  Functional Status:  Mobility: Bed Mobility Overal bed mobility: Needs Assistance Bed Mobility: Rolling, Sidelying to Sit Rolling: Supervision Sidelying to sit: HOB elevated, Min assist (with rail) Sit to sidelying: Max assist General bed mobility comments: slow processing, incr time Transfers Overall transfer level: Needs assistance Equipment used: Rolling walker (2 wheeled), None Transfers: Sit to/from Stand, [employer] Transfers Sit to Stand: Min assist, +2 physical assistance, +2 safety/equipment Stand pivot transfers: Mod assist, +2 safety/equipment General transfer comment: pivotal steps to chair with posterior lean; vc for safe use of RW to stand from chair; assist for balance Ambulation/Gait Ambulation/Gait assistance: Min assist, Mod assist, +2 safety/equipment Ambulation Distance (Feet): 40 Feet Assistive device:  Rolling walker (2 wheeled) Gait Pattern/deviations: Step-through pattern, Decreased stride length, Trunk flexed General Gait Details: first 20 ft much more steady and managing RW with vc only; as she fatigued, she required incr assist for balance and to manage RW Gait velocity: very slow pattern    ADL: ADL Overall ADL's : Needs assistance/impaired Eating/Feeding: Moderate assistance, Sitting Grooming: Wash/dry hands, Wash/dry face, Minimal assistance, Bed level Upper Body Bathing: Total assistance, Bed level Lower Body Bathing: Total assistance, Bed level Upper Body Dressing : Total assistance, Bed level Lower Body Dressing: Total assistance, Bed level Toilet Transfer: Total assistance Toilet Transfer Details (indicate cue type and reason): unable to safely attempt  Toileting- Clothing Manipulation and Hygiene: Total assistance, Bed level Toileting - Clothing Manipulation Details (indicate cue type and reason): Pt incontinent of urine when moving toward EOB.  Assisted with peri care while supine  Functional mobility during ADLs: Maximal assistance General ADL Comments: Pt with focused to brief periods of sustained attention   Cognition: Cognition Overall Cognitive Status: Impaired/Different from baseline Orientation Level: Oriented X4 Cognition Arousal/Alertness: Awake/alert (slow processing, but alert) Behavior During Therapy: Flat affect Overall Cognitive Status: Impaired/Different from baseline Area of Impairment: Orientation, Attention, Following commands, Problem solving Orientation Level: Disoriented to, Time,  Situation Current Attention Level: Selective Following Commands: Follows one step commands with increased time, Follows one step commands consistently Safety/Judgement: Decreased awareness of safety Problem Solving: Slow processing, Decreased initiation, Difficulty sequencing, Requires verbal cues, Requires tactile cues General Comments: Pt very slow to initiate  activity.  Requires step by step cues to sequence task    Blood pressure 125/88, pulse 86, temperature 98.3 F (36.8 C), temperature source Oral, resp. rate 17, height 5\' 4"  (1.626 m), weight 52.753 kg (116 lb 4.8 oz), SpO2 97 %. Physical Exam  Nursing note and vitals reviewed. Constitutional: She is oriented to person, place, and time. She appears listless. She has a sickly appearance. No distress.  HENT:  Head: Normocephalic and atraumatic.  Eyes: Conjunctivae are normal. Pupils are equal, round, and reactive to light.  Neck: Normal range of motion. Neck supple.  Cardiovascular: Normal rate and regular rhythm.   Respiratory: Effort normal. No accessory muscle usage or stridor. No respiratory distress. She has decreased breath sounds in the right lower field and the left lower field.  GI: Soft. Bowel sounds are normal. She exhibits no distension. There is no tenderness.  Musculoskeletal: She exhibits no edema or tenderness.  Neurological: She is oriented to person, place, and time. She appears listless.  Speaks in monotone with flat affect.  She was able to follow 2 step commands and interacted appropriately once engaged. Sensation intact to light touch Motor: 4+/5 throughout DTRs symmetric  Skin: Skin is warm and dry. Rash (petechial rash on bilateral shins.) noted. She is not diaphoretic. No erythema.  Psychiatric: Her affect is blunt. Her speech is delayed. She is slowed. She exhibits a depressed mood.    Results for orders placed or performed during the hospital encounter of 11/06/15 (from the past 24 hour(s))  Osmolality     Status: Abnormal   Collection Time: 11/08/15 12:29 PM  Result Value Ref Range   Osmolality 251 (L) 275 - 295 mOsm/kg  CBC     Status: Abnormal   Collection Time: 11/09/15  3:14 AM  Result Value Ref Range   WBC 3.1 (L) 4.0 - 10.5 K/uL   RBC 3.21 (L) 3.87 - 5.11 MIL/uL   Hemoglobin 9.9 (L) 12.0 - 15.0 g/dL   HCT 40.327.3 (L) 47.436.0 - 25.946.0 %   MCV 85.0 78.0 -  100.0 fL   MCH 30.8 26.0 - 34.0 pg   MCHC 36.3 (H) 30.0 - 36.0 g/dL   RDW 56.315.6 (H) 87.511.5 - 64.315.5 %   Platelets 150 150 - 400 K/uL  Basic metabolic panel     Status: Abnormal   Collection Time: 11/09/15  3:14 AM  Result Value Ref Range   Sodium 120 (L) 135 - 145 mmol/L   Potassium 4.3 3.5 - 5.1 mmol/L   Chloride 94 (L) 101 - 111 mmol/L   CO2 21 (L) 22 - 32 mmol/L   Glucose, Bld 146 (H) 65 - 99 mg/dL   BUN 11 6 - 20 mg/dL   Creatinine, Ser 3.290.75 0.44 - 1.00 mg/dL   Calcium 8.0 (L) 8.9 - 10.3 mg/dL   GFR calc non Af Amer >60 >60 mL/min   GFR calc Af Amer >60 >60 mL/min   Anion gap 5 5 - 15   No results found.  Assessment/Plan: Diagnosis: Debility  Labs and images independently reviewed.  Records reviewed and summated above.  1. Does the need for close, 24 hr/day medical supervision in concert with the patient's rehab needs make it unreasonable for this  patient to be served in a less intensive setting? Yes  2. Co-Morbidities requiring supervision/potential complications: Hyponatremia (cont to monitor, treat if necessary), hyperkalemia (cont to monitor, treat if necessary), hypotension (cont to monitor, treat as necessary), delayed processing and poor attention, tachypnea (monitor RR and O2 Sats with increased physical exertion), Tachycardia (monitor in accordance with pain and increasing activity), anemia of chronic disease (transfuse if necessary to ensure appropriate perfusion for increased activity tolerance), HIV (cont meds) 3. Due to safety, skin/wound care, disease management and patient education, does the patient require 24 hr/day rehab nursing? Yes 4. Does the patient require coordinated care of a physician, rehab nurse, PT (1-2 hrs/day, 5 days/week), OT (1-2 hrs/day, 5 days/week) and SLP (1-2 hrs/day, 5 days/week) to address physical and functional deficits in the context of the above medical diagnosis(es)? Yes Addressing deficits in the following areas: balance, endurance,  locomotion, strength, transferring, toileting, swallowing and psychosocial support 5. Can the patient actively participate in an intensive therapy program of at least 3 hrs of therapy per day at least 5 days per week? Yes 6. The potential for patient to make measurable gains while on inpatient rehab is excellent 7. Anticipated functional outcomes upon discharge from inpatient rehab are modified independent and supervision  with PT, modified independent and supervision with OT, independent and modified independent with SLP. 8. Estimated rehab length of stay to reach the above functional goals is: 12-16 days. 9. Does the patient have adequate social supports and living environment to accommodate these discharge functional goals? Yes 10. Anticipated D/C setting: Home 11. Anticipated post D/C treatments: HH therapy and Home excercise program 12. Overall Rehab/Functional Prognosis: good  RECOMMENDATIONS: This patient's condition is appropriate for continued rehabilitative care in the following setting: Pt would benefit from CIR, however, pt and wife believe pt is close to her baseline and would like to return home with [facility] and is also concerned about the cost of CIR.  If family ultimately decided to go home with [facility], would recommend close follow up. Patient has agreed to participate in recommended program. No Note that insurance prior authorization may be required for reimbursement for recommended care.  Comment: Rehab Admissions Coordinator to follow up.  Maryla Morrow, MD 11/09/2015

## 2015-11-09 NOTE — Care Management Note (Signed)
Case Management Note  Patient Details  Name: Samantha Kline MRN: [id number] Date of Birth: 1960-06-26  Subjective/Objective:    Pt lives with spouse, was just discharged on 10/31/15.  At that time, CM had faxed home health orders to [facility] but, according to pt, no one had ever contacted her.  TC to [facility] who stated they had referred the pt to [facility] but services were never started.  Per [facility] rep, if pt needs home health @ time of discharge, new orders will need to be faxed to them.                             Expected Discharge Plan:  Home w Home Health Services  Discharge planning Services  CM Consult  Status of Service:  In process, will continue to follow  Magdalene RiverMayo, Viana Sleep T, RN 11/09/2015, 11:32 AM

## 2015-11-09 NOTE — Progress Notes (Signed)
Physical Therapy Treatment Patient Details Name: Samantha Kline MRN: [id number] DOB: 26-Oct-1960 Today's Date: 11/09/2015    History of Present Illness 55 y.o. female with medical history significant of recently diagnosed HIV, recently treated pneumonia, who presents with altered mental status, generalized weakness, nausea and vomiting.Head CT and CXR negative. 50 lb unintentional weight loss over the past 5 - 6 months along with decreased appetite.     PT Comments    Patient more alert however slow processing continues. Walked 40 ft with 2 person assist (for safety) and RW with pt very fatigued at end of session. Patient agrees she needs to be stronger prior to returning home.   Follow Up Recommendations  CIR;Supervision/Assistance - 24 hour     Equipment Recommendations  Other (comment) (TBA if goes home)    Recommendations for Other Services       Precautions / Restrictions Precautions Precautions: Fall    Mobility  Bed Mobility Overal bed mobility: Needs Assistance Bed Mobility: Rolling;Sidelying to Sit Rolling: Supervision Sidelying to sit: HOB elevated;Min assist (with rail)       General bed mobility comments: slow processing, incr time  Transfers Overall transfer level: Needs assistance Equipment used: Rolling walker (2 wheeled);None Transfers: Sit to/from [employer] Sit to Stand: Min assist;+2 physical assistance;+2 safety/equipment Stand pivot transfers: Mod assist;+2 safety/equipment       General transfer comment: pivotal steps to chair with posterior lean; vc for safe use of RW to stand from chair; assist for balance  Ambulation/Gait Ambulation/Gait assistance: Min assist;Mod assist;+2 safety/equipment Ambulation Distance (Feet): 40 Feet Assistive device: Rolling walker (2 wheeled) Gait Pattern/deviations: Step-through pattern;Decreased stride length;Trunk flexed Gait velocity: very slow pattern   General Gait Details: first 20 ft  much more steady and managing RW with vc only; as she fatigued, she required incr assist for balance and to manage RW   Stairs            Wheelchair Mobility    Modified Rankin (Stroke Patients Only)       Balance Overall balance assessment: Needs assistance Sitting-balance support: Bilateral upper extremity supported;Feet supported Sitting balance-Leahy Scale: Poor Sitting balance - Comments: drifts posterior or right   Standing balance support: No upper extremity supported Standing balance-Leahy Scale: Poor Standing balance comment: posterior lean                     Cognition Arousal/Alertness: Awake/alert (slow processing, but alert) Behavior During Therapy: Flat affect Overall Cognitive Status: Impaired/Different from baseline Area of Impairment: Orientation;Attention;Following commands;Problem solving   Current Attention Level: Selective   Following Commands: Follows one step commands with increased time;Follows one step commands consistently     Problem Solving: Slow processing;Decreased initiation;Difficulty sequencing;Requires verbal cues;Requires tactile cues General Comments: Pt very slow to initiate activity.  Requires step by step cues to sequence task     Exercises General Exercises - Lower Extremity Ankle Circles/Pumps: AROM;Both;10 reps Heel Slides: AROM;Both;5 reps    General Comments General comments (skin integrity, edema, etc.): HR to 128, 02 sats >92% on RA      Pertinent Vitals/Pain Pain Assessment: No/denies pain    Home Living                      Prior Function            PT Goals (current goals can now be found in the care plan section) Acute Rehab PT Goals Patient Stated Goal: be able to go  home and get upstairs to bedroom Time For Goal Achievement: 11/21/15 Progress towards PT goals: Progressing toward goals    Frequency  Min 3X/week    PT Plan Current plan remains appropriate    Co-evaluation              End of Session Equipment Utilized During Treatment: Gait belt Activity Tolerance: Patient limited by fatigue Patient left: with call bell/phone within reach;in chair (alarm pad under pt; no available alarm on unit)     Time: [id number] PT Time Calculation (min) (ACUTE ONLY): 21 min  Charges:  $Gait Training: 8-22 mins                    G Codes:      Ebany Bowermaster 11/09/2015, 9:59 AM  Pager [phone]

## 2015-11-09 NOTE — Progress Notes (Signed)
Rehab Admissions Coordinator Note:  Patient was screened by Trish MageLogue, Toua Stites M for appropriateness for an Inpatient Acute Rehab Consult.  At this time, we are recommending Inpatient Rehab consult.  Trish MageLogue, Sande Pickert M 11/09/2015, 8:14 AM  I can be reached at [phone].

## 2015-11-09 NOTE — Progress Notes (Signed)
CSW spoke with patient and patient's husband again. They are refusing SNF and will think about CIR versus going home, but husband is very interested in taking patient home.  CSW signing off.  Osborne Cascoadia Jayvier Burgher LCSWA [phone]

## 2015-11-10 ENCOUNTER — Inpatient Hospital Stay (HOSPITAL_COMMUNITY): Payer: Managed Care, Other (non HMO)

## 2015-11-10 LAB — CBC
HEMATOCRIT: 25.2 % — AB (ref 36.0–46.0)
HEMOGLOBIN: 8.9 g/dL — AB (ref 12.0–15.0)
MCH: 30.3 pg (ref 26.0–34.0)
MCHC: 35.3 g/dL (ref 30.0–36.0)
MCV: 85.7 fL (ref 78.0–100.0)
Platelets: 146 10*3/uL — ABNORMAL LOW (ref 150–400)
RBC: 2.94 MIL/uL — AB (ref 3.87–5.11)
RDW: 16.7 % — AB (ref 11.5–15.5)
WBC: 2.4 10*3/uL — AB (ref 4.0–10.5)

## 2015-11-10 LAB — BASIC METABOLIC PANEL
ANION GAP: 5 (ref 5–15)
BUN: 9 mg/dL (ref 6–20)
CHLORIDE: 99 mmol/L — AB (ref 101–111)
CO2: 22 mmol/L (ref 22–32)
Calcium: 7.8 mg/dL — ABNORMAL LOW (ref 8.9–10.3)
Creatinine, Ser: 0.65 mg/dL (ref 0.44–1.00)
Glucose, Bld: 118 mg/dL — ABNORMAL HIGH (ref 65–99)
POTASSIUM: 3.5 mmol/L (ref 3.5–5.1)
SODIUM: 126 mmol/L — AB (ref 135–145)

## 2015-11-10 MED ORDER — METOPROLOL TARTRATE 5 MG/5ML IV SOLN
5.0000 mg | Freq: Every day | INTRAVENOUS | Status: DC | PRN
  Administered 2015-11-10 – 2015-11-16 (×2): 5 mg via INTRAVENOUS
  Filled 2015-11-10 (×2): qty 5

## 2015-11-10 NOTE — Progress Notes (Addendum)
Patient ID: Samantha Kline, female   DOB: 1961/04/02, 55 y.o.   MRN: [id number]  PROGRESS NOTE    Samantha Kline  YNW:[id number] DOB: [birth year] DOA: 11/06/2015  PCP: No PCP Per Patient   Brief Narrative:  55 y.o. female with past medical history significant for recently diagnosed HIV, recent hospitalization from 10/24/2015 through 10/31/2015 for new diagnosis of HIV pneumonia. Patient was subsequently discharged on Bactrim and prednisone with concerns for pneumocystis pneumonia. Apparently patient was doing well for couple of days after discharge but then started to decline with generalized weakness, loss of appetite, nausea, vomiting and confusion. Patient's family reported no blood with vomiting. No diarrhea. No fevers or chills.  In ED, patient was hemodynamically stable. Blood work was notable for sodium of 110, potassium 5.3, bicarbonate 18, glucose 161. Lactic acid was 2.65 but subsequently normalized. CT head showed no acute intracranial process. Chest x-ray showed no acute abnormalities.   Assessment & Plan:  Acute metabolic encephalopathy / hypoosmolar hyponatremia - Likely secondary to severe hyponatremia. Sodium is low as 110 on the admission - Hyponatremia also likely because of Bactrim. - No acute findings on CT head - Mental status has improved with improvement in sodium level - Urine osmolality 484, plasma osmolarity 253, urine sodium 61. Patient has hypoosmolar hyponatremia considering low plasma osmolarity. She has normal TSH level which excludes hypothyroidism as etiology of hyponatremia. Her urine osmolarity is 484 which essentially rules out primary polydipsia as usually urine osmolarity in primary polydipsia is less than 100. No evidence of malignancy so less likely SIADH. She does appear to be slightly hypovolemic however urine sodium is typically less than 20 in hyponatremia due to dehydration. All of that combined, Bactrim seems to be the cause of hyponatremia.  - Awaiting  BMP results this morning  Oral thrush - Continue fluconazole - Continue nystatin swish and swallow   HIV on HAART - Continue Tivicay and descovy  Sepsis, unspecified organism / Possible PCP - We stopped the Bactrim because of hyponatremia. Changed to Dapsone but due to concern for actual PCP infection added clinda, primaquine. She was on prednisone prior to the admission - Today, sepsis criteria met with fever, tachycardia, leukopenia - Sepsis order set placed - Check UA, urine culture, blood cultures, LDH, procalcitonin, lactic acid - Follow up CXR - Appreciate ID consult very much   MAC prophylaxis - Continue azithromycin weekly  Anemia of chronic disease - Hemoglobin is 9.9  Hyperkalemia - Secondary to Bactrim - She was given Kayexalate  - Repeat potassium within normal limits - BMP is pending this morning  Moderate protein calorie malnutrition - In the context of chronic illness - Continue ensure supplementation   DVT prophylaxis: Lovenox subcutaneous Code Status: full code  Family Communication: No family at the bedside Disposition Plan: Awaiting if patient can go to inpatient rehabilitation. Patient's husband wanted to have patient come home when she is stable for discharge but open to possible inpatient rehabilitation.   Consultants:   ID - phone call only for change for Bactrim to dapsone  Procedures:   None   Antimicrobials:   HAART  Bactrim, stop 11/08/2015  Dapsone 11/08/2015 --> 11/11/2015  Clindamycin 11/08/2015 -->  Primaquine 11/08/2015 -->  Fluconazole  Azithromycin    Subjective: No overnight events.  Objective: Filed Vitals:   11/09/15 1151 11/09/15 1352 11/09/15 2135 11/10/15 0509  BP: 125/80 114/62 117/68 126/71  Pulse: 93 113 105 93  Temp: 97.8 F (36.6 C) 97.6 F (36.4 C) 98.5 F (36.9  C) 98.7 F (37.1 C)  TempSrc: Axillary     Resp: '18 18 18 18  '$ Height:      Weight:      SpO2: 100% 100% 99% 100%    Intake/Output  Summary (Last 24 hours) at 11/10/15 6789 Last data filed at 11/10/15 0555  Gross per 24 hour  Intake 1298.75 ml  Output    925 ml  Net 373.75 ml   Filed Weights   11/07/15 0900  Weight: 52.753 kg (116 lb 4.8 oz)    Examination:  General exam: Sleeping but easily arousable; has oral thrush  Respiratory system: Bilateral air entry, no wheezing  Cardiovascular system: S1 & S2 heard, RRR Gastrointestinal system: (+) BS, nontender abdomen, non-distended Central nervous system: Nonfocal Extremities: No swelling, appreciate pulses bilaterally Skin: Skin is warm and dry no ulcers  Psychiatry: Normal mood, no agitation or restlessness  Data Reviewed: I have personally reviewed following labs and imaging studies  CBC:  Recent Labs Lab 11/06/15 2047 11/07/15 1052 11/08/15 0357 11/09/15 0314  WBC 6.2 5.0 4.6 3.1*  HGB 13.2 12.7 11.2* 9.9*  HCT 36.5 35.5* 31.7* 27.3*  MCV 83.0 84.3 85.2 85.0  PLT 284 247 209 381   Basic Metabolic Panel:  Recent Labs Lab 11/07/15 1052 11/07/15 1635 11/07/15 2239 11/08/15 0357 11/09/15 0314  NA 117* 116* 119* 118* 120*  K 5.0 4.9 5.1 4.8 4.3  CL 88* 89* 90* 91* 94*  CO2 20* 19* 22 20* 21*  GLUCOSE 98 131* 134* 100* 146*  BUN '16 17 16 16 11  '$ CREATININE 0.86 0.80 0.81 0.84 0.75  CALCIUM 8.7* 8.3* 8.4* 8.3* 8.0*   GFR: Estimated Creatinine Clearance: 65.5 mL/min (by C-G formula based on Cr of 0.75). Liver Function Tests:  Recent Labs Lab 11/06/15 2047  AST 36  ALT 27  ALKPHOS 76  BILITOT 0.6  PROT 8.1  ALBUMIN 2.9*    Recent Labs Lab 11/07/15 0256  LIPASE 22   No results for input(s): AMMONIA in the last 168 hours. Coagulation Profile:  Recent Labs Lab 11/07/15 0256  INR 1.74*   Cardiac Enzymes: No results for input(s): CKTOTAL, CKMB, CKMBINDEX, TROPONINI in the last 168 hours. BNP (last 3 results) No results for input(s): PROBNP in the last 8760 hours. HbA1C: No results for input(s): HGBA1C in the last 72  hours. CBG: No results for input(s): GLUCAP in the last 168 hours. Lipid Profile: No results for input(s): CHOL, HDL, LDLCALC, TRIG, CHOLHDL, LDLDIRECT in the last 72 hours. Thyroid Function Tests: No results for input(s): TSH, T4TOTAL, FREET4, T3FREE, THYROIDAB in the last 72 hours. Anemia Panel: No results for input(s): VITAMINB12, FOLATE, FERRITIN, TIBC, IRON, RETICCTPCT in the last 72 hours. Urine analysis:    Component Value Date/Time   COLORURINE YELLOW 11/06/2015 2236   APPEARANCEUR CLEAR 11/06/2015 2236   LABSPEC 1.029 11/06/2015 2236   PHURINE 6.0 11/06/2015 2236   GLUCOSEU NEGATIVE 11/06/2015 2236   HGBUR NEGATIVE 11/06/2015 2236   BILIRUBINUR NEGATIVE 11/06/2015 2236   KETONESUR NEGATIVE 11/06/2015 2236   PROTEINUR 30* 11/06/2015 2236   NITRITE NEGATIVE 11/06/2015 2236   LEUKOCYTESUR NEGATIVE 11/06/2015 2236   Sepsis Labs: '@LABRCNTIP'$ (procalcitonin:4,lacticidven:4)   Recent Results (from the past 240 hour(s))  Culture, blood (Routine x 2)     Status: None (Preliminary result)   Collection Time: 11/06/15  8:30 PM  Result Value Ref Range Status   Specimen Description BLOOD LEFT ARM  Final   Special Requests IN PEDIATRIC BOTTLE [facility]  Final  Culture NO GROWTH 3 DAYS  Final   Report Status PENDING  Incomplete  Culture, blood (Routine x 2)     Status: None (Preliminary result)   Collection Time: 11/06/15  8:45 PM  Result Value Ref Range Status   Specimen Description BLOOD LEFT ARM  Final   Special Requests BOTTLES DRAWN AEROBIC AND ANAEROBIC 4CC   Final   Culture NO GROWTH 3 DAYS  Final   Report Status PENDING  Incomplete  Urine culture     Status: None   Collection Time: 11/06/15 10:36 PM  Result Value Ref Range Status   Specimen Description URINE, CATHETERIZED  Final   Special Requests NONE  Final   Culture NO GROWTH  Final   Report Status 11/08/2015 FINAL  Final  MRSA PCR Screening     Status: None   Collection Time: 11/09/15 11:54 AM  Result Value Ref Range  Status   MRSA by PCR NEGATIVE NEGATIVE Final      Radiology Studies: Dg Chest 1 View 11/06/2015  No acute abnormalities.   Ct Head Wo Contrast 11/07/2015  No acute intracranial process.     Scheduled Meds: .  azithromycin  1,200 mg Oral Q Fri  . dolutegravir  50 mg Oral Q1200  . emtricitabine-tenofovir   1 tablet Oral Q1200  . enoxaparin (LOVENOX)   40 mg Subcutaneous Q24H  . feeding supplement   237 mL Oral TID BM  . ferrous sulfate  325 mg Oral Q breakfast  .  fluconazole  100 mg Oral Q Fri  . predniSONE  30 mg Oral Q1200  . sodium polystyrene  30 g Oral Once  . vitamin B-12  100 mcg Oral Daily  .  Vitamin   50,000 Units Oral Q Fri   Continuous Infusions: . dextrose 5 % and 0.9% NaCl 75 mL/hr at 11/10/15 0544     LOS: 3 days    Time spent: 25 minutes  Greater than mental status sodium 50% of the time spent on counseling and coordinating the care.   Leisa Lenz, MD [facility] Pager [phone]  If 7PM-7AM, please contact night-coverage [email or web address] Password [facility] 11/10/2015, 8:32 AM

## 2015-11-10 NOTE — Progress Notes (Signed)
Speech Language Pathology Treatment: Dysphagia  Patient Details Name: Samantha Kline MRN: [id number] DOB: Oct 16, 1960 Today's Date: 11/10/2015 Time: [id number] SLP Time Calculation (min) (ACUTE ONLY): 12 min  Assessment / Plan / Recommendation Clinical Impression  Pt demonstrates ongoing lethargy with POs requiring total assist for bring cup and straw to lips, cues to take sips. Pt with oral holding and each sip followed by cough. Pt awake, but weak and almost unwilling to eat and drink without assist. RN reports pt did not eat am meal due to lethargy. Pt at risk for aspiration due to weakness and mentation. Will /fu with objective test as pt may need modified diet to reduce risk.    HPI HPI: Samantha Kline is a 55 y.o. female with medical history significant of recently diagnosed HIV, recently treated pneumonia, who presents with altered mental status, generalized weakness, nausea and vomiting.Head CT and CXR negative. 50 lb unintentional weight loss over the past 5 - 6 months along with decreased appetite.      SLP Plan  MBS     Recommendations  Diet recommendations: Dysphagia 3 (mechanical soft);Thin liquid Liquids provided via: Cup;Straw Medication Administration: Whole meds with puree Supervision: Staff to assist with self feeding;Full supervision/cueing for compensatory strategies Compensations: Slow rate;Small sips/bites Postural Changes and/or Swallow Maneuvers: Seated upright 90 degrees;Upright 30-60 min after meal             General recommendations: Rehab consult Oral Care Recommendations: Oral care BID Follow up Recommendations: Inpatient Rehab Plan: MBS     GO               Harlon DittyBonnie Hether Anselmo, MA CCC-SLP [phone]  Claudine MoutonDeBlois, Samantha Kline Caroline 11/10/2015, 11:06 AM

## 2015-11-10 NOTE — Progress Notes (Signed)
I met with pt at bedside to discuss a possible inpt rehab admission prior to d/c home. Pt asks me to call her husband. I have called and left a message for her spouse to contact me to discuss a possible inpt rehab admission pending insurance approval. I will initiate [employer] authorization pending pt and spouse request for admission. [phone]

## 2015-11-10 NOTE — Progress Notes (Signed)
PT Cancellation Note  Patient Details Name: Samantha Kline MRN: [id number] DOB: 02/26/1961   Cancelled Treatment:    Reason Eval/Treat Not Completed: Fatigue/lethargy limiting ability to participatePt too lethargic to participate in therapy. Husband present. PT will check on pt later as time allows.    Derek MoundKellyn R Lucita Montoya Caitlin Ainley, PTA Pager: [phone]   11/10/2015, 2:23 PM

## 2015-11-10 NOTE — Progress Notes (Signed)
MBSS complete. Full report located under chart review in imaging section. Messiyah Waterson, MA CCC-SLP [phone]  

## 2015-11-11 ENCOUNTER — Inpatient Hospital Stay (HOSPITAL_COMMUNITY): Payer: Managed Care, Other (non HMO)

## 2015-11-11 LAB — CBC
HEMATOCRIT: 23.5 % — AB (ref 36.0–46.0)
HEMOGLOBIN: 8.2 g/dL — AB (ref 12.0–15.0)
MCH: 30.5 pg (ref 26.0–34.0)
MCHC: 34.9 g/dL (ref 30.0–36.0)
MCV: 87.4 fL (ref 78.0–100.0)
Platelets: 133 10*3/uL — ABNORMAL LOW (ref 150–400)
RBC: 2.69 MIL/uL — ABNORMAL LOW (ref 3.87–5.11)
RDW: 17.3 % — ABNORMAL HIGH (ref 11.5–15.5)
WBC: 2 10*3/uL — ABNORMAL LOW (ref 4.0–10.5)

## 2015-11-11 LAB — BASIC METABOLIC PANEL
Anion gap: 4 — ABNORMAL LOW (ref 5–15)
BUN: 12 mg/dL (ref 6–20)
CALCIUM: 7.6 mg/dL — AB (ref 8.9–10.3)
CO2: 26 mmol/L (ref 22–32)
CREATININE: 0.69 mg/dL (ref 0.44–1.00)
Chloride: 99 mmol/L — ABNORMAL LOW (ref 101–111)
GFR calc Af Amer: >60 mL/min (ref >60)
GLUCOSE: 117 mg/dL — AB (ref 65–99)
Potassium: 3.5 mmol/L (ref 3.5–5.1)
Sodium: 129 mmol/L — ABNORMAL LOW (ref 135–145)

## 2015-11-11 LAB — CULTURE, BLOOD (ROUTINE X 2)
CULTURE: NO GROWTH
Culture: NO GROWTH

## 2015-11-11 LAB — PROCALCITONIN: PROCALCITONIN: 0.16 ng/mL

## 2015-11-11 LAB — LACTIC ACID, PLASMA
Lactic Acid, Venous: 2.2 mmol/L (ref 0.5–1.9)
Lactic Acid, Venous: 2 mmol/L (ref 0.5–1.9)

## 2015-11-11 LAB — LACTATE DEHYDROGENASE: LDH: 297 U/L — ABNORMAL HIGH (ref 98–192)

## 2015-11-11 MED ORDER — FLUCONAZOLE IN SODIUM CHLORIDE 400-0.9 MG/200ML-% IV SOLN
400.0000 mg | INTRAVENOUS | Status: DC
  Administered 2015-11-11 – 2015-11-24 (×13): 400 mg via INTRAVENOUS
  Filled 2015-11-11 (×15): qty 200

## 2015-11-11 MED ORDER — PREDNISONE 20 MG PO TABS
20.0000 mg | ORAL_TABLET | Freq: Every day | ORAL | Status: AC
  Administered 2015-11-12 – 2015-11-15 (×4): 20 mg via ORAL
  Filled 2015-11-11 (×5): qty 1

## 2015-11-11 MED ORDER — CETYLPYRIDINIUM CHLORIDE 0.05 % MT LIQD
7.0000 mL | Freq: Two times a day (BID) | OROMUCOSAL | Status: DC
  Administered 2015-11-11 – 2015-11-25 (×28): 7 mL via OROMUCOSAL

## 2015-11-11 NOTE — Progress Notes (Signed)
Patient ID: Samantha Kline, female   DOB: 1960/12/10, 55 y.o.   MRN: [id number]  PROGRESS NOTE    Samantha FewHythia Bronder  WUJ:[id number] DOB: 1960/12/10 DOA: 11/06/2015  PCP: No PCP Per Patient   Brief Narrative:  55 y.o. female with past medical history significant for recently diagnosed HIV, recent hospitalization from 10/24/2015 through 10/31/2015 for new diagnosis of HIV pneumonia. Patient was subsequently discharged on Bactrim and prednisone with concerns for pneumocystis pneumonia. Apparently patient was doing well for couple of days after discharge but then started to decline with generalized weakness, loss of appetite, nausea, vomiting and confusion. Patient's family reported no blood with vomiting. No diarrhea. No fevers or chills.  In ED, patient was hemodynamically stable. Blood work was notable for sodium of 110, potassium 5.3, bicarbonate 18, glucose 161. Lactic acid was 2.65 but subsequently normalized. CT head showed no acute intracranial process. Chest x-ray showed no acute abnormalities.   Assessment & Plan:  Acute metabolic encephalopathy / hypoosmolar hyponatremia - Likely secondary to severe hyponatremia. Sodium is low as 110 on the admission - Hyponatremia also likely because of Bactrim - No acute findings on CT head - Urine osmolality 484, plasma osmolarity 253, urine sodium 61. Patient has hypoosmolar hyponatremia considering low plasma osmolarity. She has normal TSH level which excludes hypothyroidism as etiology of hyponatremia. Her urine osmolarity is 484 which essentially rules out primary polydipsia as usually urine osmolarity in primary polydipsia is less than 100. No evidence of malignancy so less likely SIADH. She does appear to be slightly hypovolemic however urine sodium is typically less than 20 in hyponatremia due to dehydration. All of that combined, Bactrim seems to be the cause of hyponatremia.  -Her sodium has significantly improved with stopping bactrim and is it 129  this am  - Better mental status over past few days  Oral thrush - Continue fluconazole - Continue nystatin swish and swallow  - Per SLP, meds with puree and thin liquids   HIV on HAART - Continue Tivicay and descovy - We stopped the Bactrim because of hyponatremia. She is still on prednisone.  - For PCP prophylaxis she is on dapsone, clindamycin and primaquine - Continue azithromycin for MAC prophylaxis  Anemia of chronic disease - Hemoglobin 8.2-8.9 range   Hyperkalemia - Secondary to Bactrim - She was given Kayexalate  - Repeat potassium within normal limits  Moderate protein calorie malnutrition - In the context of chronic illness - Continue ensure supplementation   DVT prophylaxis: Lovenox subcutaneous Code Status: full code  Family Communication: No family at the bedside Disposition Plan: Awaiting if patient can go to inpatient rehabilitation.   Consultants:   ID - phone call only   SLP  Procedures:   None   Antimicrobials:   HAART  Bactrim, stop 11/08/2015  Dapsone 11/08/2015 -->  Clindamycin 11/08/2015 -->  Primaquine 11/08/2015 -->  Fluconazole  Azithromycin    Subjective: No overnight events.  Objective: Filed Vitals:   11/10/15 1453 11/10/15 2120 11/10/15 2356 11/11/15 0531  BP: 120/77 111/56 112/66 133/76  Pulse: 97 95 96 91  Temp: 101.2 F (38.4 C) 98 F (36.7 C) 98 F (36.7 C) 97.9 F (36.6 C)  TempSrc:  Oral Oral   Resp: 17 18 18 18   Height:      Weight:      SpO2: 96% 97%  95%    Intake/Output Summary (Last 24 hours) at 11/11/15 1153 Last data filed at 11/11/15 1125  Gross per 24 hour  Intake  0 ml  Output    350 ml  Net   -350 ml   Filed Weights   11/07/15 0900  Weight: 52.753 kg (116 lb 4.8 oz)    Examination:  General exam: oral thrush, no distress  Respiratory system: NO wheezing, no rhonchi  Cardiovascular system: S1 & S2 heard, Rate controlled  Gastrointestinal system: (+) BS, non distended    Central nervous system: No focal deficits  Extremities: No edema, palpable pulses  Skin: warm, dry  Psychiatry: normal mood and behavior   Data Reviewed: I have personally reviewed following labs and imaging studies  CBC:  Recent Labs Lab 11/07/15 1052 11/08/15 0357 11/09/15 0314 11/10/15 0820 11/11/15 0551  WBC 5.0 4.6 3.1* 2.4* 2.0*  HGB 12.7 11.2* 9.9* 8.9* 8.2*  HCT 35.5* 31.7* 27.3* 25.2* 23.5*  MCV 84.3 85.2 85.0 85.7 87.4  PLT 247 209 150 146* 133*   Basic Metabolic Panel:  Recent Labs Lab 11/07/15 2239 11/08/15 0357 11/09/15 0314 11/10/15 0820 11/11/15 0551  NA 119* 118* 120* 126* 129*  K 5.1 4.8 4.3 3.5 3.5  CL 90* 91* 94* 99* 99*  CO2 22 20* 21* 22 26  GLUCOSE 134* 100* 146* 118* 117*  BUN 16 16 11 9 12   CREATININE 0.81 0.84 0.75 0.65 0.69  CALCIUM 8.4* 8.3* 8.0* 7.8* 7.6*   GFR: Estimated Creatinine Clearance: 65.5 mL/min (by C-G formula based on Cr of 0.69). Liver Function Tests:  Recent Labs Lab 11/06/15 2047  AST 36  ALT 27  ALKPHOS 76  BILITOT 0.6  PROT 8.1  ALBUMIN 2.9*    Recent Labs Lab 11/07/15 0256  LIPASE 22   No results for input(s): AMMONIA in the last 168 hours. Coagulation Profile:  Recent Labs Lab 11/07/15 0256  INR 1.74*   Cardiac Enzymes: No results for input(s): CKTOTAL, CKMB, CKMBINDEX, TROPONINI in the last 168 hours. BNP (last 3 results) No results for input(s): PROBNP in the last 8760 hours. HbA1C: No results for input(s): HGBA1C in the last 72 hours. CBG: No results for input(s): GLUCAP in the last 168 hours. Lipid Profile: No results for input(s): CHOL, HDL, LDLCALC, TRIG, CHOLHDL, LDLDIRECT in the last 72 hours. Thyroid Function Tests: No results for input(s): TSH, T4TOTAL, FREET4, T3FREE, THYROIDAB in the last 72 hours. Anemia Panel: No results for input(s): VITAMINB12, FOLATE, FERRITIN, TIBC, IRON, RETICCTPCT in the last 72 hours. Urine analysis:    Component Value Date/Time   COLORURINE  YELLOW 11/06/2015 2236   APPEARANCEUR CLEAR 11/06/2015 2236   LABSPEC 1.029 11/06/2015 2236   PHURINE 6.0 11/06/2015 2236   GLUCOSEU NEGATIVE 11/06/2015 2236   HGBUR NEGATIVE 11/06/2015 2236   BILIRUBINUR NEGATIVE 11/06/2015 2236   KETONESUR NEGATIVE 11/06/2015 2236   PROTEINUR 30* 11/06/2015 2236   NITRITE NEGATIVE 11/06/2015 2236   LEUKOCYTESUR NEGATIVE 11/06/2015 2236   Sepsis Labs: @LABRCNTIP (procalcitonin:4,lacticidven:4)   Recent Results (from the past 240 hour(s))  Culture, blood (Routine x 2)     Status: None (Preliminary result)   Collection Time: 11/06/15  8:30 PM  Result Value Ref Range Status   Specimen Description BLOOD LEFT ARM  Final   Special Requests IN PEDIATRIC BOTTLE 2CC  Final   Culture NO GROWTH 3 DAYS  Final   Report Status PENDING  Incomplete  Culture, blood (Routine x 2)     Status: None (Preliminary result)   Collection Time: 11/06/15  8:45 PM  Result Value Ref Range Status   Specimen Description BLOOD LEFT ARM  Final  Special Requests BOTTLES DRAWN AEROBIC AND ANAEROBIC 4CC   Final   Culture NO GROWTH 3 DAYS  Final   Report Status PENDING  Incomplete  Urine culture     Status: None   Collection Time: 11/06/15 10:36 PM  Result Value Ref Range Status   Specimen Description URINE, CATHETERIZED  Final   Special Requests NONE  Final   Culture NO GROWTH  Final   Report Status 11/08/2015 FINAL  Final  MRSA PCR Screening     Status: None   Collection Time: 11/09/15 11:54 AM  Result Value Ref Range Status   MRSA by PCR NEGATIVE NEGATIVE Final      Radiology Studies: Dg Chest 1 View 11/06/2015  No acute abnormalities.   Ct Head Wo Contrast 11/07/2015  No acute intracranial process.    Marland Kitchen antiseptic oral rinse  7 mL Mouth Rinse BID  . azithromycin  1,200 mg Oral Q Fri  . clindamycin  600 mg Oral Q8H   And  . primaquine  30 mg Oral Daily  . [START ON 11/18/2015] dapsone  100 mg Oral Daily  . dolutegravir  50 mg Oral Q1200  .  emtricitabine-tenofovir AF  1 tablet Oral Q1200  . enoxaparin (LOVENOX) injection  40 mg Subcutaneous Q24H  . feeding supplement (ENSURE ENLIVE)  237 mL Oral TID BM  . ferrous sulfate  325 mg Oral Q breakfast  . fluconazole  100 mg Oral Q Fri  . nystatin  5 mL Oral QID  . predniSONE  30 mg Oral Q1200  . sodium chloride flush  3 mL Intravenous Q12H  . sodium polystyrene  30 g Oral Once  . vitamin B-12  100 mcg Oral Daily  . Vitamin D (Ergocalciferol)  50,000 Units Oral Q Fri     Continuous Infusions: . dextrose 5 % and 0.9% NaCl 75 mL/hr at 11/10/15 1934     LOS: 4 days    Time spent: 25 minutes  Greater than mental status sodium 50% of the time spent on counseling and coordinating the care.   Manson Passey, MD [facility] Pager [phone]  If 7PM-7AM, please contact night-coverage [email or web address] Password [facility] 11/11/2015, 11:53 AM

## 2015-11-11 NOTE — Progress Notes (Signed)
Speech Language Pathology Treatment: Dysphagia  Patient Details Name: Samantha Kline MRN: [id number] DOB: [birth year] Today's Date: 11/11/2015 Time: [id number] SLP Time Calculation (min) (ACUTE ONLY): 37 min  Assessment / Plan / Recommendation Clinical Impression  Pt in bed lying on her side with retained oral secretions upon SLP entrance to room. She did not recognize nor attempt to clear oral secretions and required SLP assistance to clear with suction.  High aspiration risk of secretions.    Trials of po conducted after oral care - including cherry Icee via tsp, Ensure and water.  Pt orally holding all boluses - despite verbal/visual cues to swallow.  Swallow initiation was mproved with hand over hand assistance and improved sensory input **cold and flavor of icee.  Delayed swallow noted with cough x 1/6 boluses =- pt reports baseline cough on secretions also.    After consumption, pt began vomiting = likely vomiting all of her intake= viscous and chocolate colored.  Pt did not cough on emesis.  She reports "secretions" made her vomit and not "nausea".  Pt admits to vomiting prior to admission and one other time during hospital coarse - uncertain to cause??????  ? Could she have an esophageal/GI deficit?   Intake has remained POOR - concerning for ability to meet nutritional needs  - ? Would pt benefit from alternative means of nutrition and/or palliative consult given progressive weight loss?    Modified diet to increase efficiency and decrease aspiration risk.  Reviewed precautions/dysphagia mitigation strategies with pt and RN and provided in writing.  Will follow up for tolerance, advancement appropriateness, pt/family education. Marland Kitchen.      HPI HPI: Samantha Kline is a 55 y.o. female with medical history significant of recently diagnosed HIV, recently treated pneumonia, who presents with altered mental status, generalized weakness, nausea and vomiting.Head CT and CXR negative. 50 lb unintentional  weight loss over the past 5 - 6 months along with decreased appetite.  Pt underwent MBS yesterday and tody is seen for tolerance, readiness for advancement.       SLP Plan  Continue with current plan of care     Recommendations  Diet recommendations: Thin liquid (full liquids) Liquids provided via: Cup;Straw Medication Administration: Whole meds with puree Supervision: Full supervision/cueing for compensatory strategies Compensations: Slow rate;Small sips/bites;Other (Comment) (oral suction before and after po due to orally retained secretions) Postural Changes and/or Swallow Maneuvers: Seated upright 90 degrees;Upright 30-60 min after meal             Oral Care Recommendations: Oral care before and after PO Follow up Recommendations: Skilled Nursing facility Plan: Continue with current plan of care     GO               Donavan Burnetamara Kelyn Ponciano, MS [facility] SLP [phone]

## 2015-11-11 NOTE — Consult Note (Signed)
[facility]       Reason for Consult: PCP pneumonia    Referring Physician: Dr. Elisabeth Pigeon  Principal Problem:   Hyponatremia Active Problems:   Human immunodeficiency virus (HIV) disease (HCC)   CAP (community acquired pneumonia)   Malnutrition of moderate degree   Acute encephalopathy   Hyperkalemia   Nausea & vomiting   Pressure ulcer   Elevated lactic acid level   Arterial hypotension   Tachypnea   Tachycardia   Anemia of chronic disease   . antiseptic oral rinse  7 mL Mouth Rinse BID  . azithromycin  1,200 mg Oral Q Fri  . clindamycin  600 mg Oral Q8H   And  . primaquine  30 mg Oral Daily  . dolutegravir  50 mg Oral Q1200  . emtricitabine-tenofovir AF  1 tablet Oral Q1200  . enoxaparin (LOVENOX) injection  40 mg Subcutaneous Q24H  . feeding supplement (ENSURE ENLIVE)  237 mL Oral TID BM  . ferrous sulfate  325 mg Oral Q breakfast  . fluconazole  100 mg Oral Q Fri  . nystatin  5 mL Oral QID  . predniSONE  30 mg Oral Q1200  . sodium chloride flush  3 mL Intravenous Q12H  . vitamin B-12  100 mcg Oral Daily  . Vitamin D (Ergocalciferol)  50,000 Units Oral Q Fri    Recommendations: Continue with primaquine, clindamycin, steroids for probable PCP pneumonia for 21 days total Steroids can be 20 mg daily, instead of 30 mg and will decrease Continue with ARVs I will check AFB blood culture  No indication for empiric vancomycin and zosyn without a source I will stop nystatin and use fluconazole IV 400 mg daily for presumed esophageal candida.  If no improvement soon with esophagus, will need GI input for possible EGD, ?CMV Stop dapsone   Assessment: She has weakness and fever and recent diagnosis of HIV with CD4 of 20 and probable PCP pneumonia on treatment.  Certainly multiple etiologies possible for the fever including the PCP pneumonia, immune reconstitution, non infectious.  Blood culture ordered, I will add as above.  Candida,  Mycobacteria.   On 30 mg of prednisone.    Hyponatremia - improved since stopping Bactrim 3 days ago. Hypoosmolar hyponatremia.    Thrush/dysphagia - difficulty swallowing, speech eval noted, vomiting.  I suspect she has esophageal candida.  She has oral thrush. CD4 20.  This could be cause of fever.   Weakness - very weak.  I suspect she will need rehab and husband and pt agreeable.   HIV - I am not sure if the patient has disclosed to the husband and was unable to ask.  Started Tivicay and descovy during initial hospitalization.   Dr. Ninetta Lights will follow up tomorrow  Antibiotics: Clinda/primaquine/steroids day 3 Bactrim for 13 days Fluconazole weekly Azithromycin weekly dapsone prophyaxis  HPI: Samantha Kline is a 55 y.o. female with recent illness including weight loss, weakness, who presented to [facility] initially early July with acutre respiratory failure and found to be HIV positive with a CD4 count of 20.  She has had about a 50 lb weight loss prior to her initial presentation in early July and found to have PCP pneumonia. Still with weakness and fever yesterday and today.  Breathing ok, no significant cough. No diarrhea.  No dysuria.     Review of Systems:  Constitutional: positive for fatigue, malaise, anorexia and weight loss or negative for chills and sweats Respiratory: negative for  cough, sputum or dyspnea on exertion Gastrointestinal: positive for dysphagia and odynophagia, negative for diarrhea and constipation Integument/breast: negative for rash Musculoskeletal: negative for myalgias and arthralgias All other systems reviewed and are negative   Past Medical History  Diagnosis Date  . Anemia   . Vitamin D deficiency   . Pneumonia 10/24/2015  . HIV (human immunodeficiency virus infection) (HCC)     11/06/15 Family states it was just diagnosed this week    Social History  Substance Use Topics  . Smoking status: Never Smoker   . Smokeless tobacco: Never Used  . Alcohol  Use: Yes     Comment: 10/24/2015 "nothing since the 1980s"    Family History  Problem Relation Age of Onset  . Diabetes Father   . Heart attack Brother   . Hypertension Sister     No Known Allergies  Physical Exam: Constitutional: alert, chronically ill appearing Filed Vitals:   11/11/15 0531 11/11/15 1330  BP: 133/76 127/80  Pulse: 91 121  Temp: 97.9 F (36.6 C) 102.7 F (39.3 C)  Resp: 18 16   EYES: anicteric ENMT:  +thrush, patient unable to open mouth much but noted Cardiovascular: Cor RRR Respiratory: CTA B; normal respiratory effort GI: Bowel sounds are normal, liver is not enlarged, spleen is not enlarged Musculoskeletal: no pedal edema noted Skin: negatives: no rash Hematologic: no cervical lad  Lab Results  Component Value Date   WBC 2.0* 11/11/2015   HGB 8.2* 11/11/2015   HCT 23.5* 11/11/2015   MCV 87.4 11/11/2015   PLT 133* 11/11/2015    Lab Results  Component Value Date   CREATININE 0.69 11/11/2015   BUN 12 11/11/2015   NA 129* 11/11/2015   K 3.5 11/11/2015   CL 99* 11/11/2015   CO2 26 11/11/2015    Lab Results  Component Value Date   ALT 27 11/06/2015   AST 36 11/06/2015   ALKPHOS 76 11/06/2015     Microbiology: Recent Results (from the past 240 hour(s))  Culture, blood (Routine x 2)     Status: None   Collection Time: 11/06/15  8:30 PM  Result Value Ref Range Status   Specimen Description BLOOD LEFT ARM  Final   Special Requests IN PEDIATRIC BOTTLE 2CC  Final   Culture NO GROWTH 5 DAYS  Final   Report Status 11/11/2015 FINAL  Final  Culture, blood (Routine x 2)     Status: None   Collection Time: 11/06/15  8:45 PM  Result Value Ref Range Status   Specimen Description BLOOD LEFT ARM  Final   Special Requests BOTTLES DRAWN AEROBIC AND ANAEROBIC 4CC   Final   Culture NO GROWTH 5 DAYS  Final   Report Status 11/11/2015 FINAL  Final  Urine culture     Status: None   Collection Time: 11/06/15 10:36 PM  Result Value Ref Range Status    Specimen Description URINE, CATHETERIZED  Final   Special Requests NONE  Final   Culture NO GROWTH  Final   Report Status 11/08/2015 FINAL  Final  MRSA PCR Screening     Status: None   Collection Time: 11/09/15 11:54 AM  Result Value Ref Range Status   MRSA by PCR NEGATIVE NEGATIVE Final    Comment:        The GeneXpert MRSA Assay (FDA approved for NASAL specimens only), is one component of a comprehensive MRSA colonization surveillance program. It is not intended to diagnose MRSA infection nor to guide or monitor treatment for  MRSA infections.     Staci Righter, MD [facility] [email or web address] [phone] pager  [phone] cell 11/11/2015, 2:35 PM

## 2015-11-11 NOTE — Progress Notes (Signed)
ProceduresCritical lab received from lab.  Lactic acid 2.0. Will page Dr.

## 2015-11-11 NOTE — Progress Notes (Signed)
I met with pt and her spouse at bedside to discuss a possible inpt rehab admission pending medical readiness and pt and spouse agreement to plan to admit. We discussed goals for inpt rehab. They will discuss over the weekend and we will follow up Monday with possible admission. [phone]

## 2015-11-11 NOTE — Progress Notes (Signed)
Physical Therapy Treatment Patient Details Name: Samantha Kline MRN: [id number] DOB: [birth year] Today's Date: 11/11/2015    History of Present Illness 55 y.o. female with medical history significant of recently diagnosed HIV, recently treated pneumonia, who presents with altered mental status, generalized weakness, nausea and vomiting.Head CT and CXR negative. 50 lb unintentional weight loss over the past 5 - 6 months along with decreased appetite.     PT Comments    Patient tolerated sitting EOB for short period with min guard/min A for sitting balance. Max/total A +2 for supine to sit. Husband present for session. HR elevated to 143 in sitting EOB. Current plan remains appropriate.   Follow Up Recommendations  CIR;Supervision/Assistance - 24 hour     Equipment Recommendations  Other (comment) (TBA if goes home)    Recommendations for Other Services       Precautions / Restrictions Precautions Precautions: Fall Restrictions Weight Bearing Restrictions: No    Mobility  Bed Mobility Overal bed mobility: Needs Assistance Bed Mobility: Supine to Sit;Rolling;Sit to Supine Rolling: Max assist   Supine to sit: Max assist;+2 for physical assistance Sit to supine: Total assist;+2 for physical assistance   General bed mobility comments: cues for sequencing, technique, and hand placement with use of rail to roll and maintain sidelying; assist to "helicopter" into sitting/supine with use of bed pads; pt able to assist very little with supine to sit  Transfers                    Ambulation/Gait                 Stairs            Wheelchair Mobility    Modified Rankin (Stroke Patients Only)       Balance     Sitting balance-Leahy Scale: Fair Sitting balance - Comments: poor to fair sitting balance with assist needed initially; pt with posterior and R lateral lean; pt not assisting with bilat hands on bed despite verbal and tactile cues Postural control:  Posterior lean;Right lateral lean                          Cognition Arousal/Alertness: Awake/alert (slow processing, but alert) Behavior During Therapy: Flat affect Overall Cognitive Status: Impaired/Different from baseline Area of Impairment: Following commands;Problem solving       Following Commands: Follows one step commands with increased time;Follows one step commands consistently     Problem Solving: Slow processing;Decreased initiation;Difficulty sequencing;Requires verbal cues;Requires tactile cues      Exercises      General Comments General comments (skin integrity, edema, etc.): pt very weak and unable to attempt OOB mobility this session; husband present for session and RN present last half of session; pt became emotional during session       Pertinent Vitals/Pain Pain Assessment: No/denies pain    Home Living                      Prior Function            PT Goals (current goals can now be found in the care plan section) Acute Rehab PT Goals Patient Stated Goal: none stated Time For Goal Achievement: 11/21/15 Progress towards PT goals: Progressing toward goals    Frequency  Min 3X/week    PT Plan Current plan remains appropriate    Co-evaluation  End of Session   Activity Tolerance: Patient limited by fatigue Patient left: with call bell/phone within reach;in bed;with nursing/sitter in room;with family/visitor present (alarm pad under pt; no available alarm on unit)     Time: 1423-1440 PT Time Calculation (min) (ACUTE ONLY): 17 min  Charges:  $Therapeutic Activity: 8-22 mins                    G Codes:      Derek Mound, PTA Pager: [phone]   11/11/2015, 3:54 PM

## 2015-11-12 DIAGNOSIS — B59 Pneumocystosis: Secondary | ICD-10-CM

## 2015-11-12 LAB — BASIC METABOLIC PANEL
Anion gap: 4 — ABNORMAL LOW (ref 5–15)
BUN: 11 mg/dL (ref 6–20)
CALCIUM: 7.7 mg/dL — AB (ref 8.9–10.3)
CHLORIDE: 97 mmol/L — AB (ref 101–111)
CO2: 27 mmol/L (ref 22–32)
CREATININE: 0.6 mg/dL (ref 0.44–1.00)
GFR calc Af Amer: >60 mL/min (ref >60)
GFR calc non Af Amer: >60 mL/min (ref >60)
Glucose, Bld: 161 mg/dL — ABNORMAL HIGH (ref 65–99)
Potassium: 3.7 mmol/L (ref 3.5–5.1)
SODIUM: 128 mmol/L — AB (ref 135–145)

## 2015-11-12 LAB — CBC
HEMATOCRIT: 23.1 % — AB (ref 36.0–46.0)
HEMOGLOBIN: 7.9 g/dL — AB (ref 12.0–15.0)
MCH: 30.2 pg (ref 26.0–34.0)
MCHC: 34.2 g/dL (ref 30.0–36.0)
MCV: 88.2 fL (ref 78.0–100.0)
Platelets: 127 10*3/uL — ABNORMAL LOW (ref 150–400)
RBC: 2.62 MIL/uL — ABNORMAL LOW (ref 3.87–5.11)
RDW: 17.9 % — AB (ref 11.5–15.5)
WBC: 1.6 10*3/uL — ABNORMAL LOW (ref 4.0–10.5)

## 2015-11-12 NOTE — Progress Notes (Signed)
Speech Language Pathology Treatment: Dysphagia  Patient Details Name: Samantha Kline MRN: [id number] DOB: 12-Nov-1960 Today's Date: 11/12/2015 Time: 4854-6270 SLP Time Calculation (min) (ACUTE ONLY): 19 min  Assessment / Plan / Recommendation Clinical Impression  Pt sitting up in bed and appropriately responded and greeted SLP. Pt was finishing a chocolate ensure. She had no s/s of aspiration with liquids. Pt exhibits a short oral hold followed by multiple swallows. She stated she was not ready to start soft foods. Pt asked to start soft solids (pureed foods) tomorrow. Pt's cognition continues to improve and her aspiration risk to decrease. Continue current treatment plan.    HPI HPI: Samantha Kline is a 55 y.o. female with medical history significant of recently diagnosed HIV, recently treated pneumonia, who presents with altered mental status, generalized weakness, nausea and vomiting.Head CT and CXR negative. 50 lb unintentional weight loss over the past 5 - 6 months along with decreased appetite.  Pt underwent MBS yesterday and tody is seen for tolerance, readiness for advancement.       SLP Plan  Continue with current plan of care     Recommendations  Diet recommendations: Thin liquid Liquids provided via: Cup;Straw Medication Administration: Whole meds with puree Supervision: Full supervision/cueing for compensatory strategies Compensations: Slow rate;Small sips/bites;Other (Comment) Postural Changes and/or Swallow Maneuvers: Seated upright 90 degrees;Upright 30-60 min after meal             Oral Care Recommendations: Oral care before and after PO Follow up Recommendations: Skilled Nursing facility Plan: Continue with current plan of care     GO                Lindalou Hose Julian Medina, MA, CCC-SLP 11/12/2015 12:45 PM

## 2015-11-12 NOTE — Progress Notes (Signed)
Kline ID: Samantha Kline, female   DOB: 20-May-1960, 55 y.o.   MRN: [id number]  PROGRESS NOTE    Samantha Kline  WUJ:[id number] DOB: Sep 06, 1960 DOA: 11/06/2015  PCP: Samantha Kline   Brief Narrative:  55 y.o. female with past medical history significant for recently diagnosed HIV, recent hospitalization from 10/24/2015 through 10/31/2015 for new diagnosis of HIV pneumonia. Kline was subsequently discharged on Bactrim and prednisone with concerns for pneumocystis pneumonia. Apparently Kline was doing well for couple of days after discharge but then started to decline with generalized weakness, loss of appetite, nausea, vomiting and confusion. Kline's family reported Samantha blood with vomiting. Samantha diarrhea. Samantha fevers or chills.  In ED, Kline was hemodynamically stable. Blood work was notable for sodium of 110, potassium 5.3, bicarbonate 18, glucose 161. Lactic acid was 2.65 but subsequently normalized. CT head showed Samantha acute intracranial process. Chest x-ray showed Samantha acute abnormalities.   Assessment & Plan:  Acute metabolic encephalopathy / hypoosmolar hyponatremia - Likely secondary to severe hyponatremia. Sodium is low as 110 on the admission - Hyponatremia also likely because of Bactrim - Samantha acute findings on CT head - Sodium improving since Bactrim stopped   Oral thrush - Continue fluconazole, dose increased per ID to 400 mg instead of 100 mg daily  - Per SLP, meds with puree and thin liquids   HIV on HAART - Continue Tivicay and descovy - We stopped the Bactrim because of hyponatremia. She is still on prednisone.  - For PCP prophylaxis she is on dapsone, clindamycin and primaquine - Continue azithromycin for MAC prophylaxis  Anemia of chronic disease - Hemoglobin down to 7.9 this am - Check CBC in am  Hyperkalemia - Secondary to Bactrim - She was given Kayexalate  - Repeat potassium within normal limits  Moderate protein calorie malnutrition - In the context of  chronic illness - Continue ensure supplementation   DVT prophylaxis: Lovenox subcutaneous Code Status: full code  Family Communication: Samantha family at the bedside Disposition Plan: spiking fever so not yet stable for discharge    Consultants:   ID - phone call only   SLP  Procedures:   None   Antimicrobials:   HAART  Bactrim, stop 11/08/2015  Dapsone 11/08/2015 -->  Clindamycin 11/08/2015 -->  Primaquine 11/08/2015 -->  Fluconazole  Azithromycin    Subjective: Samantha overnight events.  Objective: Filed Vitals:   11/11/15 1330 11/11/15 2253 11/12/15 0544 11/12/15 1333  BP: 127/80 123/72 132/82 115/56  Pulse: 121 91 85 100  Temp: 102.7 F (39.3 C) 98.3 F (36.8 C) 99.1 F (37.3 C) 98.5 F (36.9 C)  TempSrc: Oral Oral Oral Oral  Resp: Height:      Weight:      SpO2: 94% 96% 95% 100%    Intake/Output Summary (Last 24 hours) at 11/12/15 1927 Last data filed at 11/12/15 1403  Gross per 24 hour  Intake    600 ml  Output    700 ml  Net   -100 ml   Filed Weights   11/07/15 0900  Weight: 52.753 kg (116 lb 4.8 oz)    Examination:  General exam: oral thrush, Samantha distress  Respiratory system: Samantha wheezing, Samantha rhonchi  Cardiovascular system: S1 & S2 heard, RRR Gastrointestinal system: (+) BS, non distended, non tender  Central nervous system: Samantha focal deficits  Extremities: Samantha edema, appreciate pulses  Skin: Samantha ulcers, Samantha lesions  Psychiatry: normal mood, Samantha agitation.   Data Reviewed:  I have personally reviewed following labs and imaging studies  CBC:  Recent Labs Lab 11/08/15 0357 11/09/15 0314 11/10/15 0820 11/11/15 0551 11/12/15 0500  WBC 4.6 3.1* 2.4* 2.0* 1.6*  HGB 11.2* 9.9* 8.9* 8.2* 7.9*  HCT 31.7* 27.3* 25.2* 23.5* 23.1*  MCV 85.2 85.0 85.7 87.4 88.2  PLT 209 150 146* 133* 127*   Basic Metabolic Panel:  Recent Labs Lab 11/08/15 0357 11/09/15 0314 11/10/15 0820 11/11/15 0551 11/12/15 0500  NA 118* 120* 126* 129*  128*  K 4.8 4.3 3.5 3.5 3.7  CL 91* 94* 99* 99* 97*  CO2 20* 21* 22 26 27   GLUCOSE 100* 146* 118* 117* 161*  BUN 16 11 9 12 11   CREATININE 0.84 0.75 0.65 0.69 0.60  CALCIUM 8.3* 8.0* 7.8* 7.6* 7.7*   GFR: Estimated Creatinine Clearance: 65.5 mL/min (by C-G formula based on Cr of 0.6). Liver Function Tests:  Recent Labs Lab 11/06/15 2047  AST 36  ALT 27  ALKPHOS 76  BILITOT 0.6  PROT 8.1  ALBUMIN 2.9*    Recent Labs Lab 11/07/15 0256  LIPASE 22   Samantha results for input(s): AMMONIA in the last 168 hours. Coagulation Profile:  Recent Labs Lab 11/07/15 0256  INR 1.74*   Cardiac Enzymes: Samantha results for input(s): CKTOTAL, CKMB, CKMBINDEX, TROPONINI in the last 168 hours. BNP (last 3 results) Samantha results for input(s): PROBNP in the last 8760 hours. HbA1C: Samantha results for input(s): HGBA1C in the last 72 hours. CBG: Samantha results for input(s): GLUCAP in the last 168 hours. Lipid Profile: Samantha results for input(s): CHOL, HDL, LDLCALC, TRIG, CHOLHDL, LDLDIRECT in the last 72 hours. Thyroid Function Tests: Samantha results for input(s): TSH, T4TOTAL, FREET4, T3FREE, THYROIDAB in the last 72 hours. Anemia Panel: Samantha results for input(s): VITAMINB12, FOLATE, FERRITIN, TIBC, IRON, RETICCTPCT in the last 72 hours. Urine analysis:    Component Value Date/Time   COLORURINE YELLOW 11/06/2015 2236   APPEARANCEUR CLEAR 11/06/2015 2236   LABSPEC 1.029 11/06/2015 2236   PHURINE 6.0 11/06/2015 2236   GLUCOSEU NEGATIVE 11/06/2015 2236   HGBUR NEGATIVE 11/06/2015 2236   BILIRUBINUR NEGATIVE 11/06/2015 2236   KETONESUR NEGATIVE 11/06/2015 2236   PROTEINUR 30* 11/06/2015 2236   NITRITE NEGATIVE 11/06/2015 2236   LEUKOCYTESUR NEGATIVE 11/06/2015 2236   Sepsis Labs: @LABRCNTIP (procalcitonin:4,lacticidven:4)   Recent Results (from the past 240 hour(s))  Culture, blood (Routine x 2)     Status: None (Preliminary result)   Collection Time: 11/06/15  8:30 PM  Result Value Ref Range Status    Specimen Description BLOOD LEFT ARM  Final   Special Requests IN PEDIATRIC BOTTLE 2CC  Final   Culture Samantha GROWTH 3 DAYS  Final   Report Status PENDING  Incomplete  Culture, blood (Routine x 2)     Status: None (Preliminary result)   Collection Time: 11/06/15  8:45 PM  Result Value Ref Range Status   Specimen Description BLOOD LEFT ARM  Final   Special Requests BOTTLES DRAWN AEROBIC AND ANAEROBIC 4CC   Final   Culture Samantha GROWTH 3 DAYS  Final   Report Status PENDING  Incomplete  Urine culture     Status: None   Collection Time: 11/06/15 10:36 PM  Result Value Ref Range Status   Specimen Description URINE, CATHETERIZED  Final   Special Requests NONE  Final   Culture Samantha GROWTH  Final   Report Status 11/08/2015 FINAL  Final  MRSA PCR Screening     Status: None   Collection  Time: 11/09/15 11:54 AM  Result Value Ref Range Status   MRSA by PCR NEGATIVE NEGATIVE Final      Radiology Studies: Dg Chest 1 View 11/06/2015  Samantha acute abnormalities.   Ct Head Wo Contrast 11/07/2015  Samantha acute intracranial process.    Marland Kitchen antiseptic oral rinse  7 mL Mouth Rinse BID  . azithromycin  1,200 mg Oral Q Fri  . clindamycin  600 mg Oral Q8H   And  . primaquine  30 mg Oral Daily  . dolutegravir  50 mg Oral Q1200  . emtricitabine-tenofovir AF  1 tablet Oral Q1200  . enoxaparin (LOVENOX) injection  40 mg Subcutaneous Q24H  . feeding supplement (ENSURE ENLIVE)  237 mL Oral TID BM  . ferrous sulfate  325 mg Oral Q breakfast  . fluconazole (DIFLUCAN) IV  400 mg Intravenous Q24H  . predniSONE  20 mg Oral Q1200  . sodium chloride flush  3 mL Intravenous Q12H  . vitamin B-12  100 mcg Oral Daily  . Vitamin D (Ergocalciferol)  50,000 Units Oral Q Fri     Continuous Infusions: . dextrose 5 % and 0.9% NaCl 75 mL/hr at 11/12/15 1907     LOS: 5 days    Time spent: 15 minutes  Greater than mental status sodium 50% of the time spent on counseling and coordinating the care.   Manson Passey, MD [facility] Pager [phone]  If 7PM-7AM, please contact night-coverage [email or web address] Password [facility] 11/12/2015, 7:27 PM

## 2015-11-12 NOTE — Progress Notes (Signed)
INFECTIOUS DISEASE PROGRESS NOTE  ID: Samantha Kline is a 55 y.o. female with  Principal Problem:   Hyponatremia Active Problems:   Human immunodeficiency virus (HIV) disease (HCC)   CAP (community acquired pneumonia)   Malnutrition of moderate degree   Acute encephalopathy   Hyperkalemia   Nausea & vomiting   Pressure ulcer   Elevated lactic acid level   Arterial hypotension   Tachypnea   Tachycardia   Anemia of chronic disease  Subjective: Eating well.   Abtx:  Anti-infectives    Start     Dose/Rate Route Frequency Ordered Stop   11/18/15 1000  dapsone tablet 100 mg  Status:  Discontinued     100 mg Oral Daily 11/08/15 1236 11/11/15 1422   11/16/15 1000  sulfamethoxazole-trimethoprim (BACTRIM DS,SEPTRA DS) 800-160 MG per tablet 1 tablet  Status:  Discontinued     1 tablet Oral Daily 11/07/15 0952 11/08/15 1203   11/11/15 1600  fluconazole (DIFLUCAN) IVPB 400 mg     400 mg 100 mL/hr over 120 Minutes Intravenous Every 24 hours 11/11/15 1517     11/11/15 1000  azithromycin (ZITHROMAX) tablet 1,200 mg     1,200 mg Oral Every Fri 11/07/15 0118     11/11/15 1000  fluconazole (DIFLUCAN) tablet 100 mg  Status:  Discontinued     100 mg Oral Every Fri 11/07/15 0118 11/11/15 1517   11/08/15 1400  clindamycin (CLEOCIN) capsule 600 mg     600 mg Oral Every 8 hours 11/08/15 1235 11/17/15 2359   11/08/15 1400  primaquine tablet 30 mg     30 mg Oral Daily 11/08/15 1235 11/17/15 2359   11/08/15 1215  dapsone tablet 100 mg  Status:  Discontinued     100 mg Oral Daily 11/08/15 1203 11/08/15 1234   11/07/15 1200  dolutegravir (TIVICAY) tablet 50 mg     50 mg Oral Daily 11/07/15 0118     11/07/15 1200  emtricitabine-tenofovir AF (DESCOVY) 200-25 MG per tablet 1 tablet     1 tablet Oral Daily 11/07/15 0118     11/07/15 1030  sulfamethoxazole-trimethoprim (BACTRIM DS,SEPTRA DS) 800-160 MG per tablet 2 tablet  Status:  Discontinued     2 tablet Oral 3 times daily 11/07/15 0952 11/08/15  1203   11/07/15 0115  sulfamethoxazole-trimethoprim (BACTRIM DS,SEPTRA DS) 800-160 MG per tablet 1-23 tablet  Status:  Discontinued     1-23 tablet Oral See admin instructions 11/07/15 0118 11/07/15 0948      Medications:  Scheduled: . antiseptic oral rinse  7 mL Mouth Rinse BID  . azithromycin  1,200 mg Oral Q Fri  . clindamycin  600 mg Oral Q8H   And  . primaquine  30 mg Oral Daily  . dolutegravir  50 mg Oral Q1200  . emtricitabine-tenofovir AF  1 tablet Oral Q1200  . enoxaparin (LOVENOX) injection  40 mg Subcutaneous Q24H  . feeding supplement (ENSURE ENLIVE)  237 mL Oral TID BM  . ferrous sulfate  325 mg Oral Q breakfast  . fluconazole (DIFLUCAN) IV  400 mg Intravenous Q24H  . predniSONE  20 mg Oral Q1200  . sodium chloride flush  3 mL Intravenous Q12H  . vitamin B-12  100 mcg Oral Daily  . Vitamin D (Ergocalciferol)  50,000 Units Oral Q Fri    Objective: Vital signs in last 24 hours: Temp:  [98.3 F (36.8 C)-102.7 F (39.3 C)] 99.1 F (37.3 C) (07/22 0544) Pulse Rate:  [85-121] 85 (07/22 0544) Resp:  [  16-17] 17 (07/22 0544) BP: (123-132)/(72-82) 132/82 mmHg (07/22 0544) SpO2:  [94 %-96 %] 95 % (07/22 0544)   General appearance: alert and no distress Throat: normal findings: oropharynx pink & moist without lesions or evidence of thrush Resp: clear to auscultation bilaterally Cardio: regular rate and rhythm GI: normal findings: bowel sounds normal and soft, non-tender  Lab Results  Recent Labs  11/11/15 0551 11/12/15 0500  WBC 2.0* 1.6*  HGB 8.2* 7.9*  HCT 23.5* 23.1*  NA 129* 128*  K 3.5 3.7  CL 99* 97*  CO2 26 27  BUN 12 11  CREATININE 0.69 0.60   Liver Panel No results for input(s): PROT, ALBUMIN, AST, ALT, ALKPHOS, BILITOT, BILIDIR, IBILI in the last 72 hours. Sedimentation Rate No results for input(s): ESRSEDRATE in the last 72 hours. C-Reactive Protein No results for input(s): CRP in the last 72 hours.  Microbiology: Recent Results (from the  past 240 hour(s))  Culture, blood (Routine x 2)     Status: None   Collection Time: 11/06/15  8:30 PM  Result Value Ref Range Status   Specimen Description BLOOD LEFT ARM  Final   Special Requests IN PEDIATRIC BOTTLE 2CC  Final   Culture NO GROWTH 5 DAYS  Final   Report Status 11/11/2015 FINAL  Final  Culture, blood (Routine x 2)     Status: None   Collection Time: 11/06/15  8:45 PM  Result Value Ref Range Status   Specimen Description BLOOD LEFT ARM  Final   Special Requests BOTTLES DRAWN AEROBIC AND ANAEROBIC 4CC   Final   Culture NO GROWTH 5 DAYS  Final   Report Status 11/11/2015 FINAL  Final  Urine culture     Status: None   Collection Time: 11/06/15 10:36 PM  Result Value Ref Range Status   Specimen Description URINE, CATHETERIZED  Final   Special Requests NONE  Final   Culture NO GROWTH  Final   Report Status 11/08/2015 FINAL  Final  MRSA PCR Screening     Status: None   Collection Time: 11/09/15 11:54 AM  Result Value Ref Range Status   MRSA by PCR NEGATIVE NEGATIVE Final    Comment:        The GeneXpert MRSA Assay (FDA approved for NASAL specimens only), is one component of a comprehensive MRSA colonization surveillance program. It is not intended to diagnose MRSA infection nor to guide or monitor treatment for MRSA infections.     Studies/Results: Dg Chest Port 1 View  11/11/2015  CLINICAL DATA:  Fever. EXAM: PORTABLE CHEST 1 VIEW COMPARISON:  Chest x-ray dated 11/06/2015. FINDINGS: Study is hypoinspiratory with crowding of the perihilar and bibasilar bronchovascular markings. New subtle opacity is seen at the left lung base overlying the left heart shadow, suspicious for pneumonia. Lungs appear otherwise clear. Heart size is normal. Atherosclerotic changes noted at the aortic arch. Osseous structures about the chest are unremarkable. IMPRESSION: Low lung volumes. Subtle opacity at the left lung base, suspicious for pneumonia. Aortic atherosclerosis. Electronically  Signed   By: Bary Richard M.D.   On: 11/11/2015 21:19   Dg Swallowing Func-speech Pathology  11/10/2015  Objective Swallowing Evaluation: Type of Study: MBS-Modified Barium Swallow Study Patient Details Name: Samantha Kline MRN: [id number] Date of Birth: 1961-01-18 Today's Date: 11/10/2015 Time: SLP Start Time (ACUTE ONLY): 1340-SLP Stop Time (ACUTE ONLY): 1400 SLP Time Calculation (min) (ACUTE ONLY): 20 min Past Medical History: Past Medical History Diagnosis Date . Anemia  . Vitamin D deficiency  .  Pneumonia 10/24/2015 . HIV (human immunodeficiency virus infection) (HCC)    11/06/15 Family states it was just diagnosed this week Past Surgical History: Past Surgical History Procedure Laterality Date . Tubal ligation  1980s HPI: Laurice Crooke is a 55 y.o. female with medical history significant of recently diagnosed HIV, recently treated pneumonia, who presents with altered mental status, generalized weakness, nausea and vomiting.Head CT and CXR negative. 50 lb unintentional weight loss over the past 5 - 6 months along with decreased appetite. No Data Recorded Assessment / Plan / Recommendation CHL IP CLINICAL IMPRESSIONS 11/10/2015 Therapy Diagnosis Mild to moderate oral phase dysphagia Clinical Impression Pt demonstrates a mild/moderate oral dysphagia secondary to altered mental status. Upon [facility] to [facility] suite pt was orally holding thick pink secretions which were suctioned from her mouth. Pt orally held liquid and puree boluses, requiring suction in 2/4 trials to remove the bolus. SHe initaited swallowing with hand over hand cup sips with no penetration seen, though no coughing was observed during this assessment. Pt coughed after each sip in the room. Pt has potential for aspiration and is also not meeting caloric needs due to AMS. Overall, pt appears profoundly weak and altered and is hot to the touch. SLP notified RN. Recommend pt consume pureed solids and thin liquids only when alert. Suction should be in the  room. May need consideration for temporary alternative means of nutrition if intake and arousal do nit improve. Will follow for tolerance and upgrade when possible.  Impact on safety and function Moderate aspiration risk;Risk for inadequate nutrition/hydration   CHL IP TREATMENT RECOMMENDATION 11/07/2015 Treatment Recommendations Therapy as outlined in treatment plan below   Prognosis 11/10/2015 Prognosis for Safe Diet Advancement Fair Barriers to Reach Goals Cognitive deficits Barriers/Prognosis Comment -- CHL IP DIET RECOMMENDATION 11/10/2015 SLP Diet Recommendations Dysphagia 1 (Puree) solids;Thin liquid Liquid Administration via Cup;Straw Medication Administration Whole meds with puree Compensations Slow rate;Small sips/bites Postural Changes Seated upright at 90 degrees;Remain semi-upright after after feeds/meals (Comment)   CHL IP OTHER RECOMMENDATIONS 11/10/2015 Recommended Consults -- Oral Care Recommendations -- Other Recommendations Have oral suction available   CHL IP FOLLOW UP RECOMMENDATIONS 11/10/2015 Follow up Recommendations Inpatient Rehab   CHL IP FREQUENCY AND DURATION 11/10/2015 Speech Therapy Frequency (ACUTE ONLY) min 2x/week Treatment Duration 2 weeks      CHL IP ORAL PHASE 11/10/2015 Oral Phase Impaired Oral - Pudding Teaspoon -- Oral - Pudding Cup -- Oral - Honey Teaspoon -- Oral - Honey Cup -- Oral - Nectar Teaspoon -- Oral - Nectar Cup -- Oral - Nectar Straw -- Oral - Thin Teaspoon -- Oral - Thin Cup Holding of bolus;Lingual pumping;Piecemeal swallowing;Delayed oral transit Oral - Thin Straw Holding of bolus;Lingual pumping;Piecemeal swallowing;Delayed oral transit Oral - Puree Holding of bolus;Lingual pumping;Piecemeal swallowing;Delayed oral transit Oral - Mech Soft -- Oral - Regular -- Oral - Multi-Consistency -- Oral - Pill -- Oral Phase - Comment --  CHL IP PHARYNGEAL PHASE 11/10/2015 Pharyngeal Phase WFL Pharyngeal- Pudding Teaspoon -- Pharyngeal -- Pharyngeal- Pudding Cup -- Pharyngeal --  Pharyngeal- Honey Teaspoon -- Pharyngeal -- Pharyngeal- Honey Cup -- Pharyngeal -- Pharyngeal- Nectar Teaspoon -- Pharyngeal -- Pharyngeal- Nectar Cup -- Pharyngeal -- Pharyngeal- Nectar Straw -- Pharyngeal -- Pharyngeal- Thin Teaspoon -- Pharyngeal -- Pharyngeal- Thin Cup -- Pharyngeal -- Pharyngeal- Thin Straw -- Pharyngeal -- Pharyngeal- Puree -- Pharyngeal -- Pharyngeal- Mechanical Soft -- Pharyngeal -- Pharyngeal- Regular -- Pharyngeal -- Pharyngeal- Multi-consistency -- Pharyngeal -- Pharyngeal- Pill -- Pharyngeal -- Pharyngeal Comment --  CHL IP CERVICAL ESOPHAGEAL PHASE 11/10/2015 Cervical Esophageal Phase WFL Pudding Teaspoon -- Pudding Cup -- Honey Teaspoon -- Honey Cup -- Nectar Teaspoon -- Nectar Cup -- Nectar Straw -- Thin Teaspoon -- Thin Cup -- Thin Straw -- Puree -- Mechanical Soft -- Regular -- Multi-consistency -- Pill -- Cervical Esophageal Comment -- No flowsheet data found. Harlon Ditty, MA CCC-SLP [phone] Claudine Mouton 11/10/2015, 2:08 PM                Assessment/Plan: AIDS Started on DTGV/Desc Partner testing done 2 weeks ago. Suggested that this is repeated I offered to help her disclose to her husband.    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

## 2015-11-13 LAB — CBC
HCT: 24.6 % — ABNORMAL LOW (ref 36.0–46.0)
Hemoglobin: 8.1 g/dL — ABNORMAL LOW (ref 12.0–15.0)
MCH: 29.6 pg (ref 26.0–34.0)
MCHC: 32.9 g/dL (ref 30.0–36.0)
MCV: 89.8 fL (ref 78.0–100.0)
PLATELETS: 102 10*3/uL — AB (ref 150–400)
RBC: 2.74 MIL/uL — ABNORMAL LOW (ref 3.87–5.11)
RDW: 18.4 % — AB (ref 11.5–15.5)
WBC: 1.2 10*3/uL — CL (ref 4.0–10.5)

## 2015-11-13 LAB — BASIC METABOLIC PANEL
Anion gap: 4 — ABNORMAL LOW (ref 5–15)
BUN: 9 mg/dL (ref 6–20)
CALCIUM: 7.7 mg/dL — AB (ref 8.9–10.3)
CO2: 27 mmol/L (ref 22–32)
Chloride: 97 mmol/L — ABNORMAL LOW (ref 101–111)
Creatinine, Ser: 0.61 mg/dL (ref 0.44–1.00)
GFR calc Af Amer: >60 mL/min (ref >60)
GLUCOSE: 109 mg/dL — AB (ref 65–99)
Potassium: 3.6 mmol/L (ref 3.5–5.1)
Sodium: 128 mmol/L — ABNORMAL LOW (ref 135–145)

## 2015-11-13 NOTE — PMR Pre-admission (Signed)
PMR Admission Coordinator Pre-Admission Assessment  Patient: Samantha Kline is an 55 y.o., female MRN: [id number] DOB: [birth year] Height:  (162.6 cm) Weight: 60.6 kg (133 lb 11.2 oz)              Insurance Information HMO:     PPO:      PCP:      IPA:      80/20:      OTHER: open access plus PRIMARY: [employer]      Policy#: [id number]      Subscriber: spouse CM Name: Okey Regal for [name]      Phone#: [phone]  Fax#: [phone] Pre-Cert#: [id number] with updates due on 12/01/15       Employer: spouse approved for 7 days Benefits:  Phone #: [phone]     Name: 11/11/15 Eff. Date: 04/23/10     Deduct: $500      Out of Pocket Max: $5000      Life Max: none CIR: $300 copay per admission then covers 80%      SNF: 80% 100 days Outpatient: $50 copay per admit     Co-Pay: 60 visits combined Home Health: 80%      Co-Pay: 60 visits combined DME: 80%     Co-Pay: 20% Providers: in network  SECONDARY: none        Medicaid Application Date:       Case Manager:  Disability Application Date:       Case Worker:   Emergency Contact Information Contact Information    Name Relation Home Work Mobile   Olancha Spouse [phone]  [phone]   Jewelle, Whitner   [phone]     Current Medical History  Patient Admitting Diagnosis: Debility  History of Present Illness: A 55 y.o.femalewith unintentional 50 lbs weight loss over past few months, anorexia, SOB with progressive weakness. She was admitted 07/03-7/10/17 with fever due to PCP PNA and diagnosed with HIV. She was discharged to home on antiviral therapy but continued to have malaise, N/V with poor oral intake and inability to walk. She was readmitted on 07/17 with lethargy, confusion and weakness due to hyponatremia with Na- 110. She was treated with fluid boluses and kayexalate for management of hyperkalemia. CT head negative for infection or acute changes. Mentation improving and she has had improvement in  electrolyte abnormality but decrease in ability to eat due to dysphagia. She was started on IV flucanazole for candida esophagitis.  She spiked temp of 103.4 on 07/29 and was started on empiric treatment for MAC with resolution of low grade fevers.  She has had decrease in viral load from 200K to 200 with increase in white count and concerns about risk of IRIS.  She underwent endoscopy (Dr. Ewing Schlein)  with biopsies to rule out CMV/HSV on 8/02 due to continued odynophagia. EGD showed ulcers and high CMV viral load.   Pancytopenia treated Granix and she was started on Gancyclovir due to high likelihood of CMV esophagitis. She has had improvement in WBC from 1.6-->10.3 and platelets from 115-->195.  She continues to have poor po intake but has refused feeding tube or PEG.  Hyponatremia improving with electrolyte abnormalities being treated as needed. She continues to be  Debilitated but showing increase in voice volume and stabilization of medical status.  Therapy ongoing and CIR recommended for follow up therapy.     Past Medical History  Past Medical History:  Diagnosis Date  . Anemia   . HIV (human immunodeficiency virus infection) (HCC)  11/06/15 Family states it was just diagnosed this week  . Pneumonia 10/24/2015  . Vitamin D deficiency     Family History  family history includes Diabetes in her father; Heart attack in her brother; Hypertension in her sister.  Prior Rehab/Hospitalizations:  Has the patient had major surgery during 100 days prior to admission? No  Current Medications   Current Facility-Administered Medications:  .  0.9 %  sodium chloride infusion, , Intravenous, Continuous, Vida Rigger, MD, Last Rate: 20 mL/hr at 11/23/15 1719 .  acetaminophen (TYLENOL) tablet 650 mg, 650 mg, Oral, Q4H PRN, 650 mg at 11/20/15 1346 **OR** acetaminophen (TYLENOL) suppository 650 mg, 650 mg, Rectal, Q4H PRN, Jinger Neighbors, NP .  albuterol (PROVENTIL) (2.5 MG/3ML) 0.083% nebulizer solution 2.5 mg,  2.5 mg, Nebulization, Q6H PRN, Lorretta Harp, MD .  amitriptyline (ELAVIL) tablet 10 mg, 10 mg, Oral, QHS, Belkys A Regalado, MD, 10 mg at 11/24/15 2231 .  antiseptic oral rinse (CPC / CETYLPYRIDINIUM CHLORIDE 0.05%) solution 7 mL, 7 mL, Mouth Rinse, BID, Alison Murray, MD, 7 mL at 11/25/15 1000 .  clarithromycin (BIAXIN) tablet 500 mg, 500 mg, Oral, Q12H, Gardiner Barefoot, MD, 500 mg at 11/25/15 0901 .  dapsone tablet 100 mg, 100 mg, Oral, Daily, Randall Hiss, MD .  dextromethorphan-guaiFENesin [facility] DM) 30-600 MG per 12 hr tablet 1 tablet, 1 tablet, Oral, BID PRN, Lorretta Harp, MD .  dextrose 5 %-0.9 % sodium chloride infusion, , Intravenous, Continuous, Alison Murray, MD, Last Rate: 75 mL/hr at 11/24/15 1837 .  dolutegravir (TIVICAY) tablet 50 mg, 50 mg, Oral, Q1200, Lorretta Harp, MD, 50 mg at 11/24/15 1200 .  dronabinol (MARINOL) capsule 2.5 mg, 2.5 mg, Oral, BID AC, Gardiner Barefoot, MD, 2.5 mg at 11/24/15 1835 .  emtricitabine-tenofovir AF (DESCOVY) 200-25 MG per tablet 1 tablet, 1 tablet, Oral, Q1200, Lorretta Harp, MD, 1 tablet at 11/24/15 1200 .  ethambutol (MYAMBUTOL) tablet 900 mg, 900 mg, Oral, Daily, Vassie Loll, MD, 900 mg at 11/25/15 0901 .  feeding supplement (ENSURE ENLIVE) (ENSURE ENLIVE) liquid 237 mL, 237 mL, Oral, QID, Vassie Loll, MD, 237 mL at 11/25/15 0903 .  ferrous sulfate tablet 325 mg, 325 mg, Oral, Q breakfast, Lorretta Harp, MD, 325 mg at 11/25/15 0902 .  fluconazole (DIFLUCAN) IVPB 400 mg, 400 mg, Intravenous, Q24H, Gardiner Barefoot, MD, 400 mg at 11/24/15 1800 .  ganciclovir (CYTOVENE) 150 mg in sodium chloride 0.9 % 100 mL IVPB, 2.5 mg/kg, Intravenous, Q12H, Randall Hiss, MD, 150 mg at 11/25/15 [phone] .  metoprolol (LOPRESSOR) injection 5 mg, 5 mg, Intravenous, Daily PRN, Alison Murray, MD, 5 mg at 11/16/15 0007 .  multivitamin with minerals tablet 1 tablet, 1 tablet, Oral, Daily, Alison Murray, MD, 1 tablet at 11/25/15 0902 .  ondansetron (ZOFRAN) injection 4 mg, 4 mg,  Intravenous, Q8H PRN, Lorretta Harp, MD, 4 mg at 11/18/15 2347 .  prochlorperazine (COMPAZINE) injection 10 mg, 10 mg, Intravenous, Q6H PRN, Vassie Loll, MD .  sodium chloride flush (NS) 0.9 % injection 3 mL, 3 mL, Intravenous, Q12H, Lorretta Harp, MD, 3 mL at 11/25/15 1000 .  vitamin B-12 (CYANOCOBALAMIN) tablet 100 mcg, 100 mcg, Oral, Daily, Lorretta Harp, MD, 100 mcg at 11/25/15 0901 .  Vitamin D (Ergocalciferol) (DRISDOL) capsule 50,000 Units, 50,000 Units, Oral, Q Fri, Xilin Niu, MD, 50,000 Units at 11/25/15 0901  Patients Current Diet: DIET SOFT Room service appropriate? Yes; Fluid consistency: Thin Diet - low sodium heart  healthy  Precautions / Restrictions Precautions Precautions: Fall Precaution Comments: monitor O2 & HR Restrictions Weight Bearing Restrictions: No   Has the patient had 2 or more falls or a fall with injury in the past year?No  Prior Activity Level Limited Community (1-2x/wk): gradual decline in function over past 6 months  Journalist, newspaper / Equipment Home Assistive Devices/Equipment: None Home Equipment: Bedside commode, Walker - 2 wheels  Prior Device Use: Indicate devices/aids used by the patient prior to current illness, exacerbation or injury? Walker  Prior Functional Level Prior Function Level of Independence: Needs assistance Gait / Transfers Assistance Needed: walked 100 ft minguard with RW on recent admission; at home walked with minguard assist, no device, no furniture walking ADL's / Homemaking Assistance Needed: Pt indicates spouse was assisting her with ADLs PTA  Comments: spouse had taken off of work since last hospital d/c to provide care  Self Care: Did the patient need help bathing, dressing, using the toilet or eating?  Needed some help  Indoor Mobility: Did the patient need assistance with walking from room to room (with or without device)? Needed some help  Stairs: Did the patient need assistance with internal or external stairs (with  or without device)? Needed some help  Functional Cognition: Did the patient need help planning regular tasks such as shopping or remembering to take medications? Needed some help  Current Functional Level Cognition  Overall Cognitive Status: Impaired/Different from baseline Difficult to assess due to: Level of arousal Current Attention Level: Selective Orientation Level: Oriented X4 Following Commands: Follows one step commands with increased time, Follows one step commands consistently Safety/Judgement: Decreased awareness of safety General Comments: Increased time requried for processing     Extremity Assessment (includes Sensation/Coordination)  Upper Extremity Assessment: Generalized weakness  Lower Extremity Assessment: Defer to PT evaluation RLE Deficits / Details: AROM WFL; knee extension 3+ LLE Deficits / Details: AROM WFL; knee extension 3+    ADLs  Overall ADL's : Needs assistance/impaired Eating/Feeding: Supervision/ safety Eating/Feeding Details (indicate cue type and reason): Pt has difficulty bringing food to mouth due to weakness Grooming: Minimal assistance, Oral care, Wash/dry face, Sitting Grooming Details (indicate cue type and reason): Assist for cap back on toothpaste and set up for all supplies. Pt quick to fatigue. Upper Body Bathing: Sitting, Total assistance Upper Body Bathing Details (indicate cue type and reason): max assist to maintain dynamic sitting EOB Lower Body Bathing: Maximal assistance, +2 for physical assistance, Bed level Upper Body Dressing : Total assistance, Sitting Upper Body Dressing Details (indicate cue type and reason): max assist to maintain dynamic sitting EOB  Lower Body Dressing: Maximal assistance, +2 for physical assistance, Bed level Toilet Transfer: Moderate assistance, +2 for physical assistance, +2 for safety/equipment Toilet Transfer Details (indicate cue type and reason): simulated EOB to recliner transfer  Toileting-  Clothing Manipulation and Hygiene: Total assistance, Sit to/from stand Toileting - Clothing Manipulation Details (indicate cue type and reason): Pt incontinenet if BM Functional mobility during ADLs: Maximal assistance General ADL Comments: Pt fatigues very quickly with functional activities. Pt noted to have flexed posture of wrists and digits; pt able to actively come out of posture with verbal cues. Encouraged proper positioning of bil UEs.     Mobility  Overal bed mobility: Needs Assistance Bed Mobility: Supine to Sit Rolling: Mod assist Sidelying to sit: Mod assist, +2 for physical assistance Supine to sit: Max assist, +2 for physical assistance Sit to supine: Total assist, +2 for physical assistance Sit to  sidelying: Max assist, +2 for physical assistance, +2 for safety/equipment General bed mobility comments: Pt OOB in chair upon arrival.    Transfers  Overall transfer level: Needs assistance Equipment used: Rolling walker (2 wheeled) Transfers: Sit to/from Stand, Stand Pivot Transfers Sit to Stand: +2 physical assistance, Min assist Stand pivot transfers: Mod assist, +2 safety/equipment General transfer comment: Not assessed at this time.    Ambulation / Gait / Stairs / Wheelchair Mobility  Ambulation/Gait Ambulation/Gait assistance: Min assist, Mod assist, +2 safety/equipment Ambulation Distance (Feet): 40 Feet Assistive device: Rolling walker (2 wheeled) Gait Pattern/deviations: Step-through pattern, Decreased stride length, Trunk flexed General Gait Details: deferred due to tachycardia and fatigue Gait velocity: very slow pattern    Posture / Balance Dynamic Sitting Balance Sitting balance - Comments: FAIR STATIC; POOR DYNAMIC Balance Overall balance assessment: Needs assistance (Simultaneous filing. User may not have seen previous data.) Sitting-balance support: Feet supported Sitting balance-Leahy Scale: Fair Sitting balance - Comments: FAIR STATIC; POOR  DYNAMIC Postural control: Posterior lean, Right lateral lean (maintains sitting with min guard; cannot accept ANYchallenge) Standing balance support: Bilateral upper extremity supported Standing balance-Leahy Scale: Poor Standing balance comment: HHA of 2    Special needs/care consideration BiPAP/CPAP no CPM no Continuous Drip IV no Dialysis no Life Vest no Oxygen: 02 2 L [place] Special Bed no Trach Size no Wound Vac (area) no Skin Sacral redness                               Bowel mgmt: Last BM 11/24/15,  incontinent Bladder mgmt: incontinent Diabetic mgmt no Spouse and pt aware of diagnosis only. Not other family members   Previous Home Environment Living Arrangements: Spouse/significant other  Lives With: Spouse Available Help at Discharge:  (spouse states he will talk to family about help providing 24) Type of Home: House Home Layout: Multi-level Alternate Level Stairs-Rails: Left Alternate Level Stairs-Number of Steps: 5 up and 5 down Home Access: Stairs to enter Entrance Stairs-Rails: None Entrance Stairs-Number of Steps: 2 Bathroom Shower/Tub: Associate Professor: Yes How Accessible: Accessible via walker Home Care Services: No  Discharge Living Setting Plans for Discharge Living Setting: Patient's home, Lives with (comment) (spouse) Type of Home at Discharge: House Discharge Home Layout: Multi-level Alternate Level Stairs-Rails: Left Alternate Level Stairs-Number of Steps: 5 up and 5 down Discharge Home Access: Stairs to enter Entrance Stairs-Rails: None Entrance Stairs-Number of Steps: 2 Discharge Bathroom Shower/Tub: Tub/shower unit, Curtain Discharge Bathroom Toilet: Standard Discharge Bathroom Accessibility: Yes How Accessible: Accessible via walker Does the patient have any problems obtaining your medications?: No  Social/Family/Support Systems Patient Roles: Spouse, Parent Contact Information: Nahdia Doucet,  spouse Anticipated Caregiver: spouse and his daughter in laws and sons Anticipated Caregiver's Contact Information: pt does not use his cell phone Ability/Limitations of Caregiver: spouse works 3 am until about 2 pm daily.  Caregiver Availability: 24/7 (spouse will arrange with family 24/7 care) Discharge Plan Discussed with Primary Caregiver: Yes Is Caregiver In Agreement with Plan?: Yes Does Caregiver/Family have Issues with Lodging/Transportation while Pt is in Rehab?: No (spouse stays in hospital with pt when he is not working)  Goals/Additional Needs Patient/Family Goal for Rehab: Mod I to supervision  Expected length of stay: 12- 16 days Dietary Needs: pt with very poor appetite Equipment Needs: yonker suction for phlegm prn Special Service Needs: Pt and spouse awar of new HIV status diagnosis, but other family members  have not been told officially Additional Information: pt really wants to go home asap Pt/Family Agrees to Admission and willing to participate: Yes Program Orientation Provided & Reviewed with Pt/Caregiver Including Roles  & Responsibilities: Yes  Decrease burden of Care through IP rehab admission: n/a  Possible need for SNF placement upon discharge:not anticipated  Patient Condition: This patient's medical and functional status has changed since the consult dated: 11/09/2015 in which the Rehabilitation Physician determined and documented that the patient's condition is appropriate for intensive rehabilitative care in an inpatient rehabilitation facility. See "History of Present Illness" (above) for medical update. Functional changes are: Currently requiring min assist for stand pivot transfers.  Cognition is impaired.  Patient's medical and functional status update has been discussed with the Rehabilitation physician and patient remains appropriate for inpatient rehabilitation. Will admit to inpatient rehab today.   Preadmission Screen Completed By:  Trish Mage,  11/25/2015 1:16 PM ______________________________________________________________________   Discussed status with Dr. Riley Kill on 11/25/15 at 1316 and received telephone approval for admission today.  Admission Coordinator:  Trish Mage, time1316/Date08/04/17

## 2015-11-13 NOTE — Progress Notes (Signed)
CRITICAL VALUE ALERT  Critical value received:  White count 1.2  Date of notification:  11/13/15  Time of notification:  0747  Critical value read back:Yes.    Nurse who received alert:  Claudis Giovanelli   MD notified (1st page):  Dr. Elisabeth Pigeon  Time of first page:  0801  MD notified (2nd page):  Time of second page:  Responding MD:    Time MD responded:

## 2015-11-13 NOTE — Progress Notes (Signed)
Patient ID: Samantha Kline, female   DOB: [birth year], 55 y.o.   MRN: [id number]  PROGRESS NOTE    Samantha Kline  WUJ:[id number] DOB: 15-Nov-1960 DOA: 11/06/2015  PCP: No PCP Per Patient   Brief Narrative:  55 y.o. female with past medical history significant for recently diagnosed HIV, recent hospitalization from 10/24/2015 through 10/31/2015 for new diagnosis of HIV pneumonia. Patient was subsequently discharged on Bactrim and prednisone with concerns for pneumocystis pneumonia. Apparently patient was doing well for couple of days after discharge but then started to decline with generalized weakness, loss of appetite, nausea, vomiting and confusion. Patient's family reported no blood with vomiting. No diarrhea. No fevers or chills.  In ED, patient was hemodynamically stable. Blood work was notable for sodium of 110, potassium 5.3, bicarbonate 18, glucose 161. Lactic acid was 2.65 but subsequently normalized. CT head showed no acute intracranial process. Chest x-ray showed no acute abnormalities.   Assessment & Plan:  Acute metabolic encephalopathy / hypoosmolar hyponatremia - Likely secondary to severe hyponatremia. Sodium is low as 110 on the admission - Hyponatremia also likely because of Bactrim - No acute findings on CT head - Sodium 128 in pat 24 hours, stable   Fever - Ongoing fever despite multiple abx - Appreciate ID input - Perhaps IRIS versus from copious secretions form mouth due to oral thrush  - Most recent CXR showed low lung volumes and opacity at the left base   Oral thrush - Continue fluconazole, dose increased per ID to 400 mg instead of 100 mg daily  - Per SLP, meds with puree and thin liquids   HIV on HAART - Continue Tivicay and descovy - We stopped the Bactrim because of hyponatremia. She is still on prednisone.  - For PCP prophylaxis she is on dapsone, clindamycin and primaquine - Continue azithromycin for MAC prophylaxis  Anemia of chronic disease -  Hemoglobin down to 7.9, 8.1 - Repeat CBC in am  Hyperkalemia - Secondary to Bactrim - She was given Kayexalate  - Repeat potassium within normal limits  Moderate protein calorie malnutrition - In the context of chronic illness - Continue ensure supplementation   DVT prophylaxis: Lovenox subcutaneous Code Status: full code  Family Communication: husband at the bedside Disposition Plan: spiking fever, not stable for discharge    Consultants:   ID - phone call only   SLP  Procedures:   None   Antimicrobials:   HAART  Bactrim, stop 11/08/2015  Dapsone 11/08/2015 -->  Clindamycin 11/08/2015 -->  Primaquine 11/08/2015 -->  Fluconazole  Azithromycin    Subjective: No overnight events.  Objective: Vitals:   11/12/15 1333 11/12/15 2144 11/13/15 0705 11/13/15 1317  BP: (!) 115/56 127/75 (!) 142/73 126/67  Pulse: 100 87 98 (!) 122  Resp: 17 19 20  (!) 28  Temp: 98.5 F (36.9 C) 99.2 F (37.3 C) (!) 100.4 F (38 C) (!) 102.5 F (39.2 C)  TempSrc: Oral Oral Oral   SpO2: 100% 100% 100% 92%  Weight:      Height:       No intake or output data in the 24 hours ending 11/13/15 1502 Filed Weights   11/07/15 0900  Weight: 52.8 kg (116 lb 4.8 oz)    Examination:  General exam: no distress  Respiratory system: No wheezing, no rhonchi  Cardiovascular system: S1 & S2 heard, rate controlled  Gastrointestinal system: (+) BS, non tender  Central nervous system: Nonfocal  Extremities: No edema, palpable pulses  Skin: no ulcers, no lesions  Psychiatry: normal mood  Data Reviewed: I have personally reviewed following labs and imaging studies  CBC:  Recent Labs Lab 11/09/15 0314 11/10/15 0820 11/11/15 0551 11/12/15 0500 11/13/15 0503  WBC 3.1* 2.4* 2.0* 1.6* 1.2*  HGB 9.9* 8.9* 8.2* 7.9* 8.1*  HCT 27.3* 25.2* 23.5* 23.1* 24.6*  MCV 85.0 85.7 87.4 88.2 89.8  PLT 150 146* 133* 127* 102*   Basic Metabolic Panel:  Recent Labs Lab 11/09/15 0314  11/10/15 0820 11/11/15 0551 11/12/15 0500 11/13/15 0503  NA 120* 126* 129* 128* 128*  K 4.3 3.5 3.5 3.7 3.6  CL 94* 99* 99* 97* 97*  CO2 21* GLUCOSE 146* 118* 117* 161* 109*  BUN CREATININE 0.75 0.65 0.69 0.60 0.61  CALCIUM 8.0* 7.8* 7.6* 7.7* 7.7*   GFR: Estimated Creatinine Clearance: 65.5 mL/min (by C-G formula based on SCr of 0.8 mg/dL). Liver Function Tests:  Recent Labs Lab 11/06/15 2047  AST 36  ALT 27  ALKPHOS 76  BILITOT 0.6  PROT 8.1  ALBUMIN 2.9*    Recent Labs Lab 11/07/15 0256  LIPASE 22   No results for input(s): AMMONIA in the last 168 hours. Coagulation Profile:  Recent Labs Lab 11/07/15 0256  INR 1.74*   Cardiac Enzymes: No results for input(s): CKTOTAL, CKMB, CKMBINDEX, TROPONINI in the last 168 hours. BNP (last 3 results) No results for input(s): PROBNP in the last 8760 hours. HbA1C: No results for input(s): HGBA1C in the last 72 hours. CBG: No results for input(s): GLUCAP in the last 168 hours. Lipid Profile: No results for input(s): CHOL, HDL, LDLCALC, TRIG, CHOLHDL, LDLDIRECT in the last 72 hours. Thyroid Function Tests: No results for input(s): TSH, T4TOTAL, FREET4, T3FREE, THYROIDAB in the last 72 hours. Anemia Panel: No results for input(s): VITAMINB12, FOLATE, FERRITIN, TIBC, IRON, RETICCTPCT in the last 72 hours. Urine analysis:    Component Value Date/Time   COLORURINE YELLOW 11/06/2015 2236   APPEARANCEUR CLEAR 11/06/2015 2236   LABSPEC 1.029 11/06/2015 2236   PHURINE 6.0 11/06/2015 2236   GLUCOSEU NEGATIVE 11/06/2015 2236   HGBUR NEGATIVE 11/06/2015 2236   BILIRUBINUR NEGATIVE 11/06/2015 2236   KETONESUR NEGATIVE 11/06/2015 2236   PROTEINUR 30 (A) 11/06/2015 2236   NITRITE NEGATIVE 11/06/2015 2236   LEUKOCYTESUR NEGATIVE 11/06/2015 2236   Sepsis Labs: (procalcitonin:4,lacticidven:4)   Recent Results (from the past 240 hour(s))  Culture, blood (Routine x 2)     Status: None  (Preliminary result)   Collection Time: 11/06/15  8:30 PM  Result Value Ref Range Status   Specimen Description BLOOD LEFT ARM  Final   Special Requests IN PEDIATRIC BOTTLE 2CC  Final   Culture NO GROWTH 3 DAYS  Final   Report Status PENDING  Incomplete  Culture, blood (Routine x 2)     Status: None (Preliminary result)   Collection Time: 11/06/15  8:45 PM  Result Value Ref Range Status   Specimen Description BLOOD LEFT ARM  Final   Special Requests BOTTLES DRAWN AEROBIC AND ANAEROBIC 4CC   Final   Culture NO GROWTH 3 DAYS  Final   Report Status PENDING  Incomplete  Urine culture     Status: None   Collection Time: 11/06/15 10:36 PM  Result Value Ref Range Status   Specimen Description URINE, CATHETERIZED  Final   Special Requests NONE  Final   Culture NO GROWTH  Final   Report Status 11/08/2015 FINAL  Final  MRSA PCR Screening  Status: None   Collection Time: 11/09/15 11:54 AM  Result Value Ref Range Status   MRSA by PCR NEGATIVE NEGATIVE Final      Radiology Studies: Dg Chest Port 1 View 11/11/2015 Low lung volumes. Subtle opacity at the left lung base, suspicious for pneumonia. Aortic atherosclerosis.   Dg Chest 1 View 11/06/2015  No acute abnormalities.   Ct Head Wo Contrast 11/07/2015  No acute intracranial process.    Marland Kitchen antiseptic oral rinse  7 mL Mouth Rinse BID  . azithromycin  1,200 mg Oral Q Fri  . clindamycin  600 mg Oral Q8H   And  . primaquine  30 mg Oral Daily  . dolutegravir  50 mg Oral Q1200  . emtricitabine-tenofovir AF  1 tablet Oral Q1200  . enoxaparin (LOVENOX) injection  40 mg Subcutaneous Q24H  . feeding supplement (ENSURE ENLIVE)  237 mL Oral TID BM  . ferrous sulfate  325 mg Oral Q breakfast  . fluconazole (DIFLUCAN) IV  400 mg Intravenous Q24H  . predniSONE  20 mg Oral Q1200  . sodium chloride flush  3 mL Intravenous Q12H  . vitamin B-12  100 mcg Oral Daily  . Vitamin D (Ergocalciferol)  50,000 Units Oral Q Fri     Continuous  Infusions: . dextrose 5 % and 0.9% NaCl 75 mL/hr at 11/12/15 1907     LOS: 6 days    Time spent: 15 minutes  Greater than mental status sodium 50% of the time spent on counseling and coordinating the care.   Manson Passey, MD [facility] Pager [phone]  If 7PM-7AM, please contact night-coverage [email or web address] Password [email or web address] 11/13/2015, 3:02 PM

## 2015-11-13 NOTE — Progress Notes (Signed)
Patient did not wake up safely enough to take her cleomycin this am. Medication was wasted.

## 2015-11-14 LAB — BASIC METABOLIC PANEL
Anion gap: 6 (ref 5–15)
BUN: 10 mg/dL (ref 6–20)
CHLORIDE: 96 mmol/L — AB (ref 101–111)
CO2: 25 mmol/L (ref 22–32)
CREATININE: 0.56 mg/dL (ref 0.44–1.00)
Calcium: 7.6 mg/dL — ABNORMAL LOW (ref 8.9–10.3)
GFR calc Af Amer: >60 mL/min (ref >60)
GFR calc non Af Amer: >60 mL/min (ref >60)
Glucose, Bld: 120 mg/dL — ABNORMAL HIGH (ref 65–99)
Potassium: 3.5 mmol/L (ref 3.5–5.1)
SODIUM: 127 mmol/L — AB (ref 135–145)

## 2015-11-14 LAB — CBC
HCT: 25.6 % — ABNORMAL LOW (ref 36.0–46.0)
HEMOGLOBIN: 8.5 g/dL — AB (ref 12.0–15.0)
MCH: 29.9 pg (ref 26.0–34.0)
MCHC: 33.2 g/dL (ref 30.0–36.0)
MCV: 90.1 fL (ref 78.0–100.0)
Platelets: 92 10*3/uL — ABNORMAL LOW (ref 150–400)
RBC: 2.84 MIL/uL — ABNORMAL LOW (ref 3.87–5.11)
RDW: 18.6 % — AB (ref 11.5–15.5)
WBC: 1.3 10*3/uL — CL (ref 4.0–10.5)

## 2015-11-14 MED ORDER — ADULT MULTIVITAMIN W/MINERALS CH
1.0000 | ORAL_TABLET | Freq: Every day | ORAL | Status: DC
  Administered 2015-11-15 – 2015-11-25 (×8): 1 via ORAL
  Filled 2015-11-14 (×9): qty 1

## 2015-11-14 NOTE — Progress Notes (Signed)
Speech Language Pathology Treatment: Dysphagia  Patient Details Name: Samantha Kline MRN: [id number] DOB: [birth year] Today's Date: 11/14/2015 Time: 1173-5670 SLP Time Calculation (min) (ACUTE ONLY): 15 min  Assessment / Plan / Recommendation Clinical Impression  Dysphagia treatment provided today to check diet tolerance/ provide advanced PO trials. Upon entering room, pt alert and upright in chair, answering questions appropriately. Tolerated thin liquids and puree consistency with no overt s/s of aspiration. Pt able to feed self today without difficulty, observed to take small bites/ sips at a time. Provided trial of regular solid after which pt had a cough x1. Husband at bedside inquired about advancing diet. Given improvement in mental status, recommend advancing diet to dysphagia 2 consistency, thin liquids, meds crushed in puree. Will continue to follow for diet tolerance/ consider advancement.    HPI HPI: Samantha Kline is a 55 y.o. female with medical history significant of recently diagnosed HIV, recently treated pneumonia, who presents with altered mental status, generalized weakness, nausea and vomiting.Head CT and CXR negative. 50 lb unintentional weight loss over the past 5 - 6 months along with decreased appetite.  Pt underwent MBS yesterday and tody is seen for tolerance, readiness for advancement.       SLP Plan  Continue with current plan of care     Recommendations  Diet recommendations: Dysphagia 2 (fine chop);Thin liquid Liquids provided via: Cup;Straw Medication Administration: Whole meds with puree Supervision: Full supervision/cueing for compensatory strategies Compensations: Slow rate;Small sips/bites;Follow solids with liquid Postural Changes and/or Swallow Maneuvers: Seated upright 90 degrees;Upright 30-60 min after meal             Oral Care Recommendations: Oral care BID Follow up Recommendations: Inpatient Rehab Plan: Continue with current plan of care      GO                Metro Kung, MA, CCC-SLP 11/14/2015, 12:50 PM [phone]

## 2015-11-14 NOTE — Progress Notes (Signed)
Inpatient Rehabilitation  I met with the patient and her husband at the bedside to discuss their decision regarding IP Rehab.  They state they have discussed over the weekend and agree that pt. prefers to come to IP rehab when medically ready and can tolerate.  Pt. not medically ready today.  Admissions Coordinator to follow along for medical readiness, activity tolerance and bed availability.  Please call if questions.  [facility] Admissions Coordinator Cell [phone] Office [phone]

## 2015-11-14 NOTE — Progress Notes (Signed)
[facility]   Reason for visit: Follow up on HIV, fever  Interval History: agreed to rehab; opening mouth better  Physical Exam: Constitutional:  Vitals:   11/14/15 0441 11/14/15 1450  BP: 136/82 (!) 106/58  Pulse: 83 (!) 106  Resp: 18 19  Temp: 98.4 F (36.9 C) 98.4 F (36.9 C)   patient appears in NAD HENT: opens mouth more, no thrush noted Respiratory: Normal respiratory effort; CTA B Cardiovascular: RRR  Review of Systems: Constitutional: positive for anorexia or negative for chills Gastrointestinal: negative for diarrhea  Lab Results  Component Value Date   WBC 1.3 (LL) 11/14/2015   HGB 8.5 (L) 11/14/2015   HCT 25.6 (L) 11/14/2015   MCV 90.1 11/14/2015   PLT 92 (L) 11/14/2015    Lab Results  Component Value Date   CREATININE 0.56 11/14/2015   BUN 10 11/14/2015   NA 127 (L) 11/14/2015   K 3.5 11/14/2015   CL 96 (L) 11/14/2015   CO2 25 11/14/2015    Lab Results  Component Value Date   ALT 27 11/06/2015   AST 36 11/06/2015   ALKPHOS 76 11/06/2015     Microbiology: Recent Results (from the past 240 hour(s))  Culture, blood (Routine x 2)     Status: None   Collection Time: 11/06/15  8:30 PM  Result Value Ref Range Status   Specimen Description BLOOD LEFT ARM  Final   Special Requests IN PEDIATRIC BOTTLE 2CC  Final   Culture NO GROWTH 5 DAYS  Final   Report Status 11/11/2015 FINAL  Final  Culture, blood (Routine x 2)     Status: None   Collection Time: 11/06/15  8:45 PM  Result Value Ref Range Status   Specimen Description BLOOD LEFT ARM  Final   Special Requests BOTTLES DRAWN AEROBIC AND ANAEROBIC 4CC   Final   Culture NO GROWTH 5 DAYS  Final   Report Status 11/11/2015 FINAL  Final  Urine culture     Status: None   Collection Time: 11/06/15 10:36 PM  Result Value Ref Range Status   Specimen Description URINE, CATHETERIZED  Final   Special Requests NONE  Final   Culture NO GROWTH  Final   Report Status 11/08/2015  FINAL  Final  MRSA PCR Screening     Status: None   Collection Time: 11/09/15 11:54 AM  Result Value Ref Range Status   MRSA by PCR NEGATIVE NEGATIVE Final    Comment:        The GeneXpert MRSA Assay (FDA approved for NASAL specimens only), is one component of a comprehensive MRSA colonization surveillance program. It is not intended to diagnose MRSA infection nor to guide or monitor treatment for MRSA infections.   Culture, blood (routine x 2)     Status: None (Preliminary result)   Collection Time: 11/11/15  4:50 PM  Result Value Ref Range Status   Specimen Description BLOOD LEFT ANTECUBITAL  Final   Special Requests BOTTLES DRAWN AEROBIC ONLY 5CC  Final   Culture NO GROWTH 3 DAYS  Final   Report Status PENDING  Incomplete  Culture, blood (routine x 2)     Status: None (Preliminary result)   Collection Time: 11/11/15  4:55 PM  Result Value Ref Range Status   Specimen Description BLOOD BLOOD LEFT ARM  Final   Special Requests IN PEDIATRIC BOTTLE 1CC  Final   Culture NO GROWTH 3 DAYS  Final   Report Status PENDING  Incomplete  Impression/Plan:  1. Dysphagia - seems to be improving with fluconazole 400 mg.  Will continue.  Can take it po but continuing IV for now with difficulty swallowing.  2. 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  3. 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 15 more days.  Also with steroids. 4. Fever - may be iris, vs esophageal thrush.

## 2015-11-14 NOTE — Progress Notes (Signed)
Occupational Therapy Treatment Patient Details Name: Samantha Kline MRN: [id number] DOB: [birth year] Today's Date: 11/14/2015    History of present illness 55 y.o. female with medical history significant of recently diagnosed HIV, recently treated pneumonia, who presents with altered mental status, generalized weakness, nausea and vomiting.Head CT and CXR negative. 50 lb unintentional weight loss over the past 5 - 6 months along with decreased appetite.    OT comments  Pt making significant improvements since evaluation. Pt required +2 Min A with transfers and mod A with ADL. HR 140s with stand pivot transfer. 120s sitting in chair at end of session.  Pt unable to ambulate today due to fatigue. Flat affect. Continue to recommend CIR to maximize functional level of independence with ADL and mobility to facilitate safe D/C home.   Follow Up Recommendations  CIR;Supervision/Assistance - 24 hour    Equipment Recommendations  Wheelchair (measurements OT);Hospital bed    Recommendations for Other Services      Precautions / Restrictions Precautions Precautions: Fall Restrictions Weight Bearing Restrictions: No       Mobility Bed Mobility Overal bed mobility: Needs Assistance Bed Mobility: Supine to Sit;Rolling Rolling: Min assist Sidelying to sit: Min assist;HOB elevated       General bed mobility comments: cues for sequencing and hand over hand cues for use of bed rail; assist to elevate trunkn into sitting and scoot hips to EOB  Transfers Overall transfer level: Needs assistance Equipment used: 2 person hand held assist Transfers: Sit to/from [employer] Sit to Stand: Min assist;+2 safety/equipment Stand pivot transfers: Min assist;+2 safety/equipment       General transfer comment: Able to take several steps to chair    Balance     Sitting balance-Leahy Scale: Fair     Standing balance support: During functional activity Standing balance-Leahy Scale:  Poor                     ADL Overall ADL's : Needs assistance/impaired     Grooming: Minimal assistance   Upper Body Bathing: Minimal assitance   Lower Body Bathing: Moderate assistance;Sit to/from stand               Toileting- Architect and Hygiene: Total assistance;Sit to/from stand       Functional mobility during ADLs: Minimal assistance;+2 for physical assistance;Cueing for safety General ADL Comments: Pt limited by poor endurance. Able to sustain attention to task. Pt stated it was frustrating to have to help doing things that she should be aable to do.       Vision                     Perception     Praxis      Cognition   Behavior During Therapy: Flat affect Overall Cognitive Status: Within Functional Limits for tasks assessed                  General Comments: slow processing. Appears improved from last visit    Extremity/Trunk Assessment     generalized BUE weakness - difficulty bringing "yonker" to mouth. . Able to achieve @ 120 degrees B shoulder FF in bed with HOB elevated.           Exercises General Exercises - Upper Extremity Shoulder Flexion: AROM;Both;5 reps;Supine Elbow Flexion: AROM;Both;10 reps Elbow Extension: AROM;Both;10 reps;Seated Digit Composite Flexion: AROM;Both;10 reps Composite Extension: AROM;Both;10 reps   Shoulder Instructions       General Comments  Pertinent Vitals/ Pain       Pain Assessment: 0-10 Pain Score: 0-No pain Faces Pain Scale: No hurt  Home Living                                          Prior Functioning/Environment              Frequency Min 2X/week     Progress Toward Goals  OT Goals(current goals can now be found in the care plan section)  Progress towards OT goals: Progressing toward goals  Acute Rehab OT Goals Patient Stated Goal: to get stronger OT Goal Formulation: With patient Time For Goal Achievement:  11/22/15 Potential to Achieve Goals: Good ADL Goals Pt Will Perform Eating: with supervision;sitting Pt Will Perform Grooming: with supervision;with set-up;sitting Pt Will Perform Upper Body Bathing: with min assist;sitting Pt Will Perform Lower Body Bathing: with set-up;with supervision;sit to/from stand Pt Will Transfer to Toilet: with mod assist;stand pivot transfer;bedside commode Pt Will Perform Toileting - Clothing Manipulation and hygiene: with min guard assist;sit to/from stand Additional ADL Goal #1: Pt will sustain attention to familiar ADL task x 4 mins with min cues  Additional ADL Goal #2: Pt and family will be aware of energy conservation stratigies from handout that would be of benefit to her  Plan Discharge plan remains appropriate    Co-evaluation    PT/OT/SLP Co-Evaluation/Treatment: Yes Reason for Co-Treatment: Complexity of the patient's impairments (multi-system involvement);For patient/therapist safety PT goals addressed during session: Mobility/safety with mobility OT goals addressed during session: ADL's and self-care      End of Session Equipment Utilized During Treatment: Gait belt   Activity Tolerance Patient tolerated treatment well   Patient Left in chair;with call bell/phone within reach;with chair alarm set;with family/visitor present   Nurse Communication Mobility status        Time: 1011-1041 OT Time Calculation (min): 30 min  Charges: OT General Charges $OT Visit: 1 Procedure OT Treatments $Self Care/Home Management : 8-22 mins  Lovene Maret,HILLARY 11/14/2015, 2:58 PM   [facility], OT/L  [phone] 11/14/2015

## 2015-11-14 NOTE — Progress Notes (Signed)
Physical Therapy Treatment Patient Details Name: Samantha Kline MRN: [id number] DOB: 10-Jun-1960 Today's Date: 11/14/2015    History of Present Illness 55 y.o. female with medical history significant of recently diagnosed HIV, recently treated pneumonia, who presents with altered mental status, generalized weakness, nausea and vomiting.Head CT and CXR negative. 50 lb unintentional weight loss over the past 5 - 6 months along with decreased appetite.     PT Comments    Patient is gradually progressing toward mobility goals and appeared more alert this session. Pt continues to be limited by fatigue and tachycardia. HR in 140s with OOB mobility. Continue to progress as tolerated.   Follow Up Recommendations  CIR;Supervision/Assistance - 24 hour     Equipment Recommendations  Other (comment) (TBA if goes home)    Recommendations for Other Services       Precautions / Restrictions Precautions Precautions: Fall Restrictions Weight Bearing Restrictions: No    Mobility  Bed Mobility Overal bed mobility: Needs Assistance Bed Mobility: Supine to Sit;Rolling Rolling: Min assist Sidelying to sit: Min assist;HOB elevated       General bed mobility comments: cues for sequencing and hand over hand cues for use of bed rail; assist to elevate trunkn into sitting and scoot hips to EOB  Transfers Overall transfer level: Needs assistance Equipment used: 2 person hand held assist Transfers: Sit to/from [employer] Sit to Stand: Min assist;+2 safety/equipment Stand pivot transfers: Min assist;+2 safety/equipment       General transfer comment: assist to stand and maintain balance with pivotal steps; HR elevated to 145 with stand pivot and pt fatigued  Ambulation/Gait             General Gait Details: deferred due to tachycardia and fatigue   Stairs            Wheelchair Mobility    Modified Rankin (Stroke Patients Only)       Balance     Sitting  balance-Leahy Scale: Fair     Standing balance support: During functional activity Standing balance-Leahy Scale: Poor                      Cognition Arousal/Alertness: Awake/alert (slow processing, but alert) Behavior During Therapy: Flat affect Overall Cognitive Status: Within Functional Limits for tasks assessed                      Exercises      General Comments General comments (skin integrity, edema, etc.): HR 110s prior to mobility and 140s with OOB mobility      Pertinent Vitals/Pain Pain Assessment: Faces Faces Pain Scale: No hurt    Home Living                      Prior Function            PT Goals (current goals can now be found in the care plan section) Acute Rehab PT Goals Patient Stated Goal: none stated Time For Goal Achievement: 11/21/15 Progress towards PT goals: Progressing toward goals    Frequency  Min 3X/week    PT Plan Current plan remains appropriate    Co-evaluation PT/OT/SLP Co-Evaluation/Treatment: Yes Reason for Co-Treatment: Complexity of the patient's impairments (multi-system involvement);For patient/therapist safety PT goals addressed during session: Mobility/safety with mobility       End of Session Equipment Utilized During Treatment: Gait belt Activity Tolerance: Patient limited by fatigue;Treatment limited secondary to medical complications (Comment) (tachycardia) Patient  left: with call bell/phone within reach;in bed;with nursing/sitter in room;with family/visitor present     Time: 9794-8016 PT Time Calculation (min) (ACUTE ONLY): 30 min  Charges:  $Therapeutic Activity: 8-22 mins                    G Codes:      Derek Mound, PTA Pager: [phone]   11/14/2015, 2:47 PM

## 2015-11-14 NOTE — Progress Notes (Addendum)
Patient ID: Samantha Kline, female   DOB: 04/21/1961, 55 y.o.   MRN: [id number]  PROGRESS NOTE    Yitzel Shasteen  JYN:[id number] DOB: 01/08/1961 DOA: 11/06/2015  PCP: No PCP Per Patient   Brief Narrative:  55 y.o. female with past medical history significant for recently diagnosed HIV, recent hospitalization from 10/24/2015 through 10/31/2015 for new diagnosis of HIV pneumonia. Patient was subsequently discharged on Bactrim and prednisone with concerns for pneumocystis pneumonia. Apparently patient was doing well for couple of days after discharge but then started to decline with generalized weakness, loss of appetite, nausea, vomiting and confusion. Patient's family reported no blood with vomiting. No diarrhea. No fevers or chills.  In ED, patient was hemodynamically stable. Blood work was notable for sodium of 110, potassium 5.3, bicarbonate 18, glucose 161. Lactic acid was 2.65 but subsequently normalized. CT head showed no acute intracranial process. Chest x-ray showed no acute abnormalities.   Assessment & Plan:  Acute metabolic encephalopathy / hypoosmolar hyponatremia - Likely secondary to severe hyponatremia. Sodium is low as 110 on the admission - Hyponatremia also likely because of Bactrim - No acute findings on CT head - Sodium 127-128  Fever of unknown origin - Ongoing fever despite multiple abx. Possible PCP infection, IRIS or from heavy mucous secretions - Blood cultures on admission and repeat culture negative - Obtain CT chest for further evaluation - Most recent CXR showed low lung volumes and opacity at the left base  - Appreciate ID input   Oral thrush - Continue fluconazole, dose increased per ID to 400 mg instead of 100 mg daily  - Per SLP, meds with puree and thin liquids   HIV on HAART / PCP infection  - Continue Tivicay and descovy - Stopped the Bactrim because of hyponatremia.   - For PCP prophylaxis she is on dapsone and for PCP infection she is on  prednisone, clindamycin and primaquine - Continue azithromycin for MAC prophylaxis  Anemia of chronic disease - Hemoglobin stable at 8.5  Leukopenia - Likely from HIV versus acute infection - Continue to monitor CBC  Hyperkalemia - Secondary to Bactrim - She was given Kayexalate  - Repeat potassium within normal limits  Moderate protein calorie malnutrition - In the context of chronic illness - Continue ensure supplementation   DVT prophylaxis: Lovenox subcutaneous Code Status: full code  Family Communication: husband at the bedside Disposition Plan: spiking fever, not stable for discharge    Consultants:   ID - phone call only   SLP  Procedures:   None   Antimicrobials:   HAART  Bactrim, stop 11/08/2015  Dapsone 11/08/2015 -->  Clindamycin 11/08/2015 -->  Primaquine 11/08/2015 -->  Fluconazole  Azithromycin    Subjective: No overnight events.  Objective: Vitals:   11/13/15 1317 11/13/15 1750 11/13/15 2119 11/14/15 0441  BP: 126/67  124/67 136/82  Pulse: (!) 122  93 83  Resp: (!) Temp: (!) 102.5 F (39.2 C) 98.2 F (36.8 C) 98.1 F (36.7 C) 98.4 F (36.9 C)  TempSrc:  Oral  Oral  SpO2: 92%  97% 100%  Weight:      Height:        Intake/Output Summary (Last 24 hours) at 11/14/15 1210 Last data filed at 11/14/15 0750  Gross per 24 hour  Intake              900 ml  Output  0 ml  Net              900 ml   Filed Weights   11/07/15 0900  Weight: 52.8 kg (116 lb 4.8 oz)    Examination:  General exam: oral thrush, no distress  Respiratory system: No wheezing, diminished breath sounds  Cardiovascular system: S1 & S2 heard, tachycardic  Gastrointestinal system: (+) BS, non distended, non tender  Central nervous system: No focal deficits  Extremities: No edema, appreciate pulses  Skin: no ulcers, no lesions  Psychiatry: normal mood, no agitation.   Data Reviewed: I have personally reviewed following labs and  imaging studies  CBC:  Recent Labs Lab 11/10/15 0820 11/11/15 0551 11/12/15 0500 11/13/15 0503 11/14/15 0547  WBC 2.4* 2.0* 1.6* 1.2* 1.3*  HGB 8.9* 8.2* 7.9* 8.1* 8.5*  HCT 25.2* 23.5* 23.1* 24.6* 25.6*  MCV 85.7 87.4 88.2 89.8 90.1  PLT 146* 133* 127* 102* 92*   Basic Metabolic Panel:  Recent Labs Lab 11/10/15 0820 11/11/15 0551 11/12/15 0500 11/13/15 0503 11/14/15 0547  NA 126* 129* 128* 128* 127*  K 3.5 3.5 3.7 3.6 3.5  CL 99* 99* 97* 97* 96*  CO2 22 26 27 27 25   GLUCOSE 118* 117* 161* 109* 120*  BUN 9 12 11 9 10   CREATININE 0.65 0.69 0.60 0.61 0.56  CALCIUM 7.8* 7.6* 7.7* 7.7* 7.6*   GFR: Estimated Creatinine Clearance: 65.5 mL/min (by C-G formula based on SCr of 0.8 mg/dL). Liver Function Tests: No results for input(s): AST, ALT, ALKPHOS, BILITOT, PROT, ALBUMIN in the last 168 hours. No results for input(s): LIPASE, AMYLASE in the last 168 hours. No results for input(s): AMMONIA in the last 168 hours. Coagulation Profile: No results for input(s): INR, PROTIME in the last 168 hours. Cardiac Enzymes: No results for input(s): CKTOTAL, CKMB, CKMBINDEX, TROPONINI in the last 168 hours. BNP (last 3 results) No results for input(s): PROBNP in the last 8760 hours. HbA1C: No results for input(s): HGBA1C in the last 72 hours. CBG: No results for input(s): GLUCAP in the last 168 hours. Lipid Profile: No results for input(s): CHOL, HDL, LDLCALC, TRIG, CHOLHDL, LDLDIRECT in the last 72 hours. Thyroid Function Tests: No results for input(s): TSH, T4TOTAL, FREET4, T3FREE, THYROIDAB in the last 72 hours. Anemia Panel: No results for input(s): VITAMINB12, FOLATE, FERRITIN, TIBC, IRON, RETICCTPCT in the last 72 hours. Urine analysis:    Component Value Date/Time   COLORURINE YELLOW 11/06/2015 2236   APPEARANCEUR CLEAR 11/06/2015 2236   LABSPEC 1.029 11/06/2015 2236   PHURINE 6.0 11/06/2015 2236   GLUCOSEU NEGATIVE 11/06/2015 2236   HGBUR NEGATIVE 11/06/2015 2236     BILIRUBINUR NEGATIVE 11/06/2015 2236   KETONESUR NEGATIVE 11/06/2015 2236   PROTEINUR 30 (A) 11/06/2015 2236   NITRITE NEGATIVE 11/06/2015 2236   LEUKOCYTESUR NEGATIVE 11/06/2015 2236   Sepsis Labs: @LABRCNTIP (procalcitonin:4,lacticidven:4)   Recent Results (from the past 240 hour(s))  Culture, blood (Routine x 2)     Status: None (Preliminary result)   Collection Time: 11/06/15  8:30 PM  Result Value Ref Range Status   Specimen Description BLOOD LEFT ARM  Final   Special Requests IN PEDIATRIC BOTTLE 2CC  Final   Culture NO GROWTH 3 DAYS  Final   Report Status PENDING  Incomplete  Culture, blood (Routine x 2)     Status: None (Preliminary result)   Collection Time: 11/06/15  8:45 PM  Result Value Ref Range Status   Specimen Description BLOOD LEFT ARM  Final  Special Requests BOTTLES DRAWN AEROBIC AND ANAEROBIC 4CC   Final   Culture NO GROWTH 3 DAYS  Final   Report Status PENDING  Incomplete  Urine culture     Status: None   Collection Time: 11/06/15 10:36 PM  Result Value Ref Range Status   Specimen Description URINE, CATHETERIZED  Final   Special Requests NONE  Final   Culture NO GROWTH  Final   Report Status 11/08/2015 FINAL  Final  MRSA PCR Screening     Status: None   Collection Time: 11/09/15 11:54 AM  Result Value Ref Range Status   MRSA by PCR NEGATIVE NEGATIVE Final      Radiology Studies: Dg Chest Port 1 View 11/11/2015 Low lung volumes. Subtle opacity at the left lung base, suspicious for pneumonia. Aortic atherosclerosis.   Dg Chest 1 View 11/06/2015  No acute abnormalities.   Ct Head Wo Contrast 11/07/2015  No acute intracranial process.    Marland Kitchen antiseptic oral rinse  7 mL Mouth Rinse BID  . azithromycin  1,200 mg Oral Q Fri  . clindamycin  600 mg Oral Q8H   And  . primaquine  30 mg Oral Daily  . dolutegravir  50 mg Oral Q1200  . emtricitabine-tenofovir AF  1 tablet Oral Q1200  . enoxaparin (LOVENOX) injection  40 mg Subcutaneous Q24H  . feeding  supplement (ENSURE ENLIVE)  237 mL Oral TID BM  . ferrous sulfate  325 mg Oral Q breakfast  . fluconazole (DIFLUCAN) IV  400 mg Intravenous Q24H  . predniSONE  20 mg Oral Q1200  . sodium chloride flush  3 mL Intravenous Q12H  . vitamin B-12  100 mcg Oral Daily  . Vitamin D (Ergocalciferol)  50,000 Units Oral Q Fri     Continuous Infusions: . dextrose 5 % and 0.9% NaCl 75 mL/hr at 11/13/15 2300     LOS: 7 days    Time spent: 25 minutes  Greater than mental status sodium 50% of the time spent on counseling and coordinating the care.   Manson Passey, MD [facility] Pager [phone]  If 7PM-7AM, please contact night-coverage [email or web address] Password [email or web address] 11/14/2015, 12:10 PM

## 2015-11-14 NOTE — Progress Notes (Signed)
Nutrition Follow-up  DOCUMENTATION CODES:   Non-severe (moderate) malnutrition in context of chronic illness  INTERVENTION:   -Continue Ensure Enlive po TID, each supplement provides 350 kcal and 20 grams of protein -MVI daily  NUTRITION DIAGNOSIS:   Malnutrition related to chronic illness as evidenced by moderate depletion of body fat, moderate depletions of muscle mass.  Ongoing  GOAL:   Patient will meet greater than or equal to 90% of their needs  Progressing  MONITOR:   Diet advancement, PO intake, Supplement acceptance, Labs, Weight trends, I & O's  REASON FOR ASSESSMENT:   Consult Assessment of nutrition requirement/status  ASSESSMENT:   Patient was recently hospitalized from 7/3-7/10 and had new diagnosis of HIV and pneumonia. She was started with anti-HIV medications. Patient was discharged on bactrim and prednisone with concerns of PCP. Per husband, pt was doing well intially, but 4 days ago she started to decline. Pt developed increased generalized weakness, loss of appetite, decreased oral intake, nauseas, vomiting and confusion. She vomited twice without blood in the vomitus today. Patient does not have abdominal pain or diarrhea. She was initially very confused with AMS, which improved with IV NS bolus, and became oriented x 3. She continues to have dry cough and mild shortness of breath, but no chest pain. No fever or chills. Patient denies symptoms of UTI or unilateral weakness. When I saw pt in ED, she is oriented x 3 and looks very weak, moves all extremities. No active nausea, vomiting, abdominal pain or chest pain.  Pt receiving nursing care at time of visit.   Pt underwent MBSS on 11/10/15 and advanced to a dysphagia 1 diet with thin liquids. Noted upgrade to dysphagia 2 diet consistency. Meal completion remains fair to poor; PO: 40-60%. Pt accepting Ensure supplements well.   CSW and CIR admissions team following. Plan to d/c to CIR vs SNF once medically  stable.   Labs reviewed: Na: 127 (on IV supplementation).   Diet Order:  DIET DYS 2 Room service appropriate? Yes; Fluid consistency: Thin  Skin:  Wound (see comment) (stage II sacrum, HIV lesions)  Last BM:  11/13/15  Height:   Ht Readings from Last 1 Encounters:  11/07/15 5\' 4"  (1.626 m)    Weight:   Wt Readings from Last 1 Encounters:  11/07/15 116 lb 4.8 oz (52.8 kg)    Ideal Body Weight:  54.5 kg  BMI:  Body mass index is 19.96 kg/m.  Estimated Nutritional Needs:   Kcal:  1600-1800  Protein:  80-95 grams  Fluid:  1.6-1.8 L  EDUCATION NEEDS:   No education needs identified at this time  Samantha Kline A. Samantha Kline, RD, LDN, CDE Pager: [phone] After hours Pager: [phone]

## 2015-11-15 ENCOUNTER — Inpatient Hospital Stay (HOSPITAL_COMMUNITY): Payer: Managed Care, Other (non HMO)

## 2015-11-15 DIAGNOSIS — R509 Fever, unspecified: Secondary | ICD-10-CM | POA: Insufficient documentation

## 2015-11-15 LAB — BASIC METABOLIC PANEL
Anion gap: 6 (ref 5–15)
BUN: 5 mg/dL — AB (ref 6–20)
CHLORIDE: 95 mmol/L — AB (ref 101–111)
CO2: 26 mmol/L (ref 22–32)
Calcium: 7.6 mg/dL — ABNORMAL LOW (ref 8.9–10.3)
Creatinine, Ser: 0.6 mg/dL (ref 0.44–1.00)
GFR calc Af Amer: >60 mL/min (ref >60)
GFR calc non Af Amer: >60 mL/min (ref >60)
Glucose, Bld: 112 mg/dL — ABNORMAL HIGH (ref 65–99)
POTASSIUM: 3.2 mmol/L — AB (ref 3.5–5.1)
SODIUM: 127 mmol/L — AB (ref 135–145)

## 2015-11-15 LAB — CBC WITH DIFFERENTIAL/PLATELET
BASOS PCT: 3 %
Basophils Absolute: 0 10*3/uL (ref 0.0–0.1)
EOS PCT: 4 %
Eosinophils Absolute: 0 10*3/uL (ref 0.0–0.7)
HEMATOCRIT: 23.9 % — AB (ref 36.0–46.0)
HEMOGLOBIN: 7.7 g/dL — AB (ref 12.0–15.0)
LYMPHS PCT: 22 %
Lymphs Abs: 0.2 10*3/uL — ABNORMAL LOW (ref 0.7–4.0)
MCH: 29.6 pg (ref 26.0–34.0)
MCHC: 32.2 g/dL (ref 30.0–36.0)
MCV: 91.9 fL (ref 78.0–100.0)
MONO ABS: 0.1 10*3/uL (ref 0.1–1.0)
MONOS PCT: 10 %
NEUTROS PCT: 61 %
Neutro Abs: 0.4 10*3/uL — ABNORMAL LOW (ref 1.7–7.7)
Platelets: 80 10*3/uL — ABNORMAL LOW (ref 150–400)
RBC: 2.6 MIL/uL — ABNORMAL LOW (ref 3.87–5.11)
RDW: 18.8 % — ABNORMAL HIGH (ref 11.5–15.5)
WBC: 0.7 10*3/uL — CL (ref 4.0–10.5)

## 2015-11-15 LAB — CBC
HEMATOCRIT: 23.9 % — AB (ref 36.0–46.0)
Hemoglobin: 8 g/dL — ABNORMAL LOW (ref 12.0–15.0)
MCH: 30.3 pg (ref 26.0–34.0)
MCHC: 33.5 g/dL (ref 30.0–36.0)
MCV: 90.5 fL (ref 78.0–100.0)
Platelets: 79 10*3/uL — ABNORMAL LOW (ref 150–400)
RBC: 2.64 MIL/uL — ABNORMAL LOW (ref 3.87–5.11)
RDW: 19 % — AB (ref 11.5–15.5)
WBC: 0.7 10*3/uL — AB (ref 4.0–10.5)

## 2015-11-15 LAB — ACID FAST SMEAR (AFB)

## 2015-11-15 LAB — ACID FAST SMEAR (AFB, MYCOBACTERIA)

## 2015-11-15 LAB — SAVE SMEAR

## 2015-11-15 MED ORDER — IOPAMIDOL (ISOVUE-300) INJECTION 61%
INTRAVENOUS | Status: AC
  Administered 2015-11-15: 75 mL
  Filled 2015-11-15: qty 75

## 2015-11-15 MED ORDER — POTASSIUM CHLORIDE 10 MEQ/100ML IV SOLN
10.0000 meq | INTRAVENOUS | Status: AC
  Administered 2015-11-15 (×3): 10 meq via INTRAVENOUS
  Filled 2015-11-15 (×3): qty 100

## 2015-11-15 NOTE — Progress Notes (Signed)
Noted ID plans and discussions about intermittent fevers. I will follow up tomorrow for possible admission after updating [employer] insurance on plans. [phone]

## 2015-11-15 NOTE — Progress Notes (Signed)
Speech Language Pathology Treatment: Dysphagia  Patient Details Name: Samantha Kline MRN: [id number] DOB: 03-03-1961 Today's Date: 11/15/2015 Time: 1102-1117 SLP Time Calculation (min) (ACUTE ONLY): 14 min  Assessment / Plan / Recommendation Clinical Impression  Pt again demonstrates poor responsiveness to interventions while in bed this am. Pt will open eyes to voice, but does not respond to commands or verbally respond unless max verbal cues are given. Pt again holding secretions in her mouth and needed max verbal cues to spit them out since suction was not functional. Attempted max cues, hand over hand assist for self feeding orange juice again with piecemeal swallow and oral holding. Could not attempt dys 2 solids on am tray given mentation. Given that pts function waxes and wanes, will not downgrade diet until pt demonstrates persistent intolerance to solids (apparently yesterday she was very successful with eating and drinking). Will continue efforts, aspiration risk high with holding behaviors.    HPI HPI: Samantha Kline is a 55 y.o. female with medical history significant of recently diagnosed HIV, recently treated pneumonia, who presents with altered mental status, generalized weakness, nausea and vomiting.Head CT and CXR negative. 50 lb unintentional weight loss over the past 5 - 6 months along with decreased appetite.  Pt underwent MBS yesterday and tody is seen for tolerance, readiness for advancement.       SLP Plan  Continue with current plan of care     Recommendations  Diet recommendations: Thin liquid;Dysphagia 2 (fine chop) Liquids provided via: Cup;Straw Medication Administration: Whole meds with puree Supervision: Full supervision/cueing for compensatory strategies Compensations: Slow rate;Small sips/bites;Follow solids with liquid Postural Changes and/or Swallow Maneuvers: Seated upright 90 degrees;Upright 30-60 min after meal             Oral Care Recommendations: Oral  care BID Follow up Recommendations: Skilled Nursing facility Plan: Continue with current plan of care     GO                Samantha Kline, [name] 11/15/2015, 10:11 AM

## 2015-11-15 NOTE — H&P (Signed)
Physical Medicine and Rehabilitation Admission H&P    Chief Complaint  Patient presents with  . Debility with Encephalopathy/newly diagnosed HIV      HPI: Samantha Kline is a 55 y.o. female with unintentional 50 lbs weight loss over past few months, anorexia, SOB with progressive weakness. She was admitted 07/03-7/10/17 with fever due to PCP PNA and diagnosed with HIV. She was discharged to home on antiviral therapy but continued to have malaise, N/V with poor oral intake and inability to walk. She was readmitted on 07/17 with lethargy, confusion and weakness due to hyponatremia with Na- 110.  She was treated with fluid boluses and kayexalate for management of hyperkalemia.  CT head negative for infection or acute changes. Mentation improving and she has had improvement in electrolyte abnormality but decrease in ability to eat due to dysphagia. She was started on IV flucanazole for candida esophagitis.  She spiked temp of 103.4 on 07/29 and was started on empiric treatment for MAC with resolution of low grade fevers.  She has had decrease in viral load from 200K to 200 with increase in white count and concerns about risk of IRIS.  She underwent endoscopy (Dr. Watt Climes)  with biopsies to rule out CMV/HSV on 8/02 due to continued odynophagia. EGD showed ulcers and high CMV viral load.   Pancytopenia treated Granix and she was started on Gancyclovir due to high likelihood of CMV esophagitis. She has had improvement in WBC from 1.6-->10.3 and platelets from 115-->195.  She continues to have poor po intake but has refused feeding tube or PEG.  Hyponatremia improving with electrolyte abnormalities being treated as needed. She continues to be  Debilitated but showing increase in voice volume and stabilization of medical status.  Therapy ongoing and CIR recommended for follow up therapy.    Review of Systems  Constitutional: Positive for malaise/fatigue and weight loss.  HENT: Negative for hearing loss.     Eyes: Negative for blurred vision and double vision.  Respiratory: Negative for cough and shortness of breath.   Cardiovascular: Negative for chest pain and palpitations.  Gastrointestinal: Negative for abdominal pain, heartburn and nausea.  Genitourinary: Negative for dysuria.  Musculoskeletal: Negative for back pain and myalgias.  Neurological: Positive for weakness. Negative for dizziness and headaches.  Psychiatric/Behavioral: Positive for hallucinations and memory loss.      Past Medical History:  Diagnosis Date  . Anemia   . HIV (human immunodeficiency virus infection) ([place])    11/06/15 Family states it was just diagnosed this week  . Pneumonia 10/24/2015  . Vitamin D deficiency     Past Surgical History:  Procedure Laterality Date  . ESOPHAGOGASTRODUODENOSCOPY N/A 11/23/2015   Procedure: ESOPHAGOGASTRODUODENOSCOPY (EGD);  Surgeon: Clarene Essex, MD;  Location: [facility] ENDOSCOPY;  Service: Endoscopy;  Laterality: N/A;  . TUBAL LIGATION  1980s    Family History  Problem Relation Age of Onset  . Diabetes Father   . Heart attack Brother   . Hypertension Sister     Social History:  Married.  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  She reports that she quit smoking in 1980's.  She has never used smokeless tobacco. She reports that she drinks alcohol. She reports that she does not use illicit drugs.    Allergies: No Known Allergies    Medications Prior to Admission  Medication Sig Dispense Refill  . azithromycin (ZITHROMAX) 600 MG tablet Take 2 tablets (1,200 mg total) by mouth once a week. (Patient taking differently:  Take 1,200 mg by mouth every Friday. ) 4 tablet 6  . dolutegravir (TIVICAY) 50 MG tablet Take 1 tablet (50 mg total) by mouth daily. (Patient taking differently: Take 50 mg by mouth daily at 12 noon. ) 30 tablet 3  . emtricitabine-tenofovir AF (DESCOVY) 200-25 MG tablet Take 1 tablet by mouth daily. (Patient taking differently: Take 1 tablet by  mouth daily at 12 noon. ) 30 tablet 3  . ferrous sulfate 325 (65 FE) MG tablet Take 325 mg by mouth daily with breakfast.    . fluconazole (DIFLUCAN) 100 MG tablet Take 1 tablet (100 mg total) by mouth once a week. (Patient taking differently: Take 100 mg by mouth every Friday. ) 4 tablet 6  . predniSONE (DELTASONE) 20 MG tablet Take 40 mg PO q daily for 5 days, followed by 30 mg PO q daily for 11 days. Qty Sufficient (Patient taking differently: Take 30-40 mg by mouth daily at 12 noon. Take 2 tablets (40 mg) by mouth daily at noon for 5 days, then take 1 1/2 tablets (30 mg) daily for 11 days, then stop) 40 tablet 0  . sulfamethoxazole-trimethoprim (BACTRIM DS,SEPTRA DS) 800-160 MG tablet 2 tabs PO TID x 16 days, then 1 tab PO q daily thereafter Qty Sufficient (Patient taking differently: Take 1-23 tablets by mouth See admin instructions. Take 2 tablets by mouth 3 times daily for 16 days, then take 1 tablet daily.) 36 tablet 0  . vitamin B-12 (CYANOCOBALAMIN) 100 MCG tablet Take 100 mcg by mouth daily.    . Vitamin D, Ergocalciferol, (DRISDOL) 50000 units CAPS capsule Take 50,000 Units by mouth every Friday.       Home: Home Living Family/patient expects to be discharged to:: Inpatient rehab Living Arrangements: Spouse/significant other Available Help at Discharge:  (spouse states he will talk to family about help providing 24) Type of Home: House Home Access: Stairs to enter [employer] of Steps: 2 Entrance Stairs-Rails: None Home Layout: Multi-level Alternate Level Stairs-Number of Steps: 5 up and 5 down Alternate Level Stairs-Rails: Left Bathroom Shower/Tub: Chiropodist: Standard Bathroom Accessibility: Yes Home Equipment: Bedside commode, Environmental consultant - 2 wheels  Lives With: Spouse   Functional History: Prior Function Level of Independence: Needs assistance Gait / Transfers Assistance Needed: walked 100 ft minguard with RW on recent admission; at home  walked with minguard assist, no device, no furniture walking ADL's / Homemaking Assistance Needed: Pt indicates spouse was assisting her with ADLs PTA  Comments: spouse had taken off of work since last hospital d/c to provide care  Functional Status:  Mobility: Bed Mobility Overal bed mobility: Needs Assistance Bed Mobility: Supine to Sit Rolling: Mod assist Sidelying to sit: Mod assist, +2 for physical assistance Supine to sit: Max assist, +2 for physical assistance Sit to supine: Total assist, +2 for physical assistance Sit to sidelying: Max assist, +2 for physical assistance, +2 for safety/equipment General bed mobility comments: Pt OOB in chair upon arrival. Transfers Overall transfer level: Needs assistance Equipment used: Rolling walker (2 wheeled) Transfers: Sit to/from Stand, Stand Pivot Transfers Sit to Stand: +2 physical assistance, Min assist Stand pivot transfers: Mod assist, +2 safety/equipment General transfer comment: Not assessed at this time. Ambulation/Gait Ambulation/Gait assistance: Min assist, Mod assist, +2 safety/equipment Ambulation Distance (Feet): 40 Feet Assistive device: Rolling walker (2 wheeled) Gait Pattern/deviations: Step-through pattern, Decreased stride length, Trunk flexed General Gait Details: deferred due to tachycardia and fatigue Gait velocity: very slow pattern    ADL: ADL  Overall ADL's : Needs assistance/impaired Eating/Feeding: Supervision/ safety Eating/Feeding Details (indicate cue type and reason): Pt has difficulty bringing food to mouth due to weakness Grooming: Minimal assistance, Oral care, Wash/dry face, Sitting Grooming Details (indicate cue type and reason): Assist for cap back on toothpaste and set up for all supplies. Pt quick to fatigue. Upper Body Bathing: Sitting, Total assistance Upper Body Bathing Details (indicate cue type and reason): max assist to maintain dynamic sitting EOB Lower Body Bathing: Maximal assistance,  +2 for physical assistance, Bed level Upper Body Dressing : Total assistance, Sitting Upper Body Dressing Details (indicate cue type and reason): max assist to maintain dynamic sitting EOB  Lower Body Dressing: Maximal assistance, +2 for physical assistance, Bed level Toilet Transfer: Moderate assistance, +2 for physical assistance, +2 for safety/equipment Toilet Transfer Details (indicate cue type and reason): simulated EOB to recliner transfer  [place] and Hygiene: Total assistance, Sit to/from stand Toileting - Clothing Manipulation Details (indicate cue type and reason): Pt incontinenet if BM Functional mobility during ADLs: Maximal assistance General ADL Comments: Pt fatigues very quickly with functional activities. Pt noted to have flexed posture of wrists and digits; pt able to actively come out of posture with verbal cues. Encouraged proper positioning of bil UEs.   Cognition: Cognition Overall Cognitive Status: Impaired/Different from baseline Orientation Level: Oriented X4 Cognition Arousal/Alertness: Awake/alert Behavior During Therapy: Flat affect Overall Cognitive Status: Impaired/Different from baseline Area of Impairment: Following commands, Problem solving Orientation Level: Disoriented to, Time, Place Current Attention Level: Selective Memory: Decreased short-term memory Following Commands: Follows one step commands with increased time, Follows one step commands consistently Safety/Judgement: Decreased awareness of safety Awareness: Anticipatory Problem Solving: Slow processing General Comments: Increased time requried for processing  Difficult to assess due to: Level of arousal   Blood pressure 126/81, pulse 99, temperature 98.2 F (36.8 C), temperature source Oral, resp. rate 18, height '5\' 4"'$  (1.626 m), weight 60.6 kg (133 lb 11.2 oz), SpO2 100 %. Physical Exam  Nursing note and vitals reviewed. Constitutional: Vital signs are normal. She  appears listless. She appears cachectic. She has a sickly appearance.  Sunken cheeks with stiff face with fusion of edges of lips and decreased ROM.   HENT:  Head: Normocephalic and atraumatic.  Mouth/Throat: Oral lesions present.  Eyes: Conjunctivae are normal. Pupils are equal, round, and reactive to light. No scleral icterus.  Neck: Neck supple.  Decreased ROM  Cardiovascular: Regular rhythm.  Tachycardia present.   No murmur heard. HR 120 at rest  Respiratory: Effort normal and breath sounds normal. No stridor. No respiratory distress. She has no wheezes.  GI: Soft. Bowel sounds are normal. She exhibits no distension. There is no tenderness.  Musculoskeletal: She exhibits no edema or tenderness.  Muscle wasting all limbs with decreased ROM.  Neurological: She appears listless.  Oriented to self and place. Situation "sickness".  Disoriented and hallucinating? (asked about daughter but no visitor today per nursing). Able to follow simple motor commands.  UE motor: 3/5 deltoid, biceps, triceps, 3+ wrist and HI. LE: 2/5 HF, 2+ KE/KF and 3/5 ADF/PF. Senses pinch in all 4's.   Skin: Skin is warm and dry. No rash noted. No erythema.  Psychiatric: Her affect is inappropriate. Her speech is delayed. She is slowed and withdrawn. Cognition and memory are impaired.    Results for orders placed or performed during the hospital encounter of 11/06/15 (from the past 48 hour(s))  Basic metabolic panel     Status: Abnormal  Collection Time: 11/24/15  4:54 AM  Result Value Ref Range   Sodium 132 (L) 135 - 145 mmol/L   Potassium 3.7 3.5 - 5.1 mmol/L    Comment: DELTA CHECK NOTED   Chloride 103 101 - 111 mmol/L   CO2 23 22 - 32 mmol/L   Glucose, Bld 99 65 - 99 mg/dL   BUN 7 6 - 20 mg/dL   Creatinine, Ser 0.61 0.44 - 1.00 mg/dL   Calcium 7.3 (L) 8.9 - 10.3 mg/dL   GFR calc non Af Amer >60 >60 mL/min   GFR calc Af Amer >60 >60 mL/min    Comment: (NOTE) The eGFR has been calculated using the CKD  EPI equation. This calculation has not been validated in all clinical situations. eGFR's persistently <60 mL/min signify possible Chronic Kidney Disease.    Anion gap 6 5 - 15  CBC     Status: Abnormal   Collection Time: 11/24/15  4:54 AM  Result Value Ref Range   WBC 10.3 4.0 - 10.5 K/uL   RBC 3.08 (L) 3.87 - 5.11 MIL/uL   Hemoglobin 9.3 (L) 12.0 - 15.0 g/dL   HCT 28.8 (L) 36.0 - 46.0 %   MCV 93.5 78.0 - 100.0 fL   MCH 30.2 26.0 - 34.0 pg   MCHC 32.3 30.0 - 36.0 g/dL   RDW 19.0 (H) 11.5 - 15.5 %   Platelets 195 150 - 400 K/uL  Magnesium     Status: None   Collection Time: 11/24/15  4:54 AM  Result Value Ref Range   Magnesium 1.7 1.7 - 2.4 mg/dL   No results found.     Medical Problem List and Plan: 1.  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  DVT Prophylaxis/Anticoagulation: Mechanical: Sequential compression devices, below knee Bilateral lower extremities 3. Pain Management: Tylenol prn 4. Mood: LCSW to follow for evaluation and support.  5. Neuropsych: This patient is not fully capable of making decisions on her own behalf. 6. Skin/Wound Care: Will order air mattress overlay due to malnutrition, poor intake and stage 2 sacral ulcer.   -is at big risk for further breakdown 7. Fluids/Electrolytes/Nutrition:  Continue dextrose for now.  8. HIV/AIDS with MAC?:  On descovy, ticovey, biaxin and ethambutol.   -follow up per ID   9. Candida/CMV esophagitis: IV diflucan since 7/21 and On Ganciclovir since 08/03 10. FTT: Continue IVF for now. Will start calorie count and check prealbumin. Will need routine check of lytes due to risk of refeeding syndrome.  11. Pancytopenia:  Will check labs in am and next Tues--as anticipate drop in numbers 5-7 days after Granix 12. Hyponatremia/Hypokalemia: Monitor lytes with Mg and Phos levels every 3-4 days.  13. PCP prophylaxis: Bactrim discontinued due to hyponatremia. Has  been treated for PCP infection and dapsone resumed 8/4  for prophylaxis.    Post Admission Physician Evaluation: 1. Functional deficits secondary  to HIV encephalopathy. 2. Patient is admitted to receive collaborative, interdisciplinary care between the physiatrist, rehab nursing staff, and therapy team. 3. Patient's level of medical complexity and substantial therapy needs in context of that medical necessity cannot be provided at a lesser intensity of care such as a SNF. 4. Patient has experienced substantial functional loss from his/her baseline which was documented above under the "Functional History" and "Functional Status" headings.  Judging by the patient's diagnosis, physical exam, and functional history, the patient has potential for functional progress which will result  in measurable gains while on inpatient rehab.  These gains will be of substantial and practical use upon discharge  in facilitating mobility and self-care at the household level. 5. Physiatrist will provide 24 hour management of medical needs as well as oversight of the therapy plan/treatment and provide guidance as appropriate regarding the interaction of the two. 6. 24 hour rehab nursing will assist with bladder management, bowel management, safety, skin/wound care, disease management, medication administration, pain management and patient education  and help integrate therapy concepts, techniques,education, etc. 7. PT will assess and treat for/with: Lower extremity strength, range of motion, stamina, balance, functional mobility, safety, adaptive techniques and equipment, cognitive perceptual rx, activity tolerance, family ed.   Goals are: supervision to min assist. 8. OT will assess and treat for/with: ADL's, functional mobility, safety, upper extremity strength, adaptive techniques and equipment, NMR, cognitive perceptual rx, family education, ego support.   Goals are: supervision to min assist. Therapy may  proceed with  showering this patient. 9. SLP will assess and treat for/with: cognition, communication.   Goals are: mod I. 10. Case Management and Social Worker will assess and treat for psychological issues and discharge planning. 11. Team conference will be held weekly to assess progress toward goals and to determine barriers to discharge. 12. Patient will receive at least 3 hours of therapy per day at least 5 days per week. 13. ELOS: 16-22 days       14. Prognosis:  good     Meredith Staggers, MD, [place] [facility] 11/25/2015  11/25/2015

## 2015-11-15 NOTE — Consult Note (Signed)
Referring Provider: Dr. Elisabeth Pigeon Primary Care Physician:  No PCP Per Patient Primary Gastroenterologist:  Gentry Fitz  Reason for Consultation:  Dysphagia  HPI: Samantha Kline is a 55 y.o. female who has recently diagnosed HIV and was in the hospital in early July for HIV pneumonia, who is being seen for a consult due to dysphagia. She is unable to give me any history about the dysphagia other than food is slow to go down but cannot tell me how long it has occurred or whether it happens everytime or with liquids. Denies odynophagia, heartburn. Reports 50 pound weight loss in the past 6 months. She has been very weak with N/V/anorexia prior to admit. Having fevers of unknown origin. CD4 count pending. Feels like swallowing has improved since Fluconazole IV dose increased to 400 mg from 100 mg and recommended by ID to continue that dose for 21 days. Hgb 7.7. No melena, hematochezia.  Past Medical History:  Diagnosis Date  . Anemia   . HIV (human immunodeficiency virus infection) (HCC)    11/06/15 Family states it was just diagnosed this week  . Pneumonia 10/24/2015  . Vitamin D deficiency     Past Surgical History:  Procedure Laterality Date  . TUBAL LIGATION  1980s    Prior to Admission medications   Medication Sig Start Date End Date Taking? Authorizing Provider  azithromycin (ZITHROMAX) 600 MG tablet Take 2 tablets (1,200 mg total) by mouth once a week. Patient taking differently: Take 1,200 mg by mouth every Friday.  10/31/15  Yes Jeralyn Bennett, MD  dolutegravir (TIVICAY) 50 MG tablet Take 1 tablet (50 mg total) by mouth daily. Patient taking differently: Take 50 mg by mouth daily at 12 noon.  10/31/15  Yes Jeralyn Bennett, MD  emtricitabine-tenofovir AF (DESCOVY) 200-25 MG tablet Take 1 tablet by mouth daily. Patient taking differently: Take 1 tablet by mouth daily at 12 noon.  10/31/15  Yes Jeralyn Bennett, MD  ferrous sulfate 325 (65 FE) MG tablet Take 325 mg by mouth daily with  breakfast.   Yes Historical Provider, MD  fluconazole (DIFLUCAN) 100 MG tablet Take 1 tablet (100 mg total) by mouth once a week. Patient taking differently: Take 100 mg by mouth every Friday.  11/04/15  Yes Jeralyn Bennett, MD  predniSONE (DELTASONE) 20 MG tablet Take 40 mg PO q daily for 5 days, followed by 30 mg PO q daily for 11 days. Qty Sufficient Patient taking differently: Take 30-40 mg by mouth daily at 12 noon. Take 2 tablets (40 mg) by mouth daily at noon for 5 days, then take 1 1/2 tablets (30 mg) daily for 11 days, then stop 10/31/15  Yes Jeralyn Bennett, MD  sulfamethoxazole-trimethoprim (BACTRIM DS,SEPTRA DS) 800-160 MG tablet 2 tabs PO TID x 16 days, then 1 tab PO q daily thereafter Qty Sufficient Patient taking differently: Take 1-23 tablets by mouth See admin instructions. Take 2 tablets by mouth 3 times daily for 16 days, then take 1 tablet daily. 10/31/15  Yes Jeralyn Bennett, MD  vitamin B-12 (CYANOCOBALAMIN) 100 MCG tablet Take 100 mcg by mouth daily.   Yes Historical Provider, MD  Vitamin D, Ergocalciferol, (DRISDOL) 50000 units CAPS capsule Take 50,000 Units by mouth every Friday.  10/03/15  Yes Historical Provider, MD    Scheduled Meds: . antiseptic oral rinse  7 mL Mouth Rinse BID  . azithromycin  1,200 mg Oral Q Fri  . clindamycin  600 mg Oral Q8H   And  . primaquine  30 mg Oral  Daily  . dolutegravir  50 mg Oral Q1200  . emtricitabine-tenofovir AF  1 tablet Oral Q1200  . feeding supplement (ENSURE ENLIVE)  237 mL Oral TID BM  . ferrous sulfate  325 mg Oral Q breakfast  . fluconazole (DIFLUCAN) IV  400 mg Intravenous Q24H  . multivitamin with minerals  1 tablet Oral Daily  . sodium chloride flush  3 mL Intravenous Q12H  . vitamin B-12  100 mcg Oral Daily  . Vitamin D (Ergocalciferol)  50,000 Units Oral Q Fri   Continuous Infusions: . dextrose 5 % and 0.9% NaCl 75 mL/hr at 11/15/15 0609   PRN Meds:.acetaminophen **OR** acetaminophen, albuterol,  dextromethorphan-guaiFENesin, metoprolol, ondansetron  Allergies as of 11/06/2015  . (No Known Allergies)    Family History  Problem Relation Age of Onset  . Diabetes Father   . Heart attack Brother   . Hypertension Sister     Social History   Social History  . Marital status: Married    Spouse name: N/A  . Number of children: N/A  . Years of education: N/A   Occupational History  . Not on file.   Social History Main Topics  . Smoking status: Never Smoker  . Smokeless tobacco: Never Used  . Alcohol use Yes     Comment: 10/24/2015 "nothing since the 1980s"  . Drug use: No  . Sexual activity: Not on file   Other Topics Concern  . Not on file   Social History Narrative  . No narrative on file    Review of Systems: All negative except as stated above in HPI.  Physical Exam: Vital signs: Vitals:   11/15/15 1100 11/15/15 1450  BP: 140/72 (!) 111/59  Pulse: 100 100  Resp:  18  Temp: (!) 101.9 F (38.8 C) 98.7 F (37.1 C)   Last BM Date: 11/13/15 General:   Lethargic, thin, frail, no acute distress  HEENT: anicteric sclera; no thrush seen Lungs:  Clear throughout to auscultation.   No wheezes, crackles, or rhonchi. No acute distress. Heart:  Regular rate and rhythm; no murmurs, clicks, rubs,  or gallops. Abdomen: soft, nontender, nondistended, +BS  Rectal:  Deferred Ext: no edema  GI:  Lab Results:  Recent Labs  11/14/15 0547 11/15/15 1136 11/15/15 1318  WBC 1.3* 0.7* 0.7*  HGB 8.5* 8.0* 7.7*  HCT 25.6* 23.9* 23.9*  PLT 92* 79* 80*   BMET  Recent Labs  11/13/15 0503 11/14/15 0547 11/15/15 1136  NA 128* 127* 127*  K 3.6 3.5 3.2*  CL 97* 96* 95*  CO2 27 25 26   GLUCOSE 109* 120* 112*  BUN 9 10 5*  CREATININE 0.61 0.56 0.60  CALCIUM 7.7* 7.6* 7.6*   LFT No results for input(s): PROT, ALBUMIN, AST, ALT, ALKPHOS, BILITOT, BILIDIR, IBILI in the last 72 hours. PT/INR No results for input(s): LABPROT, INR in the last 72  hours.   Studies/Results: Ct Chest W Contrast  Result Date: 11/15/2015 CLINICAL DATA:  Recent diagnosis of HIV. Recently treated for pneumonia. Altered mental status. Generalized weakness. EXAM: CT CHEST WITH CONTRAST TECHNIQUE: Multidetector CT imaging of the chest was performed during intravenous contrast administration. CONTRAST:  75mL ISOVUE-300 IOPAMIDOL (ISOVUE-300) INJECTION 61% COMPARISON:  Chest CT -10/24/2015; chest radiograph - 11/11/2015 FINDINGS: Cardiovascular: Normal heart size. Coronary artery calcifications. No pericardial effusion. Although this examination was not tailored for the evaluation the pulmonary arteries, there are no discrete filling defects within the central pulmonary arterial tree to suggest central pulmonary embolism. Normal caliber of  the main pulmonary artery. Normal caliber the thoracic aorta. Calcified atherosclerotic plaque within the aortic arch. Bovine configuration of the aortic arch. Mediastinum/Nodes: Scattered mediastinal lymph nodes are individually not enlarged by size criteria with index precarinal lymph node measuring 0.7 cm in greatest short axis diameter. No bulky mediastinal, hilar axillary lymphadenopathy. Minimal amount of ill-defined tissue within the anterior mediastinum is favored to represent residual thymic tissue. Lungs/Pleura: Overall improved aeration of lungs with resolution of the majority of bilateral tree-in-bud nodular opacities. Residual ill-defined nodular opacities are seen within the bilateral lower lobes, right greater than left. No new focal airspace opacities. No air bronchograms. No pleural effusion or pneumothorax. The central pulmonary airways appear widely patent. Minimal dependent subpleural ground-glass atelectasis. Upper Abdomen: Limited evaluation of the upper abdomen is unremarkable. Musculoskeletal: No acute or aggressive osseous abnormalities. Regional soft tissues appear normal. Normal appearance of the thyroid gland.  IMPRESSION: 1. Improved aeration of the lungs with minimal residual ill-defined tree-in-bud opacities with the bilateral lung bases, right greater than left, favored to represent residual though much improved atypical infection. No new or worsening focal airspace opacities. 2. Coronary artery calcifications. Aortic Atherosclerosis (ICD10-170.0) Electronically Signed   By: Simonne Come M.D.   On: 11/15/2015 02:06   Impression/Plan: Dysphagia in the setting of an immunocompromised state from HIV and likely has Candidal esophagitis. Other opportunistic infections such as Herpes esophagitis possible but with improvement on anti-fungals I think the source is most likely Candida. Reports improvement in eating likely due to the higher dose of Fluconazole. Would continue IV Fluconazole (not oral) for now and would not recommend an EGD if she continues to improve especially with marked leucopenia. Supportive care. Will sign off. Call us back if dysphagia worsens despite Fluconazole.    LOS: 8 days   Jaxan Michel C.  11/15/2015, 7:43 PM  Pager [phone]  If no answer or after 5 PM call [phone]

## 2015-11-15 NOTE — Care Management Note (Signed)
Case Management Note  Patient Details  Name: Faithe Hildreth MRN: [id number] Date of Birth: Oct 13, 1960  Subjective/Objective:                 Patient continues treatment for PCP PNA, had fever last night, anticipate DC to CIR at end of week if improves.   Action/Plan:   Expected Discharge Date:                  Expected Discharge Plan:  IP Rehab Facility  In-House Referral:     Discharge planning Services  CM Consult  Post Acute Care Choice:    Choice offered to:     DME Arranged:    DME Agency:     HH Arranged:    HH Agency:     Status of Service:  In process, will continue to follow  If discussed at Long Length of Stay Meetings, dates discussed:    Additional Comments:  Lawerance Sabal, RN 11/15/2015, 3:18 PM

## 2015-11-15 NOTE — Progress Notes (Addendum)
Patient ID: Samantha Kline, female   DOB: 1960-09-09, 55 y.o.   MRN: [id number]  PROGRESS NOTE    Marua Qin  WUJ:[id number] DOB: April 28, 1960 DOA: 11/06/2015  PCP: No PCP Per Patient   Brief Narrative:  55 y.o. female with past medical history significant for recently diagnosed HIV, recent hospitalization from 10/24/2015 through 10/31/2015 for new diagnosis of HIV pneumonia. Patient was subsequently discharged on Bactrim and prednisone with concerns for pneumocystis pneumonia. Apparently patient was doing well for couple of days after discharge but then started to decline with generalized weakness, loss of appetite, nausea, vomiting and confusion. Patient's family reported no blood with vomiting. No diarrhea. No fevers or chills.  In ED, patient was hemodynamically stable. Blood work was notable for sodium of 110, potassium 5.3, bicarbonate 18, glucose 161. Lactic acid was 2.65 but subsequently normalized. CT head showed no acute intracranial process. Chest x-ray showed no acute abnormalities.  Barrier to discharge: Ongoing fever, leukopenia and difficulty swallowing. ID following. GI consulted today.    Assessment & Plan:  Acute metabolic encephalopathy / hypoosmolar hyponatremia - Likely secondary to severe hyponatremia. Sodium is low as 110 on the admission - Hyponatremia also likely because of Bactrim - No acute findings on CT head - Sodium 127-128  Fever of unknown origin - Ongoing fever despite multiple abx. Possible PCP infection, IRIS or from heavy mucous secretions - Blood cultures on admission and repeat cultures negative - Most recent CXR showed low lung volumes and opacity at the left base  - CY chest with improved aeration of the lungs with minimal residual ill defined opacities right greater than left, favors atypical infection. No new or worsening focal airspace disease  - Appreciate ID following   Oral thrush / Dysphagia  - Continue fluconazole, dose increased per ID to  400 mg instead of 100 mg daily. 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   - For PCP prophylaxis she is on dapsone and for PCP infection she is on prednisone, clindamycin and primaquine - Continue azithromycin for MAC prophylaxis  Anemia of chronic disease - Hemoglobin ranges 8 - 8.5 in past 24 hours   Leukopenia / ?neutropenia  - Likely from HIV versus acute infection - Order placed for peripheral smear and differential - If low neutrophil count will add filgrastim  Hyperkalemia / Hypokalemia  - Secondary to Bactrim - She was given Kayexalate  - Repeat potassium within normal limits and then this am slightly low at 3.2 - Supplemented this am with potassium 10 meq x 3 IV doses - Check BMP in am  Moderate protein calorie malnutrition - In the context of chronic illness - Continue ensure supplementation   DVT prophylaxis: Stop Lovenox and use SCD's bilaterally due to drop in platelets over past 2 days 102 --> 79 Code Status: full code  Family Communication: husband at the bedside Disposition Plan: spiking fever, not stable for discharge    Consultants:   ID  SLP  GI, [facility], consulted 7/25  Procedures:   None   Antimicrobials:   HAART  Bactrim, stop  11/08/2015  Dapsone 11/08/2015 -->  Clindamycin 11/08/2015 -->  Primaquine 11/08/2015 -->  Fluconazole 11/11/2015 -->   Azithromycin    Subjective: No overnight events.  Objective: Vitals:   11/14/15 1450 11/14/15 2151 11/15/15 0602 11/15/15 1100  BP: (!) 106/58 136/80 (!) 159/79 140/72  Pulse: (!) 106 95 90 100  Resp: 19  19 17    Temp: 98.4 F (36.9 C) 99.5 F (37.5 C) 100 F (37.8 C) (!) 101.9 F (38.8 C)  TempSrc:    Axillary  SpO2: 96% 97% 97%   Weight:      Height:        Intake/Output Summary (Last 24 hours) at 11/15/15 1301 Last data filed at 11/15/15 0950  Gross per 24 hour  Intake              800 ml  Output                0 ml  Net              800 ml   Filed Weights   11/07/15 0900  Weight: 52.8 kg (116 lb 4.8 oz)    Examination:  General exam: oral thrush, no acute distress  Respiratory system: No wheezing, diminished breath sounds  Cardiovascular system: S1 & S2 (+), tachycardic  Gastrointestinal system: (+) BS, no tenderness, no distention  Central nervous system: Nonfocal  Extremities: No swelling, palpable pulses  Skin: warm, dry Psychiatry: normal mood and behavior  Data Reviewed: I have personally reviewed following labs and imaging studies  CBC:  Recent Labs Lab 11/11/15 0551 11/12/15 0500 11/13/15 0503 11/14/15 0547 11/15/15 1136  WBC 2.0* 1.6* 1.2* 1.3* 0.7*  HGB 8.2* 7.9* 8.1* 8.5* 8.0*  HCT 23.5* 23.1* 24.6* 25.6* 23.9*  MCV 87.4 88.2 89.8 90.1 90.5  PLT 133* 127* 102* 92* 79*   Basic Metabolic Panel:  Recent Labs Lab 11/11/15 0551 11/12/15 0500 11/13/15 0503 11/14/15 0547 11/15/15 1136  NA 129* 128* 128* 127* 127*  K 3.5 3.7 3.6 3.5 3.2*  CL 99* 97* 97* 96* 95*  CO2 26 27 27 25 26   GLUCOSE 117* 161* 109* 120* 112*  BUN 12 11 9 10  5*  CREATININE 0.69 0.60 0.61 0.56 0.60  CALCIUM 7.6* 7.7* 7.7* 7.6* 7.6*   GFR: Estimated Creatinine Clearance: 65.5 mL/min (by C-G formula based on SCr of 0.8 mg/dL). Liver Function Tests: No results for input(s): AST, ALT, ALKPHOS, BILITOT, PROT, ALBUMIN in the last 168 hours. No results for input(s): LIPASE, AMYLASE in the last 168 hours. No results for input(s): AMMONIA in the last 168 hours. Coagulation Profile: No results for input(s): INR, PROTIME in the last 168 hours. Cardiac Enzymes: No results  for input(s): CKTOTAL, CKMB, CKMBINDEX, TROPONINI in the last 168 hours. BNP (last 3 results) No results for input(s): PROBNP in the last 8760 hours. HbA1C: No results for input(s): HGBA1C in the last 72 hours. CBG: No results for input(s): GLUCAP in the last 168 hours. Lipid Profile: No results for input(s): CHOL, HDL, LDLCALC, TRIG, CHOLHDL, LDLDIRECT in the last 72 hours. Thyroid Function Tests: No results for input(s): TSH, T4TOTAL, FREET4, T3FREE, THYROIDAB in the last 72 hours. Anemia Panel: No results for input(s): VITAMINB12, FOLATE, FERRITIN, TIBC, IRON, RETICCTPCT in the last 72 hours. Urine analysis:    Component Value Date/Time   COLORURINE YELLOW 11/06/2015 2236   APPEARANCEUR CLEAR 11/06/2015 2236   LABSPEC 1.029 11/06/2015 2236   PHURINE 6.0 11/06/2015 2236   GLUCOSEU NEGATIVE 11/06/2015  2236   HGBUR NEGATIVE 11/06/2015 2236   BILIRUBINUR NEGATIVE 11/06/2015 2236   KETONESUR NEGATIVE 11/06/2015 2236   PROTEINUR 30 (A) 11/06/2015 2236   NITRITE NEGATIVE 11/06/2015 2236   LEUKOCYTESUR NEGATIVE 11/06/2015 2236   Sepsis Labs: (procalcitonin:4,lacticidven:4)   Recent Results (from the past 240 hour(s))  Culture, blood (Routine x 2)     Status: None (Preliminary result)   Collection Time: 11/06/15  8:30 PM  Result Value Ref Range Status   Specimen Description BLOOD LEFT ARM  Final   Special Requests IN PEDIATRIC BOTTLE 2CC  Final   Culture NO GROWTH 3 DAYS  Final   Report Status PENDING  Incomplete  Culture, blood (Routine x 2)     Status: None (Preliminary result)   Collection Time: 11/06/15  8:45 PM  Result Value Ref Range Status   Specimen Description BLOOD LEFT ARM  Final   Special Requests BOTTLES DRAWN AEROBIC AND ANAEROBIC 4CC   Final   Culture NO GROWTH 3 DAYS  Final   Report Status PENDING  Incomplete  Urine culture     Status: None   Collection Time: 11/06/15 10:36 PM  Result Value Ref Range Status   Specimen Description URINE,  CATHETERIZED  Final   Special Requests NONE  Final   Culture NO GROWTH  Final   Report Status 11/08/2015 FINAL  Final  MRSA PCR Screening     Status: None   Collection Time: 11/09/15 11:54 AM  Result Value Ref Range Status   MRSA by PCR NEGATIVE NEGATIVE Final      Radiology Studies: Dg Chest Port 1 View 11/11/2015 Low lung volumes. Subtle opacity at the left lung base, suspicious for pneumonia. Aortic atherosclerosis.   Dg Chest 1 View 11/06/2015  No acute abnormalities.   Ct Head Wo Contrast 11/07/2015  No acute intracranial process.    Marland Kitchen antiseptic oral rinse  7 mL Mouth Rinse BID  . azithromycin  1,200 mg Oral Q Fri  . clindamycin  600 mg Oral Q8H   And  . primaquine  30 mg Oral Daily  . dolutegravir  50 mg Oral Q1200  . emtricitabine-tenofovir AF  1 tablet Oral Q1200  . feeding supplement (ENSURE ENLIVE)  237 mL Oral TID BM  . ferrous sulfate  325 mg Oral Q breakfast  . fluconazole (DIFLUCAN) IV  400 mg Intravenous Q24H  . multivitamin with minerals  1 tablet Oral Daily  . potassium chloride  10 mEq Intravenous Q1 Hr x 3  . sodium chloride flush  3 mL Intravenous Q12H  . vitamin B-12  100 mcg Oral Daily  . Vitamin D (Ergocalciferol)  50,000 Units Oral Q Fri     Continuous Infusions: . dextrose 5 % and 0.9% NaCl 75 mL/hr at 11/15/15 0609     LOS: 8 days    Time spent: 25 minutes  Greater than mental status sodium 50% of the time spent on counseling and coordinating the care.   Manson Passey, MD [facility] Pager [phone]  If 7PM-7AM, please contact night-coverage [email or web address] Password [email or web address] 11/15/2015, 1:01 PM

## 2015-11-15 NOTE — Progress Notes (Signed)
   11/15/15 1400  Clinical Encounter Type  Visited With Patient and family together  Visit Type Initial  Spiritual Encounters  Spiritual Needs Emotional  Stress Factors  Patient Stress Factors Exhausted;Health changes  Family Stress Factors None identified   Chaplain visited with patient while doing rounds on the floor.  Patient was in the middle of lunch.  Chaplain offered ministry of presence and talked with patient briefly about her stay thus far.  Rosezella [place] Bailie Christenbury 11/15/15 2:25 PM

## 2015-11-15 NOTE — Progress Notes (Signed)
[facility]   Reason for visit: Follow up on HIV, fever  Interval History: improved, some fever still, no new cultures. She tells me she did talk to her husband about being HIV positive and it is ok to talk about HIV with her husband in the room.    Physical Exam: Constitutional:  Vitals:   11/15/15 0602 11/15/15 1100  BP: (!) 159/79 140/72  Pulse: 90 100  Resp: 17   Temp: 100 F (37.8 C) (!) 101.9 F (38.8 C)   patient appears in NAD HENT: opens mouth more, no thrush noted Respiratory: Normal respiratory effort; CTA B Cardiovascular: RRR  Review of Systems: Constitutional: positive for anorexia or negative for chills Gastrointestinal: negative for diarrhea  Lab Results  Component Value Date   WBC 1.3 (LL) 11/14/2015   HGB 8.5 (L) 11/14/2015   HCT 25.6 (L) 11/14/2015   MCV 90.1 11/14/2015   PLT 92 (L) 11/14/2015    Lab Results  Component Value Date   CREATININE 0.56 11/14/2015   BUN 10 11/14/2015   NA 127 (L) 11/14/2015   K 3.5 11/14/2015   CL 96 (L) 11/14/2015   CO2 25 11/14/2015    Lab Results  Component Value Date   ALT 27 11/06/2015   AST 36 11/06/2015   ALKPHOS 76 11/06/2015     Microbiology: Recent Results (from the past 240 hour(s))  Culture, blood (Routine x 2)     Status: None   Collection Time: 11/06/15  8:30 PM  Result Value Ref Range Status   Specimen Description BLOOD LEFT ARM  Final   Special Requests IN PEDIATRIC BOTTLE 2CC  Final   Culture NO GROWTH 5 DAYS  Final   Report Status 11/11/2015 FINAL  Final  Culture, blood (Routine x 2)     Status: None   Collection Time: 11/06/15  8:45 PM  Result Value Ref Range Status   Specimen Description BLOOD LEFT ARM  Final   Special Requests BOTTLES DRAWN AEROBIC AND ANAEROBIC 4CC   Final   Culture NO GROWTH 5 DAYS  Final   Report Status 11/11/2015 FINAL  Final  Urine culture     Status: None   Collection Time: 11/06/15 10:36 PM  Result Value Ref Range Status   Specimen Description URINE, CATHETERIZED  Final   Special Requests NONE  Final   Culture NO GROWTH  Final   Report Status 11/08/2015 FINAL  Final  MRSA PCR Screening     Status: None   Collection Time: 11/09/15 11:54 AM  Result Value Ref Range Status   MRSA by PCR NEGATIVE NEGATIVE Final    Comment:        The GeneXpert MRSA Assay (FDA approved for NASAL specimens only), is one component of a comprehensive MRSA colonization surveillance program. It is not intended to diagnose MRSA infection nor to guide or monitor treatment for MRSA infections.   Culture, blood (routine x 2)     Status: None (Preliminary result)   Collection Time: 11/11/15  4:50 PM  Result Value Ref Range Status   Specimen Description BLOOD LEFT ANTECUBITAL  Final   Special Requests BOTTLES DRAWN AEROBIC ONLY 5CC  Final   Culture NO GROWTH 3 DAYS  Final   Report Status PENDING  Incomplete  Culture, blood (routine x 2)     Status: None (Preliminary result)   Collection Time: 11/11/15  4:55 PM  Result Value Ref Range Status   Specimen Description BLOOD BLOOD LEFT  ARM  Final   Special Requests IN PEDIATRIC BOTTLE 1CC  Final   Culture NO GROWTH 3 DAYS  Final   Report Status PENDING  Incomplete    Impression/Plan:  1. Dysphagia - seems to be improving with fluconazole 400 mg.  Will continue 21 days total.  Can take it po but continuing IV for now with difficulty swallowing.  2. 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 I will check CD4 and viral load tomorrow 3. 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 1 more day to complete 21 days with the previous Bactrim.  Also with steroids.  Both can be stopped after tomorrow.  4. Fever - may be iris, vs esophageal thrush. No other source.  Ok from ID standpoint for rehab when able.  She may continue to have intermittent fevers.

## 2015-11-16 DIAGNOSIS — B3781 Candidal esophagitis: Secondary | ICD-10-CM

## 2015-11-16 LAB — CBC WITH DIFFERENTIAL/PLATELET
BASOS ABS: 0 10*3/uL (ref 0.0–0.1)
Basophils Relative: 2 %
EOS PCT: 4 %
Eosinophils Absolute: 0 10*3/uL (ref 0.0–0.7)
HEMATOCRIT: 24.2 % — AB (ref 36.0–46.0)
HEMOGLOBIN: 8 g/dL — AB (ref 12.0–15.0)
Lymphocytes Relative: 38 %
Lymphs Abs: 0.3 10*3/uL — ABNORMAL LOW (ref 0.7–4.0)
MCH: 30.1 pg (ref 26.0–34.0)
MCHC: 33.1 g/dL (ref 30.0–36.0)
MCV: 91 fL (ref 78.0–100.0)
MONO ABS: 0.1 10*3/uL (ref 0.1–1.0)
MONOS PCT: 9 %
NEUTROS PCT: 47 %
Neutro Abs: 0.5 10*3/uL — ABNORMAL LOW (ref 1.7–7.7)
Platelets: 88 10*3/uL — ABNORMAL LOW (ref 150–400)
RBC: 2.66 MIL/uL — AB (ref 3.87–5.11)
RDW: 18.7 % — AB (ref 11.5–15.5)
WBC: 0.9 10*3/uL — AB (ref 4.0–10.5)

## 2015-11-16 LAB — BASIC METABOLIC PANEL
ANION GAP: 7 (ref 5–15)
BUN: 6 mg/dL (ref 6–20)
CALCIUM: 7.7 mg/dL — AB (ref 8.9–10.3)
CO2: 26 mmol/L (ref 22–32)
Chloride: 95 mmol/L — ABNORMAL LOW (ref 101–111)
Creatinine, Ser: 0.62 mg/dL (ref 0.44–1.00)
GFR calc non Af Amer: >60 mL/min (ref >60)
Glucose, Bld: 109 mg/dL — ABNORMAL HIGH (ref 65–99)
Potassium: 3.5 mmol/L (ref 3.5–5.1)
SODIUM: 128 mmol/L — AB (ref 135–145)

## 2015-11-16 LAB — CULTURE, BLOOD (ROUTINE X 2)
CULTURE: NO GROWTH
CULTURE: NO GROWTH

## 2015-11-16 LAB — PATHOLOGIST SMEAR REVIEW

## 2015-11-16 MED ORDER — PROCHLORPERAZINE EDISYLATE 5 MG/ML IJ SOLN: 10.0000 mg | Freq: Four times a day (QID) | INTRAMUSCULAR | Status: DC | PRN

## 2015-11-16 NOTE — Progress Notes (Signed)
PT Cancellation Note  Patient Details Name: Samantha Kline MRN: [id number] DOB: Dec 24, 1960   Cancelled Treatment:    Reason Eval/Treat Not Completed: Fatigue/lethargy limiting ability to participate;Medical issues which prohibited therapy Pt very fatigued/lethargic this am with low WBC and fever. PT will check on pt later as time allows.    Derek Mound, PTA Pager: [phone]   11/16/2015, 8:46 AM

## 2015-11-16 NOTE — Progress Notes (Signed)
[facility]   Reason for visit: Follow up on HIV, fever to 103  Interval History: improved, some fever still, no new cultures. Some n/v this am.   Physical Exam: Constitutional:  Vitals:   11/16/15 1254 11/16/15 1406  BP: 108/70 (!) 103/53  Pulse: 94 (!) 113  Resp: 16 18  Temp: 100 F (37.8 C) 98.7 F (37.1 C)   patient appears in NAD HENT: opens mouth more, no thrush noted Respiratory: Normal respiratory effort; CTA B Cardiovascular: RRR  Review of Systems: Constitutional: positive for anorexia or negative for chills Gastrointestinal: negative for diarrhea  Lab Results  Component Value Date   WBC 0.9 (LL) 11/16/2015   HGB 8.0 (L) 11/16/2015   HCT 24.2 (L) 11/16/2015   MCV 91.0 11/16/2015   PLT 88 (L) 11/16/2015    Lab Results  Component Value Date   CREATININE 0.62 11/16/2015   BUN 6 11/16/2015   NA 128 (L) 11/16/2015   K 3.5 11/16/2015   CL 95 (L) 11/16/2015   CO2 26 11/16/2015    Lab Results  Component Value Date   ALT 27 11/06/2015   AST 36 11/06/2015   ALKPHOS 76 11/06/2015     Microbiology: Recent Results (from the past 240 hour(s))  Culture, blood (Routine x 2)     Status: None   Collection Time: 11/06/15  8:30 PM  Result Value Ref Range Status   Specimen Description BLOOD LEFT ARM  Final   Special Requests IN PEDIATRIC BOTTLE 2CC  Final   Culture NO GROWTH 5 DAYS  Final   Report Status 11/11/2015 FINAL  Final  Culture, blood (Routine x 2)     Status: None   Collection Time: 11/06/15  8:45 PM  Result Value Ref Range Status   Specimen Description BLOOD LEFT ARM  Final   Special Requests BOTTLES DRAWN AEROBIC AND ANAEROBIC 4CC   Final   Culture NO GROWTH 5 DAYS  Final   Report Status 11/11/2015 FINAL  Final  Urine culture     Status: None   Collection Time: 11/06/15 10:36 PM  Result Value Ref Range Status   Specimen Description URINE, CATHETERIZED  Final   Special Requests NONE  Final   Culture NO GROWTH  Final    Report Status 11/08/2015 FINAL  Final  MRSA PCR Screening     Status: None   Collection Time: 11/09/15 11:54 AM  Result Value Ref Range Status   MRSA by PCR NEGATIVE NEGATIVE Final    Comment:        The GeneXpert MRSA Assay (FDA approved for NASAL specimens only), is one component of a comprehensive MRSA colonization surveillance program. It is not intended to diagnose MRSA infection nor to guide or monitor treatment for MRSA infections.   Culture, blood (routine x 2)     Status: None (Preliminary result)   Collection Time: 11/11/15  4:50 PM  Result Value Ref Range Status   Specimen Description BLOOD LEFT ANTECUBITAL  Final   Special Requests BOTTLES DRAWN AEROBIC ONLY 5CC  Final   Culture NO GROWTH 4 DAYS  Final   Report Status PENDING  Incomplete  Culture, blood (routine x 2)     Status: None (Preliminary result)   Collection Time: 11/11/15  4:55 PM  Result Value Ref Range Status   Specimen Description BLOOD BLOOD LEFT ARM  Final   Special Requests IN PEDIATRIC BOTTLE 1CC  Final   Culture NO GROWTH 4 DAYS  Final   Report Status PENDING  Incomplete  Acid Fast Smear (AFB)     Status: None   Collection Time: 11/11/15  8:11 PM  Result Value Ref Range Status   AFB Specimen Processing Concentration  Final    Comment: (NOTE) Performed At: [facility] [address] [place], [place] [id number] Mila Homer MD [id number]    Acid Fast Smear NOSMR  Final    Comment: (NOTE) The acid-fast bacilli (AFB) smear will not be performed due to the specimen source (blood) or submission of an insufficient quantity of specimen.    Source (AFB) BLOOD  Corrected    Impression/Plan:  1. Dysphagia - seems to be improving with fluconazole 400 mg.  Will continue 21 days total.  Can take it po but continuing IV for now with difficulty swallowing.  2. 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  CD4 and viral load sent today 3. 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  Last  day today.    4. Fever - may be iris, vs esophageal thrush. No other source.  MAI but culture so far negative.

## 2015-11-16 NOTE — Progress Notes (Signed)
Patient ID: Samantha Kline, female   DOB: [birth year], 55 y.o.   MRN: [id number]  PROGRESS NOTE    Sadaf Przybysz  WUJ:[id number] DOB: 09/13/1960 DOA: 11/06/2015  PCP: No PCP Per Patient   Brief Narrative:  55 y.o. female with past medical history significant for recently diagnosed HIV, recent hospitalization from 10/24/2015 through 10/31/2015 for new diagnosis of HIV pneumonia. Patient was subsequently discharged on Bactrim and prednisone with concerns for pneumocystis pneumonia. Apparently patient was doing well for couple of days after discharge but then started to decline with generalized weakness, loss of appetite, nausea, vomiting and confusion. Patient's family reported no blood with vomiting. No diarrhea. No fevers or chills.  In ED, patient was hemodynamically stable. Blood work was notable for sodium of 110, potassium 5.3, bicarbonate 18, glucose 161. Lactic acid was 2.65 but subsequently normalized. CT head showed no acute intracranial process. Chest x-ray showed no acute abnormalities.  Barrier to discharge: Ongoing fever, leukopenia and difficulty swallowing. ID following. Will follow recommendations and further clinical response. Hopefully able to discharge if stable in the next 24-48 hours or so.   Assessment & Plan: Acute metabolic encephalopathy / hypoosmolar hyponatremia - Likely secondary to severe hyponatremia. Sodium was as low as 110 on the admission - Hyponatremia presumed to be secondary to lungs infection and also likely because of Bactrim - No acute findings on CT head - Sodium 127-128 now and stable - will monitor   Fever of unknown origin - Ongoing fever despite multiple abx. Possible PCP infection, IRIS (last one favored) - Blood cultures on admission and repeat cultures has remained negative - Most recent CXR showed low lung volumes and opacity at the left base (no new infiltrates)  - Appreciate ID following and rec's  Oral thrush / Dysphagia: and presumed  esophageal candidiasis  - Continue fluconazole, dose increased per ID to 400 mg instead of 100 mg daily. 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   - For PCP prophylaxis she is on dapsone and for PCP infection she completed tx with prednisone, clindamycin and primaquine. - Continue azithromycin for MAC prophylaxis - will follow any further rec's from ID  Anemia of chronic disease - Hemoglobin ranges 8 - 8.5 range -no signs of acute bleeding -will follow trend   Leukopenia / ?neutropenia  - Likely from HIV  - no growth on further cx's - patient has completed active antibiotic treatment and currently is receiving treatment for HIV - will follow cell line trend   Hyperkalemia / Hypokalemia  - Secondary to Bactrim - She was given Kayexalate  - Repeated potassium within normal limits and then slightly low  - Supplemented and WNL today (7/26) - will follow electrolytes intermittently   Moderate protein calorie malnutrition -In the context of chronic illness -Continue ensure supplementation -advancing diet as tolerated   DVT prophylaxis: Stop Lovenox and use SCD's bilaterally due to drop in platelets over past 2 days 102 --> 79 Code Status: full code  Family Communication: no family at bedside Disposition Plan: continue spiking fever, assessing with ID for stability for  discharge. Plan is for CIR vs SNF   Consultants:   ID  SLP  GI, [facility], consulted 7/25  Procedures:   None   Antimicrobials:   HAART  Bactrim, stop 11/08/2015  Dapsone 11/08/2015  -->  Clindamycin 11/08/2015 -->  Primaquine 11/08/2015 -->  Fluconazole 11/11/2015 -->   Azithromycin    Subjective: No CP, no SOB. Continue spiking fever. On my exam AOX3, following commands and answering questions properly   Objective: Vitals:   11/16/15 1005 11/16/15 1254 11/16/15 1406 11/16/15 1840  BP: (!) 104/57 108/70 (!) 103/53   Pulse: (!) 105 94 (!) 113   Resp:  16 18   Temp: 99.2 F (37.3 C) 100 F (37.8 C) 98.7 F (37.1 C) (!) 100.8 F (38.2 C)  TempSrc: Oral  Oral Oral  SpO2: 92% 93% 96%   Weight:      Height:        Intake/Output Summary (Last 24 hours) at 11/16/15 1942 Last data filed at 11/16/15 1409  Gross per 24 hour  Intake              120 ml  Output                0 ml  Net              120 ml   Filed Weights   11/07/15 0900 11/16/15 0510  Weight: 52.8 kg (116 lb 4.8 oz) 60.6 kg (133 lb 11.2 oz)    Examination:  General exam: dry MM, no oral thrush appreciated, has continue spiking fever intermittently and was complaining of nausea and vomiting earlier.   Respiratory system: No wheezing, diminished breath sounds  Cardiovascular system: S1 & S2 (+), tachycardic  Gastrointestinal system: (+) BS, no tenderness, no distention  Central nervous system: Nonfocal  Extremities: No swelling, palpable pulses  Skin: warm, dry Psychiatry: normal mood and behavior  Data Reviewed: I have personally reviewed following labs and imaging studies  CBC:  Recent Labs Lab 11/13/15 0503 11/14/15 0547 11/15/15 1136 11/15/15 1318 11/16/15 0604  WBC 1.2* 1.3* 0.7* 0.7* 0.9*  NEUTROABS  --   --   --  0.4* 0.5*  HGB 8.1* 8.5* 8.0* 7.7* 8.0*  HCT 24.6* 25.6* 23.9* 23.9* 24.2*  MCV 89.8 90.1 90.5 91.9 91.0  PLT 102* 92* 79* 80* 88*   Basic Metabolic Panel:  Recent Labs Lab 11/12/15 0500 11/13/15 0503 11/14/15 0547 11/15/15 1136 11/16/15 0604  NA 128* 128* 127* 127* 128*  K 3.7 3.6 3.5 3.2* 3.5  CL 97* 97* 96* 95* 95*  CO2 27 27 25 26 26    GLUCOSE 161* 109* 120* 112* 109*  BUN 11 9 10  5* 6  CREATININE 0.60 0.61 0.56 0.60 0.62  CALCIUM 7.7* 7.7* 7.6* 7.6* 7.7*   GFR: Estimated Creatinine Clearance: 67.8 mL/min (by C-G formula based on SCr of 0.8 mg/dL).  Urine analysis:    Component Value Date/Time   COLORURINE YELLOW 11/06/2015 2236   APPEARANCEUR CLEAR 11/06/2015 2236   LABSPEC 1.029 11/06/2015 2236   PHURINE 6.0 11/06/2015 2236   GLUCOSEU NEGATIVE 11/06/2015 2236   HGBUR NEGATIVE 11/06/2015 2236   BILIRUBINUR NEGATIVE 11/06/2015 2236   KETONESUR NEGATIVE 11/06/2015 2236   PROTEINUR 30 (A) 11/06/2015 2236   NITRITE NEGATIVE 11/06/2015 2236   LEUKOCYTESUR NEGATIVE 11/06/2015 2236    Recent Results (from the past 240 hour(s))  Culture, blood (Routine x 2)     Status: None (Preliminary result)   Collection Time: 11/06/15  8:30 PM  Result Value Ref Range Status   Specimen Description BLOOD LEFT ARM  Final   Special Requests IN PEDIATRIC BOTTLE 2CC  Final   Culture NO GROWTH 3 DAYS  Final   Report Status PENDING  Incomplete  Culture, blood (Routine x 2)     Status: None (Preliminary result)   Collection Time: 11/06/15  8:45 PM  Result Value Ref Range Status   Specimen Description BLOOD LEFT ARM  Final   Special Requests BOTTLES DRAWN AEROBIC AND ANAEROBIC 4CC   Final   Culture NO GROWTH 3 DAYS  Final   Report Status PENDING  Incomplete  Urine culture     Status: None   Collection Time: 11/06/15 10:36 PM  Result Value Ref Range Status   Specimen Description URINE, CATHETERIZED  Final   Special Requests NONE  Final   Culture NO GROWTH  Final   Report Status 11/08/2015 FINAL  Final  MRSA PCR Screening     Status: None   Collection Time: 11/09/15 11:54 AM  Result Value Ref Range Status   MRSA by PCR NEGATIVE NEGATIVE Final      Radiology Studies: Dg Chest Port 1 View 11/11/2015 Low lung volumes. Subtle opacity at the left lung base, suspicious for pneumonia. Aortic atherosclerosis.   Dg Chest 1  View 11/06/2015  No acute abnormalities.   Ct Head Wo Contrast 11/07/2015  No acute intracranial process.    Marland Kitchen antiseptic oral rinse  7 mL Mouth Rinse BID  . azithromycin  1,200 mg Oral Q Fri  . clindamycin  600 mg Oral Q8H   And  . primaquine  30 mg Oral Daily  . dolutegravir  50 mg Oral Q1200  . emtricitabine-tenofovir AF  1 tablet Oral Q1200  . feeding supplement (ENSURE ENLIVE)  237 mL Oral TID BM  . ferrous sulfate  325 mg Oral Q breakfast  . fluconazole (DIFLUCAN) IV  400 mg Intravenous Q24H  . multivitamin with minerals  1 tablet Oral Daily  . sodium chloride flush  3 mL Intravenous Q12H  . vitamin B-12  100 mcg Oral Daily  . Vitamin D (Ergocalciferol)  50,000 Units Oral Q Fri     Continuous Infusions: . dextrose 5 % and 0.9% NaCl 75 mL/hr at 11/16/15 1219     LOS: 9 days    Time spent: 25 minutes    Vassie Loll, MD [facility] Pager [phone]  If 7PM-7AM, please contact night-coverage [email or web address] Password [facility] 11/16/2015, 7:42 PM

## 2015-11-16 NOTE — Progress Notes (Signed)
Physical Therapy Treatment Patient Details Name: Samantha Kline MRN: [id number] DOB: [birth year] Today's Date: 11/16/2015    History of Present Illness 55 y.o. female with medical history significant of recently diagnosed HIV, recently treated pneumonia, who presents with altered mental status, generalized weakness, nausea and vomiting.Head CT and CXR negative. 50 lb unintentional weight loss over the past 5 - 6 months along with decreased appetite.     PT Comments    Patient demonstrated decline in mobility since previous session on 11/14/15. Limited by fatigue/lethargy and session focused on bed mobility. Pt required max A for rolling and unaware of bowel incontinence upon therapy arrival. Continue to progress as tolerated.   Follow Up Recommendations  CIR;Supervision/Assistance - 24 hour     Equipment Recommendations  Other (comment)    Recommendations for Other Services OT consult     Precautions / Restrictions Precautions Precautions: Fall Restrictions Weight Bearing Restrictions: No    Mobility  Bed Mobility Overal bed mobility: Needs Assistance;+2 for physical assistance Bed Mobility: Rolling Rolling: Max assist         General bed mobility comments: assist to roll side to side X2 with multimodal cues for sequencing; pt incontinent of bowel upon arrival and unaware  Transfers                 General transfer comment: Not attempted at this time for pt and therapist safety as pt was too lethargic  Ambulation/Gait                 Stairs            Wheelchair Mobility    Modified Rankin (Stroke Patients Only)       Balance                                    Cognition Arousal/Alertness: Lethargic Behavior During Therapy: Flat affect Overall Cognitive Status: Difficult to assess                      Exercises      General Comments General comments (skin integrity, edema, etc.): assisted pt with oral care;  family members present end of session      Pertinent Vitals/Pain Pain Assessment: Faces Faces Pain Scale: Hurts even more Pain Location: buttocks (sacral wounds) during pericare Pain Descriptors / Indicators: Grimacing;Guarding;Sore Pain Intervention(s): Limited activity within patient's tolerance;Monitored during session;Repositioned    Home Living                      Prior Function            PT Goals (current goals can now be found in the care plan section) Acute Rehab PT Goals Patient Stated Goal: none stated Progress towards PT goals: Not progressing toward goals - comment    Frequency  Min 3X/week    PT Plan Current plan remains appropriate    Co-evaluation PT/OT/SLP Co-Evaluation/Treatment: Yes Reason for Co-Treatment: Complexity of the patient's impairments (multi-system involvement);For patient/therapist safety PT goals addressed during session: Mobility/safety with mobility OT goals addressed during session: ADL's and self-care     End of Session   Activity Tolerance: Patient limited by fatigue;Patient limited by lethargy Patient left: in bed;with call bell/phone within reach;with bed alarm set;with family/visitor present     Time: 4098-1191 PT Time Calculation (min) (ACUTE ONLY): 30 min  Charges:  $Therapeutic Activity: 8-22 mins  G Codes:      Derek Mound, PTA Pager: [phone]   11/16/2015, 4:52 PM

## 2015-11-16 NOTE — Progress Notes (Signed)
Noted pt unable to participate with therapies today. I will follow up tomorrow. [phone]

## 2015-11-16 NOTE — Progress Notes (Signed)
Occupational Therapy Treatment Patient Details Name: Samantha Kline MRN: [id number] DOB: May 31, 1960 Today's Date: 11/16/2015    History of present illness 55 y.o. female with medical history significant of recently diagnosed HIV, recently treated pneumonia, who presents with altered mental status, generalized weakness, nausea and vomiting.Head CT and CXR negative. 50 lb unintentional weight loss over the past 5 - 6 months along with decreased appetite.    OT comments  Pt limited by lethargy today and was unable to progress further than bed mobility. Pt was incontinent on OT/PTA arrival, but was no aware of this. Pt required max assist to roll side-side in bed and total assist for pericare, bathing and dressing tasks. Pt appeared to perk up after assisted with oral care and was able to keep eyes open and interact minimally with family members that arrived. Will continue to follow acutely.    Follow Up Recommendations  CIR;Supervision/Assistance - 24 hour    Equipment Recommendations  Wheelchair (measurements OT);Wheelchair cushion (measurements OT);Hospital bed    Recommendations for Other Services      Precautions / Restrictions Precautions Precautions: Fall Restrictions Weight Bearing Restrictions: No       Mobility Bed Mobility Overal bed mobility: Needs Assistance;+2 for physical assistance Bed Mobility: Rolling Rolling: Max assist         General bed mobility comments: Pt incontinent of bowel on OT arrival. Max assist to roll side-side in bed. Multi-modal cues for initiation and hand placement throughout.  Transfers                 General transfer comment: Not attempted at this time for pt and therapist safety as pt was too lethargic    Balance                                   ADL Overall ADL's : Needs assistance/impaired     Grooming: Oral care;Moderate assistance;Bed level Grooming Details (indicate cue type and reason): assist to place  [place] in hand and cues to bring to mouth; assist with sponge stick to clean out mouth Upper Body Bathing: Total assistance;Bed level   Lower Body Bathing: Maximal assistance;+2 for physical assistance;Bed level   Upper Body Dressing : Total assistance;Bed level   Lower Body Dressing: Maximal assistance;+2 for physical assistance;Bed level                 General ADL Comments: Pt very lethargic and required tactile cues to keep eyes open. Minimal verbal output until assisted with oral care. Afterwards, pt seemed a bit more alert and eyes open without cueing.      Vision                     Perception     Praxis      Cognition   Behavior During Therapy: Flat affect Overall Cognitive Status: Difficult to assess                       Extremity/Trunk Assessment               Exercises     Shoulder Instructions       General Comments      Pertinent Vitals/ Pain       Pain Assessment: Faces Faces Pain Scale: Hurts even more Pain Location: buttocks with pericare due to sacral wounds Pain Descriptors / Indicators: Sore Pain Intervention(s): Limited activity  within patient's tolerance;Monitored during session;Repositioned  Home Living                                          Prior Functioning/Environment              Frequency Min 2X/week     Progress Toward Goals  OT Goals(current goals can now be found in the care plan section)  Progress towards OT goals: Not progressing toward goals - comment (due to lethargy)  Acute Rehab OT Goals Patient Stated Goal: none stated OT Goal Formulation: Patient unable to participate in goal setting Time For Goal Achievement: 11/22/15 Potential to Achieve Goals: Good ADL Goals Pt Will Perform Eating: with supervision;sitting Pt Will Perform Grooming: with supervision;with set-up;sitting Pt Will Perform Upper Body Bathing: with min assist;sitting Pt Will Perform Lower Body  Bathing: with set-up;with supervision;sit to/from stand Pt Will Transfer to Toilet: with mod assist;stand pivot transfer;bedside commode Pt Will Perform Toileting - Clothing Manipulation and hygiene: with min guard assist;sit to/from stand Additional ADL Goal #1: Pt will sustain attention to familiar ADL task x 4 mins with min cues  Additional ADL Goal #2: Pt and family will be aware of energy conservation stratigies from handout that would be of benefit to her  Plan Discharge plan remains appropriate    Co-evaluation    PT/OT/SLP Co-Evaluation/Treatment: Yes Reason for Co-Treatment: Complexity of the patient's impairments (multi-system involvement);For patient/therapist safety   OT goals addressed during session: ADL's and self-care      End of Session     Activity Tolerance Patient limited by fatigue   Patient Left in bed;with bed alarm set;with call bell/phone within reach;with SCD's reapplied;with family/visitor present   Nurse Communication Mobility status        Time: 1534-1601 OT Time Calculation (min): 27 min  Charges: OT General Charges $OT Visit: 1 Procedure OT Treatments $Self Care/Home Management : 8-22 mins  Nils Pyle, OTR/L Pager: [phone] 11/16/2015, 4:23 PM

## 2015-11-17 ENCOUNTER — Encounter (HOSPITAL_COMMUNITY)
Admission: EM | Disposition: A | Discharge status: Rehabilitation Facility | Payer: Self-pay | Source: Home / Self Care | Attending: Internal Medicine

## 2015-11-17 ENCOUNTER — Inpatient Hospital Stay (HOSPITAL_COMMUNITY): Payer: Managed Care, Other (non HMO)

## 2015-11-17 DIAGNOSIS — R1310 Dysphagia, unspecified: Secondary | ICD-10-CM

## 2015-11-17 DIAGNOSIS — E876 Hypokalemia: Secondary | ICD-10-CM

## 2015-11-17 LAB — CBC WITH DIFFERENTIAL/PLATELET
Basophils Absolute: 0 10*3/uL (ref 0.0–0.1)
Basophils Relative: 3 %
EOS PCT: 3 %
Eosinophils Absolute: 0 10*3/uL (ref 0.0–0.7)
HEMATOCRIT: 24.2 % — AB (ref 36.0–46.0)
HEMOGLOBIN: 8.2 g/dL — AB (ref 12.0–15.0)
LYMPHS ABS: 0.3 10*3/uL — AB (ref 0.7–4.0)
Lymphocytes Relative: 30 %
MCH: 30.8 pg (ref 26.0–34.0)
MCHC: 33.9 g/dL (ref 30.0–36.0)
MCV: 91 fL (ref 78.0–100.0)
MONOS PCT: 18 %
Monocytes Absolute: 0.2 10*3/uL (ref 0.1–1.0)
NEUTROS PCT: 46 %
Neutro Abs: 0.5 10*3/uL — ABNORMAL LOW (ref 1.7–7.7)
Platelets: 90 10*3/uL — ABNORMAL LOW (ref 150–400)
RBC: 2.66 MIL/uL — AB (ref 3.87–5.11)
RDW: 18.6 % — ABNORMAL HIGH (ref 11.5–15.5)
WBC: 1 10*3/uL — AB (ref 4.0–10.5)

## 2015-11-17 LAB — BASIC METABOLIC PANEL
Anion gap: 6 (ref 5–15)
BUN: 6 mg/dL (ref 6–20)
CHLORIDE: 95 mmol/L — AB (ref 101–111)
CO2: 24 mmol/L (ref 22–32)
Calcium: 7.3 mg/dL — ABNORMAL LOW (ref 8.9–10.3)
Creatinine, Ser: 0.64 mg/dL (ref 0.44–1.00)
GFR calc Af Amer: >60 mL/min (ref >60)
GFR calc non Af Amer: >60 mL/min (ref >60)
Glucose, Bld: 132 mg/dL — ABNORMAL HIGH (ref 65–99)
POTASSIUM: 3.1 mmol/L — AB (ref 3.5–5.1)
SODIUM: 125 mmol/L — AB (ref 135–145)

## 2015-11-17 LAB — LACTIC ACID, PLASMA: Lactic Acid, Venous: 1.4 mmol/L (ref 0.5–1.9)

## 2015-11-17 LAB — T-HELPER CELLS (CD4) COUNT (NOT AT ARMC)
CD4 % Helper T Cell: 6 % — ABNORMAL LOW (ref 33–55)
CD4 T Cell Abs: 20 /uL — ABNORMAL LOW (ref 400–2700)

## 2015-11-17 LAB — HIV-1 RNA QUANT-NO REFLEX-BLD
HIV 1 RNA QUANT: 200 {copies}/mL
LOG10 HIV-1 RNA: 2.301 log10copy/mL

## 2015-11-17 SURGERY — EGD (ESOPHAGOGASTRODUODENOSCOPY)
Anesthesia: Moderate Sedation

## 2015-11-17 MED ORDER — POTASSIUM CHLORIDE 10 MEQ/100ML IV SOLN
10.0000 meq | INTRAVENOUS | Status: AC
  Administered 2015-11-17 (×2): 10 meq via INTRAVENOUS
  Filled 2015-11-17 (×2): qty 100

## 2015-11-17 MED ORDER — DRONABINOL 2.5 MG PO CAPS
2.5000 mg | ORAL_CAPSULE | Freq: Two times a day (BID) | ORAL | Status: DC
  Administered 2015-11-17 – 2015-11-25 (×14): 2.5 mg via ORAL
  Filled 2015-11-17 (×13): qty 1

## 2015-11-17 MED ORDER — ETHAMBUTOL HCL 400 MG PO TABS
15.0000 mg/kg | ORAL_TABLET | Freq: Every day | ORAL | Status: DC
  Administered 2015-11-17: 900 mg via ORAL
  Filled 2015-11-17 (×2): qty 1

## 2015-11-17 MED ORDER — CLARITHROMYCIN 500 MG PO TABS
500.0000 mg | ORAL_TABLET | Freq: Two times a day (BID) | ORAL | Status: DC
Start: 2015-11-17 — End: 2015-11-25
  Administered 2015-11-17 – 2015-11-25 (×14): 500 mg via ORAL
  Filled 2015-11-17 (×17): qty 1

## 2015-11-17 NOTE — Progress Notes (Signed)
Patient ID: Samantha Kline, female   DOB: 01-30-1961, 55 y.o.   MRN: [id number]  PROGRESS NOTE    Beryl Hornberger  WUJ:[id number] DOB: October 25, 1960 DOA: 11/06/2015  PCP: No PCP Per Patient   Brief Narrative:  55 y.o. female with past medical history significant for recently diagnosed HIV, recent hospitalization from 10/24/2015 through 10/31/2015 for new diagnosis of HIV pneumonia. Patient was subsequently discharged on Bactrim and prednisone with concerns for pneumocystis pneumonia. Apparently patient was doing well for couple of days after discharge but then started to decline with generalized weakness, loss of appetite, nausea, vomiting and confusion. Patient's family reported no blood with vomiting. No diarrhea. No fevers or chills.  In ED, patient was hemodynamically stable. Blood work was notable for sodium of 110, potassium 5.3, bicarbonate 18, glucose 161. Lactic acid was 2.65 but subsequently normalized. CT head showed no acute intracranial process. Chest x-ray showed no acute abnormalities.  Barrier to discharge: Ongoing fever, leukopenia and difficulty swallowing. ID following. Will follow recommendations. Patient continue Physically declining and with poor intake. Will follow clinical response. Unable to estimate stability for discharge currently.   Assessment & Plan: Acute metabolic encephalopathy / hypoosmolar hyponatremia - Likely secondary to severe hyponatremia. Sodium was as low as 110 on the admission - Hyponatremia presumed to be secondary to lungs infection and also likely because of Bactrim - No acute findings on CT head - Sodium 127-128 now and stable - will monitor   Fever of unknown origin - Ongoing fever despite multiple abx. 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 Biaxin and ethambutol. Appreciate their assistance.  Oral thrush / Dysphagia: and presumed esophageal candidiasis  - Continue fluconazole, dose increased per ID to 400 mg instead of 100 mg daily. 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   - completed treatment for PCP with prednisone, clindamycin and primaquine. - Continue azithromycin and Dapsone for prophylaxis - following ID rec's and giving ongoing fever and concerns for MAI, will be treated with biaxin and ethambutol   Anemia of chronic disease - Hemoglobin ranges 8 - 8.5 range -no signs of acute bleeding -will follow trend   Leukopenia / ?neutropenia  - Likely from HIV  - no growth on further cx's - patient has completed active antibiotic treatment and currently is receiving treatment for HIV - will follow cell line trend   Hyperkalemia / Hypokalemia  - Secondary to Bactrim - She was given Kayexalate  - Repeated potassium within normal limits and then slightly low  - will replete as needed; down to 3.1 on 7/27 (most likely from poor PO and GI loses) - will follow electrolytes intermittently   Moderate protein calorie malnutrition -In the context of chronic illness -Continue ensure supplementation -advancing diet as tolerated -started on megace    DVT prophylaxis: Stop Lovenox and use SCD's bilaterally due  to drop in platelets over past 2 days 102 --> 79 Code Status: full code  Family Communication: no family at bedside Disposition Plan: continue spiking fever, assessing with  ID for stability for discharge. Plan is for CIR vs SNF   Consultants:   ID  SLP  GI, [facility], consulted 7/25  Procedures:   None   Antimicrobials:   HAART  Dapsone 11/08/2015 -->  Clindamycin 11/08/2015 -->completed course   Primaquine 11/08/2015 -->completed course   Fluconazole 11/11/2015 --> planning to treat for 21 days total   Azithromycin   Biaxin 7/27>>>  Ethambutol 7/27>>>   Subjective: No CP, no SOB. Continue spiking fever. On my exam AOX3, following commands and answering questions properly. Patient is weak and very deconditioned. Having trouble to even participate with physical therapist currently.   Objective: Vitals:   11/17/15 0630 11/17/15 1021 11/17/15 1212 11/17/15 1459  BP: (!) 149/64   120/70  Pulse: 97   (!) 108  Resp: (!) 22   18  Temp: 98.3 F (36.8 C) (!) 101.5 F (38.6 C) (!) 101.8 F (38.8 C) 99.6 F (37.6 C)  TempSrc: Oral Oral Oral   SpO2: 100%   97%  Weight:      Height:       No intake or output data in the 24 hours ending 11/17/15 1729 Filed Weights   11/07/15 0900 11/16/15 0510  Weight: 52.8 kg (116 lb 4.8 oz) 60.6 kg (133 lb 11.2 oz)    Examination:  General exam: dry MM, no oral thrush appreciated, has continue spiking fever intermittently and overall not improving in her physical capacity or ability to tolerate rehabilitation.    Respiratory system: No wheezing, diminished breath sounds  Cardiovascular system: S1 & S2 (+), tachycardic  Gastrointestinal system: (+) BS, no tenderness, no distention  Central nervous system: Nonfocal  Extremities: No swelling, palpable pulses  Skin: warm, dry Psychiatry: normal mood and behavior  Data Reviewed: I have personally reviewed following labs and imaging studies  CBC:  Recent Labs Lab 11/14/15 0547 11/15/15 1136 11/15/15 1318 11/16/15 0604 11/17/15 0110  WBC 1.3* 0.7* 0.7* 0.9* 1.0*  NEUTROABS  --   --  0.4* 0.5* 0.5*  HGB 8.5* 8.0* 7.7* 8.0* 8.2*  HCT 25.6* 23.9*  23.9* 24.2* 24.2*  MCV 90.1 90.5 91.9 91.0 91.0  PLT 92* 79* 80* 88* 90*   Basic Metabolic Panel:  Recent Labs Lab 11/13/15 0503 11/14/15 0547 11/15/15 1136 11/16/15 0604 11/17/15 0110  NA 128* 127* 127* 128* 125*  K 3.6 3.5 3.2* 3.5 3.1*  CL 97* 96* 95* 95* 95*  CO2 27 25 26 26 24   GLUCOSE 109* 120* 112* 109* 132*  BUN 9 10 5* 6 6  CREATININE 0.61 0.56 0.60 0.62 0.64  CALCIUM 7.7* 7.6* 7.6* 7.7* 7.3*   GFR: Estimated Creatinine Clearance: 67.8 mL/min (by C-G formula based on SCr of 0.8 mg/dL).  Urine analysis:    Component Value Date/Time   COLORURINE YELLOW 11/06/2015 2236   APPEARANCEUR CLEAR 11/06/2015 2236   LABSPEC 1.029 11/06/2015 2236   PHURINE 6.0 11/06/2015 2236   GLUCOSEU NEGATIVE 11/06/2015 2236   HGBUR NEGATIVE 11/06/2015 2236   BILIRUBINUR NEGATIVE 11/06/2015 2236   KETONESUR NEGATIVE 11/06/2015 2236   PROTEINUR 30 (A) 11/06/2015 2236   NITRITE NEGATIVE 11/06/2015 2236   LEUKOCYTESUR NEGATIVE 11/06/2015 2236    Recent Results (from the past 240 hour(s))  Culture, blood (Routine x 2)     Status: None (Preliminary result)   Collection  Time: 11/06/15  8:30 PM  Result Value Ref Range Status   Specimen Description BLOOD LEFT ARM  Final   Special Requests IN PEDIATRIC BOTTLE 2CC  Final   Culture NO GROWTH 3 DAYS  Final   Report Status PENDING  Incomplete  Culture, blood (Routine x 2)     Status: None (Preliminary result)   Collection Time: 11/06/15  8:45 PM  Result Value Ref Range Status   Specimen Description BLOOD LEFT ARM  Final   Special Requests BOTTLES DRAWN AEROBIC AND ANAEROBIC 4CC   Final   Culture NO GROWTH 3 DAYS  Final   Report Status PENDING  Incomplete  Urine culture     Status: None   Collection Time: 11/06/15 10:36 PM  Result Value Ref Range Status   Specimen Description URINE, CATHETERIZED  Final   Special Requests NONE  Final   Culture NO GROWTH  Final   Report Status 11/08/2015 FINAL  Final  MRSA PCR Screening     Status: None     Collection Time: 11/09/15 11:54 AM  Result Value Ref Range Status   MRSA by PCR NEGATIVE NEGATIVE Final      Radiology Studies: Dg Chest Port 1 View 11/11/2015 Low lung volumes. Subtle opacity at the left lung base, suspicious for pneumonia. Aortic atherosclerosis.   Dg Chest 1 View 11/06/2015  No acute abnormalities.   Ct Head Wo Contrast 11/07/2015  No acute intracranial process.    Marland Kitchen antiseptic oral rinse  7 mL Mouth Rinse BID  . clarithromycin  500 mg Oral Q12H  . dolutegravir  50 mg Oral Q1200  . dronabinol  2.5 mg Oral BID AC  . emtricitabine-tenofovir AF  1 tablet Oral Q1200  . ethambutol  15 mg/kg Oral Daily  . feeding supplement (ENSURE ENLIVE)  237 mL Oral TID BM  . ferrous sulfate  325 mg Oral Q breakfast  . fluconazole (DIFLUCAN) IV  400 mg Intravenous Q24H  . multivitamin with minerals  1 tablet Oral Daily  . sodium chloride flush  3 mL Intravenous Q12H  . vitamin B-12  100 mcg Oral Daily  . Vitamin D (Ergocalciferol)  50,000 Units Oral Q Fri     Continuous Infusions: . dextrose 5 % and 0.9% NaCl 75 mL/hr at 11/17/15 1310     LOS: 10 days    Time spent: 25 minutes    Vassie Loll, MD [facility] Pager [phone]  If 7PM-7AM, please contact night-coverage [email or web address] Password [facility] 11/17/2015, 5:29 PM

## 2015-11-17 NOTE — Progress Notes (Addendum)
Physical Therapy Treatment Patient Details Name: Samantha Kline MRN: [id number] DOB: [birth year] Today's Date: 11/17/2015    History of Present Illness 55 y.o. female with medical history significant of recently diagnosed HIV, recently treated pneumonia, who presents with altered mental status, generalized weakness, nausea and vomiting.Head CT and CXR negative. 50 lb unintentional weight loss over the past 5 - 6 months along with decreased appetite.     PT Comments    Noted pt had fever earlier today. Pt agreeable to working with PT. Max +2 assist for bed mobility. Sat EOB ~3-4 minutes with Mod-Max assist for static balance. Noted pt to keep wrists and fingers flexed at rest. She is able to extend both with cueing. Pt remains very weak. She stated today that she "feels miserable." Husband present during session-he is concerned she is declining. Encouraged husband to speak with MD(s) about current condition. Will continue to follow and progress activity as able.    Follow Up Recommendations  CIR     Equipment Recommendations   (TBD)    Recommendations for Other Services OT consult     Precautions / Restrictions Precautions Precautions: Fall Restrictions Weight Bearing Restrictions: No    Mobility  Bed Mobility Overal bed mobility: Needs Assistance Bed Mobility: Rolling;Supine to Sit;Sit to Supine Rolling: Max assist   Supine to sit: Max assist;+2 for physical assistance;+2 for safety/equipment   Sit to supine: Max assist;+2 for physical assistance;+2 for safety/equipment General bed mobility comments: Assist to roll for hygiene/replace linen-bowel and bladder incontinence. Assist for trunk and bil LEs for supine<>sit. Utilized bedpad for positioning.   Transfers                 General transfer comment: Pt unable at this time  Ambulation/Gait                 Stairs            Wheelchair Mobility    Modified Rankin (Stroke Patients Only)        Balance Overall balance assessment: Needs assistance Sitting-balance support: Feet supported Sitting balance-Leahy Scale: Poor Sitting balance - Comments: Poor sitting balance. Cues for pt to use UEs to aid with stabilization-pt attempted to comply but unsuccesful Postural control: Posterior lean                          Cognition Arousal/Alertness: Awake/alert Behavior During Therapy: Flat affect Overall Cognitive Status: Difficult to assess Area of Impairment: Following commands;Problem solving;Orientation Orientation Level: Disoriented to;Time;Situation     Following Commands: Follows one step commands consistently Safety/Judgement: Decreased awareness of safety   Problem Solving: Slow processing;Decreased initiation;Difficulty sequencing;Requires verbal cues;Requires tactile cues General Comments: slow processing. Appears improved from last visit    Exercises      General Comments        Pertinent Vitals/Pain Pain Assessment: Faces Faces Pain Scale: Hurts even more Pain Location: pt did not state besides "I feel miserable" Pain Descriptors / Indicators: Grimacing;Guarding;Sore Pain Intervention(s): Limited activity within patient's tolerance;Monitored during session;Repositioned    Home Living                      Prior Function            PT Goals (current goals can now be found in the care plan section) Progress towards PT goals: Not progressing toward goals - comment    Frequency  Min 3X/week    PT Plan Current  plan remains appropriate    Co-evaluation             End of Session   Activity Tolerance: Patient limited by fatigue Patient left: in bed;with call bell/phone within reach;with bed alarm set;with family/visitor present     Time: 2395-3202 PT Time Calculation (min) (ACUTE ONLY): 18 min  Charges:  $Therapeutic Activity: 8-22 mins                    G Codes:      Rebeca Alert, MPT Pager: [phone]

## 2015-11-17 NOTE — Progress Notes (Signed)
Speech Language Pathology Treatment: Dysphagia  Patient Details Name: Samantha Kline MRN: [id number] DOB: 1961-02-24 Today's Date: 11/17/2015 Time: 5993-5701 SLP Time Calculation (min) (ACUTE ONLY): 25 min  Assessment / Plan / Recommendation Clinical Impression  Dysphagia treatment provided for diet tolerance check. Pt upright in bed upon arrival, awake/ alert, answering some questions and able to follow 1-step commands, low vocal intensity and difficult to understand at times, occasional confusion. Pt had pocketed dark Percifield/ black secretions in R buccal cavity which was suctioned. Informed RN who felt it could be regurgitated meds from this a.m. Pt tolerated trials of thin liquids by straw without overt s/s of aspiration. Initially did well with trials of puree consistency, then held a bolus in mouth ~10 seconds followed by immediate cough prior to swallow initiation; bolus was suctioned. RN reported pt has not consumed meals today. Recommend downgrading diet again to full liquid, meds whole or crushed in puree, full supervision to assist with feeding, provide POs only when alert, small bites/ sips, provide oral care before and after meals. Will continue to follow closely for diet tolerance/ consider advancement.    HPI HPI: Samantha Kline is a 55 y.o. female with medical history significant of recently diagnosed HIV, recently treated pneumonia, who presents with altered mental status, generalized weakness, nausea and vomiting.Head CT and CXR negative. 50 lb unintentional weight loss over the past 5 - 6 months along with decreased appetite.  Pt underwent MBS yesterday and tody is seen for tolerance, readiness for advancement.       SLP Plan  Continue with current plan of care     Recommendations  Diet recommendations: Thin liquid;Other(comment) (full liquid) Liquids provided via: Cup;Straw Medication Administration: Whole meds with puree Supervision: Full supervision/cueing for compensatory  strategies Compensations: Slow rate;Small sips/bites;Follow solids with liquid Postural Changes and/or Swallow Maneuvers: Seated upright 90 degrees;Upright 30-60 min after meal             Oral Care Recommendations: Oral care before and after PO Follow up Recommendations: Skilled Nursing facility;Inpatient Rehab Plan: Continue with current plan of care     GO                [place]ung, [place], CCC-SLP 11/17/2015, 3:22 PM [phone]

## 2015-11-17 NOTE — Progress Notes (Signed)
I met with pt and her spouse at bedside. SLP, CNA and RN also working with pt. I explained to pt's spouse the reasoning behind her protective isolation sign on her door. He verbalized understanding. Pt has had a functional decline in abilities to work with therapies this week. Not at a functional level yet to be able to tolerate more intense therapies. I will follow her case. [phone]

## 2015-11-17 NOTE — Progress Notes (Signed)
[facility]   Reason for visit: Follow up on HIV, fever to 103  Interval History: still with fever.  Seems to be doing worse, not active at all.    Physical Exam: Constitutional:  Vitals:   11/17/15 1212 11/17/15 1459  BP:  120/70  Pulse:  (!) 108  Resp:  18  Temp: (!) 101.8 F (38.8 C) 99.6 F (37.6 C)   patient appears in NAD; chronically ill appearing HENT: opens mouth more, no thrush noted Respiratory: Normal respiratory effort; CTA B Cardiovascular: RRR  Review of Systems: Constitutional: positive for anorexia or negative for chills Gastrointestinal: negative for diarrhea  Lab Results  Component Value Date   WBC 1.0 (LL) 11/17/2015   HGB 8.2 (L) 11/17/2015   HCT 24.2 (L) 11/17/2015   MCV 91.0 11/17/2015   PLT 90 (L) 11/17/2015    Lab Results  Component Value Date   CREATININE 0.64 11/17/2015   BUN 6 11/17/2015   NA 125 (L) 11/17/2015   K 3.1 (L) 11/17/2015   CL 95 (L) 11/17/2015   CO2 24 11/17/2015    Lab Results  Component Value Date   ALT 27 11/06/2015   AST 36 11/06/2015   ALKPHOS 76 11/06/2015     Microbiology: Recent Results (from the past 240 hour(s))  MRSA PCR Screening     Status: None   Collection Time: 11/09/15 11:54 AM  Result Value Ref Range Status   MRSA by PCR NEGATIVE NEGATIVE Final    Comment:        The GeneXpert MRSA Assay (FDA approved for NASAL specimens only), is one component of a comprehensive MRSA colonization surveillance program. It is not intended to diagnose MRSA infection nor to guide or monitor treatment for MRSA infections.   Culture, blood (routine x 2)     Status: None   Collection Time: 11/11/15  4:50 PM  Result Value Ref Range Status   Specimen Description BLOOD LEFT ANTECUBITAL  Final   Special Requests BOTTLES DRAWN AEROBIC ONLY 5CC  Final   Culture NO GROWTH 5 DAYS  Final   Report Status 11/16/2015 FINAL  Final  Culture, blood (routine x 2)     Status: None   Collection  Time: 11/11/15  4:55 PM  Result Value Ref Range Status   Specimen Description BLOOD BLOOD LEFT ARM  Final   Special Requests IN PEDIATRIC BOTTLE 1CC  Final   Culture NO GROWTH 5 DAYS  Final   Report Status 11/16/2015 FINAL  Final  Acid Fast Smear (AFB)     Status: None   Collection Time: 11/11/15  8:11 PM  Result Value Ref Range Status   AFB Specimen Processing Concentration  Final    Comment: (NOTE) Performed At: [facility] [address] [place], [place] [id number] Mila Homer MD UX:[id number]    Acid Fast Smear NOSMR  Final    Comment: (NOTE) The acid-fast bacilli (AFB) smear will not be performed due to the specimen source (blood) or submission of an insufficient quantity of specimen.    Source (AFB) BLOOD  Corrected    Impression/Plan:  1. Dysphagia - seems to be improving with fluconazole 400 mg.  Will continue 21 days total.  Can take it po but continuing IV for now with difficulty swallowing.  2. 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  CD4 and viral load sent today 3. PCP - completed treatment 4. Fever - may be iris, vs esophageal thrush. No other source.  MAI but culture so far negative.   With continued fever, I am going to start empiric disseminated MAI treatment with clarithromycin and ethambutol. 5. Wasting - no appetite - I will try appetite stimulant

## 2015-11-18 DIAGNOSIS — B20 Human immunodeficiency virus [HIV] disease: Secondary | ICD-10-CM

## 2015-11-18 DIAGNOSIS — R64 Cachexia: Secondary | ICD-10-CM

## 2015-11-18 DIAGNOSIS — R509 Fever, unspecified: Secondary | ICD-10-CM

## 2015-11-18 LAB — CBC WITH DIFFERENTIAL/PLATELET
BASOS ABS: 0 10*3/uL (ref 0.0–0.1)
Basophils Relative: 1 %
EOS ABS: 0.1 10*3/uL (ref 0.0–0.7)
Eosinophils Relative: 4 %
HCT: 22.7 % — ABNORMAL LOW (ref 36.0–46.0)
HEMOGLOBIN: 7.6 g/dL — AB (ref 12.0–15.0)
LYMPHS ABS: 0.4 10*3/uL — AB (ref 0.7–4.0)
Lymphocytes Relative: 23 %
MCH: 30.6 pg (ref 26.0–34.0)
MCHC: 33.5 g/dL (ref 30.0–36.0)
MCV: 91.5 fL (ref 78.0–100.0)
Monocytes Absolute: 0.2 10*3/uL (ref 0.1–1.0)
Monocytes Relative: 10 %
NEUTROS ABS: 0.9 10*3/uL — AB (ref 1.7–7.7)
Neutrophils Relative %: 62 %
PLATELETS: 115 10*3/uL — AB (ref 150–400)
RBC: 2.48 MIL/uL — ABNORMAL LOW (ref 3.87–5.11)
RDW: 18.4 % — ABNORMAL HIGH (ref 11.5–15.5)
WBC: 1.6 10*3/uL — ABNORMAL LOW (ref 4.0–10.5)

## 2015-11-18 MED ORDER — ENSURE ENLIVE PO LIQD
237.0000 mL | Freq: Four times a day (QID) | ORAL | Status: DC
  Administered 2015-11-18 – 2015-11-25 (×17): 237 mL via ORAL

## 2015-11-18 MED ORDER — ETHAMBUTOL HCL 400 MG PO TABS
900.0000 mg | ORAL_TABLET | Freq: Every day | ORAL | Status: DC
  Administered 2015-11-18 – 2015-11-25 (×6): 900 mg via ORAL
  Filled 2015-11-18 (×10): qty 1

## 2015-11-18 NOTE — Progress Notes (Signed)
[facility]   Reason for visit: Follow up on HIV, fever to 101.8  Interval History: still with fever.  Stable now.    Physical Exam: Constitutional:  Vitals:   11/18/15 0546 11/18/15 1355  BP: 132/71 104/65  Pulse: (!) 110 (!) 104  Resp: 18 18  Temp: 100 F (37.8 C) (!) 100.6 F (38.1 C)   patient appears in NAD; chronically ill appearing HENT: opens mouth more, no thrush noted Respiratory: Normal respiratory effort; CTA B Cardiovascular: RRR  Review of Systems: Constitutional: positive for anorexia or negative for chills Gastrointestinal: negative for diarrhea  Lab Results  Component Value Date   WBC 1.6 (L) 11/18/2015   HGB 7.6 (L) 11/18/2015   HCT 22.7 (L) 11/18/2015   MCV 91.5 11/18/2015   PLT 115 (L) 11/18/2015    Lab Results  Component Value Date   CREATININE 0.64 11/17/2015   BUN 6 11/17/2015   NA 125 (L) 11/17/2015   K 3.1 (L) 11/17/2015   CL 95 (L) 11/17/2015   CO2 24 11/17/2015    Lab Results  Component Value Date   ALT 27 11/06/2015   AST 36 11/06/2015   ALKPHOS 76 11/06/2015     Microbiology: Recent Results (from the past 240 hour(s))  MRSA PCR Screening     Status: None   Collection Time: 11/09/15 11:54 AM  Result Value Ref Range Status   MRSA by PCR NEGATIVE NEGATIVE Final    Comment:        The GeneXpert MRSA Assay (FDA approved for NASAL specimens only), is one component of a comprehensive MRSA colonization surveillance program. It is not intended to diagnose MRSA infection nor to guide or monitor treatment for MRSA infections.   Culture, blood (routine x 2)     Status: None   Collection Time: 11/11/15  4:50 PM  Result Value Ref Range Status   Specimen Description BLOOD LEFT ANTECUBITAL  Final   Special Requests BOTTLES DRAWN AEROBIC ONLY 5CC  Final   Culture NO GROWTH 5 DAYS  Final   Report Status 11/16/2015 FINAL  Final  Culture, blood (routine x 2)     Status: None   Collection Time: 11/11/15   4:55 PM  Result Value Ref Range Status   Specimen Description BLOOD BLOOD LEFT ARM  Final   Special Requests IN PEDIATRIC BOTTLE 1CC  Final   Culture NO GROWTH 5 DAYS  Final   Report Status 11/16/2015 FINAL  Final  Acid Fast Smear (AFB)     Status: None   Collection Time: 11/11/15  8:11 PM  Result Value Ref Range Status   AFB Specimen Processing Concentration  Final    Comment: (NOTE) Performed At: [facility] [address] [place], [place] [id number] Mila Homer MD WU:[id number]    Acid Fast Smear NOSMR  Final    Comment: (NOTE) The acid-fast bacilli (AFB) smear will not be performed due to the specimen source (blood) or submission of an insufficient quantity of specimen.    Source (AFB) BLOOD  Corrected    Impression/Plan:  1. Dysphagia - seems to be improving with fluconazole 400 mg.  Will continue 21 days total.  Can take it po but continuing IV for now with difficulty swallowing.  2. 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  Viral load improving.  3. PCP - completed treatment 4. Fever - may be iris, vs esophageal thrush. No other source.  MAI but culture so far negative.  With continued fever, I have started empiric disseminated MAI treatment with clarithromycin and ethambutol. 5. Wasting - no appetite, on marinol.   Dr. Drue Second to follow up over the weekend

## 2015-11-18 NOTE — Progress Notes (Signed)
Occupational Therapy Treatment Patient Details Name: Samantha Kline MRN: [id number] DOB: 03-Nov-1960 Today's Date: 11/18/2015    History of present illness 55 y.o. female with medical history significant of recently diagnosed HIV, recently treated pneumonia, who presents with altered mental status, generalized weakness, nausea and vomiting.Head CT and CXR negative. 50 lb unintentional weight loss over the past 5 - 6 months along with decreased appetite.    OT comments  Pt with significant functional decline as compared to last OT session. Today, pt required Max A with bed mobility and stand pivot transfers to chair (+2 for safety). Pt unable to take any steps with RLE. Pt required Min A to maintain sitting balance EOB. HR 135 with stand pivot transfer. Due to fatigue, required Max A with UB bathing and was incontinent of BM during session.  At this time, pt is not able to tolerate CIR level therapy. Recommend Palliative Medicine Consult. Will continue to follow acutely and re-establish goals next session as goals need to be down graded.   Recommend nsg transfer pt using Stedy.     Follow Up Recommendations  Supervision/Assistance - 24 hour    Equipment Recommendations  Wheelchair (measurements OT);Wheelchair cushion (measurements OT);Hospital bed    Recommendations for Other Services  Palliative Medicine Consult    Precautions / Restrictions Precautions Precautions: Fall       Mobility Bed Mobility Overal bed mobility: Needs Assistance Bed Mobility: Supine to Sit Rolling: Max assist         General bed mobility comments: Increased assistance to roll and sit up in bed. Pt unable to push self up in bed  Transfers Overall transfer level: Needs assistance   Transfers: Sit to/from Stand;Stand Pivot Transfers Sit to Stand: Mod assist Stand pivot transfers: Max assist            Balance Overall balance assessment: Needs assistance   Sitting balance-Leahy Scale:  Poor Sitting balance - Comments: posterior bias     Standing balance-Leahy Scale: Zero                     ADL Overall ADL's : Needs assistance/impaired Eating/Feeding: Supervision/ safety Eating/Feeding Details (indicate cue type and reason): Pt has difficulty bringing food to mouth due to weakness     Upper Body Bathing: Maximal assistance               Toilet Transfer: Maximal assistance     Toileting - Clothing Manipulation Details (indicate cue type and reason): Pt incontinenet if BM     Functional mobility during ADLs: Maximal assistance General ADL Comments: Decline      Vision                     Perception     Praxis      Cognition   Behavior During Therapy: Flat affect Overall Cognitive Status: Impaired/Different from baseline Area of Impairment: Problem solving;Attention;Memory Orientation Level: Disoriented to;Time Current Attention Level: Selective Memory: Decreased short-term memory  Following Commands: Follows one step commands consistently     Problem Solving: Slow processing;Difficulty sequencing General Comments: Increased time requried for processing today    Extremity/Trunk Assessment     Generalized weakness BUE. Progressively weaker. Achieves @ 90 degrees FF BUE.           Exercises General Exercises - Upper Extremity Shoulder Flexion: AROM;Both;10 reps;Seated Elbow Flexion: AROM;Both;10 reps;Seated Elbow Extension: AROM;Both;10 reps;Seated Other Exercises Other Exercises: Given level 1 theraband to complete wtih general BUE  strengthening ex as tolerated - husband educated   Shoulder Instructions       General Comments  Pt asking to lie back down after sitting 5 min due to fatigue. Encouraged to sit OOB in chair.    Pertinent Vitals/ Pain       Pain Assessment: No/denies pain  Home Living                                          Prior Functioning/Environment               Frequency Min 2X/week     Progress Toward Goals  OT Goals(current goals can now be found in the care plan section)  Progress towards OT goals: Not progressing toward goals - comment;OT to reassess next treatment  Acute Rehab OT Goals Patient Stated Goal: to get better OT Goal Formulation: With patient Time For Goal Achievement: 11/22/15 Potential to Achieve Goals: Good ADL Goals Pt Will Perform Eating: with supervision;sitting Pt Will Perform Grooming: with supervision;with set-up;sitting Pt Will Perform Upper Body Bathing: with min assist;sitting Pt Will Perform Lower Body Bathing: with set-up;with supervision;sit to/from stand Pt Will Transfer to Toilet: with mod assist;stand pivot transfer;bedside commode Pt Will Perform Toileting - Clothing Manipulation and hygiene: with min guard assist;sit to/from stand Additional ADL Goal #1: Pt will sustain attention to familiar ADL task x 4 mins with min cues  Additional ADL Goal #2: Pt and family will be aware of energy conservation stratigies from handout that would be of benefit to her  Plan Discharge plan needs to be updated    Co-evaluation                 End of Session Equipment Utilized During Treatment: Gait belt   Activity Tolerance Patient limited by fatigue   Patient Left in chair;with call bell/phone within reach;with chair alarm set;with family/visitor present   Nurse Communication Mobility status  Use stedy for transfers        Time: 1500-1525 OT Time Calculation (min): 25 min  Charges: OT General Charges $OT Visit: 1 Procedure OT Treatments $Self Care/Home Management : 23-37 mins  Randale Carvalho,HILLARY 11/18/2015, 3:18 PM  [facility], OT/L  [phone] 11/18/2015

## 2015-11-18 NOTE — Progress Notes (Signed)
Patient ID: Samantha Kline, female   DOB: 1961/02/17, 55 y.o.   MRN: [id number]  PROGRESS NOTE    Samantha Kline  ZDG:[id number] DOB: December 27, 1960 DOA: 11/06/2015  PCP: No PCP Per Patient   Brief Narrative:  55 y.o. female with past medical history significant for recently diagnosed HIV, recent hospitalization from 10/24/2015 through 10/31/2015 for new diagnosis of HIV pneumonia. Patient was subsequently discharged on Bactrim and prednisone with concerns for pneumocystis pneumonia. Apparently patient was doing well for couple of days after discharge but then started to decline with generalized weakness, loss of appetite, nausea, vomiting and confusion. Patient's family reported no blood with vomiting. No diarrhea. No fevers or chills.  In ED, patient was hemodynamically stable. Blood work was notable for sodium of 110, potassium 5.3, bicarbonate 18, glucose 161. Lactic acid was 2.65 but subsequently normalized. CT head showed no acute intracranial process. Chest x-ray showed no acute abnormalities.  Barrier to discharge: Ongoing fever, leukopenia and difficulty swallowing. ID following. Will follow recommendations. Patient continue Physically declining and with poor intake. Will follow clinical response. Unable to estimate stability for discharge currently.   Assessment & Plan: Acute metabolic encephalopathy / hypoosmolar hyponatremia -Likely secondary to severe hyponatremia. Sodium was as low as 110 on the admission -Hyponatremia presumed to be secondary to lungs infection and also likely because of Bactrim -No acute findings on CT head -Sodium 125-128 now and stable -will monitor intermittently   Fever of unknown origin - Ongoing fever despite multiple abx. Possible PCP infection, [facility] (last one favored) - Blood cultures on admission and repeat cultures has remained negative - Most recent CXR showed low lung volumes and opacity at the left base (no new infiltrates)  -treatment empirically for  MAI to be started today as per ID rec's. Biaxin and ethambutol. Appreciate their assistance.  Oral thrush / Dysphagia: and presumed esophageal candidiasis  - Continue fluconazole, dose increased per ID to 400 mg instead of 100 mg daily. 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   - completed treatment for PCP with prednisone, clindamycin and primaquine. - Continue Dapsone for prophylaxis - following ID rec's and giving ongoing fever and concerns for MAI, will be treated with biaxin and ethambutol   Anemia of chronic disease - Hemoglobin ranges 8 - 8.5 range -no signs of acute bleeding -will follow trend   Leukopenia / ?neutropenia  - Likely from HIV  - no growth on further cx's - patient has completed active antibiotic treatment and currently is receiving treatment for HIV - will follow cell line trend   Hyperkalemia / Hypokalemia  - Secondary to Bactrim - She was given Kayexalate  - Repeated potassium within normal limits and then slightly low  - will replete as needed (most likely from poor PO and GI loses) - will follow electrolytes intermittently   Moderate protein calorie malnutrition -In the context of chronic illness -Continue ensure supplementation -advancing diet as tolerated -started on megace    DVT prophylaxis: Stop Lovenox and use SCD's bilaterally due to drop in platelets over past 2 days 102 --> 79  Code Status: full code  Family Communication: no family at bedside Disposition Plan: continue spiking fever, assessing with ID for stability for  discharge. Plan is for CIR vs SNF   Consultants:   ID  SLP  GI, [facility], consulted 7/25  Procedures:   None   Antimicrobials:   HAART  Dapsone 11/08/2015 -->  Clindamycin 11/08/2015 -->completed course   Primaquine 11/08/2015 -->completed course   Fluconazole 11/11/2015 --> planning to treat for 21 days total   Azithromycin >>>stopped on 7/27 after treatment for MAI started   Biaxin 7/27>>>  Ethambutol 7/27>>>   Subjective: No CP, no SOB. Continue spiking fever. On my exam AOX3, following commands and answering questions properly. Patient is frail and chronically ill in appearance.  Objective: Vitals:   11/17/15 2325 11/18/15 0132 11/18/15 0546 11/18/15 1355  BP: 116/63  132/71 104/65  Pulse: 98  (!) 110 (!) 104  Resp: 20  18 18   Temp: (!) 101.4 F (38.6 C) (!) 100.5 F (38.1 C) 100 F (37.8 C) (!) 100.6 F (38.1 C)  TempSrc: Oral Oral Oral   SpO2: 95%  100% 95%  Weight:      Height:        Intake/Output Summary (Last 24 hours) at 11/18/15 1710 Last data filed at 11/18/15 1417  Gross per 24 hour  Intake                0 ml  Output                0 ml  Net                0 ml   Filed Weights   11/07/15 0900 11/16/15 0510  Weight: 52.8 kg (116 lb 4.8 oz) 60.6 kg (133 lb 11.2 oz)    Examination:  General exam: dry MM, no oral thrush appreciated, has continue spiking fever intermittently and overall not improving much.     Respiratory system: No wheezing, diminished breath sounds  Cardiovascular system: S1 & S2 (+), tachycardic  Gastrointestinal system: (+) BS, no tenderness, no distention  Central nervous system: Nonfocal  Extremities: No swelling, palpable pulses  Skin: warm, dry Psychiatry: normal mood and behavior  Data Reviewed: I have personally reviewed following labs and imaging studies  CBC:  Recent Labs Lab 11/15/15 1136 11/15/15 1318 11/16/15 0604 11/17/15 0110 11/18/15 0522  WBC 0.7* 0.7* 0.9* 1.0* 1.6*  NEUTROABS  --  0.4*  0.5* 0.5* 0.9*  HGB 8.0* 7.7* 8.0* 8.2* 7.6*  HCT 23.9* 23.9* 24.2* 24.2* 22.7*  MCV 90.5 91.9 91.0 91.0 91.5  PLT 79* 80* 88* 90* 115*   Basic Metabolic Panel:  Recent Labs Lab 11/13/15 0503 11/14/15 0547 11/15/15 1136 11/16/15 0604 11/17/15 0110  NA 128* 127* 127* 128* 125*  K 3.6 3.5 3.2* 3.5 3.1*  CL 97* 96* 95* 95* 95*  CO2 27 25 26 26 24   GLUCOSE 109* 120* 112* 109* 132*  BUN 9 10 5* 6 6  CREATININE 0.61 0.56 0.60 0.62 0.64  CALCIUM 7.7* 7.6* 7.6* 7.7* 7.3*   GFR: Estimated Creatinine Clearance: 67.8 mL/min (by C-G formula based on SCr of 0.8 mg/dL).  Urine analysis:    Component Value Date/Time   COLORURINE YELLOW 11/06/2015 2236   APPEARANCEUR CLEAR 11/06/2015 2236   LABSPEC 1.029 11/06/2015 2236   PHURINE 6.0 11/06/2015 2236   GLUCOSEU NEGATIVE 11/06/2015 2236   HGBUR NEGATIVE 11/06/2015 2236   BILIRUBINUR NEGATIVE 11/06/2015 2236   KETONESUR NEGATIVE 11/06/2015  2236   PROTEINUR 30 (A) 11/06/2015 2236   NITRITE NEGATIVE 11/06/2015 2236   LEUKOCYTESUR NEGATIVE 11/06/2015 2236    Recent Results (from the past 240 hour(s))  Culture, blood (Routine x 2)     Status: None (Preliminary result)   Collection Time: 11/06/15  8:30 PM  Result Value Ref Range Status   Specimen Description BLOOD LEFT ARM  Final   Special Requests IN PEDIATRIC BOTTLE 2CC  Final   Culture NO GROWTH 3 DAYS  Final   Report Status PENDING  Incomplete  Culture, blood (Routine x 2)     Status: None (Preliminary result)   Collection Time: 11/06/15  8:45 PM  Result Value Ref Range Status   Specimen Description BLOOD LEFT ARM  Final   Special Requests BOTTLES DRAWN AEROBIC AND ANAEROBIC 4CC   Final   Culture NO GROWTH 3 DAYS  Final   Report Status PENDING  Incomplete  Urine culture     Status: None   Collection Time: 11/06/15 10:36 PM  Result Value Ref Range Status   Specimen Description URINE, CATHETERIZED  Final   Special Requests NONE  Final   Culture NO GROWTH  Final   Report  Status 11/08/2015 FINAL  Final  MRSA PCR Screening     Status: None   Collection Time: 11/09/15 11:54 AM  Result Value Ref Range Status   MRSA by PCR NEGATIVE NEGATIVE Final     Radiology Studies: Dg Chest Port 1 View 11/11/2015 Low lung volumes. Subtle opacity at the left lung base, suspicious for pneumonia. Aortic atherosclerosis.   Dg Chest 1 View 11/06/2015  No acute abnormalities.   Ct Head Wo Contrast 11/07/2015  No acute intracranial process.    Marland Kitchen antiseptic oral rinse  7 mL Mouth Rinse BID  . clarithromycin  500 mg Oral Q12H  . dolutegravir  50 mg Oral Q1200  . dronabinol  2.5 mg Oral BID AC  . emtricitabine-tenofovir AF  1 tablet Oral Q1200  . ethambutol  900 mg Oral Daily  . feeding supplement (ENSURE ENLIVE)  237 mL Oral QID  . ferrous sulfate  325 mg Oral Q breakfast  . fluconazole (DIFLUCAN) IV  400 mg Intravenous Q24H  . multivitamin with minerals  1 tablet Oral Daily  . sodium chloride flush  3 mL Intravenous Q12H  . vitamin B-12  100 mcg Oral Daily  . Vitamin D (Ergocalciferol)  50,000 Units Oral Q Fri     Continuous Infusions: . dextrose 5 % and 0.9% NaCl 75 mL/hr at 11/18/15 0418     LOS: 11 days    Time spent: 25 minutes    Vassie Loll, MD [facility] Pager [phone]  If 7PM-7AM, please contact night-coverage [email or web address] Password [email or web address] 11/18/2015, 5:10 PM

## 2015-11-18 NOTE — Progress Notes (Signed)
Speech Language Pathology Treatment: Dysphagia  Patient Details Name: Samantha Kline MRN: [id number] DOB: January 15, 1961 Today's Date: 11/18/2015 Time: 6269-4854 SLP Time Calculation (min) (ACUTE ONLY): 9 min  Assessment / Plan / Recommendation Clinical Impression  Pt more alert this session than previously seen by this SLP, responsive to questions with low vocal intensity, confused but able to follow commands. Pt denies any pain with swallowing and is not orally holding secretions today. SLP offered sips of water with straw with no oral holding or signs of aspiration. Pt refused any other drinks or solids. Recommend pt continue full liquid diet. Concerned that intake has been very poor for 1-2 weeks. Dietitian could be beneficial for calorie count. Pt may need alternative method of nutrition.    HPI HPI: Samantha Kline is a 55 y.o. female with medical history significant of recently diagnosed HIV, recently treated pneumonia, who presents with altered mental status, generalized weakness, nausea and vomiting.Head CT and CXR negative. 50 lb unintentional weight loss over the past 5 - 6 months along with decreased appetite.  Pt underwent MBS yesterday and tody is seen for tolerance, readiness for advancement.       SLP Plan  Continue with current plan of care     Recommendations  Diet recommendations: Thin liquid Liquids provided via: Cup;Straw Medication Administration: Whole meds with puree Supervision: Full supervision/cueing for compensatory strategies Compensations: Slow rate;Small sips/bites;Follow solids with liquid Postural Changes and/or Swallow Maneuvers: Seated upright 90 degrees;Upright 30-60 min after meal             Plan: Continue with current plan of care     GO               [facility], MA CCC-SLP [phone]  Claudine Mouton 11/18/2015, 9:59 AM

## 2015-11-18 NOTE — Progress Notes (Addendum)
Nutrition Follow-up  DOCUMENTATION CODES:   Non-severe (moderate) malnutrition in context of chronic illness  INTERVENTION:   -Increase Ensure Enlive po to QID, each supplement provides 350 kcal and 20 grams of protein  NUTRITION DIAGNOSIS:   Malnutrition related to chronic illness as evidenced by moderate depletion of body fat, moderate depletions of muscle mass.  Ongoing  GOAL:   Patient will meet greater than or equal to 90% of their needs  Unmet  MONITOR:   Diet advancement, PO intake, Supplement acceptance, Labs, Weight trends, I & O's  REASON FOR ASSESSMENT:   Consult Assessment of nutrition requirement/status  ASSESSMENT:   Patient was recently hospitalized from 7/3-7/10 and had new diagnosis of HIV and pneumonia. She was started with anti-HIV medications. Patient was discharged on bactrim and prednisone with concerns of PCP. Per husband, pt was doing well intially, but 4 days ago she started to decline. Pt developed increased generalized weakness, loss of appetite, decreased oral intake, nauseas, vomiting and confusion. She vomited twice without blood in the vomitus today. Patient does not have abdominal pain or diarrhea. She was initially very confused with AMS, which improved with IV NS bolus, and became oriented x 3. She continues to have dry cough and mild shortness of breath, but no chest pain. No fever or chills. Patient denies symptoms of UTI or unilateral weakness. When I saw pt in ED, she is oriented x 3 and looks very weak, moves all extremities. No active nausea, vomiting, abdominal pain or chest pain.  Pt sitting in bed at time of visit, complaining of stomach pain. She reports her swallow function is improving, but food intake remains minimal (per doc flowsheets PO: 0-10%). Pt reports she likes Ensure supplements and confirms the majority of her intake is coming from supplements, estimating she consumes 2-3 daily. Morning dose of Ensure at bedside, noted pt  completed about 80% of supplement. If consumes 3 Ensure supplements daily, she will meet 65% of estimated kcal needs and 75% of estimated protein needs.   GI and ID following; dysphagia improving with fluconazole. SLP continues to follow.   Medications reviewed, noted marionol added on 11/17/15.   Plan to d/c to SNF vs CIR once medically stable.   Labs reviewed: Na: 128 (on IV supplementation), K: 3.1.  Diet Order:  Diet full liquid Room service appropriate? Yes; Fluid consistency: Thin  Skin:  Wound (see comment) (stage II sacrum, HIV lesions)  Last BM:  11/16/15  Height:   Ht Readings from Last 1 Encounters:  11/07/15 5\' 4"  (1.626 m)    Weight:   Wt Readings from Last 1 Encounters:  11/16/15 133 lb 11.2 oz (60.6 kg)    Ideal Body Weight:  54.5 kg  BMI:  Body mass index is 22.95 kg/m.  Estimated Nutritional Needs:   Kcal:  1600-1800  Protein:  80-95 grams  Fluid:  1.6-1.8 L  EDUCATION NEEDS:   No education needs identified at this time  Kamri Gotsch A. Mayford Knife, RD, LDN, CDE Pager: [phone] After hours Pager: [phone]

## 2015-11-19 LAB — BASIC METABOLIC PANEL
ANION GAP: 6 (ref 5–15)
BUN: 11 mg/dL (ref 6–20)
CALCIUM: 7.1 mg/dL — AB (ref 8.9–10.3)
CHLORIDE: 101 mmol/L (ref 101–111)
CO2: 22 mmol/L (ref 22–32)
CREATININE: 0.81 mg/dL (ref 0.44–1.00)
Glucose, Bld: 99 mg/dL (ref 65–99)
Potassium: 2.8 mmol/L — ABNORMAL LOW (ref 3.5–5.1)
Sodium: 129 mmol/L — ABNORMAL LOW (ref 135–145)

## 2015-11-19 LAB — LIPID PANEL
Cholesterol: 130 mg/dL (ref 0–200)
HDL: <10 mg/dL — ABNORMAL LOW (ref >40)
Triglycerides: 250 mg/dL — ABNORMAL HIGH (ref <150)
VLDL: 50 mg/dL — AB (ref 0–40)

## 2015-11-19 LAB — HEPATIC FUNCTION PANEL
ALBUMIN: 1.4 g/dL — AB (ref 3.5–5.0)
ALT: 14 U/L (ref 14–54)
AST: 30 U/L (ref 15–41)
Alkaline Phosphatase: 50 U/L (ref 38–126)
Bilirubin, Direct: 0.2 mg/dL (ref 0.1–0.5)
Indirect Bilirubin: 0.4 mg/dL (ref 0.3–0.9)
TOTAL PROTEIN: 4.2 g/dL — AB (ref 6.5–8.1)
Total Bilirubin: 0.6 mg/dL (ref 0.3–1.2)

## 2015-11-19 MED ORDER — SODIUM CHLORIDE 0.9 % IV BOLUS (SEPSIS)
500.0000 mL | Freq: Once | INTRAVENOUS | Status: AC
  Administered 2015-11-19: 500 mL via INTRAVENOUS

## 2015-11-19 MED ORDER — ACETAMINOPHEN 325 MG PO TABS
650.0000 mg | ORAL_TABLET | ORAL | Status: DC | PRN
  Administered 2015-11-20: 650 mg via ORAL
  Filled 2015-11-19 (×3): qty 2

## 2015-11-19 MED ORDER — ACETAMINOPHEN 650 MG RE SUPP: 650.0000 mg | RECTAL | Status: DC | PRN

## 2015-11-19 NOTE — Progress Notes (Signed)
Patient ID: Samantha Kline, female   DOB: 1961/02/17, 55 y.o.   MRN: [id number]  PROGRESS NOTE    Aby Bessinger  ZDG:[id number] DOB: December 27, 1960 DOA: 11/06/2015  PCP: No PCP Per Patient   Brief Narrative:  55 y.o. female with past medical history significant for recently diagnosed HIV, recent hospitalization from 10/24/2015 through 10/31/2015 for new diagnosis of HIV pneumonia. Patient was subsequently discharged on Bactrim and prednisone with concerns for pneumocystis pneumonia. Apparently patient was doing well for couple of days after discharge but then started to decline with generalized weakness, loss of appetite, nausea, vomiting and confusion. Patient's family reported no blood with vomiting. No diarrhea. No fevers or chills.  In ED, patient was hemodynamically stable. Blood work was notable for sodium of 110, potassium 5.3, bicarbonate 18, glucose 161. Lactic acid was 2.65 but subsequently normalized. CT head showed no acute intracranial process. Chest x-ray showed no acute abnormalities.  Barrier to discharge: Ongoing fever, leukopenia and difficulty swallowing. ID following. Will follow recommendations. Patient continue Physically declining and with poor intake. Will follow clinical response. Unable to estimate stability for discharge currently.   Assessment & Plan: Acute metabolic encephalopathy / hypoosmolar hyponatremia -Likely secondary to severe hyponatremia. Sodium was as low as 110 on the admission -Hyponatremia presumed to be secondary to lungs infection and also likely because of Bactrim -No acute findings on CT head -Sodium 125-128 now and stable -will monitor intermittently   Fever of unknown origin - Ongoing fever despite multiple abx. Possible PCP infection, IRIS (last one favored) - Blood cultures on admission and repeat cultures has remained negative - Most recent CXR showed low lung volumes and opacity at the left base (no new infiltrates)  -treatment empirically for  MAI to be started today as per ID rec's. Biaxin and ethambutol. Appreciate their assistance.  Oral thrush / Dysphagia: and presumed esophageal candidiasis  - Continue fluconazole, dose increased per ID to 400 mg instead of 100 mg daily. 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   - completed treatment for PCP with prednisone, clindamycin and primaquine. - Continue Dapsone for prophylaxis - following ID rec's and giving ongoing fever and concerns for MAI, will be treated with biaxin and ethambutol   Anemia of chronic disease - Hemoglobin ranges 8 - 8.5 range -no signs of acute bleeding -will follow trend   Leukopenia / ?neutropenia  - Likely from HIV  - no growth on further cx's - patient has completed active antibiotic treatment and currently is receiving treatment for HIV - will follow cell line trend   Hyperkalemia / Hypokalemia  - Secondary to Bactrim - She was given Kayexalate  - Repeated potassium within normal limits and then slightly low  - will replete as needed (most likely from poor PO and GI loses) - will follow electrolytes intermittently   Moderate protein calorie malnutrition -In the context of chronic illness -Continue ensure supplementation -advancing diet as tolerated -started on megace    DVT prophylaxis: Stop Lovenox and use SCD's bilaterally due to drop in platelets over past 2 days 102 --> 79  Code Status: full code  Family Communication: no family at bedside Disposition Plan: continue spiking fever, assessing with ID for stability for  discharge. Plan is for CIR vs SNF   Consultants:   ID  SLP  GI, [facility], consulted 7/25  Procedures:   None   Antimicrobials:   HAART  Dapsone 11/08/2015 -->  Clindamycin 11/08/2015 -->completed course   Primaquine 11/08/2015 -->completed course   Fluconazole 11/11/2015 --> planning to treat for 21 days total   Azithromycin >>>stopped on 7/27 after treatment for MAI started   Biaxin 7/27>>>  Ethambutol 7/27>>>   Subjective: No CP, no SOB. Continue spiking fever. On my exam AOX3, following commands and answering questions properly. Patient is frail and chronically ill in appearance. Endorses that she is eating a little better.   Objective: Vitals:   11/18/15 2142 11/18/15 2335 11/19/15 0057 11/19/15 0528  BP: (!) 153/67   (!) 98/51  Pulse: (!) 103   (!) 101  Resp: 19   20  Temp: (!) 100.8 F (38.2 C) (!) 103.4 F (39.7 C) (!) 102.4 F (39.1 C) 99.4 F (37.4 C)  TempSrc: Oral Rectal Oral Oral  SpO2: 94%   94%  Weight:      Height:       No intake or output data in the 24 hours ending 11/19/15 1618 Filed Weights   11/07/15 0900 11/16/15 0510  Weight: 52.8 kg (116 lb 4.8 oz) 60.6 kg (133 lb 11.2 oz)    Examination:  General exam: dry MM, no oral thrush appreciated on exam, reports no vomiting or diarrhea. Has continue spiking fever intermittently and overall not improving that much.     Respiratory system: No wheezing, diminished breath sounds  Cardiovascular system: S1 & S2 (+), tachycardic  Gastrointestinal system: (+) BS, no tenderness, no distention  Central nervous system: Nonfocal  Extremities: No swelling, palpable pulses  Skin: warm, dry Psychiatry: normal mood and behavior  Data Reviewed: I have personally reviewed following labs and imaging studies  CBC:  Recent Labs Lab 11/15/15 1136 11/15/15 1318 11/16/15 0604 11/17/15 0110 11/18/15 0522  WBC 0.7* 0.7* 0.9* 1.0* 1.6*  NEUTROABS  --  0.4* 0.5* 0.5* 0.9*  HGB 8.0* 7.7* 8.0* 8.2*  7.6*  HCT 23.9* 23.9* 24.2* 24.2* 22.7*  MCV 90.5 91.9 91.0 91.0 91.5  PLT 79* 80* 88* 90* 115*   Basic Metabolic Panel:  Recent Labs Lab 11/14/15 0547 11/15/15 1136 11/16/15 0604 11/17/15 0110 11/19/15 0444  NA 127* 127* 128* 125* 129*  K 3.5 3.2* 3.5 3.1* 2.8*  CL 96* 95* 95* 95* 101  CO2 GLUCOSE 120* 112* 109* 132* 99  BUN 10 5* CREATININE 0.56 0.60 0.62 0.64 0.81  CALCIUM 7.6* 7.6* 7.7* 7.3* 7.1*   GFR: Estimated Creatinine Clearance: 67 mL/min (by C-G formula based on SCr of 0.81 mg/dL).  Urine analysis:    Component Value Date/Time   COLORURINE YELLOW 11/06/2015 2236   APPEARANCEUR CLEAR 11/06/2015 2236   LABSPEC 1.029 11/06/2015 2236   PHURINE 6.0 11/06/2015 2236   GLUCOSEU NEGATIVE 11/06/2015 2236   HGBUR NEGATIVE 11/06/2015 2236   BILIRUBINUR NEGATIVE 11/06/2015 2236   KETONESUR NEGATIVE 11/06/2015 2236   PROTEINUR 30 (A) 11/06/2015 2236   NITRITE NEGATIVE 11/06/2015 2236   LEUKOCYTESUR NEGATIVE 11/06/2015 2236    Recent Results (from the past 240 hour(s))  Culture, blood (Routine x 2)     Status: None (Preliminary result)   Collection Time:  11/06/15  8:30 PM  Result Value Ref Range Status   Specimen Description BLOOD LEFT ARM  Final   Special Requests IN PEDIATRIC BOTTLE 2CC  Final   Culture NO GROWTH 3 DAYS  Final   Report Status PENDING  Incomplete  Culture, blood (Routine x 2)     Status: None (Preliminary result)   Collection Time: 11/06/15  8:45 PM  Result Value Ref Range Status   Specimen Description BLOOD LEFT ARM  Final   Special Requests BOTTLES DRAWN AEROBIC AND ANAEROBIC 4CC   Final   Culture NO GROWTH 3 DAYS  Final   Report Status PENDING  Incomplete  Urine culture     Status: None   Collection Time: 11/06/15 10:36 PM  Result Value Ref Range Status   Specimen Description URINE, CATHETERIZED  Final   Special Requests NONE  Final   Culture NO GROWTH  Final   Report Status 11/08/2015 FINAL  Final  MRSA PCR  Screening     Status: None   Collection Time: 11/09/15 11:54 AM  Result Value Ref Range Status   MRSA by PCR NEGATIVE NEGATIVE Final     Radiology Studies: Dg Chest Port 1 View 11/11/2015 Low lung volumes. Subtle opacity at the left lung base, suspicious for pneumonia. Aortic atherosclerosis.   Dg Chest 1 View 11/06/2015  No acute abnormalities.   Ct Head Wo Contrast 11/07/2015  No acute intracranial process.    Marland Kitchen antiseptic oral rinse  7 mL Mouth Rinse BID  . clarithromycin  500 mg Oral Q12H  . dolutegravir  50 mg Oral Q1200  . dronabinol  2.5 mg Oral BID AC  . emtricitabine-tenofovir AF  1 tablet Oral Q1200  . ethambutol  900 mg Oral Daily  . feeding supplement (ENSURE ENLIVE)  237 mL Oral QID  . ferrous sulfate  325 mg Oral Q breakfast  . fluconazole (DIFLUCAN) IV  400 mg Intravenous Q24H  . multivitamin with minerals  1 tablet Oral Daily  . sodium chloride flush  3 mL Intravenous Q12H  . vitamin B-12  100 mcg Oral Daily  . Vitamin D (Ergocalciferol)  50,000 Units Oral Q Fri     Continuous Infusions: . dextrose 5 % and 0.9% NaCl 75 mL/hr at 11/18/15 2019     LOS: 12 days    Time spent: 25 minutes    Vassie Loll, MD [facility] Pager [phone]  If 7PM-7AM, please contact night-coverage [email or web address] Password [email or web address] 11/19/2015, 4:18 PM

## 2015-11-19 NOTE — Progress Notes (Signed)
[facility]    Date of Admission:  11/06/2015   Total days of antibiotics 12        Day 12 fluc        Day 3 MAC: clarithromycin and ethambutol        Day 12: ART: tivicay plus descovy           ID: Samantha Kline is a 55 y.o. female with advanced HIV disease, CD 4 count of 20/VL 200 (down from 200,000)  finished treatment for PCP pneumonia but still having high fevers x 5 days. Newly initiated on ART in the past 12 days Principal Problem:   Hyponatremia Active Problems:   Human immunodeficiency virus (HIV) disease (HCC)   CAP (community acquired pneumonia)   Malnutrition of moderate degree   Acute encephalopathy   Hyperkalemia   Nausea & vomiting   Pressure ulcer   Elevated lactic acid level   Arterial hypotension   Tachypnea   Tachycardia   Anemia of chronic disease   FUO (fever of unknown origin)   Candida infection, esophageal (HCC)   AIDS (HCC)    Subjective: Febrile to 103.103F yesterday, hemodynamically stable. Reports feeling fatigued, and febrile with associated diaphoresis. No diarrhea  Medications:  . antiseptic oral rinse  7 mL Mouth Rinse BID  . clarithromycin  500 mg Oral Q12H  . dolutegravir  50 mg Oral Q1200  . dronabinol  2.5 mg Oral BID AC  . emtricitabine-tenofovir AF  1 tablet Oral Q1200  . ethambutol  900 mg Oral Daily  . feeding supplement (ENSURE ENLIVE)  237 mL Oral QID  . ferrous sulfate  325 mg Oral Q breakfast  . fluconazole (DIFLUCAN) IV  400 mg Intravenous Q24H  . multivitamin with minerals  1 tablet Oral Daily  . sodium chloride flush  3 mL Intravenous Q12H  . vitamin B-12  100 mcg Oral Daily  . Vitamin D (Ergocalciferol)  50,000 Units Oral Q Fri    Objective: Vital signs in last 24 hours: Temp:  [99.4 F (37.4 C)-103.4 F (39.7 C)] 99.4 F (37.4 C) (07/29 0528) Pulse Rate:  [101-104] 101 (07/29 0528) Resp:  [18-20] 20 (07/29 0528) BP: (98-153)/(51-67) 98/51 (07/29 0528) SpO2:  [94 %-95 %] 94 % (07/29  0528) Physical Exam  Constitutional:  oriented to person, place, but trails off when answering questions. appears chronically ill and mal-nourished. No distress.  HENT: Samantha Kline, PERRLA, no scleral icterus Mouth/Throat: Oropharynx is clear and moist. No oropharyngeal exudate.  Cardiovascular: Normal rate, regular rhythm and normal heart sounds. Exam reveals no gallop and no friction rub.  No murmur heard.  Pulmonary/Chest: Effort normal and breath sounds normal. No respiratory distress.  has no wheezes.  Neck = supple, no nuchal rigidity Abdominal: Soft. Bowel sounds are normal.  exhibits no distension. There is no tenderness.  Skin: Skin is warm and dry. No rash noted. No erythema.  Psychiatric:flat affect  Lab Results  Recent Labs  11/17/15 0110 11/18/15 0522 11/19/15 0444  WBC 1.0* 1.6*  --   HGB 8.2* 7.6*  --   HCT 24.2* 22.7*  --   NA 125*  --  129*  K 3.1*  --  2.8*  CL 95*  --  101  CO2 24  --  22  BUN 6  --  11  CREATININE 0.64  --  0.81    Microbiology: 7/27 blood cx ngtd Studies/Results: No results found.   Assessment/Plan: Fever of unknown origin =  empirically started on MAC treatment 3 days ago but stil having high fever which would be early for response. Recommend to continue for now. We will check for hemophagocytic syndrome with lipid profile, ferritin, lipids, EBV viral load, CMV viral laod and hepatic panel. Will check afb blood culture. After these test results return, the fevers could be due to IRIS.  Leukopenia/neutropenia = appears to be consistent with advanced hiv disease. Continue to follow cbc and neutropenic precautions.  Esophageal candidiasis = continue with IV fluconazole  Advanced HIV disease = continue with tivicay plus descovy. Has had quick response VL on 200 after < 1 month of treatment   Samantha Kline, [facility] Cell: [phone] Pager: [phone]  11/19/2015, 12:26 PM

## 2015-11-19 NOTE — Progress Notes (Signed)
Pt.has a T=103.4,RECTALLY;MD on call [facility] WAS CALLED  &MADE AWARE.

## 2015-11-20 LAB — FERRITIN: FERRITIN: 968 ng/mL — AB (ref 11–307)

## 2015-11-20 LAB — BASIC METABOLIC PANEL
ANION GAP: 5 (ref 5–15)
BUN: 13 mg/dL (ref 6–20)
CALCIUM: 7.1 mg/dL — AB (ref 8.9–10.3)
CO2: 22 mmol/L (ref 22–32)
Chloride: 104 mmol/L (ref 101–111)
Creatinine, Ser: 0.9 mg/dL (ref 0.44–1.00)
GFR calc Af Amer: >60 mL/min (ref >60)
GLUCOSE: 125 mg/dL — AB (ref 65–99)
Potassium: 2.6 mmol/L — CL (ref 3.5–5.1)
Sodium: 131 mmol/L — ABNORMAL LOW (ref 135–145)

## 2015-11-20 LAB — CMV ANTIBODY, IGG (EIA)

## 2015-11-20 MED ORDER — POTASSIUM CHLORIDE CRYS ER 20 MEQ PO TBCR
40.0000 meq | EXTENDED_RELEASE_TABLET | ORAL | Status: AC
  Administered 2015-11-20 (×2): 40 meq via ORAL
  Filled 2015-11-20 (×2): qty 2

## 2015-11-20 MED ORDER — MAGNESIUM SULFATE 2 GM/50ML IV SOLN
2.0000 g | Freq: Once | INTRAVENOUS | Status: AC
  Administered 2015-11-20: 2 g via INTRAVENOUS
  Filled 2015-11-20: qty 50

## 2015-11-20 NOTE — Progress Notes (Signed)
Critical Potassium of 2.6 made aware to MD [facility]. Please see orders.

## 2015-11-20 NOTE — Progress Notes (Signed)
Patient is refusing to take PO medications. They were crushed in applesauce. Patient puts them in her mouth and continues to say she swallowed them but then spits them out when you look away.   Patient's family states she's been taking her medication and isn't sure why she wont now.  When you ask patient why she wont swallow she doesn't answer. Dr. Gwenlyn Perking was paged and advised abut the situation. Awaiting response on if any meds can be changed to IV route.

## 2015-11-20 NOTE — Progress Notes (Signed)
Patient ID: Samantha Kline, female   DOB: 1960/09/07, 55 y.o.   MRN: [id number]  PROGRESS NOTE    Chabely Norby  MWN:[id number] DOB: 24-Jul-1960 DOA: 11/06/2015  PCP: No PCP Per Patient   Brief Narrative:  55 y.o. female with past medical history significant for recently diagnosed HIV, recent hospitalization from 10/24/2015 through 10/31/2015 for new diagnosis of HIV pneumonia. Patient was subsequently discharged on Bactrim and prednisone with concerns for pneumocystis pneumonia. Apparently patient was doing well for couple of days after discharge but then started to decline with generalized weakness, loss of appetite, nausea, vomiting and confusion. Patient's family reported no blood with vomiting. No diarrhea. No fevers or chills.  In ED, patient was hemodynamically stable. Blood work was notable for sodium of 110, potassium 5.3, bicarbonate 18, glucose 161. Lactic acid was 2.65 but subsequently normalized. CT head showed no acute intracranial process. Chest x-ray showed no acute abnormalities.  Barrier to discharge: Ongoing fever, leukopenia and difficulty swallowing. ID following. Will follow recommendations. Patient continue Physically declining and with poor intake. Will follow clinical response. Unable to estimate stability for discharge currently.   Assessment & Plan: Acute metabolic encephalopathy / hypoosmolar hyponatremia -Likely secondary to severe hyponatremia. Sodium was as low as 110 on the admission -Hyponatremia presumed to be secondary to lungs infection and also likely because of Bactrim -No acute findings on CT head -Sodium 125-128 now and stable -will monitor intermittently   Fever of unknown origin - Ongoing fever despite multiple abx. Possible PCP infection, IRIS (last one favored) - Blood cultures on admission and repeat cultures has remained negative - Most recent CXR showed low lung volumes and opacity at the left base (no new infiltrates)  -treatment empirically for  MAI to be started today as per ID rec's. Biaxin and ethambutol. Appreciate their assistance.  Oral thrush / Dysphagia: and presumed esophageal candidiasis  - Continue fluconazole, dose increased per ID to 400 mg instead of 100 mg daily. 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   - completed treatment for PCP with prednisone, clindamycin and primaquine. - Continue Dapsone for prophylaxis - following ID rec's and giving ongoing fever and concerns for MAI, will be treated with biaxin and ethambutol  -CMV antibody was positive; will follow load (which is pending) to determine needs for treatment.  Anemia of chronic disease -Hemoglobin ranges 8 - 8.5 range -no signs of acute bleeding -will follow trend   Leukopenia / ?neutropenia  - Likely from HIV  - no growth on further cx's - patient has completed active antibiotic treatment and currently is receiving treatment for HIV - will follow cell line trend   Hyperkalemia / Hypokalemia  - Secondary to Bactrim - She was given Kayexalate  - will replete as needed (most likely from poor PO and GI loses) - will follow electrolytes intermittently   Moderate protein calorie malnutrition -In the context of chronic illness -Continue ensure supplementation -advancing diet as tolerated -started on megace    DVT prophylaxis: Stop Lovenox and use SCD's bilaterally due to drop in platelets over past 2 days  102 --> 79 Code Status: full code  Family Communication: no family at bedside Disposition Plan: continue spiking fever, assessing  with ID for stability for discharge. Plan is for CIR vs SNF   Consultants:   ID  SLP  GI, [facility], consulted 7/25  Procedures:   None   Antimicrobials:   HAART  Dapsone 11/08/2015 -->  Clindamycin 11/08/2015 -->completed course   Primaquine 11/08/2015 -->completed course   Fluconazole 11/11/2015 --> planning to treat for 21 days total   Azithromycin >>>stopped on 7/27 after treatment for MAI started   Biaxin 7/27>>>  Ethambutol 7/27>>>   Subjective: No CP, no SOB. Continue spiking fever. On my exam AOX3, following commands and answering questions properly. Patient is frail and chronically ill in appearance. Endorses that she is eating a little better and drinking her ensure.   Objective: Vitals:   11/19/15 0057 11/19/15 0528 11/19/15 2305 11/20/15 0605  BP:  (!) 98/51 (!) 93/43 (!) 108/54  Pulse:  (!) 101 (!) 108 100  Resp:  Temp: (!) 102.4 F (39.1 C) 99.4 F (37.4 C) 99.5 F (37.5 C) (!) 100.8 F (38.2 C)  TempSrc: Oral Oral Oral   SpO2:  94% 98% 97%  Weight:      Height:        Intake/Output Summary (Last 24 hours) at 11/20/15 1745 Last data filed at 11/20/15 0939  Gross per 24 hour  Intake           1372.5 ml  Output                0 ml  Net           1372.5 ml   Filed Weights   11/07/15 0900 11/16/15 0510  Weight: 52.8 kg (116 lb 4.8 oz) 60.6 kg (133 lb 11.2 oz)    Examination:  General exam: dry MM, no oral thrush appreciated on exam, reports no vomiting or diarrhea. Has continue spiking fever intermittently, but overall fever curve improving.     Respiratory system: No wheezing, diminished breath sounds  Cardiovascular system: S1 & S2 (+), tachycardic  Gastrointestinal system: (+) BS, no tenderness, no distention  Central nervous system: Nonfocal  Extremities: No swelling, palpable pulses  Skin: warm, dry Psychiatry: normal mood and behavior  Data Reviewed: I have personally reviewed following labs and imaging  studies  CBC:  Recent Labs Lab 11/15/15 1136 11/15/15 1318 11/16/15 0604 11/17/15 0110 11/18/15 0522  WBC 0.7* 0.7* 0.9* 1.0* 1.6*  NEUTROABS  --  0.4* 0.5* 0.5* 0.9*  HGB 8.0* 7.7* 8.0* 8.2* 7.6*  HCT 23.9* 23.9* 24.2* 24.2* 22.7*  MCV 90.5 91.9 91.0 91.0 91.5  PLT 79* 80* 88* 90* 115*   Basic Metabolic Panel:  Recent Labs Lab 11/15/15 1136 11/16/15 0604 11/17/15 0110 11/19/15 0444 11/20/15 1206  NA 127* 128* 125* 129* 131*  K 3.2* 3.5 3.1* 2.8* 2.6*  CL 95* 95* 95* 101 104  CO2 GLUCOSE 112* 109* 132* 99 125*  BUN 5* CREATININE 0.60 0.62 0.64 0.81 0.90  CALCIUM 7.6* 7.7* 7.3* 7.1* 7.1*   GFR: Estimated Creatinine Clearance: 60.3 mL/min (by C-G formula based on SCr of 0.9 mg/dL).  Urine analysis:    Component Value Date/Time   COLORURINE YELLOW 11/06/2015 2236   APPEARANCEUR CLEAR 11/06/2015 2236   LABSPEC 1.029 11/06/2015 2236   PHURINE 6.0 11/06/2015 2236   GLUCOSEU NEGATIVE 11/06/2015 2236  HGBUR NEGATIVE 11/06/2015 2236   BILIRUBINUR NEGATIVE 11/06/2015 2236   KETONESUR NEGATIVE 11/06/2015 2236   PROTEINUR 30 (A) 11/06/2015 2236   NITRITE NEGATIVE 11/06/2015 2236   LEUKOCYTESUR NEGATIVE 11/06/2015 2236    Recent Results (from the past 240 hour(s))  Culture, blood (Routine x 2)     Status: None (Preliminary result)   Collection Time: 11/06/15  8:30 PM  Result Value Ref Range Status   Specimen Description BLOOD LEFT ARM  Final   Special Requests IN PEDIATRIC BOTTLE 2CC  Final   Culture NO GROWTH 3 DAYS  Final   Report Status PENDING  Incomplete  Culture, blood (Routine x 2)     Status: None (Preliminary result)   Collection Time: 11/06/15  8:45 PM  Result Value Ref Range Status   Specimen Description BLOOD LEFT ARM  Final   Special Requests BOTTLES DRAWN AEROBIC AND ANAEROBIC 4CC   Final   Culture NO GROWTH 3 DAYS  Final   Report Status PENDING  Incomplete  Urine culture     Status: None   Collection Time: 11/06/15  10:36 PM  Result Value Ref Range Status   Specimen Description URINE, CATHETERIZED  Final   Special Requests NONE  Final   Culture NO GROWTH  Final   Report Status 11/08/2015 FINAL  Final  MRSA PCR Screening     Status: None   Collection Time: 11/09/15 11:54 AM  Result Value Ref Range Status   MRSA by PCR NEGATIVE NEGATIVE Final     Radiology Studies: Dg Chest Port 1 View 11/11/2015 Low lung volumes. Subtle opacity at the left lung base, suspicious for pneumonia. Aortic atherosclerosis.   Dg Chest 1 View 11/06/2015  No acute abnormalities.   Ct Head Wo Contrast 11/07/2015  No acute intracranial process.    Marland Kitchen antiseptic oral rinse  7 mL Mouth Rinse BID  . clarithromycin  500 mg Oral Q12H  . dolutegravir  50 mg Oral Q1200  . dronabinol  2.5 mg Oral BID AC  . emtricitabine-tenofovir AF  1 tablet Oral Q1200  . ethambutol  900 mg Oral Daily  . feeding supplement (ENSURE ENLIVE)  237 mL Oral QID  . ferrous sulfate  325 mg Oral Q breakfast  . fluconazole (DIFLUCAN) IV  400 mg Intravenous Q24H  . multivitamin with minerals  1 tablet Oral Daily  . sodium chloride flush  3 mL Intravenous Q12H  . vitamin B-12  100 mcg Oral Daily  . Vitamin D (Ergocalciferol)  50,000 Units Oral Q Fri     Continuous Infusions: . dextrose 5 % and 0.9% NaCl 75 mL/hr at 11/20/15 1023     LOS: 13 days    Time spent: 25 minutes    Vassie Loll, MD [facility] Pager [phone]  If 7PM-7AM, please contact night-coverage [email or web address] Password [email or web address] 11/20/2015, 5:45 PM

## 2015-11-20 NOTE — Progress Notes (Signed)
[facility]    Date of Admission:  11/06/2015   Total days of antibiotics 13        Day 13 fluc        Day 4 MAC: clarithromycin and ethambutol        Day 13: ART: tivicay plus descovy           ID: Samantha Kline is a 55 y.o. female with advanced HIV disease, CD 4 count of 20/VL 200 (down from 200,000)  finished treatment for PCP pneumonia but still having high fevers x 5 days. Newly initiated on ART in the past 12 days Principal Problem:   Hyponatremia Active Problems:   Human immunodeficiency virus (HIV) disease (HCC)   CAP (community acquired pneumonia)   Malnutrition of moderate degree   Acute encephalopathy   Hyperkalemia   Nausea & vomiting   Pressure ulcer   Elevated lactic acid level   Arterial hypotension   Tachypnea   Tachycardia   Anemia of chronic disease   FUO (fever of unknown origin)   Candida infection, esophageal (HCC)   AIDS (HCC)    Subjective: Febrile to 100.22F yesterday, hemodynamically stable. Reports feeling fatigued, and febrile with poor oral intake due to dysphagia.  Medications:  . antiseptic oral rinse  7 mL Mouth Rinse BID  . clarithromycin  500 mg Oral Q12H  . dolutegravir  50 mg Oral Q1200  . dronabinol  2.5 mg Oral BID AC  . emtricitabine-tenofovir AF  1 tablet Oral Q1200  . ethambutol  900 mg Oral Daily  . feeding supplement (ENSURE ENLIVE)  237 mL Oral QID  . ferrous sulfate  325 mg Oral Q breakfast  . fluconazole (DIFLUCAN) IV  400 mg Intravenous Q24H  . multivitamin with minerals  1 tablet Oral Daily  . potassium chloride  40 mEq Oral Q4H  . sodium chloride flush  3 mL Intravenous Q12H  . vitamin B-12  100 mcg Oral Daily  . Vitamin D (Ergocalciferol)  50,000 Units Oral Q Fri    Objective: Vital signs in last 24 hours: Temp:  [99.5 F (37.5 C)-100.8 F (38.2 C)] 100.8 F (38.2 C) (07/30 0605) Pulse Rate:  [100-108] 100 (07/30 0605) Resp:  [18-19] 18 (07/30 0605) BP: (93-108)/(43-54) 108/54  (07/30 0605) SpO2:  [97 %-98 %] 97 % (07/30 0938) Physical Exam  Constitutional:  oriented to person, place, but trails off when answering questions. appears chronically ill and mal-nourished. No distress.  HENT: [place]/AT, PERRLA, no scleral icterus Mouth/Throat: Oropharynx is clear and moist. No oropharyngeal exudate. Dry dark coating to tongue Cardiovascular: Normal rate, regular rhythm and normal heart sounds. Exam reveals no gallop and no friction rub.  No murmur heard.  Pulmonary/Chest: Effort normal and breath sounds normal. No respiratory distress.  has no wheezes.  Neck = supple, no nuchal rigidity Abdominal: Soft. Bowel sounds are normal.  exhibits no distension. There is no tenderness.  Skin: Skin is warm and dry. No rash noted. No erythema.  Psychiatric:flat affect  Lab Results  Recent Labs  11/18/15 0522 11/19/15 0444 11/20/15 1206  WBC 1.6*  --   --   HGB 7.6*  --   --   HCT 22.7*  --   --   NA  --  129* 131*  K  --  2.8* 2.6*  CL  --  101 104  CO2  --  22 22  BUN  --  11 13  CREATININE  --  0.81  0.90    Microbiology: 7/27 blood cx ngtd Studies/Results: No results found.   Assessment/Plan: Fever of unknown origin = empirically started on MAC treatment 4 days ago and fever curve starting to decrease. Work up for  hemophagocytic syndrome is negative. EBV viral load, CMV viral load is pending.  Leukopenia/neutropenia = appears to be consistent with advanced hiv disease. Continue to follow cbc and neutropenic precautions.  Esophageal candidiasis = continue with IV fluconazole  Advanced HIV disease = continue with tivicay plus descovy. Has had quick response VL on 200 after < 1 month of treatment  Hyponatremia = possibly due to hypovolemia. Recommend to increase oral intake  Hypokalemia = defer to primary team for repletion.   Anemia = has trended down during admission. Recommend to get cbc and consider need for transfusion   Drue Second [facility]  [facility] Cell: [phone] Pager: [phone]  11/20/2015, 4:58 PM

## 2015-11-21 DIAGNOSIS — R5381 Other malaise: Secondary | ICD-10-CM

## 2015-11-21 LAB — BASIC METABOLIC PANEL
ANION GAP: 2 — AB (ref 5–15)
BUN: 11 mg/dL (ref 6–20)
CALCIUM: 7.1 mg/dL — AB (ref 8.9–10.3)
CO2: 22 mmol/L (ref 22–32)
CREATININE: 0.67 mg/dL (ref 0.44–1.00)
Chloride: 108 mmol/L (ref 101–111)
Glucose, Bld: 119 mg/dL — ABNORMAL HIGH (ref 65–99)
Potassium: 3.6 mmol/L (ref 3.5–5.1)
SODIUM: 132 mmol/L — AB (ref 135–145)

## 2015-11-21 LAB — MAGNESIUM: Magnesium: 2 mg/dL (ref 1.7–2.4)

## 2015-11-21 NOTE — Progress Notes (Signed)
Speech Language Pathology Treatment: Dysphagia  Patient Details Name: Samantha Kline MRN: [id number] DOB: Oct 17, 1960 Today's Date: 11/21/2015 Time: 1440-1500 SLP Time Calculation (min) (ACUTE ONLY): 20 min  Assessment / Plan / Recommendation Clinical Impression  Pt not swallowing oral secretions and found to be holding them again today. SLP offered max verbal and minimal tactile cues to help pt initiate oral suction. Max verbal cues for intake of thin liquids. Over 20 minutes session pt took 5 sips of ensure and two bites of fruit. Cues needed to initiate swallow. Attempted to provided a larger solid bolus for increased sensory feedback. Pt just ended up very fatigued by the end of the session. Continue to recommend a thin liquid diet for energy conservation and continued oral holding behaviors. Pt would benefit from Palliative care referral or temporary alternate nutrition if more aggressive measures are desired.    HPI HPI: Samantha Kline is a 54 y.o. female with medical history significant of recently diagnosed HIV, recently treated pneumonia, who presents with altered mental status, generalized weakness, nausea and vomiting.Head CT and CXR negative. 50 lb unintentional weight loss over the past 5 - 6 months along with decreased appetite.  Pt underwent MBS yesterday and tody is seen for tolerance, readiness for advancement.       SLP Plan  Continue with current plan of care     Recommendations  Diet recommendations: Thin liquid Liquids provided via: Cup;Straw Medication Administration: Whole meds with puree Supervision: Full supervision/cueing for compensatory strategies Compensations: Slow rate;Small sips/bites;Follow solids with liquid             Oral Care Recommendations: Oral care before and after PO Follow up Recommendations: Skilled Nursing facility Plan: Continue with current plan of care     GO                Samantha Kline, Samantha Kline 11/21/2015, 3:13 PM

## 2015-11-21 NOTE — Progress Notes (Signed)
Occupational Therapy Treatment Patient Details Name: Samantha Kline MRN: [id number] DOB: 1960-12-21 Today's Date: 11/21/2015    History of present illness 55 y.o. female with medical history significant of recently diagnosed HIV, recently treated pneumonia, who presents with altered mental status, generalized weakness, nausea and vomiting.Head CT and CXR negative. 50 lb unintentional weight loss over the past 5 - 6 months along with decreased appetite.    OT comments  Patient making slow progress towards OT goals, will continue plan of care for now and plan for OT re-assessment next session. Pt currently requires up to max assist for bed mobility and to maintain dynamic sitting balance EOB (+2 helpful for safety during this). Pt also requires max assist +2 for functional OOB transfers.    Follow Up Recommendations  Supervision/Assistance - 24 hour;CIR    Equipment Recommendations  Wheelchair (measurements OT);Wheelchair cushion (measurements OT);Hospital bed;Other (comment) (TBD)    Recommendations for Other Services  None at this time   Precautions / Restrictions Precautions Precautions: Fall Precaution Comments: monitor O2 & HR Restrictions Weight Bearing Restrictions: No    Mobility Bed Mobility Overal bed mobility: Needs Assistance Bed Mobility: Supine to Sit;Rolling Rolling: Max assist   Supine to sit: Max assist;+2 for physical assistance;+2 for safety/equipment     General bed mobility comments: Increased assistance to roll and sit up in bed. Pt unable to push self up in bed. Assistance needed for BLE management and trunk control to come into sitting. Pt with right lateral lean.   Transfers Overall transfer level: Needs assistance Equipment used: 2 person hand held assist Transfers: Stand Pivot Transfers;Sit to/from Stand Sit to Stand: Mod assist;+2 physical assistance;+2 safety/equipment Stand pivot transfers: Mod assist;+2 physical assistance;+2 safety/equipment        General transfer comment: Blocking knees and assisting pt under bilateral arms. Pt performed stand pivot transfer to recliner. Cues for technique, sequencing, and safety needed.     Balance Overall balance assessment: Needs assistance Sitting-balance support: Feet supported;Bilateral upper extremity supported Sitting balance-Leahy Scale: Poor   Postural control: Right lateral lean;Posterior lean     Standing balance comment: Unable to safely attempt     ADL Overall ADL's : Needs assistance/impaired         Upper Body Bathing: Sitting;Total assistance Upper Body Bathing Details (indicate cue type and reason): max assist to maintain dynamic sitting EOB     Upper Body Dressing : Total assistance;Sitting Upper Body Dressing Details (indicate cue type and reason): max assist to maintain dynamic sitting EOB      Toilet Transfer: Moderate assistance;+2 for physical assistance;+2 for safety/equipment Toilet Transfer Details (indicate cue type and reason): simulated EOB to recliner transfer            General ADL Comments: Pt able to tolerate bed mobility, sitting EOB ~10 minutes during functional tasks and activities, and transfer EOB to recliner.      Cognition   Behavior During Therapy: Flat affect Overall Cognitive Status: Impaired/Different from baseline Area of Impairment: Problem solving;Attention;Memory;Orientation;Awareness Orientation Level: Disoriented to;Time;Place Current Attention Level: Selective Memory: Decreased short-term memory  Following Commands: Follows one step commands consistently Safety/Judgement: Decreased awareness of safety Awareness: Emergent Problem Solving: Slow processing;Difficulty sequencing General Comments: Increased time requried for processing      Pertinent Vitals/ Pain       Pain Assessment: No/denies pain  Frequency Min 2X/week     Progress Toward Goals  OT Goals(current goals can now be found in the care plan section)   Progress  towards OT goals: Progressing toward goals;OT to reassess next treatment (Slow progression towards goals, will continue plan of care for now. )  Acute Rehab OT Goals Patient Stated Goal: None stated OT Goal Formulation: With patient Time For Goal Achievement: 11/22/15 Potential to Achieve Goals: Good  Plan Discharge plan remains appropriate    Co-evaluation    PT/OT/SLP Co-Evaluation/Treatment: Yes Reason for Co-Treatment: Complexity of the patient's impairments (multi-system involvement);For patient/therapist safety   OT goals addressed during session: ADL's and self-care;Other (comment) (functional mobility)      End of Session Equipment Utilized During Treatment: Gait belt   Activity Tolerance Patient tolerated treatment well   Patient Left in chair;with call bell/phone within reach;with chair alarm set;with family/visitor present   Nurse Communication Mobility status     Time: 1610-9604 OT Time Calculation (min): 30 min  Charges: OT General Charges $OT Visit: 1 Procedure OT Treatments $Therapeutic Activity: 8-22 mins  Edwin Cap , MS, OTR/L, CLT Pager: [phone]  11/21/2015, 12:57 PM

## 2015-11-21 NOTE — Progress Notes (Signed)
Noted RT.arm swollen ;checked IV site & has a good blood return,;IV team consult done to check IV site.Will endorse to next shift nurse.

## 2015-11-21 NOTE — Progress Notes (Signed)
Chart reviewed including speech evaluation.  Still with difficulty swallowing, fatigued.  I have recommended reevaluation by GI for ? CMV, EGD, and consideration of temporary tube feeds, either NG tube vs PEG tube.

## 2015-11-21 NOTE — Progress Notes (Signed)
Physical Therapy Treatment Patient Details Name: Samantha Kline MRN: [id number] DOB: 12-11-1960 Today's Date: 11/21/2015    History of Present Illness 55 y.o. female with medical history significant of recently diagnosed HIV, recently treated pneumonia, who presents with altered mental status, generalized weakness, nausea and vomiting.Head CT and CXR negative. 50 lb unintentional weight loss over the past 5 - 6 months along with decreased appetite.     PT Comments    Patient is making gradual progress toward mobility goals and reported feeling better today vs previous session. Pt tolerated stand pivot transfer with HR elevated to 126 from 99 at rest. Pt required mod/max A +2 for bed mobility, sitting balance, and OOB transfer. Continue to progress as tolerated.   Follow Up Recommendations  CIR     Equipment Recommendations  Other (comment)    Recommendations for Other Services OT consult     Precautions / Restrictions Precautions Precautions: Fall Precaution Comments: monitor O2 & HR Restrictions Weight Bearing Restrictions: No    Mobility  Bed Mobility Overal bed mobility: Needs Assistance Bed Mobility: Supine to Sit;Rolling Rolling: Max assist   Supine to sit: Max assist;+2 for physical assistance;+2 for safety/equipment     General bed mobility comments: assist to roll and hand over hand assist for use of bed rail to maintain sidelying; assist to elevate trunk into sitting and manage bilat LE to keep feet on the floor; max A for sitting balance with posterior and R lateral lean  Transfers Overall transfer level: Needs assistance Equipment used: 2 person hand held assist (gait belt and bed pad) Transfers: Stand Pivot Transfers Sit to Stand: Mod assist;+2 physical assistance;+2 safety/equipment Stand pivot transfers: Mod assist;+2 physical assistance;+2 safety/equipment       General transfer comment: cues for safe hand placement and technique; therapist blocking  bilat knees and pt holding onto therapists' arms  Ambulation/Gait                 Stairs            Wheelchair Mobility    Modified Rankin (Stroke Patients Only)       Balance Overall balance assessment: Needs assistance (Simultaneous filing. User may not have seen previous data.) Sitting-balance support: Bilateral upper extremity supported;Feet supported (Simultaneous filing. User may not have seen previous data.) Sitting balance-Leahy Scale: Poor (Simultaneous filing. User may not have seen previous data.)   Postural control: Posterior lean;Right lateral lean                          Cognition Arousal/Alertness: Awake/alert Behavior During Therapy: Flat affect Overall Cognitive Status: Impaired/Different from baseline Area of Impairment: Problem solving;Attention;Memory;Orientation;Awareness Orientation Level: Disoriented to;Time;Place Current Attention Level: Selective Memory: Decreased short-term memory Following Commands: Follows one step commands consistently Safety/Judgement: Decreased awareness of safety Awareness: Emergent Problem Solving: Slow processing;Difficulty sequencing General Comments: Increased time requried for processing     Exercises      General Comments General comments (skin integrity, edema, etc.): HR up to 126 with stand pivot transfer      Pertinent Vitals/Pain Pain Assessment: No/denies pain    Home Living                      Prior Function            PT Goals (current goals can now be found in the care plan section) Acute Rehab PT Goals Patient Stated Goal: None stated Progress towards PT  goals: Progressing toward goals    Frequency  Min 3X/week    PT Plan Current plan remains appropriate    Co-evaluation PT/OT/SLP Co-Evaluation/Treatment: Yes Reason for Co-Treatment: Complexity of the patient's impairments (multi-system involvement);For patient/therapist safety PT goals addressed during  session: Mobility/safety with mobility OT goals addressed during session: ADL's and self-care;Other (comment) (functional mobility)     End of Session Equipment Utilized During Treatment: Gait belt Activity Tolerance: Patient tolerated treatment well Patient left: in chair;with call bell/phone within reach;with family/visitor present;with chair alarm set     Time: 4782-9562 PT Time Calculation (min) (ACUTE ONLY): 30 min  Charges:  $Therapeutic Activity: 8-22 mins                    G Codes:      Derek Mound, PTA Pager: [phone]   11/21/2015, 1:03 PM

## 2015-11-21 NOTE — Progress Notes (Signed)
[facility]   Reason for visit: Follow up on HIV  Interval History: now afebrile.  Still not eating much, husband at bedside.  Now not wanting medication.   Physical Exam: Constitutional:  Vitals:   11/20/15 2102 11/21/15 0612  BP: 126/73 124/72  Pulse: 95 (!) 103  Resp: 16 16  Temp: 98.8 F (37.1 C) 100.1 F (37.8 C)   patient appears in NAD; chronically ill appearing HENT: opens mouth more, no thrush noted Respiratory: Normal respiratory effort; CTA B Cardiovascular: RRR  Review of Systems: Constitutional: positive for anorexia or negative for chills Gastrointestinal: negative for diarrhea  Lab Results  Component Value Date   WBC 1.6 (L) 11/18/2015   HGB 7.6 (L) 11/18/2015   HCT 22.7 (L) 11/18/2015   MCV 91.5 11/18/2015   PLT 115 (L) 11/18/2015    Lab Results  Component Value Date   CREATININE 0.67 11/21/2015   BUN 11 11/21/2015   NA 132 (L) 11/21/2015   K 3.6 11/21/2015   CL 108 11/21/2015   CO2 22 11/21/2015    Lab Results  Component Value Date   ALT 14 11/19/2015   AST 30 11/19/2015   ALKPHOS 50 11/19/2015     Microbiology: Recent Results (from the past 240 hour(s))  Culture, blood (routine x 2)     Status: None   Collection Time: 11/11/15  4:50 PM  Result Value Ref Range Status   Specimen Description BLOOD LEFT ANTECUBITAL  Final   Special Requests BOTTLES DRAWN AEROBIC ONLY 5CC  Final   Culture NO GROWTH 5 DAYS  Final   Report Status 11/16/2015 FINAL  Final  Culture, blood (routine x 2)     Status: None   Collection Time: 11/11/15  4:55 PM  Result Value Ref Range Status   Specimen Description BLOOD BLOOD LEFT ARM  Final   Special Requests IN PEDIATRIC BOTTLE 1CC  Final   Culture NO GROWTH 5 DAYS  Final   Report Status 11/16/2015 FINAL  Final  Acid Fast Smear (AFB)     Status: None   Collection Time: 11/11/15  8:11 PM  Result Value Ref Range Status   AFB Specimen Processing Concentration  Final    Comment:  (NOTE) Performed At: [facility] [address] [place], [place] [id number] Mila Homer MD OV:[id number]    Acid Fast Smear NOSMR  Final    Comment: (NOTE) The acid-fast bacilli (AFB) smear will not be performed due to the specimen source (blood) or submission of an insufficient quantity of specimen.    Source (AFB) BLOOD  Corrected  Culture, blood (routine x 2)     Status: None (Preliminary result)   Collection Time: 11/17/15  1:10 AM  Result Value Ref Range Status   Specimen Description BLOOD RIGHT ANTECUBITAL  Final   Special Requests BOTTLES DRAWN AEROBIC AND ANAEROBIC 10CC,5CC  Final   Culture NO GROWTH 3 DAYS  Final   Report Status PENDING  Incomplete  Culture, blood (routine x 2)     Status: None (Preliminary result)   Collection Time: 11/17/15  1:23 AM  Result Value Ref Range Status   Specimen Description BLOOD RIGHT HAND  Final   Special Requests IN PEDIATRIC BOTTLE 3CC  Final   Culture NO GROWTH 3 DAYS  Final   Report Status PENDING  Incomplete    Impression/Plan:  1. Dysphagia - seems to be improving with fluconazole 400 mg.  Will continue 21 days total.  Can  take it po but continuing IV for now with difficulty swallowing.  2. 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  Viral load improving.  3. PCP - completed treatment 4. 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  Ferritin up but not over 1000, LFTs wnl.  Fever much improved since starting ethambutol and clarithromycin so I would continue this.  AFB blood culture ngtd from 7/21.   5. Wasting - no appetite, on marinol. Not eating much, even Ensure supplements.  Now not taking all of her medications. She will need both medications and po intake to survive this.  ?PEG vs NGT if she doesn't eat otherwise. 6. Dispo - will need rehab.    Dr. Daiva Eves to follow up tomorrow

## 2015-11-21 NOTE — Progress Notes (Signed)
Patient ID: Samantha Kline, female   DOB: August 09, 1960, 55 y.o.   MRN: [id number]  PROGRESS NOTE    Samantha Kline  WUJ:[id number] DOB: Dec 20, 1960 DOA: 11/06/2015  PCP: No PCP Per Patient   Brief Narrative:  55 y.o. female with past medical history significant for recently diagnosed HIV, recent hospitalization from 10/24/2015 through 10/31/2015 for new diagnosis of HIV pneumonia. Patient was subsequently discharged on Bactrim and prednisone with concerns for pneumocystis pneumonia. Apparently patient was doing well for couple of days after discharge but then started to decline with generalized weakness, loss of appetite, nausea, vomiting and confusion. Patient's family reported no blood with vomiting. No diarrhea. No fevers or chills.  In ED, patient was hemodynamically stable. Blood work was notable for sodium of 110, potassium 5.3, bicarbonate 18, glucose 161. Lactic acid was 2.65 but subsequently normalized. CT head showed no acute intracranial process. Chest x-ray showed no acute abnormalities.  Barrier to discharge: Ongoing fever, leukopenia and difficulty swallowing. ID following. Will follow recommendations. Patient continue Physically declining and with poor intake. Will follow clinical response. Unable to estimate stability for discharge currently.   Assessment & Plan: Acute metabolic encephalopathy / hypoosmolar hyponatremia -Likely secondary to severe hyponatremia. Sodium was as low as 110 on the admission -Hyponatremia presumed to be secondary to lungs infection and also likely because of Bactrim -No acute findings on CT head -Sodium 128-132 now and stable -will monitor intermittently   Fever of unknown origin - Ongoing fever despite multiple abx. Possible PCP infection, IRIS (last one favored) - Blood cultures on admission and repeat cultures has remained negative - Most recent CXR showed low lung volumes and opacity at the left base (no new infiltrates)  -treatment empirically for  MAI to be started today as per ID rec's. Biaxin and ethambutol. Appreciate their assistance.  Oral thrush / Dysphagia: and presumed esophageal candidiasis  - Continue fluconazole, dose increased per ID to 400 mg instead of 100 mg daily. 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   - completed treatment for PCP with prednisone, clindamycin and primaquine. - Continue Dapsone for prophylaxis - following ID rec's and giving ongoing fever and concerns for MAI, will be treated with biaxin and ethambutol  -CMV antibody was positive; will follow load (which is pending) to determine needs for treatment.  Anemia of chronic disease -Hemoglobin ranges 7.6 - 8.5 range -no signs of acute bleeding -will follow trend and transfuse for Hgb < 7  Leukopenia / ?neutropenia  - Likely from HIV  - no growth on further cx's - patient has completed active antibiotic treatment and currently is receiving treatment for HIV - will follow cell line trend  -no signs of acute bleeding   Hyperkalemia / Hypokalemia  - Secondary to Bactrim - She was given Kayexalate at some point; now with low K intermittently - will replete as needed (most likely from poor PO and GI loses) - will follow electrolytes and replete as needed -Mg WNL  Severe protein calorie malnutrition -In the context of chronic illness -Continue ensure supplementation and encouraged to increase PO  intake -advancing diet as tolerated -started on megace    DVT prophylaxis: Stop Lovenox and use SCD's bilaterally due to drop in platelets  over past 2 days 102 --> 79 Code Status: full code  Family Communication: no family at bedside Disposition Plan: continue having low grade temp intermittently, assessing with ID for stability for discharge. Plan is for CIR vs SNF   Consultants:   ID  SLP  GI, [facility], consulted 7/25  Procedures:   None   Antimicrobials:   HAART  Dapsone 11/08/2015 -->  Clindamycin 11/08/2015 -->completed course   Primaquine 11/08/2015 -->completed course   Fluconazole 11/11/2015 --> planning to treat for 21 days total   Azithromycin >>>stopped on 7/27 after treatment for MAI started   Biaxin 7/27>>>  Ethambutol 7/27>>>   Subjective: No CP, no SOB. Continue having low grade temp. Patient appetite is slightly better.   Objective: Vitals:   11/19/15 2305 11/20/15 0605 11/20/15 2102 11/21/15 0612  BP: (!) 93/43 (!) 108/54 126/73 124/72  Pulse: (!) 108 100 95 (!) 103  Resp: 19 18 16 16   Temp: 99.5 F (37.5 C) (!) 100.8 F (38.2 C) 98.8 F (37.1 C) 100.1 F (37.8 C)  TempSrc: Oral   Oral  SpO2: 98% 97% 98% 95%  Weight:      Height:        Intake/Output Summary (Last 24 hours) at 11/21/15 1218 Last data filed at 11/21/15 0600  Gross per 24 hour  Intake              930 ml  Output                0 ml  Net              930 ml   Filed Weights   11/07/15 0900 11/16/15 0510  Weight: 52.8 kg (116 lb 4.8 oz) 60.6 kg (133 lb 11.2 oz)    Examination:  General exam: dry MM, no oral thrush appreciated on exam, reports no vomiting or diarrhea. Has continue spiking low grade temp now. Frail underweight and chronically ill in appearance.   Respiratory system: No wheezing, diminished breath sounds  Cardiovascular system: S1 & S2 (+), tachycardic  Gastrointestinal system: (+) BS, no tenderness, no distention  Central nervous system: Nonfocal  Extremities: No swelling, palpable pulses  Skin: warm, dry Psychiatry: normal mood and behavior  Data Reviewed: I have personally reviewed  following labs and imaging studies  CBC:  Recent Labs Lab 11/15/15 1136 11/15/15 1318 11/16/15 0604 11/17/15 0110 11/18/15 0522  WBC 0.7* 0.7* 0.9* 1.0* 1.6*  NEUTROABS  --  0.4* 0.5* 0.5* 0.9*  HGB 8.0* 7.7* 8.0* 8.2* 7.6*  HCT 23.9* 23.9* 24.2* 24.2* 22.7*  MCV 90.5 91.9 91.0 91.0 91.5  PLT 79* 80* 88* 90* 115*   Basic Metabolic Panel:  Recent Labs Lab 11/16/15 0604 11/17/15 0110 11/19/15 0444 11/20/15 1206 11/21/15 0800  NA 128* 125* 129* 131* 132*  K 3.5 3.1* 2.8* 2.6* 3.6  CL 95* 95* 101 104 108  CO2 26 24 22 22 22   GLUCOSE 109* 132* 99 125* 119*  BUN 6 6 11 13 11   CREATININE 0.62 0.64 0.81 0.90 0.67  CALCIUM 7.7* 7.3* 7.1* 7.1* 7.1*  MG  --   --   --   --  2.0   GFR: Estimated Creatinine Clearance: 67.8 mL/min (by C-G formula based on SCr of 0.8 mg/dL).  Urine analysis:    Component Value Date/Time   COLORURINE  YELLOW 11/06/2015 2236   APPEARANCEUR CLEAR 11/06/2015 2236   LABSPEC 1.029 11/06/2015 2236   PHURINE 6.0 11/06/2015 2236   GLUCOSEU NEGATIVE 11/06/2015 2236   HGBUR NEGATIVE 11/06/2015 2236   BILIRUBINUR NEGATIVE 11/06/2015 2236   KETONESUR NEGATIVE 11/06/2015 2236   PROTEINUR 30 (A) 11/06/2015 2236   NITRITE NEGATIVE 11/06/2015 2236   LEUKOCYTESUR NEGATIVE 11/06/2015 2236    Recent Results (from the past 240 hour(s))  Culture, blood (Routine x 2)     Status: None (Preliminary result)   Collection Time: 11/06/15  8:30 PM  Result Value Ref Range Status   Specimen Description BLOOD LEFT ARM  Final   Special Requests IN PEDIATRIC BOTTLE 2CC  Final   Culture NO GROWTH 3 DAYS  Final   Report Status PENDING  Incomplete  Culture, blood (Routine x 2)     Status: None (Preliminary result)   Collection Time: 11/06/15  8:45 PM  Result Value Ref Range Status   Specimen Description BLOOD LEFT ARM  Final   Special Requests BOTTLES DRAWN AEROBIC AND ANAEROBIC 4CC   Final   Culture NO GROWTH 3 DAYS  Final   Report Status PENDING  Incomplete  Urine  culture     Status: None   Collection Time: 11/06/15 10:36 PM  Result Value Ref Range Status   Specimen Description URINE, CATHETERIZED  Final   Special Requests NONE  Final   Culture NO GROWTH  Final   Report Status 11/08/2015 FINAL  Final  MRSA PCR Screening     Status: None   Collection Time: 11/09/15 11:54 AM  Result Value Ref Range Status   MRSA by PCR NEGATIVE NEGATIVE Final     Radiology Studies: Dg Chest Port 1 View 11/11/2015 Low lung volumes. Subtle opacity at the left lung base, suspicious for pneumonia. Aortic atherosclerosis.   Dg Chest 1 View 11/06/2015  No acute abnormalities.   Ct Head Wo Contrast 11/07/2015  No acute intracranial process.    Marland Kitchen antiseptic oral rinse  7 mL Mouth Rinse BID  . clarithromycin  500 mg Oral Q12H  . dolutegravir  50 mg Oral Q1200  . dronabinol  2.5 mg Oral BID AC  . emtricitabine-tenofovir AF  1 tablet Oral Q1200  . ethambutol  900 mg Oral Daily  . feeding supplement (ENSURE ENLIVE)  237 mL Oral QID  . ferrous sulfate  325 mg Oral Q breakfast  . fluconazole (DIFLUCAN) IV  400 mg Intravenous Q24H  . multivitamin with minerals  1 tablet Oral Daily  . sodium chloride flush  3 mL Intravenous Q12H  . vitamin B-12  100 mcg Oral Daily  . Vitamin D (Ergocalciferol)  50,000 Units Oral Q Fri     Continuous Infusions: . dextrose 5 % and 0.9% NaCl 75 mL/hr at 11/21/15 0127     LOS: 14 days    Time spent: 25 minutes    Vassie Loll, MD [facility] Pager [phone]  If 7PM-7AM, please contact night-coverage [email or web address] Password [facility] 11/21/2015, 12:18 PM

## 2015-11-22 DIAGNOSIS — D619 Aplastic anemia, unspecified: Secondary | ICD-10-CM

## 2015-11-22 DIAGNOSIS — B3781 Candidal esophagitis: Secondary | ICD-10-CM

## 2015-11-22 DIAGNOSIS — R5381 Other malaise: Secondary | ICD-10-CM

## 2015-11-22 DIAGNOSIS — B258 Other cytomegaloviral diseases: Secondary | ICD-10-CM

## 2015-11-22 DIAGNOSIS — B259 Cytomegaloviral disease, unspecified: Secondary | ICD-10-CM

## 2015-11-22 DIAGNOSIS — K208 Other esophagitis: Secondary | ICD-10-CM

## 2015-11-22 DIAGNOSIS — E43 Unspecified severe protein-calorie malnutrition: Secondary | ICD-10-CM

## 2015-11-22 DIAGNOSIS — K209 Esophagitis, unspecified: Secondary | ICD-10-CM

## 2015-11-22 DIAGNOSIS — R627 Adult failure to thrive: Secondary | ICD-10-CM

## 2015-11-22 DIAGNOSIS — E440 Moderate protein-calorie malnutrition: Secondary | ICD-10-CM | POA: Insufficient documentation

## 2015-11-22 DIAGNOSIS — G9340 Encephalopathy, unspecified: Secondary | ICD-10-CM

## 2015-11-22 DIAGNOSIS — D61818 Other pancytopenia: Secondary | ICD-10-CM

## 2015-11-22 LAB — CULTURE, BLOOD (ROUTINE X 2)
CULTURE: NO GROWTH
Culture: NO GROWTH

## 2015-11-22 LAB — EPSTEIN BARR VRS(EBV DNA BY PCR)
EBV DNA QN by PCR: 640 copies/mL
log10 EBV DNA Qn PCR: 2.806 log10 copy/mL

## 2015-11-22 LAB — CMV DNA, QUANTITATIVE, PCR
CMV DNA QUANT: 756000 [IU]/mL
LOG10 CMV QN DNA PL: 5.879 {Log_IU}/mL

## 2015-11-22 NOTE — Progress Notes (Signed)
Spoke with Dr. Gwenlyn Perking and received order to hold all PO's and meds until seen by GI.

## 2015-11-22 NOTE — Progress Notes (Signed)
Samantha Kline 11:06 AM  Subjective: Patient seen and examined and her hospital computer chart reviewed and she has no new complaints and has not had previous swallowing problems and has not had an endoscopy before and has lost a significant amount of weight  Objective: Vital signs stable afebrile no acute distress lying still in the bed abdomen is soft nontender labs and CT and speech therapy evaluation and ID note seen  Assessment: Multiple medical problems including dysphasia  Plan: I discussed endoscopy with the patient and compared it to the colonoscopy she supposedly had in [place] and will proceed tomorrow afternoon with further workup plans and recommendations pending those findings  [facility]  Pager [phone] After 5PM or if no answer call [phone]

## 2015-11-22 NOTE — Progress Notes (Signed)
Subjective:  Not clear if she has new complaints  Antibiotics:  Anti-infectives    Start     Dose/Rate Route Frequency Ordered Stop   11/18/15 1000  dapsone tablet 100 mg  Status:  Discontinued     100 mg Oral Daily 11/08/15 1236 11/11/15 1422   11/18/15 1000  ethambutol (MYAMBUTOL) tablet 900 mg    Comments:  Adjust dose as needed   900 mg Oral Daily 11/18/15 0700     11/17/15 2200  clarithromycin (BIAXIN) tablet 500 mg    Comments:  Can use suspension if she has trouble swallowing   500 mg Oral Every 12 hours 11/17/15 1506     11/17/15 1600  ethambutol (MYAMBUTOL) tablet 900 mg  Status:  Discontinued    Comments:  Adjust dose as needed   15 mg/kg  60.6 kg Oral Daily 11/17/15 1506 11/18/15 0700   11/16/15 1000  sulfamethoxazole-trimethoprim (BACTRIM DS,SEPTRA DS) 800-160 MG per tablet 1 tablet  Status:  Discontinued     1 tablet Oral Daily 11/07/15 0952 11/08/15 1203   11/11/15 1600  fluconazole (DIFLUCAN) IVPB 400 mg     400 mg 100 mL/hr over 120 Minutes Intravenous Every 24 hours 11/11/15 1517     11/11/15 1000  azithromycin (ZITHROMAX) tablet 1,200 mg  Status:  Discontinued     1,200 mg Oral Every Fri 11/07/15 0118 11/17/15 1506   11/11/15 1000  fluconazole (DIFLUCAN) tablet 100 mg  Status:  Discontinued     100 mg Oral Every Fri 11/07/15 0118 11/11/15 1517   11/08/15 1400  clindamycin (CLEOCIN) capsule 600 mg  Status:  Discontinued     600 mg Oral Every 8 hours 11/08/15 1235 11/17/15 1506   11/08/15 1400  primaquine tablet 30 mg     30 mg Oral Daily 11/08/15 1235 11/17/15 0944   11/08/15 1215  dapsone tablet 100 mg  Status:  Discontinued     100 mg Oral Daily 11/08/15 1203 11/08/15 1234   11/07/15 1200  dolutegravir (TIVICAY) tablet 50 mg     50 mg Oral Daily 11/07/15 0118     11/07/15 1200  emtricitabine-tenofovir AF (DESCOVY) 200-25 MG per tablet 1 tablet     1 tablet Oral Daily 11/07/15 0118     11/07/15 1030  sulfamethoxazole-trimethoprim (BACTRIM  DS,SEPTRA DS) 800-160 MG per tablet 2 tablet  Status:  Discontinued     2 tablet Oral 3 times daily 11/07/15 0952 11/08/15 1203   11/07/15 0115  sulfamethoxazole-trimethoprim (BACTRIM DS,SEPTRA DS) 800-160 MG per tablet 1-23 tablet  Status:  Discontinued     1-23 tablet Oral See admin instructions 11/07/15 0118 11/07/15 0948      Medications: Scheduled Meds: . antiseptic oral rinse  7 mL Mouth Rinse BID  . clarithromycin  500 mg Oral Q12H  . dolutegravir  50 mg Oral Q1200  . dronabinol  2.5 mg Oral BID AC  . emtricitabine-tenofovir AF  1 tablet Oral Q1200  . ethambutol  900 mg Oral Daily  . feeding supplement (ENSURE ENLIVE)  237 mL Oral QID  . ferrous sulfate  325 mg Oral Q breakfast  . fluconazole (DIFLUCAN) IV  400 mg Intravenous Q24H  . multivitamin with minerals  1 tablet Oral Daily  . sodium chloride flush  3 mL Intravenous Q12H  . vitamin B-12  100 mcg Oral Daily  . Vitamin D (Ergocalciferol)  50,000 Units Oral Q Fri   Continuous Infusions: . dextrose 5 % and  0.9% NaCl 75 mL/hr at 11/22/15 0635   PRN Meds:.acetaminophen **OR** acetaminophen, albuterol, dextromethorphan-guaiFENesin, metoprolol, ondansetron, prochlorperazine    Objective: Weight change:   Intake/Output Summary (Last 24 hours) at 11/22/15 1954 Last data filed at 11/22/15 1610  Gross per 24 hour  Intake              903 ml  Output                0 ml  Net              903 ml   Blood pressure 111/70, pulse 94, temperature 99 F (37.2 C), temperature source Oral, resp. rate 18, height 5\' 4"  (1.626 m), weight 133 lb 11.2 oz (60.6 kg), SpO2 100 %. Temp:  [98.4 F (36.9 C)-99 F (37.2 C)] 99 F (37.2 C) (08/01 0528) Pulse Rate:  [94-98] 94 (08/01 0528) Resp:  [18] 18 (08/01 0528) BP: (111-112)/(66-70) 111/70 (08/01 0528) SpO2:  [92 %-100 %] 100 % (08/01 0528)  Physical Exam: General: weak and fairly lethargic, darkening of tongue HEENT: anicteric sclera, pupils reactive to light and accommodation,  EOMI CVS tachycardic rate, normal r,  no murmur rubs or gallops Chest: clear to auscultation bilaterally, no wheezing, rales or rhonchi Abdomen: soft not especially tender, nondistended, normal bowel sounds, Extremities: no  clubbing or edema noted bilaterally Skin: no rashes Lymph: no new lymphadenopathy Neuro: nonfocal  CBC: CBC Latest Ref Rng & Units 11/18/2015 11/17/2015 11/16/2015  WBC 4.0 - 10.5 K/uL 1.6(L) 1.0(LL) 0.9(LL)  Hemoglobin 12.0 - 15.0 g/dL 7.6(L) 8.2(L) 8.0(L)  Hematocrit 36.0 - 46.0 % 22.7(L) 24.2(L) 24.2(L)  Platelets 150 - 400 K/uL 115(L) 90(L) 88(L)      BMET  Recent Labs  11/20/15 1206 11/21/15 0800  NA 131* 132*  K 2.6* 3.6  CL 104 108  CO2 22 22  GLUCOSE 125* 119*  BUN 13 11  CREATININE 0.90 0.67  CALCIUM 7.1* 7.1*     Liver Panel  No results for input(s): PROT, ALBUMIN, AST, ALT, ALKPHOS, BILITOT, BILIDIR, IBILI in the last 72 hours.     Sedimentation Rate No results for input(s): ESRSEDRATE in the last 72 hours. C-Reactive Protein No results for input(s): CRP in the last 72 hours.  Micro Results: Recent Results (from the past 720 hour(s))  Blood culture (routine x 2)     Status: None   Collection Time: 10/24/15  3:10 AM  Result Value Ref Range Status   Specimen Description BLOOD LEFT ARM  Final   Special Requests BOTTLES DRAWN AEROBIC AND ANAEROBIC 5 ML  Final   Culture   Final    NO GROWTH 5 DAYS Performed at [facility]    Report Status 10/29/2015 FINAL  Final  Blood culture (routine x 2)     Status: None   Collection Time: 10/24/15  3:19 AM  Result Value Ref Range Status   Specimen Description BLOOD RIGHT ARM  Final   Special Requests BOTTLES DRAWN AEROBIC AND ANAEROBIC 5 ML  Final   Culture   Final    NO GROWTH 5 DAYS Performed at [facility]    Report Status 10/29/2015 FINAL  Final  Culture, sputum-assessment     Status: None   Collection Time: 10/24/15 12:10 PM  Result Value Ref Range Status    Specimen Description SPUTUM  Final   Special Requests NONE  Final   Sputum evaluation   Final    THIS SPECIMEN IS ACCEPTABLE. RESPIRATORY CULTURE  REPORT TO FOLLOW.   Report Status 10/24/2015 FINAL  Final  Culture, respiratory (NON-Expectorated)     Status: None   Collection Time: 10/24/15 12:10 PM  Result Value Ref Range Status   Specimen Description SPUTUM  Final   Special Requests NONE  Final   Gram Stain   Final    ABUNDANT WBC PRESENT, PREDOMINANTLY PMN RARE SQUAMOUS EPITHELIAL CELLS PRESENT FEW GRAM POSITIVE COCCI IN PAIRS AND CHAINS RARE GRAM NEGATIVE RODS RARE GRAM POSITIVE RODS    Culture Consistent with normal respiratory flora.  Final   Report Status 10/26/2015 FINAL  Final  Pneumocystis smear by DFA     Status: None   Collection Time: 10/28/15  9:09 PM  Result Value Ref Range Status   Specimen Source-PJSRC SPUTUM  Final   Pneumocystis jiroveci Ag NEGATIVE  Final    Comment: Performed at [facility]  Culture, blood (Routine x 2)     Status: None   Collection Time: 11/06/15  8:30 PM  Result Value Ref Range Status   Specimen Description BLOOD LEFT ARM  Final   Special Requests IN PEDIATRIC BOTTLE 2CC  Final   Culture NO GROWTH 5 DAYS  Final   Report Status 11/11/2015 FINAL  Final  Culture, blood (Routine x 2)     Status: None   Collection Time: 11/06/15  8:45 PM  Result Value Ref Range Status   Specimen Description BLOOD LEFT ARM  Final   Special Requests BOTTLES DRAWN AEROBIC AND ANAEROBIC 4CC   Final   Culture NO GROWTH 5 DAYS  Final   Report Status 11/11/2015 FINAL  Final  Urine culture     Status: None   Collection Time: 11/06/15 10:36 PM  Result Value Ref Range Status   Specimen Description URINE, CATHETERIZED  Final   Special Requests NONE  Final   Culture NO GROWTH  Final   Report Status 11/08/2015 FINAL  Final  MRSA PCR Screening     Status: None   Collection Time: 11/09/15 11:54 AM  Result Value Ref Range Status   MRSA by PCR NEGATIVE  NEGATIVE Final    Comment:        The GeneXpert MRSA Assay (FDA approved for NASAL specimens only), is one component of a comprehensive MRSA colonization surveillance program. It is not intended to diagnose MRSA infection nor to guide or monitor treatment for MRSA infections.   Culture, blood (routine x 2)     Status: None   Collection Time: 11/11/15  4:50 PM  Result Value Ref Range Status   Specimen Description BLOOD LEFT ANTECUBITAL  Final   Special Requests BOTTLES DRAWN AEROBIC ONLY 5CC  Final   Culture NO GROWTH 5 DAYS  Final   Report Status 11/16/2015 FINAL  Final  Culture, blood (routine x 2)     Status: None   Collection Time: 11/11/15  4:55 PM  Result Value Ref Range Status   Specimen Description BLOOD BLOOD LEFT ARM  Final   Special Requests IN PEDIATRIC BOTTLE 1CC  Final   Culture NO GROWTH 5 DAYS  Final   Report Status 11/16/2015 FINAL  Final  Acid Fast Smear (AFB)     Status: None   Collection Time: 11/11/15  8:11 PM  Result Value Ref Range Status   AFB Specimen Processing Concentration  Final    Comment: (NOTE) Performed At: [facility] [address] [place], [place] [id number] Mila Homer MD YN:[id number]    Acid Fast  Smear NOSMR  Final    Comment: (NOTE) The acid-fast bacilli (AFB) smear will not be performed due to the specimen source (blood) or submission of an insufficient quantity of specimen.    Source (AFB) BLOOD  Corrected  Culture, blood (routine x 2)     Status: None   Collection Time: 11/17/15  1:10 AM  Result Value Ref Range Status   Specimen Description BLOOD RIGHT ANTECUBITAL  Final   Special Requests BOTTLES DRAWN AEROBIC AND ANAEROBIC 10CC,5CC  Final   Culture NO GROWTH 5 DAYS  Final   Report Status 11/22/2015 FINAL  Final  Culture, blood (routine x 2)     Status: None   Collection Time: 11/17/15  1:23 AM  Result Value Ref Range Status   Specimen Description BLOOD RIGHT HAND  Final   Special Requests IN PEDIATRIC  BOTTLE 3CC  Final   Culture NO GROWTH 5 DAYS  Final   Report Status 11/22/2015 FINAL  Final    Studies/Results: No results found.    Assessment/Plan:  INTERVAL HISTORY:   Dysphagia persists    Principal Problem:   Hyponatremia Active Problems:   Human immunodeficiency virus (HIV) disease (HCC)   CAP (community acquired pneumonia)   Protein-calorie malnutrition, severe (HCC)   Acute encephalopathy   Hyperkalemia   Nausea & vomiting   Pressure ulcer   Elevated lactic acid level   Arterial hypotension   Tachypnea   Tachycardia   Anemia of chronic disease   FUO (fever of unknown origin)   Candida infection, esophageal (HCC)   AIDS (HCC)   Physical deconditioning   Malnutrition of moderate degree    Samantha Kline is a 55 y.o. female with  Newly dx HIV/AIDS rx for PCP admitted with FTT, esophagitis, fever, started on empiric rx for M avium, fluconazole for possible esophageal candidiasis.  #1 Dysphagia:  Greatly appreciate Dr. Ewing Schlein seeing the patient again and patient for EGD in am  Please send tissue for   CMV PCR HSV PCR   (latter ones should be in viral transport media--NOT PATH jar with formalin)  Tissue to  pathplogy  Continue fluconazole and follouwp results from EGD  #2 Fevers: IN context of her pancytopenia I think the decision to start M avium therapy makes perfect sense  #3 HIV/AIDS: she has started Tivicay and Descovy a regimen that should afford her rapid virological suppression while it will give higher risk for IRIS and indeed her VL has dropped from 200K to 200 in only 3 weeks!  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  I expect we may find pathology in her  esophagus.   We will also need to inquire if she is suffering from visual problems  Finally we may want to consider LP   For opening pressure  CSF for CMV DNA by PCR CSF cell count and differential CSF glucose and protein CSF cryptococcal antigen CSF toxoplasma PCR CSF culture  Along with MRI of brain with contrast  If we go to [place] we will have to support her bone marrow with G CSF  #5 Failure to thrive: multifactorial. Hopefully she can ultimately participate in rehab I am  very leery of her going to SNF given some recent experiences I have had with very frail patients with HIV/AIDS           LOS: 15 days   Acey Lav 11/22/2015, 7:54 PM

## 2015-11-22 NOTE — Progress Notes (Addendum)
Patient ID: Samantha Kline, female   DOB: [birth year], 55 y.o.   MRN: [id number]  PROGRESS NOTE    Akirah Storck  QMV:[id number] DOB: 08/19/1960 DOA: 11/06/2015  PCP: No PCP Per Patient   Brief Narrative:  55 y.o. female with past medical history significant for recently diagnosed HIV, recent hospitalization from 10/24/2015 through 10/31/2015 for new diagnosis of HIV pneumonia. Patient was subsequently discharged on Bactrim and prednisone with concerns for pneumocystis pneumonia. Apparently patient was doing well for couple of days after discharge but then started to decline with generalized weakness, loss of appetite, nausea, vomiting and confusion. Patient's family reported no blood with vomiting. No diarrhea. No fevers or chills.  In ED, patient was hemodynamically stable. Blood work was notable for sodium of 110, potassium 5.3, bicarbonate 18, glucose 161. Lactic acid was 2.65 but subsequently normalized. CT head showed no acute intracranial process. Chest x-ray showed no acute abnormalities.  Barrier to discharge: Ongoing fever, leukopenia and difficulty swallowing. ID following. Will follow recommendations. Patient continue Physically declining and with poor intake. Will follow clinical response. Unable to estimate stability for discharge currently.   Assessment & Plan: Acute metabolic encephalopathy / hypoosmolar hyponatremia -Likely secondary to severe hyponatremia. Sodium was as low as 110 on the admission -Hyponatremia presumed to be secondary to lungs infection and also likely because of Bactrim -No acute findings on CT head -Sodium 128-132 now and stable -will monitor intermittently   Fever of unknown origin - Ongoing fever despite multiple abx. Possible PCP infection, [facility] (last one favored) - Blood cultures on admission and repeat cultures has remained negative - Most recent CXR showed low lung volumes and opacity at the left base (no new infiltrates)  -treatment empirically for  MAI to be started today as per ID rec's. Biaxin and ethambutol. Appreciate their assistance.  Oral thrush / Dysphagia: and presumed esophageal candidiasis  - Continue fluconazole, dose increased per ID to 400 mg instead of 100 mg daily. She was started on fluconazole 400 mg daily and per ID to continue for 21 days todal - Per SLP, meds with puree and thin liquids, dysphagia 2 diet - She has good and bad days with swallowing but seems to overall been improving - GI re-consulted, given lack of improvement and continue with difficulty swallowing. 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   - completed treatment for PCP with prednisone, clindamycin and primaquine. - Continue Dapsone for prophylaxis - following ID rec's and giving ongoing fever and concerns for MAI, will be treated with biaxin and ethambutol  -CMV antibody was positive; will follow load (which is pending) to determine needs for treatment.  Anemia of chronic disease -Hemoglobin ranges 7.6 - 8.5 range -no signs of acute bleeding -will follow trend and transfuse for Hgb < 7  Leukopenia / ?neutropenia  - Likely from HIV  - no growth on further cx's - patient has completed active antibiotic treatment and currently is receiving treatment for HIV - will follow cell line trend  -no signs of acute bleeding   Hyperkalemia / Hypokalemia  - Secondary to Bactrim - She was given Kayexalate at some point; now with low K intermittently - will replete as needed (most likely from poor PO and GI loses) - will follow electrolytes and replete as needed -Mg WNL  Severe protein calorie malnutrition -In the context of chronic illness -Continue ensure supplementation and encouraged to increase PO  intake -advancing diet as tolerated -started on megace by ID rec's  Stage 1 decubitus pressure ulcer -continue constant repositioning and preventive  measures/barrier creams  DVT prophylaxis: Stop Lovenox and use SCD's bilaterally due to drop in platelets over past 2 days 102 --> 79 Code Status: full code  Family Communication: no family at bedside Disposition Plan: continue having low grade temp intermittently, assessing with ID for stability for discharge. Plan is for CIR vs SNF   Consultants:   ID  SLP  GI, [facility], consulted 7/25  Procedures:   None   Antimicrobials:   HAART  Dapsone 11/08/2015 -->  Clindamycin 11/08/2015 -->completed course   Primaquine 11/08/2015 -->completed course   Fluconazole 11/11/2015 --> planning to treat for 21 days total   Azithromycin >>>stopped on 7/27 after treatment for MAI started   Biaxin 7/27>>>  Ethambutol 7/27>>>   Subjective: No CP, no SOB. Continue having low grade temp intermittently and even patient reports some improvement in appetite; continue having difficulty with dysphagia.  Objective: Vitals:   11/20/15 2102 11/21/15 0612 11/21/15 2154 11/22/15 0528  BP: 126/73 124/72 112/66 111/70  Pulse: 95 (!) 103 98 94  Resp: 16 16 18 18   Temp: 98.8 F (37.1 C) 100.1 F (37.8 C) 98.4 F (36.9 C) 99 F (37.2 C)  TempSrc:  Oral Oral Oral  SpO2: 98% 95% 92% 100%  Weight:      Height:        Intake/Output Summary (Last 24 hours) at 11/22/15 1505 Last data filed at 11/22/15 0624  Gross per 24 hour  Intake              903 ml  Output                0 ml  Net              903 ml   Filed Weights   11/07/15 0900 11/16/15 0510  Weight: 52.8 kg (116 lb 4.8 oz) 60.6 kg (133 lb 11.2 oz)    Examination:  General exam: dry MM, no oral thrush appreciated on oral exam, reports no vomiting or diarrhea. Has continue intermittent episodes of low grade temp (much improved overall). Frail, underweight and chronically ill in appearance.   Respiratory system: No wheezing, diminished breath sounds  Cardiovascular system: S1 & S2 (+), tachycardic  Gastrointestinal system: (+) BS,  no tenderness, no distention  Central nervous system: Nonfocal  Extremities: No swelling, palpable pulses  Skin: warm, dry, stage 1 pressure ulcer in her decubitus area  Psychiatry: normal mood and behavior  Data Reviewed: I have personally reviewed following labs and imaging studies  CBC:  Recent Labs Lab 11/16/15 0604 11/17/15 0110 11/18/15 0522  WBC 0.9* 1.0* 1.6*  NEUTROABS 0.5* 0.5* 0.9*  HGB 8.0* 8.2* 7.6*  HCT 24.2* 24.2* 22.7*  MCV 91.0 91.0 91.5  PLT 88* 90* 115*   Basic Metabolic Panel:  Recent Labs Lab 11/16/15 0604 11/17/15 0110 11/19/15 0444 11/20/15 1206 11/21/15 0800  NA 128* 125* 129* 131* 132*  K 3.5 3.1* 2.8* 2.6* 3.6  CL 95* 95* 101 104 108  CO2 26 24 22 22 22   GLUCOSE 109* 132* 99 125* 119*  BUN 6 6 11 13 11   CREATININE 0.62 0.64 0.81 0.90 0.67  CALCIUM 7.7* 7.3* 7.1* 7.1* 7.1*  MG  --   --   --   --  2.0   GFR: Estimated Creatinine Clearance: 67.8 mL/min (by C-G formula based on SCr of  0.8 mg/dL).  Urine analysis:    Component Value Date/Time   COLORURINE YELLOW 11/06/2015 2236   APPEARANCEUR CLEAR 11/06/2015 2236   LABSPEC 1.029 11/06/2015 2236   PHURINE 6.0 11/06/2015 2236   GLUCOSEU NEGATIVE 11/06/2015 2236   HGBUR NEGATIVE 11/06/2015 2236   BILIRUBINUR NEGATIVE 11/06/2015 2236   KETONESUR NEGATIVE 11/06/2015 2236   PROTEINUR 30 (A) 11/06/2015 2236   NITRITE NEGATIVE 11/06/2015 2236   LEUKOCYTESUR NEGATIVE 11/06/2015 2236    Recent Results (from the past 240 hour(s))  Culture, blood (Routine x 2)     Status: None (Preliminary result)   Collection Time: 11/06/15  8:30 PM  Result Value Ref Range Status   Specimen Description BLOOD LEFT ARM  Final   Special Requests IN PEDIATRIC BOTTLE 2CC  Final   Culture NO GROWTH 3 DAYS  Final   Report Status PENDING  Incomplete  Culture, blood (Routine x 2)     Status: None (Preliminary result)   Collection Time: 11/06/15  8:45 PM  Result Value Ref Range Status   Specimen Description  BLOOD LEFT ARM  Final   Special Requests BOTTLES DRAWN AEROBIC AND ANAEROBIC 4CC   Final   Culture NO GROWTH 3 DAYS  Final   Report Status PENDING  Incomplete  Urine culture     Status: None   Collection Time: 11/06/15 10:36 PM  Result Value Ref Range Status   Specimen Description URINE, CATHETERIZED  Final   Special Requests NONE  Final   Culture NO GROWTH  Final   Report Status 11/08/2015 FINAL  Final  MRSA PCR Screening     Status: None   Collection Time: 11/09/15 11:54 AM  Result Value Ref Range Status   MRSA by PCR NEGATIVE NEGATIVE Final     Radiology Studies: Dg Chest Port 1 View 11/11/2015 Low lung volumes. Subtle opacity at the left lung base, suspicious for pneumonia. Aortic atherosclerosis.   Dg Chest 1 View 11/06/2015  No acute abnormalities.   Ct Head Wo Contrast 11/07/2015  No acute intracranial process.    Marland Kitchen antiseptic oral rinse  7 mL Mouth Rinse BID  . clarithromycin  500 mg Oral Q12H  . dolutegravir  50 mg Oral Q1200  . dronabinol  2.5 mg Oral BID AC  . emtricitabine-tenofovir AF  1 tablet Oral Q1200  . ethambutol  900 mg Oral Daily  . feeding supplement (ENSURE ENLIVE)  237 mL Oral QID  . ferrous sulfate  325 mg Oral Q breakfast  . fluconazole (DIFLUCAN) IV  400 mg Intravenous Q24H  . multivitamin with minerals  1 tablet Oral Daily  . sodium chloride flush  3 mL Intravenous Q12H  . vitamin B-12  100 mcg Oral Daily  . Vitamin D (Ergocalciferol)  50,000 Units Oral Q Fri     Continuous Infusions: . dextrose 5 % and 0.9% NaCl 75 mL/hr at 11/22/15 0635     LOS: 15 days    Time spent: 25 minutes    Vassie Loll, MD [facility] Pager [phone]  If 7PM-7AM, please contact night-coverage [email or web address] Password [email or web address] 11/22/2015, 3:05 PM

## 2015-11-22 NOTE — Progress Notes (Signed)
I continue to follow pt's progress with therapy and the continued issues for swallowing,, med administration and poor po intake. Pt not medically ready to proceed for insurance approval for possible admission to inpt rehab. [phone]

## 2015-11-22 NOTE — Progress Notes (Signed)
Physical Therapy Treatment Patient Details Name: Samantha Kline MRN: [id number] DOB: 01-05-1961 Today's Date: 11/22/2015    History of Present Illness 55 y.o. female with medical history significant of recently diagnosed HIV, recently treated pneumonia, who presents with altered mental status, generalized weakness, nausea and vomiting.Head CT and CXR negative. 50 lb unintentional weight loss over the past 5 - 6 months along with decreased appetite.     PT Comments    Pt continues to require +2 with mobility, but did demonstrate improved sitting balance and required decreased A level with bed mobility today compared to last session.  Pt educated on seated LE therex to work on Print production planner.   Follow Up Recommendations  CIR     Equipment Recommendations  Other (comment) (TBD)    Recommendations for Other Services       Precautions / Restrictions Precautions Precautions: Fall Restrictions Weight Bearing Restrictions: No    Mobility  Bed Mobility Overal bed mobility: Needs Assistance Bed Mobility: Rolling;Sidelying to Sit Rolling: Mod assist Sidelying to sit: Mod assist;+2 for physical assistance       General bed mobility comments: Pt able to assist with initiating movement, but needed A for roll and to get legs off bed and trunk upright  Transfers Overall transfer level: Needs assistance Equipment used: 2 person hand held assist Transfers: Sit to/from [employer] Sit to Stand: Mod assist;+2 physical assistance Stand pivot transfers: Mod assist;+2 physical assistance       General transfer comment: HHA on each side with cues for hand placement.  Did not need to block knees today and with cueing she was able to take small steps during SPT  Ambulation/Gait                 Stairs            Wheelchair Mobility    Modified Rankin (Stroke Patients Only)       Balance   Sitting-balance support: Feet supported Sitting balance-Leahy  Scale: Poor Sitting balance - Comments: FAIR STATIC; POOR DYNAMIC Postural control: Posterior lean Standing balance support: Bilateral upper extremity supported Standing balance-Leahy Scale: Poor Standing balance comment: HHA of 2                    Cognition Arousal/Alertness: Awake/alert Behavior During Therapy: Flat affect Overall Cognitive Status: Impaired/Different from baseline Area of Impairment: Problem solving;Attention;Memory;Orientation;Awareness     Memory: Decreased short-term memory Following Commands: Follows one step commands consistently Safety/Judgement: Decreased awareness of safety Awareness: Emergent Problem Solving: Slow processing;Difficulty sequencing;Requires tactile cues;Requires verbal cues      Exercises General Exercises - Lower Extremity Ankle Circles/Pumps: AROM;Both;10 reps Long Arc Quad: Strengthening;Both;5 reps Hip ABduction/ADduction: Strengthening;Both;5 reps Hip Flexion/Marching: Strengthening;Both;5 reps;Seated    General Comments        Pertinent Vitals/Pain Pain Assessment: No/denies pain    Home Living                      Prior Function            PT Goals (current goals can now be found in the care plan section) Acute Rehab PT Goals Patient Stated Goal: None stated PT Goal Formulation: With patient Time For Goal Achievement: 12/06/15 Potential to Achieve Goals: Fair Progress towards PT goals: Progressing toward goals    Frequency  Min 3X/week    PT Plan Current plan remains appropriate    Co-evaluation  End of Session Equipment Utilized During Treatment: Gait belt Activity Tolerance: Patient limited by fatigue Patient left: in chair;with call bell/phone within reach;with chair alarm set     Time: 1101-1120 PT Time Calculation (min) (ACUTE ONLY): 19 min  Charges:  $Therapeutic Activity: 8-22 mins                    G Codes:      Dajane Valli LUBECK 11/22/2015, 12:21  PM

## 2015-11-22 NOTE — Progress Notes (Signed)
Nutrition Follow-up  DOCUMENTATION CODES:   Non-severe (moderate) malnutrition in context of chronic illness  INTERVENTION:   -RD will follow for diet advancement and resume supplements as able -If nutrition support is pursued, recommend:  Initiate Osmolite 1.2 @ 20 ml/hr and increase by 10 ml every 12 hours to goal rate of 60 ml/hr.   Tube feeding regimen provides 1728 kcal (100% of needs), 80 grams of protein, and 1181 ml of H2O.   -Also recommend checking K, Mg, and Phos daily x 3 days once feeding is started, due to pt being at high risk of developing refeeding syndrome  NUTRITION DIAGNOSIS:   Malnutrition related to chronic illness as evidenced by moderate depletion of body fat, moderate depletions of muscle mass.  Ongoing  GOAL:   Patient will meet greater than or equal to 90% of their needs  Unmet  MONITOR:   Diet advancement, PO intake, Supplement acceptance, Labs, Weight trends, I & O's  REASON FOR ASSESSMENT:   Consult Assessment of nutrition requirement/status  ASSESSMENT:   Patient was recently hospitalized from 7/3-7/10 and had new diagnosis of HIV and pneumonia. She was started with anti-HIV medications. Patient was discharged on bactrim and prednisone with concerns of PCP. Per husband, pt was doing well intially, but 4 days ago she started to decline. Pt developed increased generalized weakness, loss of appetite, decreased oral intake, nauseas, vomiting and confusion. She vomited twice without blood in the vomitus today. Patient does not have abdominal pain or diarrhea. She was initially very confused with AMS, which improved with IV NS bolus, and became oriented x 3. She continues to have dry cough and mild shortness of breath, but no chest pain. No fever or chills. Patient denies symptoms of UTI or unilateral weakness. When I saw pt in ED, she is oriented x 3 and looks very weak, moves all extremities. No active nausea, vomiting, abdominal pain or chest  pain.  SLP continues to work with pt. Per chart review, pt's swallow function has become progressively worse. Per SLP notes, pt holding oral secretions in mouth and requires maximum verbal and tactile cues. Diet has been held until GI can see pt. Noted meal intake continues to be poor; PO: 0-10%.  Per GI notes, plan for endoscopy on 11/23/15. Noted recommendations for palliative care consult vs nutrition support (temporary feeding tube vs PEG). RD will provide recommendations in the event nutrition support is pursued.   Medications reviewed and included marinol, diflucan, vitamin B-12, MVI, and vitamin D.   Labs reviewed: Na: 132 (on IV supplementation).   Diet Order:  Diet NPO time specified Except for: Sips with Meds Diet full liquid Room service appropriate? Yes; Fluid consistency: Thin  Skin:  Wound (see comment) (stage II sacrum, HIV lesions)  Last BM:  11/20/15  Height:   Ht Readings from Last 1 Encounters:  11/07/15 5\' 4"  (1.626 m)    Weight:   Wt Readings from Last 1 Encounters:  11/16/15 133 lb 11.2 oz (60.6 kg)    Ideal Body Weight:  54.5 kg  BMI:  Body mass index is 22.95 kg/m.  Estimated Nutritional Needs:   Kcal:  1600-1800  Protein:  80-95 grams  Fluid:  1.6-1.8 L  EDUCATION NEEDS:   No education needs identified at this time  Kristyne Woodring A. Mayford Knife, RD, LDN, CDE Pager: [phone] After hours Pager: [phone]

## 2015-11-23 ENCOUNTER — Encounter (HOSPITAL_COMMUNITY)
Admission: EM | Disposition: A | Discharge status: Rehabilitation Facility | Payer: Self-pay | Source: Home / Self Care | Attending: Internal Medicine

## 2015-11-23 ENCOUNTER — Inpatient Hospital Stay (HOSPITAL_COMMUNITY): Payer: Managed Care, Other (non HMO) | Admitting: Certified Registered"

## 2015-11-23 ENCOUNTER — Encounter (HOSPITAL_COMMUNITY): Payer: Self-pay | Admitting: *Deleted

## 2015-11-23 DIAGNOSIS — B259 Cytomegaloviral disease, unspecified: Secondary | ICD-10-CM | POA: Diagnosis present

## 2015-11-23 DIAGNOSIS — K208 Other esophagitis: Secondary | ICD-10-CM

## 2015-11-23 DIAGNOSIS — E440 Moderate protein-calorie malnutrition: Secondary | ICD-10-CM

## 2015-11-23 DIAGNOSIS — B258 Other cytomegaloviral diseases: Secondary | ICD-10-CM

## 2015-11-23 HISTORY — PX: ESOPHAGOGASTRODUODENOSCOPY: SHX5428

## 2015-11-23 LAB — BASIC METABOLIC PANEL
Anion gap: 4 — ABNORMAL LOW (ref 5–15)
BUN: 8 mg/dL (ref 6–20)
CALCIUM: 6.9 mg/dL — AB (ref 8.9–10.3)
CHLORIDE: 109 mmol/L (ref 101–111)
CO2: 22 mmol/L (ref 22–32)
CREATININE: 0.71 mg/dL (ref 0.44–1.00)
Glucose, Bld: 210 mg/dL — ABNORMAL HIGH (ref 65–99)
Potassium: 3 mmol/L — ABNORMAL LOW (ref 3.5–5.1)
SODIUM: 135 mmol/L (ref 135–145)

## 2015-11-23 LAB — CRYPTOCOCCAL ANTIGEN: CRYPTO AG: NEGATIVE

## 2015-11-23 SURGERY — EGD (ESOPHAGOGASTRODUODENOSCOPY)
Anesthesia: Monitor Anesthesia Care

## 2015-11-23 MED ORDER — SODIUM CHLORIDE 0.9 % IV SOLN
2.5000 mg/kg | Freq: Two times a day (BID) | INTRAVENOUS | Status: DC
  Administered 2015-11-24 – 2015-11-25 (×3): 150 mg via INTRAVENOUS
  Filled 2015-11-23 (×5): qty 150

## 2015-11-23 MED ORDER — TBO-FILGRASTIM 300 MCG/0.5ML ~~LOC~~ SOSY
300.0000 ug | PREFILLED_SYRINGE | SUBCUTANEOUS | Status: DC
  Administered 2015-11-23 – 2015-11-24 (×2): 300 ug via SUBCUTANEOUS
  Filled 2015-11-23 (×2): qty 0.5

## 2015-11-23 MED ORDER — POTASSIUM CHLORIDE 10 MEQ/100ML IV SOLN
10.0000 meq | INTRAVENOUS | Status: AC
  Administered 2015-11-23 (×3): 10 meq via INTRAVENOUS
  Filled 2015-11-23 (×3): qty 100

## 2015-11-23 MED ORDER — LACTATED RINGERS IV SOLN
INTRAVENOUS | Status: DC | PRN
  Administered 2015-11-23: 15:00:00 via INTRAVENOUS

## 2015-11-23 MED ORDER — PROPOFOL 500 MG/50ML IV EMUL
INTRAVENOUS | Status: DC | PRN
  Administered 2015-11-23: 100 ug/kg/min via INTRAVENOUS

## 2015-11-23 MED ORDER — SODIUM CHLORIDE 0.9 % IV SOLN: INTRAVENOUS | Status: DC

## 2015-11-23 MED ORDER — SODIUM CHLORIDE 0.9 % IV SOLN
2.5000 mg/kg | Freq: Two times a day (BID) | INTRAVENOUS | Status: DC
  Filled 2015-11-23 (×2): qty 150

## 2015-11-23 NOTE — Anesthesia Procedure Notes (Signed)
Procedure Name: MAC Date/Time: 11/23/2015 2:45 PM Performed by: Sheppard Evens Pre-anesthesia Checklist: Patient identified, Emergency Drugs available, Suction available, Patient being monitored and Timeout performed Patient Re-evaluated:Patient Re-evaluated prior to inductionOxygen Delivery Method: Nasal cannula

## 2015-11-23 NOTE — Anesthesia Preprocedure Evaluation (Signed)
Anesthesia Evaluation  Patient identified by MRN, date of birth, ID band Patient awake    Reviewed: Allergy & Precautions, NPO status , Patient's Chart, lab work & pertinent test results  Airway Mallampati: III     Mouth opening: Limited Mouth Opening  Dental  (+) Teeth Intact, Dental Advisory Given, Poor Dentition   Pulmonary    breath sounds clear to auscultation       Cardiovascular negative cardio ROS   Rhythm:Regular Rate:Normal     Neuro/Psych PSYCHIATRIC DISORDERS negative neurological ROS     GI/Hepatic negative GI ROS, Neg liver ROS,   Endo/Other  negative endocrine ROS  Renal/GU CRFRenal disease  negative genitourinary   Musculoskeletal negative musculoskeletal ROS (+)   Abdominal Normal abdominal exam  (+)   Peds negative pediatric ROS (+)  Hematology   Anesthesia Other Findings - HIV   Reproductive/Obstetrics negative OB ROS                             Lab Results  Component Value Date   WBC 1.6 (L) 11/18/2015   HGB 7.6 (L) 11/18/2015   HCT 22.7 (L) 11/18/2015   MCV 91.5 11/18/2015   PLT 115 (L) 11/18/2015   Lab Results  Component Value Date   CREATININE 0.71 11/23/2015   BUN 8 11/23/2015   NA 135 11/23/2015   K 3.0 (L) 11/23/2015   CL 109 11/23/2015   CO2 22 11/23/2015   Lab Results  Component Value Date   INR 1.74 (H) 11/07/2015   INR 1.11 10/26/2015   INR 1.08 10/26/2015     Anesthesia Physical Anesthesia Plan  ASA: III  Anesthesia Plan: MAC   Post-op Pain Management:    Induction: Intravenous  Airway Management Planned: Natural Airway  Additional Equipment:   Intra-op Plan:   Post-operative Plan:   Informed Consent: I have reviewed the patients History and Physical, chart, labs and discussed the procedure including the risks, benefits and alternatives for the proposed anesthesia with the patient or authorized representative who has  indicated his/her understanding and acceptance.     Plan Discussed with: CRNA  Anesthesia Plan Comments:         Anesthesia Quick Evaluation

## 2015-11-23 NOTE — Progress Notes (Signed)
Subjective:  No new complaints  Antibiotics:  Anti-infectives    Start     Dose/Rate Route Frequency Ordered Stop   11/18/15 1000  dapsone tablet 100 mg  Status:  Discontinued     100 mg Oral Daily 11/08/15 1236 11/11/15 1422   11/18/15 1000  ethambutol (MYAMBUTOL) tablet 900 mg    Comments:  Adjust dose as needed   900 mg Oral Daily 11/18/15 0700     11/17/15 2200  clarithromycin (BIAXIN) tablet 500 mg    Comments:  Can use suspension if she has trouble swallowing   500 mg Oral Every 12 hours 11/17/15 1506     11/17/15 1600  ethambutol (MYAMBUTOL) tablet 900 mg  Status:  Discontinued    Comments:  Adjust dose as needed   15 mg/kg  60.6 kg Oral Daily 11/17/15 1506 11/18/15 0700   11/16/15 1000  sulfamethoxazole-trimethoprim (BACTRIM DS,SEPTRA DS) 800-160 MG per tablet 1 tablet  Status:  Discontinued     1 tablet Oral Daily 11/07/15 0952 11/08/15 1203   11/11/15 1600  fluconazole (DIFLUCAN) IVPB 400 mg     400 mg 100 mL/hr over 120 Minutes Intravenous Every 24 hours 11/11/15 1517     11/11/15 1000  azithromycin (ZITHROMAX) tablet 1,200 mg  Status:  Discontinued     1,200 mg Oral Every Fri 11/07/15 0118 11/17/15 1506   11/11/15 1000  fluconazole (DIFLUCAN) tablet 100 mg  Status:  Discontinued     100 mg Oral Every Fri 11/07/15 0118 11/11/15 1517   11/08/15 1400  clindamycin (CLEOCIN) capsule 600 mg  Status:  Discontinued     600 mg Oral Every 8 hours 11/08/15 1235 11/17/15 1506   11/08/15 1400  primaquine tablet 30 mg     30 mg Oral Daily 11/08/15 1235 11/17/15 0944   11/08/15 1215  dapsone tablet 100 mg  Status:  Discontinued     100 mg Oral Daily 11/08/15 1203 11/08/15 1234   11/07/15 1200  dolutegravir (TIVICAY) tablet 50 mg     50 mg Oral Daily 11/07/15 0118     11/07/15 1200  emtricitabine-tenofovir AF (DESCOVY) 200-25 MG per tablet 1 tablet     1 tablet Oral Daily 11/07/15 0118     11/07/15 1030  sulfamethoxazole-trimethoprim (BACTRIM DS,SEPTRA DS) 800-160 MG  per tablet 2 tablet  Status:  Discontinued     2 tablet Oral 3 times daily 11/07/15 0952 11/08/15 1203   11/07/15 0115  sulfamethoxazole-trimethoprim (BACTRIM DS,SEPTRA DS) 800-160 MG per tablet 1-23 tablet  Status:  Discontinued     1-23 tablet Oral See admin instructions 11/07/15 0118 11/07/15 0948      Medications: Scheduled Meds: . antiseptic oral rinse  7 mL Mouth Rinse BID  . clarithromycin  500 mg Oral Q12H  . dolutegravir  50 mg Oral Q1200  . dronabinol  2.5 mg Oral BID AC  . emtricitabine-tenofovir AF  1 tablet Oral Q1200  . ethambutol  900 mg Oral Daily  . feeding supplement (ENSURE ENLIVE)  237 mL Oral QID  . ferrous sulfate  325 mg Oral Q breakfast  . fluconazole (DIFLUCAN) IV  400 mg Intravenous Q24H  . multivitamin with minerals  1 tablet Oral Daily  . sodium chloride flush  3 mL Intravenous Q12H  . vitamin B-12  100 mcg Oral Daily  . Vitamin D (Ergocalciferol)  50,000 Units Oral Q Fri   Continuous Infusions: . dextrose 5 % and 0.9% NaCl 75 mL/hr  at 11/22/15 2152   PRN Meds:.acetaminophen **OR** acetaminophen, albuterol, dextromethorphan-guaiFENesin, metoprolol, ondansetron, prochlorperazine    Objective: Weight change:   Intake/Output Summary (Last 24 hours) at 11/23/15 1602 Last data filed at 11/23/15 1501  Gross per 24 hour  Intake             1490 ml  Output                0 ml  Net             1490 ml   Blood pressure 139/82, pulse 84, temperature 98.7 F (37.1 C), temperature source Oral, resp. rate (!) 25, height  (1.626 m), weight 133 lb 11.2 oz (60.6 kg), SpO2 100 %. Temp:  [98.4 F (36.9 C)-99.3 F (37.4 C)] 98.7 F (37.1 C) (08/02 1507) Pulse Rate:  [81-101] 84 (08/02 1525) Resp:  [17-26] 25 (08/02 1525) BP: (116-149)/(69-97) 139/82 (08/02 1525) SpO2:  [90 %-100 %] 100 % (08/02 1525) Weight:  [133 lb 11.2 oz (60.6 kg)] 133 lb 11.2 oz (60.6 kg) (08/02 1407)  Physical Exam: General: weak but awake and oriented to person and hospital,    HEENT: anicteric sclera, pupils reactive to light and accommodation, EOMI CVS tachycardic rate, normal r,  no murmur rubs or gallops Chest: clear to auscultation bilaterally, no wheezing, rales or rhonchi Abdomen: soft not especially tender, nondistended, normal bowel sounds, Extremities: no  clubbing or edema noted bilaterally Skin: no rashes Lymph: no new lymphadenopathy Neuro: nonfocal  CBC: CBC Latest Ref Rng & Units 11/18/2015 11/17/2015 11/16/2015  WBC 4.0 - 10.5 K/uL 1.6(L) 1.0(LL) 0.9(LL)  Hemoglobin 12.0 - 15.0 g/dL 7.6(L) 8.2(L) 8.0(L)  Hematocrit 36.0 - 46.0 % 22.7(L) 24.2(L) 24.2(L)  Platelets 150 - 400 K/uL 115(L) 90(L) 88(L)      BMET  Recent Labs  11/21/15 0800 11/23/15 0928  NA 132* 135  K 3.6 3.0*  CL 108 109  CO2 22 22  GLUCOSE 119* 210*  BUN 11 8  CREATININE 0.67 0.71  CALCIUM 7.1* 6.9*     Liver Panel  No results for input(s): PROT, ALBUMIN, AST, ALT, ALKPHOS, BILITOT, BILIDIR, IBILI in the last 72 hours.     Sedimentation Rate No results for input(s): ESRSEDRATE in the last 72 hours. C-Reactive Protein No results for input(s): CRP in the last 72 hours.  Micro Results: Recent Results (from the past 720 hour(s))  Pneumocystis smear by DFA     Status: None   Collection Time: 10/28/15  9:09 PM  Result Value Ref Range Status   Specimen Source-PJSRC SPUTUM  Final   Pneumocystis jiroveci Ag NEGATIVE  Final    Comment: Performed at [facility]  Culture, blood (Routine x 2)     Status: None   Collection Time: 11/06/15  8:30 PM  Result Value Ref Range Status   Specimen Description BLOOD LEFT ARM  Final   Special Requests IN PEDIATRIC BOTTLE 2CC  Final   Culture NO GROWTH 5 DAYS  Final   Report Status 11/11/2015 FINAL  Final  Culture, blood (Routine x 2)     Status: None   Collection Time: 11/06/15  8:45 PM  Result Value Ref Range Status   Specimen Description BLOOD LEFT ARM  Final   Special Requests BOTTLES DRAWN AEROBIC AND  ANAEROBIC 4CC   Final   Culture NO GROWTH 5 DAYS  Final   Report Status 11/11/2015 FINAL  Final  Urine culture     Status:  None   Collection Time: 11/06/15 10:36 PM  Result Value Ref Range Status   Specimen Description URINE, CATHETERIZED  Final   Special Requests NONE  Final   Culture NO GROWTH  Final   Report Status 11/08/2015 FINAL  Final  MRSA PCR Screening     Status: None   Collection Time: 11/09/15 11:54 AM  Result Value Ref Range Status   MRSA by PCR NEGATIVE NEGATIVE Final    Comment:        The GeneXpert MRSA Assay (FDA approved for NASAL specimens only), is one component of a comprehensive MRSA colonization surveillance program. It is not intended to diagnose MRSA infection nor to guide or monitor treatment for MRSA infections.   Culture, blood (routine x 2)     Status: None   Collection Time: 11/11/15  4:50 PM  Result Value Ref Range Status   Specimen Description BLOOD LEFT ANTECUBITAL  Final   Special Requests BOTTLES DRAWN AEROBIC ONLY 5CC  Final   Culture NO GROWTH 5 DAYS  Final   Report Status 11/16/2015 FINAL  Final  Culture, blood (routine x 2)     Status: None   Collection Time: 11/11/15  4:55 PM  Result Value Ref Range Status   Specimen Description BLOOD BLOOD LEFT ARM  Final   Special Requests IN PEDIATRIC BOTTLE 1CC  Final   Culture NO GROWTH 5 DAYS  Final   Report Status 11/16/2015 FINAL  Final  Acid Fast Smear (AFB)     Status: None   Collection Time: 11/11/15  8:11 PM  Result Value Ref Range Status   AFB Specimen Processing Concentration  Final    Comment: (NOTE) Performed At: [facility] [address] [place], [place] [id number] Mila Homer MD WU:[id number]    Acid Fast Smear NOSMR  Final    Comment: (NOTE) The acid-fast bacilli (AFB) smear will not be performed due to the specimen source (blood) or submission of an insufficient quantity of specimen.    Source (AFB) BLOOD  Corrected  Culture, blood (routine x 2)      Status: None   Collection Time: 11/17/15  1:10 AM  Result Value Ref Range Status   Specimen Description BLOOD RIGHT ANTECUBITAL  Final   Special Requests BOTTLES DRAWN AEROBIC AND ANAEROBIC 10CC,5CC  Final   Culture NO GROWTH 5 DAYS  Final   Report Status 11/22/2015 FINAL  Final  Culture, blood (routine x 2)     Status: None   Collection Time: 11/17/15  1:23 AM  Result Value Ref Range Status   Specimen Description BLOOD RIGHT HAND  Final   Special Requests IN PEDIATRIC BOTTLE 3CC  Final   Culture NO GROWTH 5 DAYS  Final   Report Status 11/22/2015 FINAL  Final    Studies/Results: No results found.    Assessment/Plan:  INTERVAL HISTORY:   Dysphagia persists  11/23/15: EGD performed by Dr. Ewing Schlein (greatly appreciate this) and specimen sent to path but I cannot locate any PCR's for CMV or HSV EGD did show ulcers    Principal Problem:   Hyponatremia Active Problems:   Human immunodeficiency virus (HIV) disease (HCC)   CAP (community acquired pneumonia)   Protein-calorie malnutrition, severe (HCC)   Acute encephalopathy   Hyperkalemia   Nausea & vomiting   Pressure ulcer   Elevated lactic acid level   Arterial hypotension   Tachypnea   Tachycardia   Anemia of chronic disease   FUO (fever of unknown origin)  Candida infection, esophageal (HCC)   AIDS (HCC)   Physical deconditioning   Malnutrition of moderate degree   Anemia due to bone marrow failure (HCC)   Cytomegalovirus (CMV) viremia (HCC)   Acute esophagitis   Failure to thrive in adult    Samantha Kline is a 55 y.o. female with  Newly dx HIV/AIDS rx for PCP admitted with FTT, esophagitis, fever, started on empiric rx for M avium, fluconazole for possible esophageal candidiasis.  #1 Dysphagia:  Greatly appreciate Dr. Ewing Schlein seeing the patient again and patient for EGD that shows ulcers though I do not see PCR's pending  Based on the presence of ulcers and the high CMV VL  I will start Gancyclovir but she  will also need G-CSF support  Hopefully the PCR's can be located so that we can have something besides path to go off of or the path will help distinguish between HSV and CMV  #2 Fevers: IN context of her pancytopenia I think the decision to start M avium therapy makes perfect sense and fevers are gone  #3 HIV/AIDS: she has started Tivicay and Descovy a regimen that should afford her rapid virological suppression while it will give higher risk for IRIS and indeed her VL has dropped from 200K to 200 in only 3 weeks!  Lab Results  Component Value Date   HIV1RNAQUANT 200 11/16/2015   Lab Results  Component Value Date   CD4TABS 20 (L) 11/16/2015    #4 CMV viremia: She has CMV quant VL that + tonight at 756K  See above I will start GCV and will ask for pharmacy help with G CSF  I do not think she has meningitis clearly though there has been confusion at times. No visual problems  #5 Failure to thrive: multifactorial. Hopefully she can ultimately participate in rehab I am very leery of her going to SNF given some recent experiences I have had with very frail patients with HIV/AIDS  We should continue to pursue aggressive support and care  Dr. Orvan Falconer will be covering for me tomorrow and I will be back on Friday.     LOS: 16 days   Samantha Kline 11/23/2015, 4:02 PM

## 2015-11-23 NOTE — Transfer of Care (Signed)
Immediate Anesthesia Transfer of Care Note  Patient: Samantha Kline  Procedure(s) Performed: Procedure(s): ESOPHAGOGASTRODUODENOSCOPY (EGD) (N/A)  Patient Location: Endoscopy Unit  Anesthesia Type:MAC  Level of Consciousness: responds to stimulation  Airway & Oxygen Therapy: Patient Spontanous Breathing and Patient connected to nasal cannula oxygen  Post-op Assessment: Report given to RN and Post -op Vital signs reviewed and stable  Post vital signs: Reviewed and stable  Last Vitals:  Vitals:   11/23/15 1334 11/23/15 1407  BP: (!) 121/95 (!) 149/97  Pulse: 96 84  Resp:  (!) 24  Temp: 37.4 C 37.2 C    Last Pain:  Vitals:   11/23/15 1407  TempSrc: Oral  PainSc:       Patients Stated Pain Goal: 2 (11/14/15 0857)  Complications: No apparent anesthesia complications

## 2015-11-23 NOTE — Op Note (Signed)
[facility] Patient Name: Samantha Kline Procedure Date : 11/23/2015 MRN: [id number] Attending MD: Vida Rigger , MD Date of Birth: 1960-11-04 CSN: [id number] Age: 55 Admit Type: Inpatient Procedure:                Upper GI endoscopy Indications:              Dysphagia, Odynophagia, Weight loss Providers:                Vida Rigger, MD, Will Bonnet RN, RN, Clearnce Sorrel, Technician, Arlice Colt, CRNA Referring MD:              Medicines:                Propofol total dose 85 mg IV Complications:            No immediate complications. Estimated Blood Loss:     Estimated blood loss: none. Procedure:                Pre-Anesthesia Assessment:                           - Prior to the procedure, a History and Physical                            was performed, and patient medications and                            allergies were reviewed. The patient's tolerance of                            previous anesthesia was also reviewed. The risks                            and benefits of the procedure and the sedation                            options and risks were discussed with the patient.                            All questions were answered, and informed consent                            was obtained. Prior Anticoagulants: The patient has                            taken no previous anticoagulant or antiplatelet                            agents. ASA Grade Assessment: III - A patient with                            severe systemic disease. After reviewing the risks  and benefits, the patient was deemed in                            satisfactory condition to undergo the procedure.                           After obtaining informed consent, the endoscope was                            passed under direct vision. Throughout the                            procedure, the patient's blood pressure, pulse, and          oxygen saturations were monitored continuously. The                            EG-[id number] ([id number]) scope was introduced through the                            mouth, and advanced to the second part of duodenum.                            The upper GI endoscopy was accomplished without                            difficulty. The patient tolerated the procedure                            well. Scope In: Scope Out: Findings:      Multiple cratered, linear and superficial esophageal ulcers with no       bleeding were found. in the mid and distal esophagus and Biopsies were       taken with a cold forceps for histology and viral studies.      Localized mild inflammation characterized by erythema was found in the       gastric antrum and distal greater curve.      The duodenal bulb, first portion of the duodenum and second portion of       the duodenum were normal.      The exam was otherwise without abnormality. Impression:               - Non-bleeding esophageal ulcers. Biopsied.                           - Chronic gastritis.                           - Normal duodenal bulb, first portion of the                            duodenum and second portion of the duodenum.                           - The examination was otherwise normal. Moderate Sedation:      moderate sedation-none Recommendation:           -  Patient has a contact number available for                            emergencies. The signs and symptoms of potential                            delayed complications were discussed with the                            patient. Return to normal activities tomorrow.                            Written discharge instructions were provided to the                            patient.                           - Soft diet today. if patient does not respond to                            therapy soon and consider feeding tube either with                            a short-term NG or PEG  placement per IR- Continue                            present medications.                           - Await pathology and viral results.                           - Return to GI clinic PRN.                           - Telephone GI clinic if symptomatic PRN. Procedure Code(s):        --- Professional ---                           [phone], Esophagogastroduodenoscopy, flexible,                            transoral; with biopsy, single or multiple Diagnosis Code(s):        --- Professional ---                           K22.10, Ulcer of esophagus without bleeding                           K29.50, Unspecified chronic gastritis without                            bleeding  R13.10, Dysphagia, unspecified                           R63.4, Abnormal weight loss CPT copyright 2016 [employer]. All rights reserved. The codes documented in this report are preliminary and upon coder review may  be revised to meet current compliance requirements. Vida Rigger, MD 11/23/2015 3:14:25 PM This report has been signed electronically. Number of Addenda: 0

## 2015-11-23 NOTE — Progress Notes (Signed)
Noted EGD today. We continue to follow for potential for inpt rehab pending insurance approval when pt medically ready and tolerance for more intense therapies. I will be off until 8/14. Roderic Palau will follow up in my absence. She can be reached at [phone]

## 2015-11-23 NOTE — Progress Notes (Signed)
Samantha Kline 2:32 PM  Subjective: Patient without any new complaints ready for her procedure which was rediscussed  Objective: Vital signs stable afebrile no acute distress exam please see preassessment evaluation chemistries stable  Assessment: Failure to thrive dysphasia  Plan: Okay to proceed with endoscopy with anesthesia assistance this afternoon  [facility]  Pager [phone] After 5PM or if no answer call [phone]

## 2015-11-23 NOTE — Progress Notes (Signed)
Pharmacy Antibiotic Note  Samantha Kline is a 55 y.o. female newly dx HIV/AIDS rx for PCP admitted on 11/06/2015 with FTT, esophagitis, fever, started on empiric rx for M avium, fluconazole for possible esophageal candidiasis.  Tonight, pharmacy has been consulted for Gancyclovir dosing FOR CMV ESOPHAGITIS. FIRST DOSE IN THE AM per Dr. Zenaida Niece Dam's order. Recommended dosing is 5mg /kg IV q12h for 21-28 days or until signs and symptoms have resolved.  Renal dose adjustment recommended for CrCl <70 ml/min. For CrCl 50-69 ml/min the dose should be reduced to 2.5 mg/kg IV q12hr  Currently the SCr is 0.71 and CrCl is 67.8 ml/min.      Plan: Start Gancyclovir 2.5 mg/kg = 150 mg IV q12h   Height: 5\' 4"  (162.6 cm) Weight: 133 lb 11.2 oz (60.6 kg) IBW/kg (Calculated) : 54.7  Temp (24hrs), Avg:98.9 F (37.2 C), Min:98.4 F (36.9 C), Max:99.3 F (37.4 C)   Recent Labs Lab 11/17/15 0110 11/18/15 0522 11/19/15 0444 11/20/15 1206 11/21/15 0800 11/23/15 0928  WBC 1.0* 1.6*  --   --   --   --   CREATININE 0.64  --  0.81 0.90 0.67 0.71  LATICACIDVEN 1.4  --   --   --   --   --     Estimated Creatinine Clearance: 67.8 mL/min (by C-G formula based on SCr of 0.8 mg/dL).    No Known Allergies  Antimicrobials this admission: -Fluconzaole 400 mg for candidal thrush- continue IV for now per GI/ID. 21 days planned. 7/21>> (8/10) -Clarithromycin/Ethambutol empirically for MAI: 7/27>> -Gancyclovir 8/2 >>   Dose adjustments this admission: n/a  Microbiology results: 7/16 BC, UC neg - 7/19 MRSA PCR negative - 7/21 BCx: neg - 7/21 Acid fast smear : In process  7/25 Chest CT: Improved aeration of the lungs with residual ill-defined tree-in-bud opacities with bilateral lung bases- No new or worsening focal airspace opacities.  -7/27 BCx x2: neg -7/29 CMV Ab + -  Thank you for allowing pharmacy to be a part of this patient's care.  Arman Filter 11/23/2015 5:17 PM

## 2015-11-23 NOTE — Progress Notes (Signed)
Patient ID: Samantha Kline, female   DOB: 11-30-1960, 55 y.o.   MRN: [id number]  PROGRESS NOTE    Samantha Kline  ION:[id number] DOB: [birth year] DOA: 11/06/2015  PCP: No PCP Per Patient   Brief Narrative:  55 y.o. female with past medical history significant for recently diagnosed HIV, recent hospitalization from 10/24/2015 through 10/31/2015 for new diagnosis of HIV pneumonia. Patient was subsequently discharged on Bactrim and prednisone with concerns for pneumocystis pneumonia. Apparently patient was doing well for couple of days after discharge but then started to decline with generalized weakness, loss of appetite, nausea, vomiting and confusion. Patient's family reported no blood with vomiting. No diarrhea. No fevers or chills.  In ED, patient was hemodynamically stable. Blood work was notable for sodium of 110, potassium 5.3, bicarbonate 18, glucose 161. Lactic acid was 2.65 but subsequently normalized. CT head showed no acute intracranial process. Chest x-ray showed no acute abnormalities.  Barrier to discharge: Ongoing fever, leukopenia and difficulty swallowing. ID following. Unable to estimate stability for discharge currently.   Assessment & Plan: Acute metabolic encephalopathy / hypoosmolar hyponatremia -Likely secondary to severe hyponatremia. Sodium was as low as 110 on the admission -Hyponatremia presumed to be secondary to lungs infection and also likely because of Bactrim -No acute findings on CT head -Sodium 128-132 now and stable -will monitor intermittently   Fever of unknown origin - Ongoing fever despite multiple abx. 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 Biaxin and ethambutol. Appreciate their assistance.  Oral thrush /  Dysphagia: and presumed esophageal candidiasis  - Continue fluconazole, dose increased per ID to 400 mg instead of 100 mg daily. She was started on fluconazole 400 mg daily and per ID to continue for 21 days  - dysphagia 2 diet - GI re-consulted, given lack of improvement and continue with difficulty swallowing. 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   - completed treatment for PCP with prednisone, clindamycin and primaquine. - Continue Dapsone for prophylaxis - following ID rec's and giving ongoing fever and concerns for MAI, will be treated with biaxin and ethambutol  -CMV antibody was positive; will follow load (which is pending) to determine needs for treatment. Awaiting endoscopy.  Anemia of chronic disease -Hemoglobin ranges 7.6 - 8.5 range -no signs of acute bleeding -will follow trend and transfuse for Hgb < 7 -repeat labs in am./   Leukopenia / ?neutropenia  - Likely from HIV  - no growth on further cx's - patient has completed active antibiotic treatment and currently is receiving treatment for HIV - will follow cell line trend  -no signs of acute bleeding   Hyperkalemia / Hypokalemia  - Secondary to Bactrim - She was given Kayexalate at some point; now with low K intermittently - will follow electrolytes and replete as needed -Mg WNL  Severe protein calorie malnutrition -In the context of chronic illness -Continue ensure supplementation and encouraged to increase PO intake -advancing diet as tolerated -started on megace by ID rec's  Stage 1 decubitus pressure ulcer -continue constant repositioning and preventive measures/barrier creams  DVT prophylaxis: Stop Lovenox and use SCD's bilaterally due to drop in platelets over past 2 days 102 -->  79 Code Status: full code  Family Communication: no family at bedside Disposition Plan:  Plan is for CIR vs SNF   Consultants:     ID  SLP  GI, [facility], consulted 7/25  Procedures:   None   Antimicrobials:   HAART  Dapsone 11/08/2015 -->  Clindamycin 11/08/2015 -->completed course   Primaquine 11/08/2015 -->completed course   Fluconazole 11/11/2015 --> planning to treat for 21 days total   Azithromycin >>>stopped on 7/27 after treatment for MAI started   Biaxin 7/27>>>  Ethambutol 7/27>>>   Subjective: She is alert, answer questions. Slowly answering questions.  oriented to place. Denies headaches.   Objective: Vitals:   11/22/15 2129 11/23/15 0546 11/23/15 1334 11/23/15 1407  BP: 126/81 126/76 (!) 121/95 (!) 149/97  Pulse: 81 92 96 84  Resp: 17 17  (!) 24  Temp: 98.4 F (36.9 C) 99 F (37.2 C) 99.3 F (37.4 C) 98.9 F (37.2 C)  TempSrc: Oral Oral Oral Oral  SpO2: 98% 100% 90% 99%  Weight:    60.6 kg (133 lb 11.2 oz)  Height:    5\' 4"  (1.626 m)    Intake/Output Summary (Last 24 hours) at 11/23/15 1414 Last data filed at 11/23/15 1311  Gross per 24 hour  Intake             1390 ml  Output                0 ml  Net             1390 ml   Filed Weights   11/07/15 0900 11/16/15 0510 11/23/15 1407  Weight: 52.8 kg (116 lb 4.8 oz) 60.6 kg (133 lb 11.2 oz) 60.6 kg (133 lb 11.2 oz)    Examination:  General exam: dry MM, no oral thrush appreciated on oral exam, reports no vomiting or diarrhea. Has continue intermittent episodes of low grade temp (much improved overall). Frail, underweight and chronically ill in appearance.   Respiratory system: No wheezing, diminished breath sounds  Cardiovascular system: S1 & S2 (+), tachycardic  Gastrointestinal system: (+) BS, no tenderness, no distention  Central nervous system: Nonfocal  Extremities: No swelling, palpable pulses  Skin: warm, dry, stage 1 pressure ulcer in her decubitus area    Data Reviewed: I have personally reviewed following labs and imaging studies  CBC:  Recent Labs Lab 11/17/15 0110 11/18/15 0522  WBC 1.0* 1.6*   NEUTROABS 0.5* 0.9*  HGB 8.2* 7.6*  HCT 24.2* 22.7*  MCV 91.0 91.5  PLT 90* 115*   Basic Metabolic Panel:  Recent Labs Lab 11/17/15 0110 11/19/15 0444 11/20/15 1206 11/21/15 0800 11/23/15 0928  NA 125* 129* 131* 132* 135  K 3.1* 2.8* 2.6* 3.6 3.0*  CL 95* 101 104 108 109  CO2 24 22 22 22 22   GLUCOSE 132* 99 125* 119* 210*  BUN 6 11 13 11 8   CREATININE 0.64 0.81 0.90 0.67 0.71  CALCIUM 7.3* 7.1* 7.1* 7.1* 6.9*  MG  --   --   --  2.0  --    GFR: Estimated Creatinine Clearance: 67.8 mL/min (by C-G formula based on SCr of 0.8 mg/dL).  Urine analysis:    Component Value Date/Time   COLORURINE YELLOW 11/06/2015 2236   APPEARANCEUR CLEAR 11/06/2015 2236   LABSPEC 1.029 11/06/2015 2236   PHURINE 6.0 11/06/2015 2236   GLUCOSEU NEGATIVE 11/06/2015 2236   HGBUR NEGATIVE 11/06/2015 2236   BILIRUBINUR NEGATIVE 11/06/2015 2236   KETONESUR NEGATIVE 11/06/2015 2236  PROTEINUR 30 (A) 11/06/2015 2236   NITRITE NEGATIVE 11/06/2015 2236   LEUKOCYTESUR NEGATIVE 11/06/2015 2236    Recent Results (from the past 240 hour(s))  Culture, blood (Routine x 2)     Status: None (Preliminary result)   Collection Time: 11/06/15  8:30 PM  Result Value Ref Range Status   Specimen Description BLOOD LEFT ARM  Final   Special Requests IN PEDIATRIC BOTTLE 2CC  Final   Culture NO GROWTH 3 DAYS  Final   Report Status PENDING  Incomplete  Culture, blood (Routine x 2)     Status: None (Preliminary result)   Collection Time: 11/06/15  8:45 PM  Result Value Ref Range Status   Specimen Description BLOOD LEFT ARM  Final   Special Requests BOTTLES DRAWN AEROBIC AND ANAEROBIC 4CC   Final   Culture NO GROWTH 3 DAYS  Final   Report Status PENDING  Incomplete  Urine culture     Status: None   Collection Time: 11/06/15 10:36 PM  Result Value Ref Range Status   Specimen Description URINE, CATHETERIZED  Final   Special Requests NONE  Final   Culture NO GROWTH  Final   Report Status 11/08/2015 FINAL   Final  MRSA PCR Screening     Status: None   Collection Time: 11/09/15 11:54 AM  Result Value Ref Range Status   MRSA by PCR NEGATIVE NEGATIVE Final     Radiology Studies: Dg Chest Port 1 View 11/11/2015 Low lung volumes. Subtle opacity at the left lung base, suspicious for pneumonia. Aortic atherosclerosis.   Dg Chest 1 View 11/06/2015  No acute abnormalities.   Ct Head Wo Contrast 11/07/2015  No acute intracranial process.    Mitzi Hansen Hold] antiseptic oral rinse  7 mL Mouth Rinse BID  . [MAR Hold] clarithromycin  500 mg Oral Q12H  . [MAR Hold] dolutegravir  50 mg Oral Q1200  . [MAR Hold] dronabinol  2.5 mg Oral BID AC  . [MAR Hold] emtricitabine-tenofovir AF  1 tablet Oral Q1200  . [MAR Hold] ethambutol  900 mg Oral Daily  . [MAR Hold] feeding supplement (ENSURE ENLIVE)  237 mL Oral QID  . [MAR Hold] ferrous sulfate  325 mg Oral Q breakfast  . [MAR Hold] fluconazole (DIFLUCAN) IV  400 mg Intravenous Q24H  . [MAR Hold] multivitamin with minerals  1 tablet Oral Daily  . potassium chloride  10 mEq Intravenous Q1 Hr x 3  . [MAR Hold] sodium chloride flush  3 mL Intravenous Q12H  . [MAR Hold] vitamin B-12  100 mcg Oral Daily  . [MAR Hold] Vitamin D (Ergocalciferol)  50,000 Units Oral Q Fri     Continuous Infusions: . dextrose 5 % and 0.9% NaCl 75 mL/hr at 11/22/15 2152     LOS: 16 days    Time spent: 25 minutes    Alba Cory, MD [facility] Pager [phone]  If 7PM-7AM, please contact night-coverage [email or web address] Password [email or web address] 11/23/2015, 2:14 PM

## 2015-11-24 DIAGNOSIS — K2210 Ulcer of esophagus without bleeding: Secondary | ICD-10-CM

## 2015-11-24 LAB — CBC
HEMATOCRIT: 28.8 % — AB (ref 36.0–46.0)
Hemoglobin: 9.3 g/dL — ABNORMAL LOW (ref 12.0–15.0)
MCH: 30.2 pg (ref 26.0–34.0)
MCHC: 32.3 g/dL (ref 30.0–36.0)
MCV: 93.5 fL (ref 78.0–100.0)
PLATELETS: 195 10*3/uL (ref 150–400)
RBC: 3.08 MIL/uL — ABNORMAL LOW (ref 3.87–5.11)
RDW: 19 % — AB (ref 11.5–15.5)
WBC: 10.3 10*3/uL (ref 4.0–10.5)

## 2015-11-24 LAB — MAGNESIUM: MAGNESIUM: 1.7 mg/dL (ref 1.7–2.4)

## 2015-11-24 LAB — BASIC METABOLIC PANEL
Anion gap: 6 (ref 5–15)
BUN: 7 mg/dL (ref 6–20)
CALCIUM: 7.3 mg/dL — AB (ref 8.9–10.3)
CHLORIDE: 103 mmol/L (ref 101–111)
CO2: 23 mmol/L (ref 22–32)
CREATININE: 0.61 mg/dL (ref 0.44–1.00)
GFR calc Af Amer: >60 mL/min (ref >60)
GLUCOSE: 99 mg/dL (ref 65–99)
Potassium: 3.7 mmol/L (ref 3.5–5.1)
Sodium: 132 mmol/L — ABNORMAL LOW (ref 135–145)

## 2015-11-24 MED ORDER — AMITRIPTYLINE HCL 10 MG PO TABS
10.0000 mg | ORAL_TABLET | Freq: Every day | ORAL | Status: DC
  Administered 2015-11-24: 10 mg via ORAL
  Filled 2015-11-24 (×2): qty 1

## 2015-11-24 NOTE — Progress Notes (Signed)
Occupational Therapy Treatment Patient Details Name: Samantha Kline MRN: [id number] DOB: 1960-08-27 Today's Date: 11/24/2015    History of present illness 55 y.o. female with medical history significant of recently diagnosed HIV, recently treated pneumonia, who presents with altered mental status, generalized weakness, nausea and vomiting.Head CT and CXR negative. 50 lb unintentional weight loss over the past 5 - 6 months along with decreased appetite.    OT comments  Pt making gradual progress toward OT goals this session. Pt able to perform grooming tasks sitting in chair with min assist. Pt fatigues quickly during functional activities; requiring rest breaks and increased time. Pt is very motivated to return to functional independence; requesting to attempt tasks on own prior to therapist providing assist. D/c plan remains appropriate. Will continue to follow acutely.   Follow Up Recommendations  CIR;Supervision/Assistance - 24 hour    Equipment Recommendations  Wheelchair (measurements OT);Wheelchair cushion (measurements OT);Hospital bed;Other (comment) (TBD)    Recommendations for Other Services      Precautions / Restrictions Precautions Precautions: Fall Restrictions Weight Bearing Restrictions: No       Mobility Bed Mobility     General bed mobility comments: Pt OOB in chair upon arrival.  Transfers       General transfer comment: Not assessed at this time.    Balance                         ADL Overall ADL's : Needs assistance/impaired     Grooming: Minimal assistance;Oral care;Wash/dry face;Sitting Grooming Details (indicate cue type and reason): Assist for cap back on toothpaste and set up for all supplies. Pt quick to fatigue.                               General ADL Comments: Pt fatigues very quickly with functional activities. Pt noted to have flexed posture of wrists and digits; pt able to actively come out of posture with verbal  cues. Encouraged proper positioning of bil UEs.       Vision                     Perception     Praxis      Cognition   Behavior During Therapy: Flat affect Overall Cognitive Status: Impaired/Different from baseline Area of Impairment: Following commands;Problem solving        Following Commands: Follows one step commands with increased time;Follows one step commands consistently   Awareness: Anticipatory Problem Solving: Slow processing      Extremity/Trunk Assessment               Exercises Other Exercises Other Exercises: Provided pt with squeeze ball for strengthening and encouraging AROM bil hands.   Shoulder Instructions       General Comments      Pertinent Vitals/ Pain       Pain Assessment: Faces Faces Pain Scale: Hurts even more Pain Location: buttocks Pain Descriptors / Indicators: Sore;Aching Pain Intervention(s): Limited activity within patient's tolerance;Monitored during session  Home Living                                          Prior Functioning/Environment              Frequency Min 2X/week     Progress Toward  Goals  OT Goals(current goals can now be found in the care plan section)  Progress towards OT goals: Progressing toward goals (goals remain appropriate)  Acute Rehab OT Goals Patient Stated Goal: Get stronger/better OT Goal Formulation: With patient Time For Goal Achievement: 12/08/15 Potential to Achieve Goals: Good  Plan Discharge plan remains appropriate    Co-evaluation                 End of Session     Activity Tolerance Patient limited by fatigue   Patient Left in chair;with call bell/phone within reach;with chair alarm set   Nurse Communication          Time: 0086-7619 OT Time Calculation (min): 23 min  Charges: OT General Charges $OT Visit: 1 Procedure OT Treatments $Self Care/Home Management : 23-37 mins  Gaye Alken M.S., OTR/L Pager: [phone]   11/24/2015, 5:09 PM

## 2015-11-24 NOTE — Progress Notes (Signed)
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

## 2015-11-24 NOTE — Progress Notes (Signed)
Rehab admissions - I met briefly with patient this am.  I spoke with case manager and with attending MD.  I will seek authorization for acute inpatient rehab.  I hope to be able to admit to inpatient rehab tomorrow if patient is medically ready.  I will update all once I hear back from insurance case manager.  Call me for questions.  #[phone]

## 2015-11-24 NOTE — Progress Notes (Signed)
Speech Language Pathology Treatment:    Patient Details Name: Samantha Kline MRN: [id number] DOB: Nov 06, 1960 Today's Date: 11/24/2015 Time: 6803-2122 SLP Time Calculation (min) (ACUTE ONLY): 18 min  Assessment / Plan / Recommendation Clinical Impression  Pt seen to check for change in function and possibility to upgrade diet. Unfortunately endoscopy shows ulceration in esophagus. Pts mentation and behavior with PO remains the same. She will take 2-3 sips of liquids with slight oral holding initially, with increased time holding with each trial. Recommend pt continue liquid diet to facilitate intake with minimal fatigue or possible discomfort, though pt could attempt any POs desired. Discussed this with RN, that pt may have any foods of choice and is not restricted from a safety standpoint. Discussed plan of care with MD, RN and dietitian. Will follow for change in function given possible change in medication.    HPI HPI: Samantha Kline is a 55 y.o. female with medical history significant of recently diagnosed HIV, recently treated pneumonia, who presents with altered mental status, generalized weakness, nausea and vomiting.Head CT and CXR negative. 50 lb unintentional weight loss over the past 5 - 6 months along with decreased appetite.  Pt underwent MBS yesterday and tody is seen for tolerance, readiness for advancement.       SLP Plan  Continue with current plan of care     Recommendations  Diet recommendations: Thin liquid Liquids provided via: Cup;Straw Medication Administration: Whole meds with liquid Supervision: Full supervision/cueing for compensatory strategies Compensations: Slow rate;Small sips/bites;Follow solids with liquid Postural Changes and/or Swallow Maneuvers: Seated upright 90 degrees;Upright 30-60 min after meal             Plan: Continue with current plan of care     GO               [facility], MA CCC-SLP [phone]  Claudine Mouton 11/24/2015, 11:19  AM

## 2015-11-24 NOTE — Progress Notes (Addendum)
Nutrition Follow-up  DOCUMENTATION CODES:   Non-severe (moderate) malnutrition in context of chronic illness  INTERVENTION:   -Continue Ensure Enlive po QID, each supplement provides 350 kcal and 20 grams of protein -Continue MVI -If nutrition support is pursued, recommend:  Initiate Osmolite 1.2 @ 20 ml/hr and increase by 10 ml every 12 hours to goal rate of 60 ml/hr.   Tube feeding regimen provides 1728 kcal (100% of needs), 80 grams of protein, and 1181 ml of H2O.   -Also recommend checking K, Mg, and Phos daily x 3 days once feeding is started, due to pt being at high risk of developing refeeding syndrome  NUTRITION DIAGNOSIS:   Malnutrition related to chronic illness as evidenced by moderate depletion of body fat, moderate depletions of muscle mass.  Ongoing  GOAL:   Patient will meet greater than or equal to 90% of their needs  Unmet  MONITOR:   Diet advancement, PO intake, Supplement acceptance, Labs, Weight trends, I & O's  REASON FOR ASSESSMENT:   Consult Assessment of nutrition requirement/status  ASSESSMENT:   Patient was recently hospitalized from 7/3-7/10 and had new diagnosis of HIV and pneumonia. She was started with anti-HIV medications. Patient was discharged on bactrim and prednisone with concerns of PCP. Per husband, pt was doing well intially, but 4 days ago she started to decline. Pt developed increased generalized weakness, loss of appetite, decreased oral intake, nauseas, vomiting and confusion. She vomited twice without blood in the vomitus today. Patient does not have abdominal pain or diarrhea. She was initially very confused with AMS, which improved with IV NS bolus, and became oriented x 3. She continues to have dry cough and mild shortness of breath, but no chest pain. No fever or chills. Patient denies symptoms of UTI or unilateral weakness. When I saw pt in ED, she is oriented x 3 and looks very weak, moves all extremities. No active nausea,  vomiting, abdominal pain or chest pain.  Pt underwent upper endoscopy on 11/23/15. Per op note, findings included chronic gastritis and esophageal ulcers, which have been biopsied and awaiting pathology.   Diet was advanced. Intake remains minimal; PO averaging around 10% at meals. Per GI notes, recommending enteral nutrition support if pt's intake continues to be poor.  Case discussed extensively with MD (Dr. Sunnie Nielsen), SLP, and RN. Pt is not taking in adequate nutrition via PO route, due to cognition and weakness. SLP recommending full liquid diet for energy conservation, but is able to tolerate textures of solid foods. Pt is currently pocketing foods in mouth. Pt's intake of Ensure supplements have declined, per SLP, pt consumed less than 25%. Discussed potential for short term supplemental TF to improve intake, however, pt refused NGT per MD. Palliative care consultation being considered by MD to discuss goals of care.   Medications reviewed and include marinol, vitamin B-12, and ferrous sulfate.   Labs reviewed: Na: 132 (on IV supplementation).   Diet Order:  DIET SOFT Room service appropriate? Yes; Fluid consistency: Thin  Skin:  Wound (see comment) (stage II sacrum, HIV lesions)  Last BM:  11/22/15  Height:   Ht Readings from Last 1 Encounters:  11/23/15 5\' 4"  (1.626 m)    Weight:   Wt Readings from Last 1 Encounters:  11/23/15 133 lb 11.2 oz (60.6 kg)    Ideal Body Weight:  54.5 kg  BMI:  Body mass index is 22.95 kg/m.  Estimated Nutritional Needs:   Kcal:  1600-1800  Protein:  80-95 grams  Fluid:  1.6-1.8 L  EDUCATION NEEDS:   No education needs identified at this time  [facility] Ricke A. Mayford Knife, RD, LDN, CDE Pager: [phone] After hours Pager: [phone]

## 2015-11-24 NOTE — Progress Notes (Signed)
Patient ID: Samantha Kline, female   DOB: [birth year], 55 y.o.   MRN: [id number]          [facility]    Date of Admission:  11/06/2015           Day 1 ganciclovir  Principal Problem:   Esophageal ulcer Active Problems:   Human immunodeficiency virus (HIV) disease (HCC)   Protein-calorie malnutrition, severe (HCC)   Acute encephalopathy   Hyponatremia   Hyperkalemia   Nausea & vomiting   Pressure ulcer   Elevated lactic acid level   Arterial hypotension   Tachypnea   Tachycardia   Anemia of chronic disease   Candida infection, esophageal (HCC)   AIDS (HCC)   Physical deconditioning   Anemia due to bone marrow failure (HCC)   Cytomegalovirus (CMV) viremia (HCC)   Acute esophagitis   Failure to thrive in adult   . amitriptyline  10 mg Oral QHS  . antiseptic oral rinse  7 mL Mouth Rinse BID  . clarithromycin  500 mg Oral Q12H  . dolutegravir  50 mg Oral Q1200  . dronabinol  2.5 mg Oral BID AC  . emtricitabine-tenofovir AF  1 tablet Oral Q1200  . ethambutol  900 mg Oral Daily  . feeding supplement (ENSURE ENLIVE)  237 mL Oral QID  . ferrous sulfate  325 mg Oral Q breakfast  . fluconazole (DIFLUCAN) IV  400 mg Intravenous Q24H  . ganciclovir (CYTOVENE) IV  2.5 mg/kg Intravenous Q12H  . multivitamin with minerals  1 tablet Oral Daily  . sodium chloride flush  3 mL Intravenous Q12H  . Tbo-filgastrim (GRANIX) SQ  300 mcg Subcutaneous Q24H  . vitamin B-12  100 mcg Oral Daily  . Vitamin D (Ergocalciferol)  50,000 Units Oral Q Fri    SUBJECTIVE: She states that she is feeling much better today. She denies any pain on swallowing and states that she is thinking more clearly.  Review of Systems: Review of Systems  Constitutional: Positive for malaise/fatigue and weight loss. Negative for chills, diaphoresis and fever.  HENT: Negative for sore throat.        No dysphagia or odynophagia.  Respiratory: Negative for cough and shortness of breath.     Cardiovascular: Negative for chest pain.  Gastrointestinal: Negative for abdominal pain, diarrhea, heartburn, nausea and vomiting.  Skin: Negative for rash.  Neurological: Negative for headaches.    Past Medical History:  Diagnosis Date  . Anemia   . HIV (human immunodeficiency virus infection) (HCC)    11/06/15 Family states it was just diagnosed this week  . Pneumonia 10/24/2015  . Vitamin D deficiency     Social History  Substance Use Topics  . Smoking status: Never Smoker  . Smokeless tobacco: Never Used  . Alcohol use Yes     Comment: 10/24/2015 "nothing since the 1980s"    Family History  Problem Relation Age of Onset  . Diabetes Father   . Heart attack Brother   . Hypertension Sister    No Known Allergies  OBJECTIVE: Vitals:   11/23/15 1525 11/23/15 2142 11/24/15 0637 11/24/15 1445  BP: 139/82 129/79 118/74 107/72  Pulse: 84 86 (!) 106 (!) 118  Resp: (!) 25 (!) 28  17  Temp:  100.3 F (37.9 C) 99.2 F (37.3 C) 98 F (36.7 C)  TempSrc:  Oral Oral Oral  SpO2: 100% 100% 98% 100%  Weight:      Height:       Body mass  index is 22.95 kg/m.  Physical Exam  Constitutional: She is oriented to person, place, and time.  She is thin and frail but alert and in no distress. She is sitting up in a chair.  Cardiovascular: Normal rate and regular rhythm.   No murmur heard. Pulmonary/Chest: Effort normal and breath sounds normal.  Abdominal: Soft. There is no tenderness.  Neurological: She is alert and oriented to person, place, and time.  Skin: No rash noted.  Psychiatric: Mood and affect normal.    Lab Results Lab Results  Component Value Date   WBC 10.3 11/24/2015   HGB 9.3 (L) 11/24/2015   HCT 28.8 (L) 11/24/2015   MCV 93.5 11/24/2015   PLT 195 11/24/2015    Lab Results  Component Value Date   CREATININE 0.61 11/24/2015   BUN 7 11/24/2015   NA 132 (L) 11/24/2015   K 3.7 11/24/2015   CL 103 11/24/2015   CO2 23 11/24/2015    Lab Results  Component  Value Date   ALT 14 11/19/2015   AST 30 11/19/2015   ALKPHOS 50 11/19/2015   BILITOT 0.6 11/19/2015     Microbiology: Recent Results (from the past 240 hour(s))  Culture, blood (routine x 2)     Status: None   Collection Time: 11/17/15  1:10 AM  Result Value Ref Range Status   Specimen Description BLOOD RIGHT ANTECUBITAL  Final   Special Requests BOTTLES DRAWN AEROBIC AND ANAEROBIC 10CC,5CC  Final   Culture NO GROWTH 5 DAYS  Final   Report Status 11/22/2015 FINAL  Final  Culture, blood (routine x 2)     Status: None   Collection Time: 11/17/15  1:23 AM  Result Value Ref Range Status   Specimen Description BLOOD RIGHT HAND  Final   Special Requests IN PEDIATRIC BOTTLE 3CC  Final   Culture NO GROWTH 5 DAYS  Final   Report Status 11/22/2015 FINAL  Final     ASSESSMENT: She is showing slow improvement. Pathology and other studies from her esophageal biopsies are pending. I will continue ganciclovir because of the high likelihood of CMV esophagitis. She is responding well to initial antiretroviral therapy.  PLAN: 1. Continue current antiretroviral, viral and antimicrobial therapies  Cliffton Asters, MD [facility] [phone] pager   [phone] cell 11/24/2015, 3:41 PM

## 2015-11-24 NOTE — Progress Notes (Signed)
Patient ID: Samantha Kline, female   DOB: 1960-10-28, 55 y.o.   MRN: [id number]  PROGRESS NOTE    Yesha Muchow  NWG:[id number] DOB: [birth year] DOA: 11/06/2015  PCP: No PCP Per Patient   Brief Narrative:  55 y.o. female with past medical history significant for recently diagnosed HIV, recent hospitalization from 10/24/2015 through 10/31/2015 for new diagnosis of HIV pneumonia. Patient was subsequently discharged on Bactrim and prednisone with concerns for pneumocystis pneumonia. Apparently patient was doing well for couple of days after discharge but then started to decline with generalized weakness, loss of appetite, nausea, vomiting and confusion. Patient's family reported no blood with vomiting. No diarrhea. No fevers or chills.  In ED, patient was hemodynamically stable. Blood work was notable for sodium of 110, potassium 5.3, bicarbonate 18, glucose 161. Lactic acid was 2.65 but subsequently normalized. CT head showed no acute intracranial process. Chest x-ray showed no acute abnormalities.  Barrier to discharge: Ongoing fever, leukopenia and difficulty swallowing. ID following. Unable to estimate stability for discharge currently.   Assessment & Plan: Acute metabolic encephalopathy / hypoosmolar hyponatremia -Likely secondary to severe hyponatremia. Sodium was as low as 110 on the admission -Hyponatremia presumed to be secondary to lungs infection and also likely because of Bactrim -No acute findings on CT head -Sodium 128-132 now and stable -will monitor intermittently   Fever of unknown origin - Ongoing fever despite multiple abx. 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 Biaxin and ethambutol. Appreciate their assistance.  Oral thrush /  Dysphagia: and presumed esophageal candidiasis  - Continue fluconazole, dose increased per ID to 400 mg instead of 100 mg daily. She was started on fluconazole 400 mg daily and per ID to continue for 21 days  - dysphagia 2 diet - endoscopy showed multiples esophageal ulcers. Started on ganciclovir.    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   - completed treatment for PCP with prednisone, clindamycin and primaquine. - Continue Dapsone for prophylaxis - following ID rec's and giving ongoing fever and concerns for MAI, will be treated with biaxin and ethambutol  -CMV antibody was positive; will follow load (which is pending) to determine needs for treatment. -endoscopy with esophageal ulcers. Started on acyclovir.    Anemia of chronic disease -Hemoglobin ranges 7.6 - 8.5 range -no signs of acute bleeding -will follow trend and transfuse for Hgb < 7 Hb stable.   Leukopenia / ?neutropenia resolved.  - Likely from HIV  - no growth on further cx's - patient has completed active antibiotic treatment and currently is receiving treatment for HIV - will follow cell line trend  -no signs of acute bleeding   Hyperkalemia / Hypokalemia  - Secondary to Bactrim - She was given Kayexalate at some point; now with low K intermittently - will follow electrolytes and replete as needed -Mg WNL  Severe protein calorie malnutrition -In the context of chronic illness -Continue ensure supplementation and encouraged to increase PO intake -advancing diet as tolerated -started on megace by ID rec's -start amitriptyline for depression and stimulant.  -Discussed with Dr Ewing Schlein, observe over few days if no improvement after starteing Acyclovir, proceed with Tube feeding.    Stage 1 decubitus pressure  ulcer -continue constant repositioning and preventive measures/barrier creams  DVT prophylaxis: Stop Lovenox and use SCD's bilaterally  due to drop in platelets over past 2 days 102 --> 79 Code Status: full code  Family Communication: no family at bedside Disposition Plan:  Plan is for CIR tomorrow if ok with ID.    Consultants:   ID  SLP  GI, [facility], consulted 7/25  Procedures:   None   Antimicrobials:   HAART  Dapsone 11/08/2015 -->  Clindamycin 11/08/2015 -->completed course   Primaquine 11/08/2015 -->completed course   Fluconazole 11/11/2015 --> planning to treat for 21 days total   Azithromycin >>>stopped on 7/27 after treatment for MAI started   Biaxin 7/27>>>  Ethambutol 7/27>>>   Subjective: She is alert, answer questions. Slowly answering questions.  She is feeling sad, she denies pain with swallowing, she just does not want to eat.  She does not want tube feeding now.   Objective: Vitals:   11/23/15 1520 11/23/15 1525 11/23/15 2142 11/24/15 0637  BP: 139/82 139/82 129/79 118/74  Pulse: 88 84 86 (!) 106  Resp: (!) 25 (!) 25 (!) 28   Temp:   100.3 F (37.9 C) 99.2 F (37.3 C)  TempSrc:   Oral Oral  SpO2: 99% 100% 100% 98%  Weight:      Height:        Intake/Output Summary (Last 24 hours) at 11/24/15 1402 Last data filed at 11/24/15 0610  Gross per 24 hour  Intake              200 ml  Output                0 ml  Net              200 ml   Filed Weights   11/07/15 0900 11/16/15 0510 11/23/15 1407  Weight: 52.8 kg (116 lb 4.8 oz) 60.6 kg (133 lb 11.2 oz) 60.6 kg (133 lb 11.2 oz)    Examination:  General exam: dry MM, no oral thrush appreciated on oral exam, reports no vomiting or diarrhea. Has continue intermittent episodes of low grade temp (much improved overall). Frail, underweight and chronically ill in appearance.   Respiratory system: No wheezing, diminished breath sounds  Cardiovascular system: S1 & S2 (+), tachycardic  Gastrointestinal system: (+) BS, no tenderness, no distention  Central nervous system: Nonfocal  Extremities: No swelling, palpable pulses  Skin:  warm, dry, stage 1 pressure ulcer in her decubitus area    Data Reviewed: I have personally reviewed following labs and imaging studies  CBC:  Recent Labs Lab 11/18/15 0522 11/24/15 0454  WBC 1.6* 10.3  NEUTROABS 0.9*  --   HGB 7.6* 9.3*  HCT 22.7* 28.8*  MCV 91.5 93.5  PLT 115* 195   Basic Metabolic Panel:  Recent Labs Lab 11/19/15 0444 11/20/15 1206 11/21/15 0800 11/23/15 0928 11/24/15 0454  NA 129* 131* 132* 135 132*  K 2.8* 2.6* 3.6 3.0* 3.7  CL 101 104 108 109 103  CO2 GLUCOSE 99 125* 119* 210* 99  BUN CREATININE 0.81 0.90 0.67 0.71 0.61  CALCIUM 7.1* 7.1* 7.1* 6.9* 7.3*  MG  --   --  2.0  --  1.7   GFR: Estimated Creatinine Clearance: 67.8 mL/min (by C-G formula based on SCr of 0.8 mg/dL).  Urine analysis:    Component Value Date/Time   COLORURINE YELLOW 11/06/2015 2236   APPEARANCEUR  CLEAR 11/06/2015 2236   LABSPEC 1.029 11/06/2015 2236   PHURINE 6.0 11/06/2015 2236   GLUCOSEU NEGATIVE 11/06/2015 2236   HGBUR NEGATIVE 11/06/2015 2236   BILIRUBINUR NEGATIVE 11/06/2015 2236   KETONESUR NEGATIVE 11/06/2015 2236   PROTEINUR 30 (A) 11/06/2015 2236   NITRITE NEGATIVE 11/06/2015 2236   LEUKOCYTESUR NEGATIVE 11/06/2015 2236    Recent Results (from the past 240 hour(s))  Culture, blood (Routine x 2)     Status: None (Preliminary result)   Collection Time: 11/06/15  8:30 PM  Result Value Ref Range Status   Specimen Description BLOOD LEFT ARM  Final   Special Requests IN PEDIATRIC BOTTLE 2CC  Final   Culture NO GROWTH 3 DAYS  Final   Report Status PENDING  Incomplete  Culture, blood (Routine x 2)     Status: None (Preliminary result)   Collection Time: 11/06/15  8:45 PM  Result Value Ref Range Status   Specimen Description BLOOD LEFT ARM  Final   Special Requests BOTTLES DRAWN AEROBIC AND ANAEROBIC 4CC   Final   Culture NO GROWTH 3 DAYS  Final   Report Status PENDING  Incomplete  Urine culture     Status: None    Collection Time: 11/06/15 10:36 PM  Result Value Ref Range Status   Specimen Description URINE, CATHETERIZED  Final   Special Requests NONE  Final   Culture NO GROWTH  Final   Report Status 11/08/2015 FINAL  Final  MRSA PCR Screening     Status: None   Collection Time: 11/09/15 11:54 AM  Result Value Ref Range Status   MRSA by PCR NEGATIVE NEGATIVE Final     Radiology Studies: Dg Chest Port 1 View 11/11/2015 Low lung volumes. Subtle opacity at the left lung base, suspicious for pneumonia. Aortic atherosclerosis.   Dg Chest 1 View 11/06/2015  No acute abnormalities.   Ct Head Wo Contrast 11/07/2015  No acute intracranial process.    Marland Kitchen amitriptyline  10 mg Oral QHS  . antiseptic oral rinse  7 mL Mouth Rinse BID  . clarithromycin  500 mg Oral Q12H  . dolutegravir  50 mg Oral Q1200  . dronabinol  2.5 mg Oral BID AC  . emtricitabine-tenofovir AF  1 tablet Oral Q1200  . ethambutol  900 mg Oral Daily  . feeding supplement (ENSURE ENLIVE)  237 mL Oral QID  . ferrous sulfate  325 mg Oral Q breakfast  . fluconazole (DIFLUCAN) IV  400 mg Intravenous Q24H  . ganciclovir (CYTOVENE) IV  2.5 mg/kg Intravenous Q12H  . multivitamin with minerals  1 tablet Oral Daily  . sodium chloride flush  3 mL Intravenous Q12H  . Tbo-filgastrim (GRANIX) SQ  300 mcg Subcutaneous Q24H  . vitamin B-12  100 mcg Oral Daily  . Vitamin D (Ergocalciferol)  50,000 Units Oral Q Fri     Continuous Infusions: . sodium chloride 20 mL/hr at 11/23/15 1719  . dextrose 5 % and 0.9% NaCl 75 mL/hr at 11/24/15 0533     LOS: 17 days    Time spent: 25 minutes    Alba Cory, MD [facility] Pager [phone]  If 7PM-7AM, please contact night-coverage [email or web address] Password [email or web address] 11/24/2015, 2:02 PM

## 2015-11-24 NOTE — Progress Notes (Signed)
Physical Therapy Treatment Patient Details Name: Samantha Kline MRN: [id number] DOB: 01-Aug-1960 Today's Date: 11/24/2015    History of Present Illness 55 y.o. female with medical history significant of recently diagnosed HIV, recently treated pneumonia, who presents with altered mental status, generalized weakness, nausea and vomiting.Head CT and CXR negative. 50 lb unintentional weight loss over the past 5 - 6 months along with decreased appetite.     PT Comments    Patient is making gradual progress toward mobility goals. Pt appeared to be feeling better today and with more volume to speech. Pt continues to be limited by fatigue but is willing to work with P. Pt expressed fear of falling today that she had not expressed previously--may need a little extra encouragement. Current plan remains appropriate.    Follow Up Recommendations  CIR     Equipment Recommendations  Other (comment) (TBD)    Recommendations for Other Services       Precautions / Restrictions Precautions Precautions: Fall Restrictions Weight Bearing Restrictions: No    Mobility  Bed Mobility Overal bed mobility: Needs Assistance Bed Mobility: Supine to Sit     Supine to sit: Max assist;+2 for physical assistance     General bed mobility comments: Assist to mobilize bilat LE, elevate trunk into sitting, and scoot hips to EOB with use of bed pad; cues for sequencing and hand placement; pt continues to maintain bilat wrist flexion and multimodal cues given for positioning of hands in sitting  Transfers Overall transfer level: Needs assistance Equipment used: Rolling walker (2 wheeled) Transfers: Sit to/from [employer] Sit to Stand: +2 physical assistance;Min assist Stand pivot transfers: Mod assist;+2 safety/equipment       General transfer comment: assist to stand from EOB with pt c/o feeling "wobbly" and returned to sitting with cues for safe hand placement; pt required assist to place R  hand on RW with each stand; assist to pivot to recliner  Ambulation/Gait                 Stairs            Wheelchair Mobility    Modified Rankin (Stroke Patients Only)       Balance     Sitting balance-Leahy Scale: Fair   Postural control: Posterior lean;Right lateral lean (maintains sitting with min guard; cannot accept ANYchallenge)   Standing balance-Leahy Scale: Poor                      Cognition Arousal/Alertness: Awake/alert Behavior During Therapy: Flat affect Overall Cognitive Status: Impaired/Different from baseline Area of Impairment: Following commands;Problem solving       Following Commands: Follows one step commands with increased time   Awareness: Anticipatory Problem Solving: Slow processing;Difficulty sequencing;Requires verbal cues;Requires tactile cues;Decreased initiation      Exercises      General Comments General comments (skin integrity, edema, etc.): pt fearful of falling this session; HR in 140s with OOB mobility      Pertinent Vitals/Pain Pain Assessment: Faces Faces Pain Scale: Hurts even more Pain Location: buttocks with bed mobility/transitional movements Pain Descriptors / Indicators: Sore Pain Intervention(s): Limited activity within patient's tolerance;Monitored during session;Repositioned    Home Living                      Prior Function            PT Goals (current goals can now be found in the care plan section) Acute  Rehab PT Goals Patient Stated Goal: None stated PT Goal Formulation: With patient Time For Goal Achievement: 12/06/15 Potential to Achieve Goals: Fair Progress towards PT goals: Progressing toward goals    Frequency  Min 3X/week    PT Plan Current plan remains appropriate    Co-evaluation             End of Session Equipment Utilized During Treatment: Gait belt Activity Tolerance: Patient limited by fatigue Patient left: in chair;with call bell/phone  within reach;with chair alarm set     Time: 1245-8099 PT Time Calculation (min) (ACUTE ONLY): 34 min  Charges:  $Therapeutic Activity: 23-37 mins                    G Codes:      Derek Mound, PTA Pager: [phone]   11/24/2015, 1:41 PM

## 2015-11-25 ENCOUNTER — Inpatient Hospital Stay (HOSPITAL_COMMUNITY)
Admission: RE | Admit: 2015-11-25 | Discharge: 2015-12-23 | DRG: 977 | Disposition: A | Discharge status: Home Health | Payer: Managed Care, Other (non HMO) | Source: Intra-hospital | Attending: Physical Medicine & Rehabilitation | Admitting: Physical Medicine & Rehabilitation

## 2015-11-25 DIAGNOSIS — Z87891 Personal history of nicotine dependence: Secondary | ICD-10-CM | POA: Diagnosis not present

## 2015-11-25 DIAGNOSIS — IMO0002 Reserved for concepts with insufficient information to code with codable children: Secondary | ICD-10-CM

## 2015-11-25 DIAGNOSIS — Z66 Do not resuscitate: Secondary | ICD-10-CM | POA: Diagnosis present

## 2015-11-25 DIAGNOSIS — K2210 Ulcer of esophagus without bleeding: Secondary | ICD-10-CM | POA: Diagnosis present

## 2015-11-25 DIAGNOSIS — R627 Adult failure to thrive: Secondary | ICD-10-CM | POA: Diagnosis present

## 2015-11-25 DIAGNOSIS — Z09 Encounter for follow-up examination after completed treatment for conditions other than malignant neoplasm: Secondary | ICD-10-CM

## 2015-11-25 DIAGNOSIS — L89152 Pressure ulcer of sacral region, stage 2: Secondary | ICD-10-CM | POA: Diagnosis present

## 2015-11-25 DIAGNOSIS — G9340 Encephalopathy, unspecified: Secondary | ICD-10-CM | POA: Diagnosis present

## 2015-11-25 DIAGNOSIS — G9349 Other encephalopathy: Secondary | ICD-10-CM | POA: Diagnosis present

## 2015-11-25 DIAGNOSIS — R4702 Dysphasia: Secondary | ICD-10-CM | POA: Diagnosis present

## 2015-11-25 DIAGNOSIS — T451X5A Adverse effect of antineoplastic and immunosuppressive drugs, initial encounter: Secondary | ICD-10-CM | POA: Diagnosis present

## 2015-11-25 DIAGNOSIS — R000 Tachycardia, unspecified: Secondary | ICD-10-CM | POA: Diagnosis not present

## 2015-11-25 DIAGNOSIS — B20 Human immunodeficiency virus [HIV] disease: Secondary | ICD-10-CM | POA: Diagnosis present

## 2015-11-25 DIAGNOSIS — D529 Folate deficiency anemia, unspecified: Secondary | ICD-10-CM | POA: Diagnosis present

## 2015-11-25 DIAGNOSIS — R1310 Dysphagia, unspecified: Secondary | ICD-10-CM | POA: Diagnosis not present

## 2015-11-25 DIAGNOSIS — D638 Anemia in other chronic diseases classified elsewhere: Secondary | ICD-10-CM | POA: Diagnosis present

## 2015-11-25 DIAGNOSIS — I10 Essential (primary) hypertension: Secondary | ICD-10-CM | POA: Diagnosis present

## 2015-11-25 DIAGNOSIS — I959 Hypotension, unspecified: Secondary | ICD-10-CM | POA: Diagnosis present

## 2015-11-25 DIAGNOSIS — B59 Pneumocystosis: Secondary | ICD-10-CM

## 2015-11-25 DIAGNOSIS — E871 Hypo-osmolality and hyponatremia: Secondary | ICD-10-CM | POA: Diagnosis present

## 2015-11-25 DIAGNOSIS — S63283A Dislocation of proximal interphalangeal joint of left middle finger, initial encounter: Secondary | ICD-10-CM | POA: Diagnosis not present

## 2015-11-25 DIAGNOSIS — Z79899 Other long term (current) drug therapy: Secondary | ICD-10-CM

## 2015-11-25 DIAGNOSIS — N390 Urinary tract infection, site not specified: Secondary | ICD-10-CM | POA: Diagnosis not present

## 2015-11-25 DIAGNOSIS — Z515 Encounter for palliative care: Secondary | ICD-10-CM | POA: Diagnosis not present

## 2015-11-25 DIAGNOSIS — D61810 Antineoplastic chemotherapy induced pancytopenia: Secondary | ICD-10-CM | POA: Diagnosis present

## 2015-11-25 DIAGNOSIS — B9620 Unspecified Escherichia coli [E. coli] as the cause of diseases classified elsewhere: Secondary | ICD-10-CM | POA: Diagnosis not present

## 2015-11-25 DIAGNOSIS — Z681 Body mass index (BMI) 19 or less, adult: Secondary | ICD-10-CM | POA: Diagnosis not present

## 2015-11-25 DIAGNOSIS — W19XXXA Unspecified fall, initial encounter: Secondary | ICD-10-CM | POA: Diagnosis not present

## 2015-11-25 DIAGNOSIS — R5381 Other malaise: Principal | ICD-10-CM | POA: Diagnosis present

## 2015-11-25 DIAGNOSIS — E876 Hypokalemia: Secondary | ICD-10-CM | POA: Diagnosis present

## 2015-11-25 DIAGNOSIS — D702 Other drug-induced agranulocytosis: Secondary | ICD-10-CM | POA: Diagnosis not present

## 2015-11-25 DIAGNOSIS — A310 Pulmonary mycobacterial infection: Secondary | ICD-10-CM | POA: Diagnosis present

## 2015-11-25 DIAGNOSIS — E43 Unspecified severe protein-calorie malnutrition: Secondary | ICD-10-CM | POA: Diagnosis present

## 2015-11-25 DIAGNOSIS — D521 Drug-induced folate deficiency anemia: Secondary | ICD-10-CM | POA: Diagnosis not present

## 2015-11-25 DIAGNOSIS — W19XXXD Unspecified fall, subsequent encounter: Secondary | ICD-10-CM | POA: Diagnosis not present

## 2015-11-25 DIAGNOSIS — R609 Edema, unspecified: Secondary | ICD-10-CM

## 2015-11-25 DIAGNOSIS — K208 Other esophagitis: Secondary | ICD-10-CM

## 2015-11-25 DIAGNOSIS — R4189 Other symptoms and signs involving cognitive functions and awareness: Secondary | ICD-10-CM | POA: Diagnosis present

## 2015-11-25 DIAGNOSIS — B370 Candidal stomatitis: Secondary | ICD-10-CM | POA: Diagnosis present

## 2015-11-25 DIAGNOSIS — L98429 Non-pressure chronic ulcer of back with unspecified severity: Secondary | ICD-10-CM | POA: Diagnosis present

## 2015-11-25 DIAGNOSIS — R630 Anorexia: Secondary | ICD-10-CM | POA: Diagnosis not present

## 2015-11-25 DIAGNOSIS — Z7189 Other specified counseling: Secondary | ICD-10-CM | POA: Diagnosis not present

## 2015-11-25 MED ORDER — AMITRIPTYLINE HCL 10 MG PO TABS: 10.0000 mg | ORAL_TABLET | Freq: Every day | ORAL | 0 refills | Status: DC

## 2015-11-25 MED ORDER — AMITRIPTYLINE HCL 10 MG PO TABS
10.0000 mg | ORAL_TABLET | Freq: Every day | ORAL | Status: DC
  Administered 2015-11-25 – 2015-12-06 (×12): 10 mg via ORAL
  Filled 2015-11-25 (×12): qty 1

## 2015-11-25 MED ORDER — ACETAMINOPHEN 325 MG PO TABS
325.0000 mg | ORAL_TABLET | ORAL | Status: DC | PRN
  Administered 2015-11-27 – 2015-12-18 (×7): 650 mg via ORAL
  Filled 2015-11-25 (×8): qty 2

## 2015-11-25 MED ORDER — FERROUS SULFATE 325 (65 FE) MG PO TABS
325.0000 mg | ORAL_TABLET | Freq: Every day | ORAL | Status: DC
  Administered 2015-11-26 – 2015-12-23 (×27): 325 mg via ORAL
  Filled 2015-11-25 (×29): qty 1

## 2015-11-25 MED ORDER — CETYLPYRIDINIUM CHLORIDE 0.05 % MT LIQD: 7.0000 mL | Freq: Two times a day (BID) | OROMUCOSAL | 0 refills | Status: DC

## 2015-11-25 MED ORDER — FLUCONAZOLE IN SODIUM CHLORIDE 400-0.9 MG/200ML-% IV SOLN: 400.0000 mg | INTRAVENOUS | Status: DC

## 2015-11-25 MED ORDER — ENSURE ENLIVE PO LIQD: 237.0000 mL | Freq: Four times a day (QID) | ORAL | 12 refills | Status: DC

## 2015-11-25 MED ORDER — PROCHLORPERAZINE MALEATE 5 MG PO TABS
5.0000 mg | ORAL_TABLET | Freq: Four times a day (QID) | ORAL | Status: DC | PRN
  Administered 2015-11-27 – 2015-12-22 (×6): 5 mg via ORAL
  Administered 2015-12-23: 10 mg via ORAL
  Filled 2015-11-25 (×4): qty 1
  Filled 2015-11-25 (×2): qty 2
  Filled 2015-11-25: qty 1

## 2015-11-25 MED ORDER — DEXTROSE-NACL 5-0.9 % IV SOLN: 75.0000 mL | INTRAVENOUS | 0 refills | Status: DC

## 2015-11-25 MED ORDER — TRAZODONE HCL 50 MG PO TABS: 25.0000 mg | ORAL_TABLET | Freq: Every evening | ORAL | Status: DC | PRN

## 2015-11-25 MED ORDER — ALBUTEROL SULFATE (2.5 MG/3ML) 0.083% IN NEBU: 2.5000 mg | INHALATION_SOLUTION | Freq: Four times a day (QID) | RESPIRATORY_TRACT | 12 refills | Status: DC | PRN

## 2015-11-25 MED ORDER — DAPSONE 100 MG PO TABS: 100.0000 mg | ORAL_TABLET | Freq: Every day | ORAL | 0 refills | Status: DC

## 2015-11-25 MED ORDER — DRONABINOL 2.5 MG PO CAPS: 2.5000 mg | ORAL_CAPSULE | Freq: Two times a day (BID) | ORAL | 0 refills | Status: DC

## 2015-11-25 MED ORDER — CETYLPYRIDINIUM CHLORIDE 0.05 % MT LIQD
7.0000 mL | Freq: Two times a day (BID) | OROMUCOSAL | Status: DC
  Administered 2015-11-25 – 2015-12-16 (×36): 7 mL via OROMUCOSAL

## 2015-11-25 MED ORDER — VITAMIN D (ERGOCALCIFEROL) 1.25 MG (50000 UNIT) PO CAPS
50000.0000 [IU] | ORAL_CAPSULE | ORAL | Status: DC
  Administered 2015-12-02 – 2015-12-23 (×4): 50000 [IU] via ORAL
  Filled 2015-11-25 (×4): qty 1

## 2015-11-25 MED ORDER — EMTRICITABINE-TENOFOVIR AF 200-25 MG PO TABS
1.0000 | ORAL_TABLET | Freq: Every day | ORAL | Status: DC
  Administered 2015-11-26 – 2015-12-22 (×27): 1 via ORAL
  Filled 2015-11-25 (×28): qty 1

## 2015-11-25 MED ORDER — SODIUM CHLORIDE 0.9 % IV SOLN
2.5000 mg/kg | Freq: Two times a day (BID) | INTRAVENOUS | Status: DC
  Administered 2015-11-25 – 2015-12-05 (×20): 150 mg via INTRAVENOUS
  Filled 2015-11-25 (×25): qty 150

## 2015-11-25 MED ORDER — DRONABINOL 2.5 MG PO CAPS
2.5000 mg | ORAL_CAPSULE | Freq: Two times a day (BID) | ORAL | Status: DC
  Administered 2015-11-26 – 2015-11-30 (×10): 2.5 mg via ORAL
  Filled 2015-11-25 (×10): qty 1

## 2015-11-25 MED ORDER — ONDANSETRON HCL 4 MG/2ML IJ SOLN
4.0000 mg | Freq: Three times a day (TID) | INTRAMUSCULAR | Status: DC | PRN
  Administered 2015-12-14: 4 mg via INTRAVENOUS
  Filled 2015-11-25 (×2): qty 2

## 2015-11-25 MED ORDER — DOLUTEGRAVIR SODIUM 50 MG PO TABS: 50.0000 mg | ORAL_TABLET | Freq: Every day | ORAL | 0 refills | Status: DC

## 2015-11-25 MED ORDER — SENNOSIDES-DOCUSATE SODIUM 8.6-50 MG PO TABS: 1.0000 | ORAL_TABLET | Freq: Every evening | ORAL | Status: DC | PRN

## 2015-11-25 MED ORDER — DOLUTEGRAVIR SODIUM 50 MG PO TABS
50.0000 mg | ORAL_TABLET | Freq: Every day | ORAL | Status: DC
  Administered 2015-11-26 – 2015-12-22 (×27): 50 mg via ORAL
  Filled 2015-11-25 (×28): qty 1

## 2015-11-25 MED ORDER — EMTRICITABINE-TENOFOVIR AF 200-25 MG PO TABS: 1.0000 | ORAL_TABLET | Freq: Every day | ORAL | 0 refills | Status: DC

## 2015-11-25 MED ORDER — DAPSONE 100 MG PO TABS
100.0000 mg | ORAL_TABLET | Freq: Every day | ORAL | Status: DC
  Administered 2015-11-26 – 2015-12-23 (×28): 100 mg via ORAL
  Filled 2015-11-25 (×29): qty 1

## 2015-11-25 MED ORDER — ALBUTEROL SULFATE (2.5 MG/3ML) 0.083% IN NEBU: 2.5000 mg | INHALATION_SOLUTION | Freq: Four times a day (QID) | RESPIRATORY_TRACT | Status: DC | PRN

## 2015-11-25 MED ORDER — SODIUM CHLORIDE 0.9 % IV SOLN: 2.5000 mg/kg | Freq: Two times a day (BID) | INTRAVENOUS | 0 refills | Status: DC

## 2015-11-25 MED ORDER — ADULT MULTIVITAMIN W/MINERALS CH: 1.0000 | ORAL_TABLET | Freq: Every day | ORAL | 0 refills | Status: DC

## 2015-11-25 MED ORDER — CYANOCOBALAMIN 100 MCG PO TABS: 100.0000 ug | ORAL_TABLET | Freq: Every day | ORAL | 0 refills | Status: DC

## 2015-11-25 MED ORDER — PROCHLORPERAZINE EDISYLATE 5 MG/ML IJ SOLN
5.0000 mg | Freq: Four times a day (QID) | INTRAMUSCULAR | Status: DC | PRN
  Administered 2015-12-06: 10 mg via INTRAMUSCULAR
  Filled 2015-11-25: qty 2

## 2015-11-25 MED ORDER — ENSURE ENLIVE PO LIQD
237.0000 mL | Freq: Four times a day (QID) | ORAL | Status: DC
  Administered 2015-11-25 – 2015-12-13 (×51): 237 mL via ORAL

## 2015-11-25 MED ORDER — VITAMIN B-12 100 MCG PO TABS
100.0000 ug | ORAL_TABLET | Freq: Every day | ORAL | Status: DC
  Administered 2015-11-26 – 2015-12-23 (×27): 100 ug via ORAL
  Filled 2015-11-25 (×28): qty 1

## 2015-11-25 MED ORDER — GUAIFENESIN-DM 100-10 MG/5ML PO SYRP: 5.0000 mL | ORAL_SOLUTION | Freq: Four times a day (QID) | ORAL | Status: DC | PRN

## 2015-11-25 MED ORDER — PROCHLORPERAZINE 25 MG RE SUPP: 12.5000 mg | Freq: Four times a day (QID) | RECTAL | Status: DC | PRN

## 2015-11-25 MED ORDER — DAPSONE 100 MG PO TABS
100.0000 mg | ORAL_TABLET | Freq: Every day | ORAL | Status: DC
  Administered 2015-11-25: 100 mg via ORAL
  Filled 2015-11-25: qty 1

## 2015-11-25 MED ORDER — FLEET ENEMA 7-19 GM/118ML RE ENEM
1.0000 | ENEMA | Freq: Once | RECTAL | Status: DC | PRN
Start: 2015-11-25 — End: 2015-12-23

## 2015-11-25 MED ORDER — BISACODYL 10 MG RE SUPP: 10.0000 mg | Freq: Every day | RECTAL | Status: DC | PRN

## 2015-11-25 MED ORDER — ADULT MULTIVITAMIN W/MINERALS CH
1.0000 | ORAL_TABLET | Freq: Every day | ORAL | Status: DC
  Administered 2015-11-26 – 2015-12-23 (×26): 1 via ORAL
  Filled 2015-11-25 (×26): qty 1

## 2015-11-25 MED ORDER — CLARITHROMYCIN 500 MG PO TABS
500.0000 mg | ORAL_TABLET | Freq: Two times a day (BID) | ORAL | Status: DC
  Administered 2015-11-25 – 2015-11-30 (×10): 500 mg via ORAL
  Filled 2015-11-25 (×11): qty 1

## 2015-11-25 MED ORDER — DIPHENHYDRAMINE HCL 12.5 MG/5ML PO ELIX: 12.5000 mg | ORAL_SOLUTION | Freq: Four times a day (QID) | ORAL | Status: DC | PRN

## 2015-11-25 MED ORDER — DEXTROSE-NACL 5-0.9 % IV SOLN
INTRAVENOUS | Status: DC
  Administered 2015-11-25 – 2015-11-28 (×5): via INTRAVENOUS

## 2015-11-25 MED ORDER — ETHAMBUTOL HCL 100 MG PO TABS
900.0000 mg | ORAL_TABLET | Freq: Every day | ORAL | Status: DC
  Administered 2015-11-26 – 2015-11-30 (×5): 900 mg via ORAL
  Filled 2015-11-25 (×5): qty 1

## 2015-11-25 MED ORDER — DM-GUAIFENESIN ER 30-600 MG PO TB12: 1.0000 | ORAL_TABLET | Freq: Two times a day (BID) | ORAL | Status: DC | PRN

## 2015-11-25 MED ORDER — CLARITHROMYCIN 500 MG PO TABS: 500.0000 mg | ORAL_TABLET | Freq: Two times a day (BID) | ORAL | 0 refills | Status: DC

## 2015-11-25 MED ORDER — FERROUS SULFATE 325 (65 FE) MG PO TABS: 325.0000 mg | ORAL_TABLET | Freq: Every day | ORAL | 3 refills | Status: DC

## 2015-11-25 MED ORDER — ALUM & MAG HYDROXIDE-SIMETH 200-200-20 MG/5ML PO SUSP: 30.0000 mL | ORAL | Status: DC | PRN

## 2015-11-25 MED ORDER — FLUCONAZOLE IN SODIUM CHLORIDE 400-0.9 MG/200ML-% IV SOLN: 400.0000 mg | INTRAVENOUS | 0 refills | Status: DC

## 2015-11-25 MED ORDER — ETHAMBUTOL HCL 100 MG PO TABS: 900.0000 mg | ORAL_TABLET | Freq: Every day | ORAL | 0 refills | Status: DC

## 2015-11-25 NOTE — Progress Notes (Signed)
Pharmacy Antibiotic Note  Samantha Kline is a 55 y.o. female admitted on 11/25/2015 with CMV.  Pt has been transferred to Rehab and Pharmacy has been consulted for continued Ganciclovir dosing.  Plan: Ganciclovir 2.5mg /kg IV q12, next dose due at 6PM Monitor CBC     Temp (24hrs), Avg:98.2 F (36.8 C), Min:97.9 F (36.6 C), Max:98.6 F (37 C)   Recent Labs Lab 11/19/15 0444 11/20/15 1206 11/21/15 0800 11/23/15 0928 11/24/15 0454  WBC  --   --   --   --  10.3  CREATININE 0.81 0.90 0.67 0.71 0.61    Estimated Creatinine Clearance: 67.8 mL/min (by C-G formula based on SCr of 0.8 mg/dL).    Allergies  Allergen Reactions  . Septra [Sulfamethoxazole-Trimethoprim]     Possible cause of hyponatremia    Antimicrobials this admission: -Fluconzaole 400 mg for candidal thrush- continue IV for now per GI/ID. 21 days planned. 7/21>> (8/10) -Clarithromycin/Ethambutol empirically for MAI: 7/27>> -Gancyclovir 8/2 >>   Dose adjustments this admission: n/a  Microbiology results: 7/16 BC, UC neg - 7/19 MRSA PCR negative - 7/21 BCx: neg - 7/21 Acid fast smear : In process  7/25 Chest CT: Improved aeration of the lungs with residual ill-defined tree-in-bud opacities with bilateral lung bases- No new or worsening focal airspace opacities.  -7/27 BCx x2: neg -7/29 CMV Ab + -  Thank you for allowing pharmacy to be a part of this patient's care.  Marisue Humble, PharmD Clinical Pharmacist [facility]

## 2015-11-25 NOTE — Care Management Note (Signed)
Case Management Note  Patient Details  Name: Samantha Kline MRN: [id number] Date of Birth: 1960-07-23  Subjective/Objective:                    Action/Plan: Plan is to d/c to CIR today.  Expected Discharge Date:   11/25/15           Expected Discharge Plan:  IP Rehab Facility  In-House Referral:     Discharge planning Services  CM Consult  PosStatus of Service:  completed  If discussed at Long Length of Stay Meetings, dates discussed:    Additional Comments:  Epifanio Lesches, RN 11/25/2015, 9:51 AM

## 2015-11-25 NOTE — Progress Notes (Signed)
Subjective:  No new complaints patient feels better but still some dysphasia  Antibiotics:  Anti-infectives    Start     Dose/Rate Route Frequency Ordered Stop   11/26/15 1600  fluconazole (DIFLUCAN) IVPB 400 mg  Status:  Discontinued     400 mg 100 mL/hr over 120 Minutes Intravenous Every 24 hours 11/25/15 1726 11/25/15 1819   11/26/15 1200  dolutegravir (TIVICAY) tablet 50 mg     50 mg Oral Daily 11/25/15 1726     11/26/15 1200  emtricitabine-tenofovir AF (DESCOVY) 200-25 MG per tablet 1 tablet     1 tablet Oral Daily 11/25/15 1726     11/26/15 0800  dapsone tablet 100 mg     100 mg Oral Daily 11/25/15 1726     11/26/15 0800  ethambutol (MYAMBUTOL) tablet 900 mg    Comments:  Adjust dose as needed   900 mg Oral Daily 11/25/15 1726     11/25/15 2000  clarithromycin (BIAXIN) tablet 500 mg    Comments:  Can use suspension if she has trouble swallowing   500 mg Oral Every 12 hours 11/25/15 1726     11/25/15 1800  ganciclovir (CYTOVENE) 150 mg in sodium chloride 0.9 % 100 mL IVPB     2.5 mg/kg  60.6 kg 100 mL/hr over 60 Minutes Intravenous Every 12 hours 11/25/15 1726        Medications: Scheduled Meds: . amitriptyline  10 mg Oral QHS  . antiseptic oral rinse  7 mL Mouth Rinse BID  . clarithromycin  500 mg Oral Q12H  . [START ON 11/26/2015] dapsone  100 mg Oral Daily  . [START ON 11/26/2015] dolutegravir  50 mg Oral Q1200  . [START ON 11/26/2015] dronabinol  2.5 mg Oral BID AC  . [START ON 11/26/2015] emtricitabine-tenofovir AF  1 tablet Oral Q1200  . [START ON 11/26/2015] ethambutol  900 mg Oral Daily  . feeding supplement (ENSURE ENLIVE)  237 mL Oral QID  . [START ON 11/26/2015] ferrous sulfate  325 mg Oral Q breakfast  . ganciclovir (CYTOVENE) IV  2.5 mg/kg Intravenous Q12H  . [START ON 11/26/2015] multivitamin with minerals  1 tablet Oral Daily  . [START ON 11/26/2015] vitamin B-12  100 mcg Oral Daily  . [START ON 12/02/2015] Vitamin D (Ergocalciferol)  50,000 Units Oral Q  Fri   Continuous Infusions: . dextrose 5 % and 0.9% NaCl 75 mL/hr at 11/25/15 1730   PRN Meds:.acetaminophen, albuterol, alum & mag hydroxide-simeth, bisacodyl, dextromethorphan-guaiFENesin, diphenhydrAMINE, guaiFENesin-dextromethorphan, ondansetron, prochlorperazine **OR** prochlorperazine **OR** prochlorperazine, senna-docusate, sodium phosphate, traZODone    Objective: Weight change:  No intake or output data in the 24 hours ending 11/25/15 1819 Blood pressure 123/76, pulse 94, temperature 99 F (37.2 C), temperature source Oral, resp. rate 18, height 5\' 4"  (1.626 m), weight 133 lb (60.3 kg), SpO2 97 %. Temp:  [97.9 F (36.6 C)-99 F (37.2 C)] 99 F (37.2 C) (08/04 1755) Pulse Rate:  [48-99] 94 (08/04 1755) Resp:  [18] 18 (08/04 1755) BP: (112-128)/(76-81) 123/76 (08/04 1755) SpO2:  [86 %-100 %] 97 % (08/04 1755) Weight:  [133 lb (60.3 kg)] 133 lb (60.3 kg) (08/04 1755)  Physical Exam: General: weak but awake and oriented to person and hospital and reason she is here  HEENT: anicteric sclera, pupils reactive to light and accommodation, EOMI CVS tachycardic rate, normal r,  no murmur rubs or gallops Chest: clear to auscultation bilaterally, no wheezing, rales or rhonchi Abdomen: soft not especially  tender, nondistended, normal bowel sounds, Extremities: no  clubbing or edema noted bilaterally Skin: no rashes Lymph: no new lymphadenopathy Neuro: nonfocal  CBC: CBC Latest Ref Rng & Units 11/24/2015 11/18/2015 11/17/2015  WBC 4.0 - 10.5 K/uL 10.3 1.6(L) 1.0(LL)  Hemoglobin 12.0 - 15.0 g/dL 8.4(O) 7.6(L) 8.2(L)  Hematocrit 36.0 - 46.0 % 28.8(L) 22.7(L) 24.2(L)  Platelets 150 - 400 K/uL 195 115(L) 90(L)      BMET  Recent Labs  11/23/15 0928 11/24/15 0454  NA 135 132*  K 3.0* 3.7  CL 109 103  CO2 22 23  GLUCOSE 210* 99  BUN 8 7  CREATININE 0.71 0.61  CALCIUM 6.9* 7.3*     Liver Panel  No results for input(s): PROT, ALBUMIN, AST, ALT, ALKPHOS, BILITOT, BILIDIR,  IBILI in the last 72 hours.     Sedimentation Rate No results for input(s): ESRSEDRATE in the last 72 hours. C-Reactive Protein No results for input(s): CRP in the last 72 hours.  Micro Results: Recent Results (from the past 720 hour(s))  Pneumocystis smear by DFA     Status: None   Collection Time: 10/28/15  9:09 PM  Result Value Ref Range Status   Specimen Source-PJSRC SPUTUM  Final   Pneumocystis jiroveci Ag NEGATIVE  Final    Comment: Performed at [facility]  Culture, blood (Routine x 2)     Status: None   Collection Time: 11/06/15  8:30 PM  Result Value Ref Range Status   Specimen Description BLOOD LEFT ARM  Final   Special Requests IN PEDIATRIC BOTTLE 2CC  Final   Culture NO GROWTH 5 DAYS  Final   Report Status 11/11/2015 FINAL  Final  Culture, blood (Routine x 2)     Status: None   Collection Time: 11/06/15  8:45 PM  Result Value Ref Range Status   Specimen Description BLOOD LEFT ARM  Final   Special Requests BOTTLES DRAWN AEROBIC AND ANAEROBIC 4CC   Final   Culture NO GROWTH 5 DAYS  Final   Report Status 11/11/2015 FINAL  Final  Urine culture     Status: None   Collection Time: 11/06/15 10:36 PM  Result Value Ref Range Status   Specimen Description URINE, CATHETERIZED  Final   Special Requests NONE  Final   Culture NO GROWTH  Final   Report Status 11/08/2015 FINAL  Final  MRSA PCR Screening     Status: None   Collection Time: 11/09/15 11:54 AM  Result Value Ref Range Status   MRSA by PCR NEGATIVE NEGATIVE Final    Comment:        The GeneXpert MRSA Assay (FDA approved for NASAL specimens only), is one component of a comprehensive MRSA colonization surveillance program. It is not intended to diagnose MRSA infection nor to guide or monitor treatment for MRSA infections.   Culture, blood (routine x 2)     Status: None   Collection Time: 11/11/15  4:50 PM  Result Value Ref Range Status   Specimen Description BLOOD LEFT ANTECUBITAL  Final     Special Requests BOTTLES DRAWN AEROBIC ONLY 5CC  Final   Culture NO GROWTH 5 DAYS  Final   Report Status 11/16/2015 FINAL  Final  Culture, blood (routine x 2)     Status: None   Collection Time: 11/11/15  4:55 PM  Result Value Ref Range Status   Specimen Description BLOOD BLOOD LEFT ARM  Final   Special Requests IN PEDIATRIC BOTTLE 1CC  Final  Culture NO GROWTH 5 DAYS  Final   Report Status 11/16/2015 FINAL  Final  Acid Fast Smear (AFB)     Status: None   Collection Time: 11/11/15  8:11 PM  Result Value Ref Range Status   AFB Specimen Processing Concentration  Final    Comment: (NOTE) Performed At: [facility] [address] [place], [place] [id number] Mila Homer MD JK:[id number]    Acid Fast Smear NOSMR  Final    Comment: (NOTE) The acid-fast bacilli (AFB) smear will not be performed due to the specimen source (blood) or submission of an insufficient quantity of specimen.    Source (AFB) BLOOD  Corrected  Culture, blood (routine x 2)     Status: None   Collection Time: 11/17/15  1:10 AM  Result Value Ref Range Status   Specimen Description BLOOD RIGHT ANTECUBITAL  Final   Special Requests BOTTLES DRAWN AEROBIC AND ANAEROBIC 10CC,5CC  Final   Culture NO GROWTH 5 DAYS  Final   Report Status 11/22/2015 FINAL  Final  Culture, blood (routine x 2)     Status: None   Collection Time: 11/17/15  1:23 AM  Result Value Ref Range Status   Specimen Description BLOOD RIGHT HAND  Final   Special Requests IN PEDIATRIC BOTTLE 3CC  Final   Culture NO GROWTH 5 DAYS  Final   Report Status 11/22/2015 FINAL  Final    Studies/Results: No results found.    Assessment/Plan:  INTERVAL HISTORY:   Dysphagia persists  11/23/15: EGD performed by Dr. Ewing Schlein (greatly appreciate this) and specimen sent to path but I cannot locate any PCR's for CMV or HSV EGD did show ulcers    Active Problems:   Physical debility    Samantha Kline is a 55 y.o. female with  Newly dx  HIV/AIDS rx for PCP admitted with FTT, esophagitis, fever, started on empiric rx for M avium, fluconazole for possible esophageal candidiasis.  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 Ewing Schlein seeing the patient again and patient for EGD that shows ulcers  And CMV culture pending (PCR is my preference here)   continue Gancyclovir + G-CSF support   #2 Possible M avium infection :  Fevers broke with empiric rx, AFB cultures will incubate for 6 weeks from blood   #3 HIV/AIDS: she has started Tivicay and Descovy a regimen that should afford her rapid virological suppression while it will give higher risk for IRIS and indeed her VL has dropped from 200K to 200 in only 3 weeks!  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 Thankfully she will go to rehab now!    Dr. Orvan Falconer will be covering for me this weekend.    LOS: 0 days   Acey Lav 11/25/2015, 6:19 PM

## 2015-11-25 NOTE — Discharge Summary (Signed)
Physician Discharge Summary  Samantha Kline AVW:[id number] DOB: 15-Sep-1960 DOA: 11/06/2015  PCP: No PCP Per Patient  Admit date: 11/06/2015 Discharge date: 11/25/2015  Admitted From: home  Disposition: CIR  Recommendations for Outpatient Follow-up:  1. Follow up with PCP in 1-2 weeks 2. Please obtain BMP/CBC in one week 3. Might need NG for tube feeding by Monday if no eating enough by Monday.     Discharge Condition: stable  CODE STATUS: full code.  Diet recommendation: diet soft   Brief/Interim Summary: Brief Narrative:  55 y.o. female with past medical history significant for recently diagnosed HIV, recent hospitalization from 10/24/2015 through 10/31/2015 for new diagnosis of HIV pneumonia. Patient was subsequently discharged on Bactrim and prednisone with concerns for pneumocystis pneumonia. Apparently patient was doing well for couple of days after discharge but then started to decline with generalized weakness, loss of appetite, nausea, vomiting and confusion. Patient's family reported no blood with vomiting. No diarrhea. No fevers or chills.  In ED, patient was hemodynamically stable. Blood work was notable for sodium of 110, potassium 5.3, bicarbonate 18, glucose 161. Lactic acid was 2.65 but subsequently normalized. CT head showed no acute intracranial process. Chest x-ray showed no acute abnormalities.  Barrier to discharge: Ongoing fever, leukopenia and difficulty swallowing. ID following. Unable to estimate stability for discharge currently.   Assessment & Plan: Acute metabolic encephalopathy / hypoosmolar hyponatremia -Likely secondary to severe hyponatremia. Sodium was as low as 110 on the admission -Hyponatremia presumed to be secondary to lungs infection and also likely because of Bactrim -No acute findings on CT head -Sodium 128-132 now and stable -will monitor intermittently  improving.   Fever of unknown origin - Ongoing fever despite multiple abx. Possible  PCP infection, IRIS (last one favored) - Blood cultures on admission and repeat cultures has remained negative - Most recent CXR showed low lung volumes and opacity at the left base (no new infiltrates)  -treatment empirically for MAI to be started today as per ID rec's. Biaxin and ethambutol. Appreciate their assistance.  Oral thrush / Dysphagia: and presumed esophageal candidiasis  - Continue fluconazole, dose increased per ID to 400 mg instead of 100 mg daily. She was started on fluconazole 400 mg daily and per ID to continue for 21 days  - dysphagia 2 diet - endoscopy showed multiples esophageal ulcers. Started on ganciclovir.    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   - completed treatment for PCP with prednisone, clindamycin and primaquine. - Continue Dapsone for prophylaxis - following ID rec's and giving ongoing fever and concerns for MAI, continue with biaxin and ethambutol  -CMV antibody was positive;  -endoscopy with esophageal ulcers. Started on acyclovir.    Anemia of chronic disease -Hemoglobin ranges 7.6 - 8.5 range -no signs of acute bleeding -will follow trend and transfuse for Hgb < 7 Hb stable.   Leukopenia / ?neutropenia resolved.  - Likely from HIV  - no growth on further cx's - patient has completed active antibiotic treatment and currently is receiving treatment for HIV - will follow cell line trend  -no signs of acute bleeding  -stop granix.   Hyperkalemia / Hypokalemia  - Secondary to Bactrim - She was given Kayexalate at some point; now with low K intermittently - will follow electrolytes and replete as needed -Mg WNL  Severe protein calorie malnutrition -In the context of chronic illness -Continue ensure supplementation and encouraged to increase  PO intake -advancing diet as tolerated -started on megace by ID rec's -started  Amitriptyline 8-03 for depression and  stimulant.  -Discussed with Dr Ewing Schlein, observe over few days if no improvement after starteing Acyclovir, proceed with Tube feeding.    Stage 1 decubitus pressure ulcer -continue constant repositioning and preventive measures/barrier creams  Discharge Diagnoses:  Principal Problem:   Esophageal ulcer Active Problems:   Human immunodeficiency virus (HIV) disease (HCC)   Protein-calorie malnutrition, severe (HCC)   Acute encephalopathy   Hyponatremia   Hyperkalemia   Nausea & vomiting   Pressure ulcer   Elevated lactic acid level   Arterial hypotension   Tachypnea   Tachycardia   Anemia of chronic disease   Candida infection, esophageal (HCC)   AIDS (HCC)   Physical deconditioning   Anemia due to bone marrow failure (HCC)   Cytomegalovirus (CMV) viremia (HCC)   Acute esophagitis   Failure to thrive in adult    Discharge Instructions  Discharge Instructions    Diet - low sodium heart healthy    Complete by:  As directed   Increase activity slowly    Complete by:  As directed       Medication List    STOP taking these medications   azithromycin 600 MG tablet Commonly known as:  ZITHROMAX   fluconazole 100 MG tablet Commonly known as:  DIFLUCAN   predniSONE 20 MG tablet Commonly known as:  DELTASONE   sulfamethoxazole-trimethoprim 800-160 MG tablet Commonly known as:  BACTRIM DS,SEPTRA DS     TAKE these medications   albuterol (2.5 MG/3ML) 0.083% nebulizer solution Commonly known as:  PROVENTIL Take 3 mLs (2.5 mg total) by nebulization every 6 (six) hours as needed for wheezing or shortness of breath.   amitriptyline 10 MG tablet Commonly known as:  ELAVIL Take 1 tablet (10 mg total) by mouth at bedtime.   antiseptic oral rinse 0.05 % Liqd solution Commonly known as:  CPC / CETYLPYRIDINIUM CHLORIDE 0.05% 7 mLs by Mouth Rinse route 2 (two) times daily.   clarithromycin 500 MG tablet Commonly known as:  BIAXIN Take 1 tablet (500 mg total) by mouth  every 12 (twelve) hours.   dapsone 100 MG tablet Take 1 tablet (100 mg total) by mouth daily.   dextrose 5 % and 0.9% NaCl 5-0.9 % infusion Inject 75 mLs into the vein continuous.   dolutegravir 50 MG tablet Commonly known as:  TIVICAY Take 1 tablet (50 mg total) by mouth daily at 12 noon.   dronabinol 2.5 MG capsule Commonly known as:  MARINOL Take 1 capsule (2.5 mg total) by mouth 2 (two) times daily before lunch and supper.   emtricitabine-tenofovir AF 200-25 MG tablet Commonly known as:  DESCOVY Take 1 tablet by mouth daily at 12 noon.   ethambutol 100 MG tablet Commonly known as:  MYAMBUTOL Take 9 tablets (900 mg total) by mouth daily.   feeding supplement (ENSURE ENLIVE) Liqd Take 237 mLs by mouth 4 (four) times daily.   ferrous sulfate 325 (65 FE) MG tablet Take 1 tablet (325 mg total) by mouth daily with breakfast.   fluconazole 400-0.9 MG/200ML-% IVPB Commonly known as:  DIFLUCAN Inject 200 mLs (400 mg total) into the vein daily.   ganciclovir 150 mg in sodium chloride 0.9 % 100 mL Inject 150 mg into the vein every 12 (twelve) hours.   multivitamin with minerals Tabs tablet Take 1 tablet by mouth daily.   vitamin B-12 100 MCG tablet Commonly known as:  CYANOCOBALAMIN Take 100 mcg by mouth daily. What changed:  Another medication with the same name was added. Make sure you understand how and when to take each.   cyanocobalamin 100 MCG tablet Take 1 tablet (100 mcg total) by mouth daily. What changed:  You were already taking a medication with the same name, and this prescription was added. Make sure you understand how and when to take each.   Vitamin D (Ergocalciferol) 50000 units Caps capsule Commonly known as:  DRISDOL Take 50,000 Units by mouth every Friday.        No Known Allergies  Consultations:  ID   Procedures/Studies: Dg Chest 1 View  Result Date: 11/06/2015 CLINICAL DATA:  Altered mental status. EXAM: CHEST 1 VIEW COMPARISON:  September 29, 2015 FINDINGS: The heart size and mediastinal contours are within normal limits. Both lungs are clear. The visualized skeletal structures are unremarkable. IMPRESSION: No acute abnormalities. Electronically Signed   By: Gerome Sam III M.D   On: 11/06/2015 22:20   Ct Head Wo Contrast  Result Date: 11/07/2015 CLINICAL DATA:  Altered mental status.  Nausea and vomiting. EXAM: CT HEAD WITHOUT CONTRAST TECHNIQUE: Contiguous axial images were obtained from the base of the skull through the vertex without intravenous contrast. COMPARISON:  None. FINDINGS: There is mild debris in the sphenoid sinuses. Paranasal sinuses, mastoid air cells, and middle ears are otherwise unremarkable. No acute bony abnormalities. Extracranial soft tissues are within normal limits. No subdural, epidural, or subarachnoid hemorrhage. The ventricles and sulci are unremarkable for age. The cerebellum, brainstem, and basal cisterns are normal. No acute cortical ischemia or infarct. No mass, mass effect, or midline shift. IMPRESSION: No acute intracranial process. Electronically Signed   By: Gerome Sam III M.D   On: 11/07/2015 01:43   Ct Chest W Contrast  Result Date: 11/15/2015 CLINICAL DATA:  Recent diagnosis of HIV. Recently treated for pneumonia. Altered mental status. Generalized weakness. EXAM: CT CHEST WITH CONTRAST TECHNIQUE: Multidetector CT imaging of the chest was performed during intravenous contrast administration. CONTRAST:  75mL ISOVUE-300 IOPAMIDOL (ISOVUE-300) INJECTION 61% COMPARISON:  Chest CT -10/24/2015; chest radiograph - 11/11/2015 FINDINGS: Cardiovascular: Normal heart size. Coronary artery calcifications. No pericardial effusion. Although this examination was not tailored for the evaluation the pulmonary arteries, there are no discrete filling defects within the central pulmonary arterial tree to suggest central pulmonary embolism. Normal caliber of the main pulmonary artery. Normal caliber the thoracic  aorta. Calcified atherosclerotic plaque within the aortic arch. Bovine configuration of the aortic arch. Mediastinum/Nodes: Scattered mediastinal lymph nodes are individually not enlarged by size criteria with index precarinal lymph node measuring 0.7 cm in greatest short axis diameter. No bulky mediastinal, hilar axillary lymphadenopathy. Minimal amount of ill-defined tissue within the anterior mediastinum is favored to represent residual thymic tissue. Lungs/Pleura: Overall improved aeration of lungs with resolution of the majority of bilateral tree-in-bud nodular opacities. Residual ill-defined nodular opacities are seen within the bilateral lower lobes, right greater than left. No new focal airspace opacities. No air bronchograms. No pleural effusion or pneumothorax. The central pulmonary airways appear widely patent. Minimal dependent subpleural ground-glass atelectasis. Upper Abdomen: Limited evaluation of the upper abdomen is unremarkable. Musculoskeletal: No acute or aggressive osseous abnormalities. Regional soft tissues appear normal. Normal appearance of the thyroid gland. IMPRESSION: 1. Improved aeration of the lungs with minimal residual ill-defined tree-in-bud opacities with the bilateral lung bases, right greater than left, favored to represent residual though much improved atypical infection. No new or worsening focal airspace  opacities. 2. Coronary artery calcifications. Aortic Atherosclerosis (ICD10-170.0) Electronically Signed   By: Simonne Come M.D.   On: 11/15/2015 02:06  Dg Chest Port 1 View  Result Date: 11/17/2015 CLINICAL DATA:  55 year old female with fever.  HIV pneumonia. EXAM: PORTABLE CHEST 1 VIEW COMPARISON:  Chest CT dated 11/15/2015 FINDINGS: Minimal bibasilar hazy airspace densities may represent atelectatic changes or residual airspace infiltrate from prior study. There is no focal consolidation, pleural effusion, or pneumothorax. The cardiac silhouette is within normal limits  with no acute osseous pathology. IMPRESSION: Minimal bibasilar hazy densities.  No focal consolidation. Electronically Signed   By: Elgie Collard M.D.   On: 11/17/2015 00:45  Dg Chest Port 1 View  Result Date: 11/11/2015 CLINICAL DATA:  Fever. EXAM: PORTABLE CHEST 1 VIEW COMPARISON:  Chest x-ray dated 11/06/2015. FINDINGS: Study is hypoinspiratory with crowding of the perihilar and bibasilar bronchovascular markings. New subtle opacity is seen at the left lung base overlying the left heart shadow, suspicious for pneumonia. Lungs appear otherwise clear. Heart size is normal. Atherosclerotic changes noted at the aortic arch. Osseous structures about the chest are unremarkable. IMPRESSION: Low lung volumes. Subtle opacity at the left lung base, suspicious for pneumonia. Aortic atherosclerosis. Electronically Signed   By: Bary Richard M.D.   On: 11/11/2015 21:19   Dg Swallowing Func-speech Pathology  Result Date: 11/10/2015 Objective Swallowing Evaluation: Type of Study: MBS-Modified Barium Swallow Study Patient Details Name: Samantha Kline MRN: [id number] Date of Birth: [birth year] Today's Date: 11/10/2015 Time: SLP Start Time (ACUTE ONLY): 1340-SLP Stop Time (ACUTE ONLY): 1400 SLP Time Calculation (min) (ACUTE ONLY): 20 min Past Medical History: Past Medical History Diagnosis Date . Anemia  . Vitamin D deficiency  . Pneumonia 10/24/2015 . HIV (human immunodeficiency virus infection) (HCC)    11/06/15 Family states it was just diagnosed this week Past Surgical History: Past Surgical History Procedure Laterality Date . Tubal ligation  1980s HPI: Destine Taketa is a 55 y.o. female with medical history significant of recently diagnosed HIV, recently treated pneumonia, who presents with altered mental status, generalized weakness, nausea and vomiting.Head CT and CXR negative. 50 lb unintentional weight loss over the past 5 - 6 months along with decreased appetite. No Data Recorded Assessment / Plan / Recommendation CHL  IP CLINICAL IMPRESSIONS 11/10/2015 Therapy Diagnosis Mild to moderate oral phase dysphagia Clinical Impression Pt demonstrates a mild/moderate oral dysphagia secondary to altered mental status. Upon arrival to [facility] suite pt was orally holding thick pink secretions which were suctioned from her mouth. Pt orally held liquid and puree boluses, requiring suction in 2/4 trials to remove the bolus. SHe initaited swallowing with hand over hand cup sips with no penetration seen, though no coughing was observed during this assessment. Pt coughed after each sip in the room. Pt has potential for aspiration and is also not meeting caloric needs due to AMS. Overall, pt appears profoundly weak and altered and is hot to the touch. SLP notified RN. Recommend pt consume pureed solids and thin liquids only when alert. Suction should be in the room. May need consideration for temporary alternative means of nutrition if intake and arousal do nit improve. Will follow for tolerance and upgrade when possible.  Impact on safety and function Moderate aspiration risk;Risk for inadequate nutrition/hydration   CHL IP TREATMENT RECOMMENDATION 11/07/2015 Treatment Recommendations Therapy as outlined in treatment plan below   Prognosis 11/10/2015 Prognosis for Safe Diet Advancement Fair Barriers to Reach Goals Cognitive deficits Barriers/Prognosis Comment -- CHL IP DIET RECOMMENDATION  11/10/2015 SLP Diet Recommendations Dysphagia 1 (Puree) solids;Thin liquid Liquid Administration via Cup;Straw Medication Administration Whole meds with puree Compensations Slow rate;Small sips/bites Postural Changes Seated upright at 90 degrees;Remain semi-upright after after feeds/meals (Comment)   CHL IP OTHER RECOMMENDATIONS 11/10/2015 Recommended Consults -- Oral Care Recommendations -- Other Recommendations Have oral suction available   CHL IP FOLLOW UP RECOMMENDATIONS 11/10/2015 Follow up Recommendations Inpatient Rehab   CHL IP FREQUENCY AND DURATION 11/10/2015  Speech Therapy Frequency (ACUTE ONLY) min 2x/week Treatment Duration 2 weeks      CHL IP ORAL PHASE 11/10/2015 Oral Phase Impaired Oral - Pudding Teaspoon -- Oral - Pudding Cup -- Oral - Honey Teaspoon -- Oral - Honey Cup -- Oral - Nectar Teaspoon -- Oral - Nectar Cup -- Oral - Nectar Straw -- Oral - Thin Teaspoon -- Oral - Thin Cup Holding of bolus;Lingual pumping;Piecemeal swallowing;Delayed oral transit Oral - Thin Straw Holding of bolus;Lingual pumping;Piecemeal swallowing;Delayed oral transit Oral - Puree Holding of bolus;Lingual pumping;Piecemeal swallowing;Delayed oral transit Oral - Mech Soft -- Oral - Regular -- Oral - Multi-Consistency -- Oral - Pill -- Oral Phase - Comment --  CHL IP PHARYNGEAL PHASE 11/10/2015 Pharyngeal Phase WFL Pharyngeal- Pudding Teaspoon -- Pharyngeal -- Pharyngeal- Pudding Cup -- Pharyngeal -- Pharyngeal- Honey Teaspoon -- Pharyngeal -- Pharyngeal- Honey Cup -- Pharyngeal -- Pharyngeal- Nectar Teaspoon -- Pharyngeal -- Pharyngeal- Nectar Cup -- Pharyngeal -- Pharyngeal- Nectar Straw -- Pharyngeal -- Pharyngeal- Thin Teaspoon -- Pharyngeal -- Pharyngeal- Thin Cup -- Pharyngeal -- Pharyngeal- Thin Straw -- Pharyngeal -- Pharyngeal- Puree -- Pharyngeal -- Pharyngeal- Mechanical Soft -- Pharyngeal -- Pharyngeal- Regular -- Pharyngeal -- Pharyngeal- Multi-consistency -- Pharyngeal -- Pharyngeal- Pill -- Pharyngeal -- Pharyngeal Comment --  CHL IP CERVICAL ESOPHAGEAL PHASE 11/10/2015 Cervical Esophageal Phase WFL Pudding Teaspoon -- Pudding Cup -- Honey Teaspoon -- Honey Cup -- Nectar Teaspoon -- Nectar Cup -- Nectar Straw -- Thin Teaspoon -- Thin Cup -- Thin Straw -- Puree -- Mechanical Soft -- Regular -- Multi-consistency -- Pill -- Cervical Esophageal Comment -- No flowsheet data found. Harlon Ditty, MA CCC-SLP [phone] [place], [name] 11/10/2015, 2:08 PM                Subjective: Her spirit is better, she was able to smile today.  She denies pain.   Discharge  Exam: Vitals:   11/24/15 2123 11/25/15 0457  BP: 112/77 126/81  Pulse: 95 99  Resp: 18 18  Temp: 97.9 F (36.6 C) 98.2 F (36.8 C)   Vitals:   11/24/15 0637 11/24/15 1445 11/24/15 2123 11/25/15 0457  BP: 118/74 107/72 112/77 126/81  Pulse: (!) 106 (!) 118 95 99  Resp:  17 18 18   Temp: 99.2 F (37.3 C) 98 F (36.7 C) 97.9 F (36.6 C) 98.2 F (36.8 C)  TempSrc: Oral Oral Oral Oral  SpO2: 98% 100% 99% 100%  Weight:      Height:        General: Pt is alert, awake, not in acute distress Cardiovascular: RRR, S1/S2 +, no rubs, no gallops Respiratory: CTA bilaterally, no wheezing, no rhonchi Abdominal: Soft, NT, ND, bowel sounds + Extremities: no edema, no cyanosis    The results of significant diagnostics from this hospitalization (including imaging, microbiology, ancillary and laboratory) are listed below for reference.     Microbiology: Recent Results (from the past 240 hour(s))  Culture, blood (routine x 2)     Status: None   Collection Time: 11/17/15  1:10 AM  Result Value Ref  Range Status   Specimen Description BLOOD RIGHT ANTECUBITAL  Final   Special Requests BOTTLES DRAWN AEROBIC AND ANAEROBIC 10CC,5CC  Final   Culture NO GROWTH 5 DAYS  Final   Report Status 11/22/2015 FINAL  Final  Culture, blood (routine x 2)     Status: None   Collection Time: 11/17/15  1:23 AM  Result Value Ref Range Status   Specimen Description BLOOD RIGHT HAND  Final   Special Requests IN PEDIATRIC BOTTLE 3CC  Final   Culture NO GROWTH 5 DAYS  Final   Report Status 11/22/2015 FINAL  Final     Labs: BNP (last 3 results)  Recent Labs  08/09/15 2320 10/24/15 0310  BNP 12.2 45.8   Basic Metabolic Panel:  Recent Labs Lab 11/19/15 0444 11/20/15 1206 11/21/15 0800 11/23/15 0928 11/24/15 0454  NA 129* 131* 132* 135 132*  K 2.8* 2.6* 3.6 3.0* 3.7  CL 101 104 108 109 103  CO2 22 22 22 22 23   GLUCOSE 99 125* 119* 210* 99  BUN 11 13 11 8 7   CREATININE 0.81 0.90 0.67 0.71 0.61   CALCIUM 7.1* 7.1* 7.1* 6.9* 7.3*  MG  --   --  2.0  --  1.7   Liver Function Tests:  Recent Labs Lab 11/19/15 1306  AST 30  ALT 14  ALKPHOS 50  BILITOT 0.6  PROT 4.2*  ALBUMIN 1.4*   No results for input(s): LIPASE, AMYLASE in the last 168 hours. No results for input(s): AMMONIA in the last 168 hours. CBC:  Recent Labs Lab 11/24/15 0454  WBC 10.3  HGB 9.3*  HCT 28.8*  MCV 93.5  PLT 195   Cardiac Enzymes: No results for input(s): CKTOTAL, CKMB, CKMBINDEX, TROPONINI in the last 168 hours. BNP: Invalid input(s): POCBNP CBG: No results for input(s): GLUCAP in the last 168 hours. D-Dimer No results for input(s): DDIMER in the last 72 hours. Hgb A1c No results for input(s): HGBA1C in the last 72 hours. Lipid Profile No results for input(s): CHOL, HDL, LDLCALC, TRIG, CHOLHDL, LDLDIRECT in the last 72 hours. Thyroid function studies No results for input(s): TSH, T4TOTAL, T3FREE, THYROIDAB in the last 72 hours.  Invalid input(s): FREET3 Anemia work up No results for input(s): VITAMINB12, FOLATE, FERRITIN, TIBC, IRON, RETICCTPCT in the last 72 hours. Urinalysis    Component Value Date/Time   COLORURINE YELLOW 11/06/2015 2236   APPEARANCEUR CLEAR 11/06/2015 2236   LABSPEC 1.029 11/06/2015 2236   PHURINE 6.0 11/06/2015 2236   GLUCOSEU NEGATIVE 11/06/2015 2236   HGBUR NEGATIVE 11/06/2015 2236   BILIRUBINUR NEGATIVE 11/06/2015 2236   KETONESUR NEGATIVE 11/06/2015 2236   PROTEINUR 30 (A) 11/06/2015 2236   NITRITE NEGATIVE 11/06/2015 2236   LEUKOCYTESUR NEGATIVE 11/06/2015 2236   Sepsis Labs Invalid input(s): PROCALCITONIN,  WBC,  LACTICIDVEN Microbiology Recent Results (from the past 240 hour(s))  Culture, blood (routine x 2)     Status: None   Collection Time: 11/17/15  1:10 AM  Result Value Ref Range Status   Specimen Description BLOOD RIGHT ANTECUBITAL  Final   Special Requests BOTTLES DRAWN AEROBIC AND ANAEROBIC 10CC,5CC  Final   Culture NO GROWTH 5 DAYS   Final   Report Status 11/22/2015 FINAL  Final  Culture, blood (routine x 2)     Status: None   Collection Time: 11/17/15  1:23 AM  Result Value Ref Range Status   Specimen Description BLOOD RIGHT HAND  Final   Special Requests IN PEDIATRIC BOTTLE 3CC  Final  Culture NO GROWTH 5 DAYS  Final   Report Status 11/22/2015 FINAL  Final     Time coordinating discharge: Over 30 minutes  SIGNED:   Alba Cory, MD  [facility] 11/25/2015, 10:58 AM Pager [phone]  If 7PM-7AM, please contact night-coverage [email or web address] Password [email or web address]

## 2015-11-25 NOTE — H&P (View-Only) (Signed)
Physical Medicine and Rehabilitation Admission H&P    Chief Complaint  Patient presents with  . Debility with Encephalopathy/newly diagnosed HIV      HPI: Samantha Kline is a 55 y.o. female with unintentional 50 lbs weight loss over past few months, anorexia, SOB with progressive weakness. She was admitted 07/03-7/10/17 with fever due to PCP PNA and diagnosed with HIV. She was discharged to home on antiviral therapy but continued to have malaise, N/V with poor oral intake and inability to walk. She was readmitted on 07/17 with lethargy, confusion and weakness due to hyponatremia with Na- 110.  She was treated with fluid boluses and kayexalate for management of hyperkalemia.  CT head negative for infection or acute changes. Mentation improving and she has had improvement in electrolyte abnormality but decrease in ability to eat due to dysphagia. She was started on IV flucanazole for candida esophagitis.  She spiked temp of 103.4 on 07/29 and was started on empiric treatment for MAC with resolution of low grade fevers.  She has had decrease in viral load from 200K to 200 with increase in white count and concerns about risk of IRIS.  She underwent endoscopy (Dr. Watt Climes)  with biopsies to rule out CMV/HSV on 8/02 due to continued odynophagia. EGD showed ulcers and high CMV viral load.   Pancytopenia treated Granix and she was started on Gancyclovir due to high likelihood of CMV esophagitis. She has had improvement in WBC from 1.6-->10.3 and platelets from 115-->195.  She continues to have poor po intake but has refused feeding tube or PEG.  Hyponatremia improving with electrolyte abnormalities being treated as needed. She continues to be  Debilitated but showing increase in voice volume and stabilization of medical status.  Therapy ongoing and CIR recommended for follow up therapy.    Review of Systems  Constitutional: Positive for malaise/fatigue and weight loss.  HENT: Negative for hearing loss.     Eyes: Negative for blurred vision and double vision.  Respiratory: Negative for cough and shortness of breath.   Cardiovascular: Negative for chest pain and palpitations.  Gastrointestinal: Negative for abdominal pain, heartburn and nausea.  Genitourinary: Negative for dysuria.  Musculoskeletal: Negative for back pain and myalgias.  Neurological: Positive for weakness. Negative for dizziness and headaches.  Psychiatric/Behavioral: Positive for hallucinations and memory loss.      Past Medical History:  Diagnosis Date  . Anemia   . HIV (human immunodeficiency virus infection) ([place])    11/06/15 Family states it was just diagnosed this week  . Pneumonia 10/24/2015  . Vitamin D deficiency     Past Surgical History:  Procedure Laterality Date  . ESOPHAGOGASTRODUODENOSCOPY N/A 11/23/2015   Procedure: ESOPHAGOGASTRODUODENOSCOPY (EGD);  Surgeon: Clarene Essex, MD;  Location[facility] ENDOSCOPY;  Service: Endoscopy;  Laterality: N/A;  . TUBAL LIGATION  1980s    Family History  Problem Relation Age of Onset  . Diabetes Father   . Heart attack Brother   . Hypertension Sister     Social History:  Married.  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  She reports that she quit smoking in 1980's.  She has never used smokeless tobacco. She reports that she drinks alcohol. She reports that she does not use illicit drugs.    Allergies: No Known Allergies    Medications Prior to Admission  Medication Sig Dispense Refill  . azithromycin (ZITHROMAX) 600 MG tablet Take 2 tablets (1,200 mg total) by mouth once a week. (Patient taking differently:  Take 1,200 mg by mouth every Friday. ) 4 tablet 6  . dolutegravir (TIVICAY) 50 MG tablet Take 1 tablet (50 mg total) by mouth daily. (Patient taking differently: Take 50 mg by mouth daily at 12 noon. ) 30 tablet 3  . emtricitabine-tenofovir AF (DESCOVY) 200-25 MG tablet Take 1 tablet by mouth daily. (Patient taking differently: Take 1 tablet by  mouth daily at 12 noon. ) 30 tablet 3  . ferrous sulfate 325 (65 FE) MG tablet Take 325 mg by mouth daily with breakfast.    . fluconazole (DIFLUCAN) 100 MG tablet Take 1 tablet (100 mg total) by mouth once a week. (Patient taking differently: Take 100 mg by mouth every Friday. ) 4 tablet 6  . predniSONE (DELTASONE) 20 MG tablet Take 40 mg PO q daily for 5 days, followed by 30 mg PO q daily for 11 days. Qty Sufficient (Patient taking differently: Take 30-40 mg by mouth daily at 12 noon. Take 2 tablets (40 mg) by mouth daily at noon for 5 days, then take 1 1/2 tablets (30 mg) daily for 11 days, then stop) 40 tablet 0  . sulfamethoxazole-trimethoprim (BACTRIM DS,SEPTRA DS) 800-160 MG tablet 2 tabs PO TID x 16 days, then 1 tab PO q daily thereafter Qty Sufficient (Patient taking differently: Take 1-23 tablets by mouth See admin instructions. Take 2 tablets by mouth 3 times daily for 16 days, then take 1 tablet daily.) 36 tablet 0  . vitamin B-12 (CYANOCOBALAMIN) 100 MCG tablet Take 100 mcg by mouth daily.    . Vitamin D, Ergocalciferol, (DRISDOL) 50000 units CAPS capsule Take 50,000 Units by mouth every Friday.       Home: Home Living Family/patient expects to be discharged to:: Inpatient rehab Living Arrangements: Spouse/significant other Available Help at Discharge:  (spouse states he will talk to family about help providing 24) Type of Home: House Home Access: Stairs to enter [employer] of Steps: 2 Entrance Stairs-Rails: None Home Layout: Multi-level Alternate Level Stairs-Number of Steps: 5 up and 5 down Alternate Level Stairs-Rails: Left Bathroom Shower/Tub: Engineer, manufacturing systems: Standard Bathroom Accessibility: Yes Home Equipment: Bedside commode, Environmental consultant - 2 wheels  Lives With: Spouse   Functional History: Prior Function Level of Independence: Needs assistance Gait / Transfers Assistance Needed: walked 100 ft minguard with RW on recent admission; at home  walked with minguard assist, no device, no furniture walking ADL's / Homemaking Assistance Needed: Pt indicates spouse was assisting her with ADLs PTA  Comments: spouse had taken off of work since last hospital d/c to provide care  Functional Status:  Mobility: Bed Mobility Overal bed mobility: Needs Assistance Bed Mobility: Supine to Sit Rolling: Mod assist Sidelying to sit: Mod assist, +2 for physical assistance Supine to sit: Max assist, +2 for physical assistance Sit to supine: Total assist, +2 for physical assistance Sit to sidelying: Max assist, +2 for physical assistance, +2 for safety/equipment General bed mobility comments: Pt OOB in chair upon arrival. Transfers Overall transfer level: Needs assistance Equipment used: Rolling walker (2 wheeled) Transfers: Sit to/from Stand, Stand Pivot Transfers Sit to Stand: +2 physical assistance, Min assist Stand pivot transfers: Mod assist, +2 safety/equipment General transfer comment: Not assessed at this time. Ambulation/Gait Ambulation/Gait assistance: Min assist, Mod assist, +2 safety/equipment Ambulation Distance (Feet): 40 Feet Assistive device: Rolling walker (2 wheeled) Gait Pattern/deviations: Step-through pattern, Decreased stride length, Trunk flexed General Gait Details: deferred due to tachycardia and fatigue Gait velocity: very slow pattern    ADL: ADL  Overall ADL's : Needs assistance/impaired Eating/Feeding: Supervision/ safety Eating/Feeding Details (indicate cue type and reason): Pt has difficulty bringing food to mouth due to weakness Grooming: Minimal assistance, Oral care, Wash/dry face, Sitting Grooming Details (indicate cue type and reason): Assist for cap back on toothpaste and set up for all supplies. Pt quick to fatigue. Upper Body Bathing: Sitting, Total assistance Upper Body Bathing Details (indicate cue type and reason): max assist to maintain dynamic sitting EOB Lower Body Bathing: Maximal assistance,  +2 for physical assistance, Bed level Upper Body Dressing : Total assistance, Sitting Upper Body Dressing Details (indicate cue type and reason): max assist to maintain dynamic sitting EOB  Lower Body Dressing: Maximal assistance, +2 for physical assistance, Bed level Toilet Transfer: Moderate assistance, +2 for physical assistance, +2 for safety/equipment Toilet Transfer Details (indicate cue type and reason): simulated EOB to recliner transfer  Orange and Hygiene: Total assistance, Sit to/from stand Toileting - Clothing Manipulation Details (indicate cue type and reason): Pt incontinenet if BM Functional mobility during ADLs: Maximal assistance General ADL Comments: Pt fatigues very quickly with functional activities. Pt noted to have flexed posture of wrists and digits; pt able to actively come out of posture with verbal cues. Encouraged proper positioning of bil UEs.   Cognition: Cognition Overall Cognitive Status: Impaired/Different from baseline Orientation Level: Oriented X4 Cognition Arousal/Alertness: Awake/alert Behavior During Therapy: Flat affect Overall Cognitive Status: Impaired/Different from baseline Area of Impairment: Following commands, Problem solving Orientation Level: Disoriented to, Time, Place Current Attention Level: Selective Memory: Decreased short-term memory Following Commands: Follows one step commands with increased time, Follows one step commands consistently Safety/Judgement: Decreased awareness of safety Awareness: Anticipatory Problem Solving: Slow processing General Comments: Increased time requried for processing  Difficult to assess due to: Level of arousal   Blood pressure 126/81, pulse 99, temperature 98.2 F (36.8 C), temperature source Oral, resp. rate 18, height '5\' 4"'$  (1.626 m), weight 60.6 kg (133 lb 11.2 oz), SpO2 100 %. Physical Exam  Nursing note and vitals reviewed. Constitutional: Vital signs are normal. She  appears listless. She appears cachectic. She has a sickly appearance.  Sunken cheeks with stiff face with fusion of edges of lips and decreased ROM.   HENT:  Head: Normocephalic and atraumatic.  Mouth/Throat: Oral lesions present.  Eyes: Conjunctivae are normal. Pupils are equal, round, and reactive to light. No scleral icterus.  Neck: Neck supple.  Decreased ROM  Cardiovascular: Regular rhythm.  Tachycardia present.   No murmur heard. HR 120 at rest  Respiratory: Effort normal and breath sounds normal. No stridor. No respiratory distress. She has no wheezes.  GI: Soft. Bowel sounds are normal. She exhibits no distension. There is no tenderness.  Musculoskeletal: She exhibits no edema or tenderness.  Muscle wasting all limbs with decreased ROM.  Neurological: She appears listless.  Oriented to self and place. Situation "sickness".  Disoriented and hallucinating? (asked about daughter but no visitor today per nursing). Able to follow simple motor commands.  UE motor: 3/5 deltoid, biceps, triceps, 3+ wrist and HI. LE: 2/5 HF, 2+ KE/KF and 3/5 ADF/PF. Senses pinch in all 4's.   Skin: Skin is warm and dry. No rash noted. No erythema.  Psychiatric: Her affect is inappropriate. Her speech is delayed. She is slowed and withdrawn. Cognition and memory are impaired.    Results for orders placed or performed during the hospital encounter of 11/06/15 (from the past 48 hour(s))  Basic metabolic panel     Status: Abnormal  Collection Time: 11/24/15  4:54 AM  Result Value Ref Range   Sodium 132 (L) 135 - 145 mmol/L   Potassium 3.7 3.5 - 5.1 mmol/L    Comment: DELTA CHECK NOTED   Chloride 103 101 - 111 mmol/L   CO2 23 22 - 32 mmol/L   Glucose, Bld 99 65 - 99 mg/dL   BUN 7 6 - 20 mg/dL   Creatinine, Ser 0.61 0.44 - 1.00 mg/dL   Calcium 7.3 (L) 8.9 - 10.3 mg/dL   GFR calc non Af Amer >60 >60 mL/min   GFR calc Af Amer >60 >60 mL/min    Comment: (NOTE) The eGFR has been calculated using the CKD  EPI equation. This calculation has not been validated in all clinical situations. eGFR's persistently <60 mL/min signify possible Chronic Kidney Disease.    Anion gap 6 5 - 15  CBC     Status: Abnormal   Collection Time: 11/24/15  4:54 AM  Result Value Ref Range   WBC 10.3 4.0 - 10.5 K/uL   RBC 3.08 (L) 3.87 - 5.11 MIL/uL   Hemoglobin 9.3 (L) 12.0 - 15.0 g/dL   HCT 28.8 (L) 36.0 - 46.0 %   MCV 93.5 78.0 - 100.0 fL   MCH 30.2 26.0 - 34.0 pg   MCHC 32.3 30.0 - 36.0 g/dL   RDW 19.0 (H) 11.5 - 15.5 %   Platelets 195 150 - 400 K/uL  Magnesium     Status: None   Collection Time: 11/24/15  4:54 AM  Result Value Ref Range   Magnesium 1.7 1.7 - 2.4 mg/dL   No results found.     Medical Problem List and Plan: 1.  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  DVT Prophylaxis/Anticoagulation: Mechanical: Sequential compression devices, below knee Bilateral lower extremities 3. Pain Management: Tylenol prn 4. Mood: LCSW to follow for evaluation and support.  5. Neuropsych: This patient is not fully capable of making decisions on her own behalf. 6. Skin/Wound Care: Will order air mattress overlay due to malnutrition, poor intake and stage 2 sacral ulcer.   -is at big risk for further breakdown 7. Fluids/Electrolytes/Nutrition:  Continue dextrose for now.  8. HIV/AIDS with MAC?:  On descovy, ticovey, biaxin and ethambutol.   -follow up per ID   9. Candida/CMV esophagitis: IV diflucan since 7/21 and On Ganciclovir since 08/03 10. FTT: Continue IVF for now. Will start calorie count and check prealbumin. Will need routine check of lytes due to risk of refeeding syndrome.  11. Pancytopenia:  Will check labs in am and next Tues--as anticipate drop in numbers 5-7 days after Granix 12. Hyponatremia/Hypokalemia: Monitor lytes with Mg and Phos levels every 3-4 days.  13. PCP prophylaxis: Bactrim discontinued due to hyponatremia. Has  been treated for PCP infection and dapsone resumed 8/4  for prophylaxis.    Post Admission Physician Evaluation: 1. Functional deficits secondary  to HIV encephalopathy. 2. Patient is admitted to receive collaborative, interdisciplinary care between the physiatrist, rehab nursing staff, and therapy team. 3. Patient's level of medical complexity and substantial therapy needs in context of that medical necessity cannot be provided at a lesser intensity of care such as a SNF. 4. Patient has experienced substantial functional loss from his/her baseline which was documented above under the "Functional History" and "Functional Status" headings.  Judging by the patient's diagnosis, physical exam, and functional history, the patient has potential for functional progress which will result  in measurable gains while on inpatient rehab.  These gains will be of substantial and practical use upon discharge  in facilitating mobility and self-care at the household level. 5. Physiatrist will provide 24 hour management of medical needs as well as oversight of the therapy plan/treatment and provide guidance as appropriate regarding the interaction of the two. 6. 24 hour rehab nursing will assist with bladder management, bowel management, safety, skin/wound care, disease management, medication administration, pain management and patient education  and help integrate therapy concepts, techniques,education, etc. 7. PT will assess and treat for/with: Lower extremity strength, range of motion, stamina, balance, functional mobility, safety, adaptive techniques and equipment, cognitive perceptual rx, activity tolerance, family ed.   Goals are: supervision to min assist. 8. OT will assess and treat for/with: ADL's, functional mobility, safety, upper extremity strength, adaptive techniques and equipment, NMR, cognitive perceptual rx, family education, ego support.   Goals are: supervision to min assist. Therapy may  proceed with  showering this patient. 9. SLP will assess and treat for/with: cognition, communication.   Goals are: mod I. 10. Case Management and Social Worker will assess and treat for psychological issues and discharge planning. 11. Team conference will be held weekly to assess progress toward goals and to determine barriers to discharge. 12. Patient will receive at least 3 hours of therapy per day at least 5 days per week. 13. ELOS: 16-22 days       14. Prognosis:  good     Meredith Staggers, MD, [place] 11/25/2015  11/25/2015

## 2015-11-25 NOTE — Progress Notes (Signed)
Rehab admissions - I have approval for acute inpatient rehab admission for today.  If medically ready, we can admit to acute inpatient rehab today.  Call me for questions.  #[phone]

## 2015-11-25 NOTE — Progress Notes (Signed)
Received pt. As a transfer from 5 W.Pt. Was oriented to unit routine and protocol.Safety video was presented.Safety plan was explained.Fall prevention plan was explained.

## 2015-11-25 NOTE — Progress Notes (Signed)
Discussed case with pathology believes biopsies are probably more compatible with CMV although with significant ulceration definitive diagnosis difficult by per Dr. Blair Dolphin note it appears patient is getting better on treatment for that so it makes sense to continue and viral cultures are pending as well and Dr. Bosie Clos is on call this weekend and please call him if we could be of any further assistance with this hospital stay

## 2015-11-25 NOTE — Interval H&P Note (Signed)
Samantha Kline was admitted today to Inpatient Rehabilitation with the diagnosis of debility.  The patient's history has been reviewed, patient examined, and there is no change in status.  Patient continues to be appropriate for intensive inpatient rehabilitation.  I have reviewed the patient's chart and labs.  Questions were answered to the patient's satisfaction. The PAPE has been reviewed and assessment remains appropriate.  Samantha Kline T 11/25/2015, 8:05 PM

## 2015-11-26 ENCOUNTER — Inpatient Hospital Stay (HOSPITAL_COMMUNITY): Payer: Managed Care, Other (non HMO) | Admitting: Speech Pathology

## 2015-11-26 ENCOUNTER — Inpatient Hospital Stay (HOSPITAL_COMMUNITY): Payer: Managed Care, Other (non HMO) | Admitting: Occupational Therapy

## 2015-11-26 ENCOUNTER — Inpatient Hospital Stay (HOSPITAL_COMMUNITY): Payer: Managed Care, Other (non HMO) | Admitting: Physical Therapy

## 2015-11-26 DIAGNOSIS — D521 Drug-induced folate deficiency anemia: Secondary | ICD-10-CM

## 2015-11-26 DIAGNOSIS — G9340 Encephalopathy, unspecified: Secondary | ICD-10-CM

## 2015-11-26 DIAGNOSIS — E871 Hypo-osmolality and hyponatremia: Secondary | ICD-10-CM

## 2015-11-26 DIAGNOSIS — I10 Essential (primary) hypertension: Secondary | ICD-10-CM

## 2015-11-26 DIAGNOSIS — R1310 Dysphagia, unspecified: Secondary | ICD-10-CM

## 2015-11-26 DIAGNOSIS — G9349 Other encephalopathy: Secondary | ICD-10-CM

## 2015-11-26 DIAGNOSIS — D61810 Antineoplastic chemotherapy induced pancytopenia: Secondary | ICD-10-CM

## 2015-11-26 DIAGNOSIS — E876 Hypokalemia: Secondary | ICD-10-CM

## 2015-11-26 DIAGNOSIS — B3781 Candidal esophagitis: Secondary | ICD-10-CM

## 2015-11-26 DIAGNOSIS — R627 Adult failure to thrive: Secondary | ICD-10-CM

## 2015-11-26 DIAGNOSIS — T451X5A Adverse effect of antineoplastic and immunosuppressive drugs, initial encounter: Secondary | ICD-10-CM

## 2015-11-26 DIAGNOSIS — B20 Human immunodeficiency virus [HIV] disease: Secondary | ICD-10-CM

## 2015-11-26 LAB — CBC WITH DIFFERENTIAL/PLATELET
BASOS ABS: 0 10*3/uL (ref 0.0–0.1)
BASOS PCT: 0 %
EOS ABS: 0.1 10*3/uL (ref 0.0–0.7)
EOS PCT: 1 %
HCT: 29.1 % — ABNORMAL LOW (ref 36.0–46.0)
Hemoglobin: 9.5 g/dL — ABNORMAL LOW (ref 12.0–15.0)
Lymphocytes Relative: 6 %
Lymphs Abs: 0.9 10*3/uL (ref 0.7–4.0)
MCH: 31 pg (ref 26.0–34.0)
MCHC: 32.6 g/dL (ref 30.0–36.0)
MCV: 95.1 fL (ref 78.0–100.0)
MONO ABS: 0.6 10*3/uL (ref 0.1–1.0)
Monocytes Relative: 4 %
Neutro Abs: 12.9 10*3/uL — ABNORMAL HIGH (ref 1.7–7.7)
Neutrophils Relative %: 89 %
PLATELETS: 193 10*3/uL (ref 150–400)
RBC: 3.06 MIL/uL — ABNORMAL LOW (ref 3.87–5.11)
RDW: 18.9 % — AB (ref 11.5–15.5)
WBC: 14.4 10*3/uL — ABNORMAL HIGH (ref 4.0–10.5)

## 2015-11-26 LAB — COMPREHENSIVE METABOLIC PANEL
ALBUMIN: 1.3 g/dL — AB (ref 3.5–5.0)
ALT: 12 U/L — ABNORMAL LOW (ref 14–54)
AST: 25 U/L (ref 15–41)
Alkaline Phosphatase: 93 U/L (ref 38–126)
Anion gap: 1 — ABNORMAL LOW (ref 5–15)
BUN: 9 mg/dL (ref 6–20)
CHLORIDE: 109 mmol/L (ref 101–111)
CO2: 23 mmol/L (ref 22–32)
Calcium: 7.1 mg/dL — ABNORMAL LOW (ref 8.9–10.3)
Creatinine, Ser: 0.78 mg/dL (ref 0.44–1.00)
GFR calc Af Amer: >60 mL/min (ref >60)
Glucose, Bld: 126 mg/dL — ABNORMAL HIGH (ref 65–99)
POTASSIUM: 3 mmol/L — AB (ref 3.5–5.1)
Sodium: 133 mmol/L — ABNORMAL LOW (ref 135–145)
Total Bilirubin: 0.3 mg/dL (ref 0.3–1.2)
Total Protein: 5 g/dL — ABNORMAL LOW (ref 6.5–8.1)

## 2015-11-26 NOTE — Evaluation (Signed)
Physical Therapy Assessment and Plan  Patient Details  Name: Samantha Kline MRN: [id number] Date of Birth: [birth year]  PT Diagnosis: Abnormal posture, Cognitive deficits, Difficulty walking and Muscle weakness Rehab Potential: Fair ELOS: 3-4 weeks   Today's Date: 11/26/2015 PT Individual Time: 1300-1415 PT Individual Time Calculation (min): 75 min     Problem List:  Patient Active Problem List   Diagnosis Date Noted  . HIV encephalopathy (HCC)   . Benign essential HTN   . Dysphagia   . Antineoplastic chemotherapy induced pancytopenia (HCC)   . Drug-induced folate deficiency anemia   . Physical debility 11/25/2015  . Esophageal ulcer   . Malnutrition of moderate degree 11/22/2015  . Anemia due to bone marrow failure (HCC)   . Cytomegalovirus (CMV) viremia (HCC)   . Esophagitis, CMV   . FTT (failure to thrive) in adult   . Physical deconditioning   . AIDS (HCC)   . Candida esophagitis (HCC)   . FUO (fever of unknown origin)   . Arterial hypotension   . Tachypnea   . Tachycardia   . Anemia of chronic disease   . Hyperkalemia 11/07/2015  . Nausea & vomiting 11/07/2015  . Pressure ulcer 11/07/2015  . Elevated lactic acid level 11/07/2015  . Sepsis (HCC)   . Acute encephalopathy 11/06/2015  . Hyponatremia 11/06/2015  . Bilateral pneumonia   . Protein-calorie malnutrition, severe (HCC) 10/28/2015  . CAP (community acquired pneumonia) 10/27/2015  . Human immunodeficiency virus (HIV) disease (HCC)   . Acute hypoxemic respiratory failure (HCC) 10/25/2015  . Hypokalemia   . 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 Anemia 10/24/2015  . Weight loss     Past Medical History:  Past Medical History:  Diagnosis Date  . Anemia   . HIV (human immunodeficiency virus infection) (HCC)    11/06/15 Family states it was just diagnosed this week  . Pneumonia 10/24/2015  . Vitamin D deficiency    Past Surgical History:  Past Surgical History:   Procedure Laterality Date  . ESOPHAGOGASTRODUODENOSCOPY N/A 11/23/2015   Procedure: ESOPHAGOGASTRODUODENOSCOPY (EGD);  Surgeon: Vida Rigger, MD;  [facility] END[facility]COPY;  Service: Endoscopy;  Laterality: N/A;  . TUBAL LIGATION  1980s    Assessment & Plan Clinical Impression: Patient is a 55 y.o.femalewith unintentional 50 lbs weight loss over past few months, anorexia, SOB with progressive weakness. She was admitted 07/03-7/10/17 with fever due to PCP PNA and diagnosed with HIV. She was discharged to home on antiviral therapy but continued to have malaise, N/V with poor oral intake and inability to walk. She was readmitted on 07/17 with lethargy, confusion and weakness due to hyponatremia with Na- 110. She was treated with fluid boluses and kayexalate for management of hyperkalemia. CT head negative for infection or acute changes. Mentation improving and she has had improvement in electrolyte abnormality but decrease in ability to eat due to dysphagia. She was started on IV flucanazole for candida esophagitis. She spiked temp of 103.4 on 07/29 and was started on empiric treatment for MAC with resolution of low grade fevers. She has had decrease in viral load from 200K to 200 with increase in white count and concerns about risk ofIRIS.  She underwent endoscopy (Dr. Ewing Schlein) with biopsies to rule out CMV/HSV on 8/02 due to continued odynophagia. EGD showed ulcers and high CMV viral load. Pancytopenia treated Granix and she was started on Gancyclovir due to high likelihood of CMV esophagitis. She has had improvement in WBC from 1.6-->10.3  and platelets from 115-->195. She continues to have poor po intake but has refused feeding tube or PEG. Hyponatremia improving with electrolyte abnormalities being treated as needed. She continues to be Debilitated but showing increase in voice volume and stabilization of medical status.  Patient transferred to CIR on 11/25/2015 .   Patient currently requires total  with mobility secondary to muscle weakness and muscle joint tightness, decreased cardiorespiratoy endurance, impaired timing and sequencing and decreased motor planning, decreased initiation, decreased attention, decreased problem solving and delayed processing and decreased sitting balance, decreased standing balance, decreased postural control and decreased balance strategies.  Prior to hospitalization, patient was min with mobility and lived with Spouse in a House home.  Home access is 2Stairs to enter to main level with full flight of stairs to second level kitchen/dining and another flight to 3rd level bedrooms.  Patient will benefit from skilled PT intervention to maximize safe functional mobility, minimize fall risk and decrease caregiver burden for planned discharge home with 24 hour assist.  Anticipate patient will benefit from follow up [facility] at discharge.  PT - End of Session Activity Tolerance: Decreased this session Endurance Deficit: Yes Endurance Deficit Description: extremely fatigued with basic mobility, SOB, significant debility PT Assessment Rehab Potential (ACUTE/IP ONLY): Fair Barriers to Discharge: Inaccessible home environment;Decreased caregiver support Barriers to Discharge Comments: Will require 24/7 physical assistance and multiple stairs to access house PT Patient demonstrates impairments in the following area(s): Balance;Endurance;Motor;Skin Integrity PT Transfers Functional Problem(s): Bed Mobility;Bed to Chair;Car PT Locomotion Functional Problem(s): Ambulation;Wheelchair Mobility;Stairs PT Plan PT Intensity: Minimum of 1-2 x/day ,45 to 90 minutes PT Frequency: Total of 15 hours over 7 days of combined therapies PT Duration Estimated Length of Stay: 3-4 weeks PT Treatment/Interventions: Ambulation/gait training;Balance/vestibular training;Cognitive remediation/compensation;Discharge planning;Disease management/prevention;DME/adaptive equipment instruction;Functional  mobility training;Neuromuscular re-education;Patient/family education;Psychosocial support;Skin care/wound management;Stair training;Therapeutic Activities;Therapeutic Exercise;UE/LE Strength taining/ROM;Wheelchair propulsion/positioning PT Transfers Anticipated Outcome(s): Mod A PT Locomotion Anticipated Outcome(s): Supervision w/c mobility, mod A gait short distances PT Recommendation Recommendations for Other Services: Neuropsych consult Follow Up Recommendations: Home health PT;24 hour supervision/assistance Patient destination: Home Equipment Recommended: Wheelchair (measurements);Wheelchair cushion (measurements);Rolling walker with 5" wheels;Other (comment) (possible hospital bed for main level, ramp)  Skilled Therapeutic Intervention Pt received in bed with nurse assisting pt with lunch; pt only able to eat a couple of bites before refusing rest of meal.  Pt reporting being very fatigued but willing to participate in therapy session.  Obtained high back reclining w/c with Jay 2 deep contour cushion due to decubitus pressure ulcer and to allow pt to recline/rest and for postural control.  Pt required total A for supine > sit EOB and max A to maintain sitting balance on bed due to pt presenting with significant head and trunk flexion, R lateral flexion and posterior pelvic tilt keeping COG posterior and pushing hips forward off of air mattress.  Attempted multiple squat pivots bed > w/c but pt unable to keep COG forward and would throw her trunk posterior and to the R.  Performed total A squat pivot bed >w/c and performed multiple scoots posterior in w/c with therapist performing over the back technique to maintain forward/L trunk lean and weight shift.  Positioned pt in w/c and transported pt to gym total A due to fatigue.  In gym performed assessment of LE strength and sensation.  Pt requesting to return to bed despite therapist's encouragement to sit up in w/c for short amount of time.  In w/c pt  maintained her head in complete  R rotation and required cues and facilitation to come to midline and maintain.  Back in room pt performed sit > stand from w/c with UE support on Stedy with max A to initiate forward lean to grab bar and max A to stand; required max A for balance on Stedy due to to pt flexing completely forward over bar.  Performed sit > stand max A to sit on bed and max A for sit > supine in bed.  Pt left in bed with family present and all items within reach.  Pt will likely benefit from 15/7 schedule.   PT Evaluation Precautions/Restrictions Precautions Precautions: Fall General Chart Reviewed: Yes Response to Previous Treatment: Patient reporting fatigue but able to participate. Family/Caregiver Present: Yes Vital SignsTherapy Vitals Temp: 97.7 F (36.5 C) Temp Source: Oral Pulse Rate: (!) 109 Resp: 16 BP: 126/88 Patient Position (if appropriate): Lying Oxygen Therapy SpO2: 98 % O2 Device: Not Delivered Pain Pain Assessment Pain Assessment: No/denies pain Home Living/Prior Functioning Home Living Available Help at Discharge: Family;Available 24 hours/day Type of Home: House Home Access: Stairs to enter [employer] of Steps: 2 Entrance Stairs-Rails: None Home Layout: Multi-level;Bed/bath upstairs Alternate Level Stairs-Number of Steps: pt reports full flight of stairs to second level where kitchen, dining, are and bedrooms on third level Alternate Level Stairs-Rails: Left  Lives With: Spouse Prior Function Level of Independence: Requires assistive device for independence;Needs assistance with gait;Needs assistance with tranfers  Able to Take Stairs?: Yes Driving: No Sensation Sensation Light Touch: Appears Intact Stereognosis: Not tested Hot/Cold: Not tested Proprioception: Appears Intact Coordination Gross Motor Movements are Fluid and Coordinated: Not tested Fine Motor Movements are Fluid and Coordinated: Not tested Motor  Motor Motor:  Abnormal postural alignment and control Motor - Skilled Clinical Observations: sits in extreme flexion, R lateral flexion but with posteior pelvic tilt and COG posterior  Mobility Bed Mobility Bed Mobility: Supine to Sit;Sit to Supine Supine to Sit: 1: +1 Total assist Sit to Supine: 1: +1 Total assist Transfers Transfers: Yes Squat Pivot Transfers: 1: +1 Total assist Transfer via Lift Equipment: Stedy Locomotion  Ambulation Ambulation: No Gait Gait: No Stairs / Additional Locomotion Stairs: No Wheelchair Mobility Wheelchair Mobility: No (total A due to fatigue)  Trunk/Postural Assessment  Cervical Assessment Cervical Assessment: Exceptions to [facility] (flexion and full R rotation; needs cues to bring to midline) Thoracic Assessment Thoracic Assessment: Exceptions to [facility] (significant flexion and R trunk shortening) Lumbar Assessment Lumbar Assessment: Exceptions to [facility] (significant posterior tilt) Postural Control Postural Control: Deficits on evaluation (no head or trunk righting; keeps COG posterior and to the R)  Balance Balance Balance Assessed: Yes Static Sitting Balance Static Sitting - Level of Assistance: 2: Max assist Dynamic Sitting Balance Dynamic Sitting - Level of Assistance: 1: +1 Total assist Dynamic Sitting - Balance Activities: Forward lean/weight shifting Static Standing Balance Static Standing - Balance Support: Right upper extremity supported;Left upper extremity supported Static Standing - Level of Assistance: 2: Max assist;1: +1 Total assist Extremity Assessment  RLE Assessment RLE Assessment: Exceptions to WFL (2-3/5 ) LLE Assessment LLE Assessment: Exceptions to [facility] (2-3/5)   See Function Navigator for Current Functional Status.   Refer to Care Plan for Long Term Goals  Recommendations for other services: Neuropsych  Discharge Criteria: Patient will be discharged from PT if patient refuses treatment 3 consecutive times without medical reason, if  treatment goals not met, if there is a change in medical status, if patient makes no progress towards goals or if  patient is discharged from hospital.  The above assessment, treatment plan, treatment alternatives and goals were discussed and mutually agreed upon: by patient and by family  Malachy Mood 11/26/2015, 4:07 PM

## 2015-11-26 NOTE — Evaluation (Signed)
Speech Language Pathology Assessment and Plan  Patient Details  Name: Samantha Kline MRN: [id number] Date of Birth: 1960/05/13  SLP Diagnosis: Dysarthria;Cognitive Impairments;Dysphagia  Rehab Potential: Fair ELOS: 12-16 days   Today's Date: 11/26/2015 SLP Individual Time: 0800-0900 SLP Individual Time Calculation (min): 60 min  Problem List: Patient Active Problem List   Diagnosis Date Noted  . HIV encephalopathy ([place])   . Benign essential HTN   . Dysphagia   . Antineoplastic chemotherapy induced pancytopenia ([place])   . Drug-induced folate deficiency anemia   . Physical debility 11/25/2015  . Esophageal ulcer   . Malnutrition of moderate degree 11/22/2015  . Anemia due to bone marrow failure ([place])   . Cytomegalovirus (CMV) viremia ([place])   . Esophagitis, CMV   . FTT (failure to thrive) in adult   . Physical deconditioning   . AIDS ([place])   . Candida esophagitis ([place])   . FUO (fever of unknown origin)   . Arterial hypotension   . Tachypnea   . Tachycardia   . Anemia of chronic disease   . Hyperkalemia 11/07/2015  . Nausea & vomiting 11/07/2015  . Pressure ulcer 11/07/2015  . Elevated lactic acid level 11/07/2015  . Sepsis ([place])   . Acute encephalopathy 11/06/2015  . Hyponatremia 11/06/2015  . Bilateral pneumonia   . Protein-calorie malnutrition, severe ([place]) 10/28/2015  . CAP (community acquired pneumonia) 10/27/2015  . Human immunodeficiency virus (HIV) disease ([place])   . Acute hypoxemic respiratory failure ([place]) 10/25/2015  . Hypokalemia   . 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 Anemia 10/24/2015  . Weight loss    Past Medical History:  Past Medical History:  Diagnosis Date  . Anemia   . HIV (human immunodeficiency virus infection) ([place])    11/06/15 Family states it was just diagnosed this week  . Pneumonia 10/24/2015  . Vitamin D deficiency    Past Surgical History:  Past Surgical History:  Procedure Laterality Date  .  ESOPHAGOGASTRODUODENOSCOPY N/A 11/23/2015   Procedure: ESOPHAGOGASTRODUODENOSCOPY (EGD);  Surgeon: Clarene Essex, MD;  Location: [facility] ENDOSCOPY;  Service: Endoscopy;  Laterality: N/A;  . TUBAL LIGATION  1980s    Assessment / Plan / Recommendation Clinical Impression Samantha Brownis a 55 y.o.femalewith unintentional 50 lbs weight loss over past few months, anorexia, SOB with progressive weakness. She was admitted 07/03-7/10/17 with fever due to PCP PNA and diagnosed with HIV. She was discharged to home on antiviral therapy but continued to have malaise, N/V with poor oral intake and inability to walk. She was readmitted on 07/17 with lethargy, confusion and weakness due to hyponatremia with Na- 110. She was treated with fluid boluses and kayexalate for management of hyperkalemia. CT head negative for infection or acute changes. Mentation improving and she has had improvement in electrolyte abnormality but decrease in ability to eat due to dysphagia. She was started on IV flucanazole for candida esophagitis.  She spiked temp of 103.4 on 07/29 and was started on empiric treatment for MAC with resolution of low grade fevers.  She has had decrease in viral load from 200K to 200 with increase in white count and concerns about risk of IRIS.  She underwent endoscopy (Dr. Watt Climes)  with biopsies to rule out CMV/HSV on 8/02 due to continued odynophagia. EGD showed ulcers and high CMV viral load.   Pancytopenia treated Granix and she was started on Gancyclovir due to high likelihood of CMV esophagitis. She has had improvement in WBC from 1.6-->10.3 and platelets  from 115-->195.  She continues to have poor po intake but has refused feeding tube or PEG.  Hyponatremia improving with electrolyte abnormalities being treated as needed. She continues to be  Debilitated but showing increase in voice volume and stabilization of medical status.  Therapy ongoing and CIR recommended for follow up therapy.   Patient was [facility] to  [facility]  11[facility]25/15 and demonstrates severe cognitive impairments characterized by poor initiation, sustained attention, storage of information, recall and awareness of current deficits, which impact the patient's overall safety with functional self-care tasks. Patient's cognition also impact swallow function and result in oral holding with poor awareness of boluses.  Recommend to continue with current diet orders and full supervision.  Patient would benefit from skilled SLP intervention in order to maximize her functional independence prior to discharge. Anticipate patient will require 24 hour supervision at home and follow up SLP services.    Skilled Therapeutic Interventions          Skilled treatment session focused on addressing cognition goals. SLP facilitated session by providing Max assist verbal cues for initiation of basic self-feeding task as well as significantly increased time and Max assist verbal cues to initiate swallows.  Continue with current plan of care.     SLP Assessment  Patient will need skilled Speech Lanaguage Pathology Services during CIR admission    Recommendations  SLP Diet Recommendations: Dysphagia 3 (Mech soft);Thin Liquid Administration via: Cup;Straw Medication Administration: Whole meds with puree Supervision: Full supervision/cueing for compensatory strategies Compensations: Slow rate;Small sips/bites;Lingual sweep for clearance of pocketing Postural Changes and/or Swallow Maneuvers: Seated upright 90 degrees;Upright 30-60 min after meal Oral Care Recommendations: Oral care BID Recommendations for Other Services: Neuropsych consult Patient destination: Home Follow up Recommendations: 24 hour supervision/assistance;Home Health SLP Equipment Recommended: None recommended by SLP    SLP Frequency 3 to 5 out of 7 days   SLP Duration  SLP Intensity  SLP Treatment/Interventions 12-16 days  Minumum of 1-2 x/day, 30 to 90 minutes  Cognitive  remediation/compensation;Cueing hierarchy;Functional tasks;Environmental controls;Dysphagia/aspiration precaution training;Internal/external aids;Speech/Language facilitation;Therapeutic Activities    Pain Pain Assessment Pain Assessment: No/denies pain  Prior Functioning Cognitive/Linguistic Baseline: Information not available Type of Home: House  Lives With: Spouse  Function:  Eating Eating   Modified Consistency Diet: Yes Eating Assist Level: More than reasonable amount of time;Set up assist for;Supervision or verbal cues;Helper checks for pocketed food;Helper scoops food on utensil;Help with picking up utensils   Eating Set Up Assist For: Opening containers;Cutting food Helper Carlos on Utensil: Occasionally     Cognition Comprehension Comprehension assist level: Understands basic 25 - 49% of the time/ requires cueing 50 - 75% of the time  Expression   Expression assist level: Expresses basic 25 - 49% of the time/requires cueing 50 - 75% of the time. Uses single words/gestures.  Social Interaction Social Interaction assist level: Interacts appropriately 25 - 49% of time - Needs frequent redirection.  Problem Solving Problem solving assist level: Solves basic 25 - 49% of the time - needs direction more than half the time to initiate, plan or complete simple activities  Memory Memory assist level: Recognizes or recalls less than 25% of the time/requires cueing greater than 75% of the time   Short Term Goals: Week 1: SLP Short Term Goal 1 (Week 1): Patient will initiate self-care tasks in under 30 seconds with no more than 2 verbal cues  SLP Short Term Goal 2 (Week 1): Patient will initiate verbal expression of basic  wants and needs with Mod question cues  SLP Short Term Goal 3 (Week 1): Patient will problem solve use of call bell with Mod assist verbal and visual cues  SLP Short Term Goal 4 (Week 1): Patient will utilize external aids to recall daily information with Mod  assist cues SLP Short Term Goal 5 (Week 1): Patient will demonstrate sustained attention to familiar tasks for 5 minutes with Mod verbal cues for redirection  SLP Short Term Goal 6 (Week 1): Patient will demonstrate effective mastication and oral clearance of soft solid textures with Mod verbal cues  Refer to Care Plan for Long Term Goals  Recommendations for other services: Neuropsych  Discharge Criteria: Patient will be discharged from SLP if patient refuses treatment 3 consecutive times without medical reason, if treatment goals not met, if there is a change in medical status, if patient makes no progress towards goals or if patient is discharged from hospital.  The above assessment, treatment plan, treatment alternatives and goals were discussed and mutually agreed upon: No family available/patient unable  Gunnar Fusi, M.A., [place]  [place] 11/26/2015, 10:20 AM

## 2015-11-26 NOTE — Progress Notes (Addendum)
Occupational Therapy Assessment and Plan  Patient Details  Name: Samantha Kline MRN: [id number] Date of Birth: May 17, 1960  OT Diagnosis: abnormal posture, altered mental status, cognitive deficits and muscle weakness (generalized) Rehab Potential: Rehab Potential (ACUTE ONLY): Good ELOS:   3-4 weeks  Today's Date: 11/26/2015 OT Individual Time:  - 1105-1210 (65 minutes)        Problem List: Patient Active Problem List   Diagnosis Date Noted  . HIV encephalopathy (HCC)   . Benign essential HTN   . Dysphagia   . Antineoplastic chemotherapy induced pancytopenia (HCC)   . Drug-induced folate deficiency anemia   . Physical debility 11/25/2015  . Esophageal ulcer   . Malnutrition of moderate degree 11/22/2015  . Anemia due to bone marrow failure (HCC)   . Cytomegalovirus (CMV) viremia (HCC)   . Esophagitis, CMV   . FTT (failure to thrive) in adult   . Physical deconditioning   . AIDS (HCC)   . Candida esophagitis (HCC)   . FUO (fever of unknown origin)   . Arterial hypotension   . Tachypnea   . Tachycardia   . Anemia of chronic disease   . Hyperkalemia 11/07/2015  . Nausea & vomiting 11/07/2015  . Pressure ulcer 11/07/2015  . Elevated lactic acid level 11/07/2015  . Sepsis (HCC)   . Acute encephalopathy 11/06/2015  . Hyponatremia 11/06/2015  . Bilateral pneumonia   . Protein-calorie malnutrition, severe (HCC) 10/28/2015  . CAP (community acquired pneumonia) 10/27/2015  . Human immunodeficiency virus (HIV) disease (HCC)   . Acute hypoxemic respiratory failure (HCC) 10/25/2015  . Hypokalemia   . 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 Anemia 10/24/2015  . Weight loss     Past Medical History:  Past Medical History:  Diagnosis Date  . Anemia   . HIV (human immunodeficiency virus infection) (HCC)    11/06/15 Family states it was just diagnosed this week  . Pneumonia 10/24/2015  . Vitamin D deficiency    Past Surgical History:   Past Surgical History:  Procedure Laterality Date  . ESOPHAGOGASTRODUODENOSCOPY N/A 11/23/2015   Procedure: ESOPHAGOGASTRODUODENOSCOPY (EGD);  Surgeon: Vida Rigger, MD;  Location: [facility] ENDOSCOPY;  Service: Endoscopy;  Laterality: N/A;  . TUBAL LIGATION  1980s    Assessment & Plan Clinical Impression:  Samantha Kline a 55 y.o.femalewith unintentional 50 lbs weight loss over past few months, anorexia, SOB with progressive weakness. She was admitted 07/03-7/10/17 with fever due to PCP PNA and diagnosed with HIV. She was discharged to home on antiviral therapy but continued to have malaise, N/V with poor oral intake and inability to walk. She was readmitted on 07/17 with lethargy, confusion and weakness due to hyponatremia with Na- 110. She was treated with fluid boluses and kayexalate for management of hyperkalemia. CT head negative for infection or acute changes. Mentation improving and she has had improvement in electrolyte abnormality but decrease in ability to eat due to dysphagia. She was started on IV flucanazole for candida esophagitis.  She spiked temp of 103.4 on 07/29 and was started on empiric treatment for MAC with resolution of low grade fevers.  She has had decrease in viral load from 200K to 200 with increase in white count and concerns about risk of IRIS.  She underwent endoscopy (Dr. Ewing Schlein)  with biopsies to rule out CMV/HSV on 8/02 due to continued odynophagia. EGD showed ulcers and high CMV viral load.   Pancytopenia treated Granix and she was started on Gancyclovir due to  high likelihood of CMV esophagitis. She has had improvement in WBC from 1.6-->10.3 and platelets from 115-->195.  She continues to have poor po intake but has refused feeding tube or PEG.  Hyponatremia improving with electrolyte abnormalities being treated as needed. She continues to be  Debilitated but showing increase in voice volume and stabilization of medical status.  Patient currently requires Total A for  functional transfers and Max Ax2 for LB ADLs secondary to muscle weakness, decreased cardiorespiratoy endurance, decreased problem solving, decreased safety awareness and decreased memory and decreased sitting balance, decreased standing balance and decreased postural control.  Prior to hospitalization, patient could complete BADLs with Mod A.   Patient will benefit from skilled intervention to decrease level of assist with basic self-care skills prior to discharge home with care partner.  Anticipate patient will require 24 hour supervision and follow up home health.  OT - End of Session Activity Tolerance: Tolerates 10 - 20 min activity with multiple rests Endurance Deficit: Yes Endurance Deficit Description: extremely fatigued with basic mobility, SOB, significant debility OT Assessment Rehab Potential (ACUTE ONLY): Good Barriers to Discharge: Decreased caregiver support OT Patient demonstrates impairments in the following area(s): Balance;Cognition;Endurance;Motor;Safety;Perception OT Basic ADL's Functional Problem(s): Eating;Grooming;Bathing;Dressing;Toileting OT Transfers Functional Problem(s): Toilet;Tub/Shower OT Plan OT Intensity: Minimum of 1-2 x/day, 45 to 90 minutes OT Frequency: 5 out of 7 days OT Treatment/Interventions: Discharge planning;Self Care/advanced ADL retraining;Therapeutic Activities;Functional mobility training;Cognitive remediation/compensation;Patient/family education;Therapeutic Exercise;Visual/perceptual remediation/compensation;DME/adaptive equipment instruction;Neuromuscular re-education;UE/LE Strength taining/ROM;Wheelchair propulsion/positioning OT Self Feeding Anticipated Outcome(s): Supervision-Mod I  OT Basic Self-Care Anticipated Outcome(s): Mod A OT Toileting Anticipated Outcome(s): Mod A OT Bathroom Transfers Anticipated Outcome(s): Mod A OT Recommendation Patient destination: Home Follow Up Recommendations: Home health OT Equipment Recommended: To be  determined   Skilled Therapeutic Intervention Pt was lying in bed upon skilled OT arrival for evaluation at CIR. Pt was able to provide PLOF information with increased time and slight verbal agitation when inquired about how much assistance family provided at home for self care. Pt reported that family did not have to help prior to hospitalization. PLOF info from family will be retrieved at later time. Pt then reported urinating in bed and not having brief. Pt was agreeable to get out of bed to complete bathing while sitting at sink. Pt completed functional stand pivot transfer to w/c with Total A and 1 standby assist for safety. Pt completed UB/LB dressing with overall Max Ax2 for standing at sink. Severe activity tolerance, proprioception, UB strength, and coordination deficits observed via formal and informal assessment completion. At end of session pt returned to bed with 4 bedrails up and all needs within reach. No c/o pain, but pt reported feelings of frustration intermittently during session with spells of crying. Pt was provided encouragement during session to complete self care while maintaining positive psychosocial wellness. Recommend neuropsych eval. Nursing reported that pt requires a person to assist with self feeding. Though self feeding was not formally assessed, skilled OT will assess at later time to include in goals.    OT Evaluation Precautions/Restrictions  Precautions Precautions: Fall Restrictions Weight Bearing Restrictions: No General Chart Reviewed: Yes Family/Caregiver Present: No Vital Signs  Pain Pain Assessment Pain Assessment: No/denies pain Home Living/Prior Functioning Home Living Family/patient expects to be discharged to:: Private residence Living Arrangements: Spouse/significant other Available Help at Discharge: Family, Available 24 hours/day Type of Home: House Home Access: Stairs to enter [employer] of Steps: 2 Entrance Stairs-Rails:  None Home Layout: Multi-level Alternate Level Stairs-Number of Steps:  (  9-10 steps to second and third floors) Alternate Level Stairs-Rails: Left Bathroom Shower/Tub: Chiropodist: Standard Bathroom Accessibility: Yes  Lives With: Spouse IADL History Occupation: Retired 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 Leisure and Hobbies: Spending time with 7 grandchildren IADL Comments: Pt reported being able to do "all of it" (cooking/cleaning) prior to hospitalization Prior Function Level of Independence: Needs assistance with ADLs, Needs assistance with homemaking  Able to Take Stairs?: Yes Driving: No ADL ADL Upper Body Bathing: Moderate assistance Where Assessed-Upper Body Bathing: Sitting at sink Lower Body Bathing: Maximal assistance Where Assessed-Lower Body Bathing: Standing at sink Upper Body Dressing: Maximal assistance Where Assessed-Lower Body Dressing: Sitting at sink Toileting: Not assessed Toilet Transfer: Not assessed Tub/Shower Transfer: Not assessed Vision/Perception  Vision- History Patient Visual Report: No change from baseline Vision- Assessment Vision Assessment?: No apparent visual deficits;Yes Eye Alignment: Within Functional Limits Tracking/Visual Pursuits: Able to track stimulus in all quads without difficulty Saccades: Within functional limits  Cognition Arousal/Alertness: Lethargic Orientation Level: Person Year: Other (Comment) (1977) Month: July Day of Week: Incorrect Memory: Impaired Memory Impairment: Storage deficit;Decreased recall of new information Immediate Memory Recall: Blue Attention: Sustained Sustained Attention: Impaired Awareness: Impaired Behaviors: Verbal agitation Safety/Judgment: Impaired Sensation Sensation Light Touch: Appears Intact Stereognosis: Not tested Hot/Cold: Not tested Proprioception: Impaired by gross assessment Coordination Gross Motor Movements are Fluid and Coordinated:  No Fine Motor Movements are Fluid and Coordinated: No Finger Nose Finger Test: Impaired proprioceptive awareness  Motor  Motor Motor: Abnormal postural alignment and control Motor - Skilled Clinical Observations: sits in extreme flexion, R lateral flexion but with posteior pelvic tilt and COG posterior Mobility  Bed Mobility Bed Mobility: Supine to Sit Supine to Sit: 1: +1 Total assist Supine to Sit: Patient Percentage: 10% Supine to Sit Details: Verbal cues for safe use of DME/AE;Verbal cues for technique;Manual facilitation for weight shifting Sit to Supine: 1: +1 Total assist  Trunk/Postural Assessment  Cervical Assessment Cervical Assessment: Exceptions to [facility] Thoracic Assessment Thoracic Assessment: Exceptions to [facility] Lumbar Assessment Lumbar Assessment: Exceptions to [facility] Postural Control Postural Control: Deficits on evaluation  Balance Balance Balance Assessed: Yes Static Sitting Balance Static Sitting - Level of Assistance: 2: Max assist Dynamic Sitting Balance Dynamic Sitting - Level of Assistance: 1: +1 Total assist Dynamic Sitting - Balance Activities: Forward lean/weight shifting Static Standing Balance Static Standing - Balance Support: Right upper extremity supported;Left upper extremity supported Static Standing - Level of Assistance: 1: +1 Total assist Extremity/Trunk Assessment RUE Assessment RUE Assessment: Exceptions to [facility] (3-/5 MMT) LUE Assessment LUE Assessment: Exceptions to [facility] (3-/5 MMT)   See Function Navigator for Current Functional Status.   Refer to Care Plan for Long Term Goals  Recommendations for other services: Neuropsych  Discharge Criteria: Patient will be discharged from OT if patient refuses treatment 3 consecutive times without medical reason, if treatment goals not met, if there is a change in medical status, if patient makes no progress towards goals or if patient is discharged from hospital.  The above assessment, treatment  plan, treatment alternatives and goals were discussed and mutually agreed upon: by patient  Skeet Simmer 11/26/2015, 6:37 PM

## 2015-11-26 NOTE — Progress Notes (Signed)
Patient ate less than 5% of all meals, refused snacks. Is not willing to accept assistance to getting food to her mouth, "I don't need help to eat." After 1-2 bites states she is too fatigued to continue. Drank 1 ensure today over an hour assisted by spouse. Educated patient on need for food to increase energy for recovery. Educated spouse, son and daughter-in-law on need to encourage oral intake.

## 2015-11-26 NOTE — IPOC Note (Addendum)
Overall Plan of Care [facility]) Patient Details Name: Samantha Kline MRN: [id number] DOB: 03-13-1961  Admitting Diagnosis: Debility  Hospital Problems: Principal Problem:   Physical debility Active Problems:   Human immunodeficiency virus (HIV) disease (HCC)   Acute encephalopathy   HIV encephalopathy (HCC)   Benign essential HTN   Dysphagia   Antineoplastic chemotherapy induced pancytopenia (HCC)   Drug-induced folate deficiency anemia     Functional Problem List: Nursing Bladder, Bowel, Endurance, Motor, Nutrition, Safety, Sensory, Skin Integrity  PT Balance, Endurance, Motor, Skin Integrity  OT Balance, Cognition, Endurance, Motor, Safety, Perception  SLP Behavior, Cognition, Linguistic, Nutrition, Safety  TR         Basic ADL's: OT Eating, Grooming, Bathing, Dressing, Toileting     Advanced  ADL's: OT       Transfers: PT Bed Mobility, Bed to Chair, Customer service manager, Tub/Shower     Locomotion: PT Ambulation, Psychologist, prison and probation services, Stairs     Additional Impairments: OT    SLP Swallowing, Communication expression, comprehension Social Interaction, Problem Solving, Memory, Attention, Awareness  TR      Anticipated Outcomes Item Anticipated Outcome  Self Feeding Supervision-Mod I   Swallowing  Supervision    Basic self-care  Mod A  Toileting  Mod A   Bathroom Transfers Mod A  Bowel/Bladder  Continent to bowel and bladder with mod. assisst.  Transfers  Mod A  Locomotion  Supervision w/c mobility, mod A gait short distances  Communication  Min assist   Cognition  Min assist with basic   Pain  Less than 3,on 1 to 10 scale.  Safety/Judgment  Free from fall during her stay in rehab.   Therapy Plan: PT Intensity: Minimum of 1-2 x/day ,45 to 90 minutes PT Frequency: Total of 15 hours over 7 days of combined therapies PT Duration Estimated Length of Stay: 3-4 weeks OT Intensity: Minimum of 1-2 x/day, 45 to 90 minutes OT Frequency: 5 out of 7 days  OT  Duration: 3-4 weeks SLP Intensity: Minumum of 1-2 x/day, 30 to 90 minutes SLP Frequency: 3 to 5 out of 7 days SLP Duration/Estimated Length of Stay: 12-16 days       Team Interventions: Nursing Interventions Patient/Family Education, Bladder Management, Bowel Management, Medication Management, Disease Management/Prevention, Skin Care/Wound Management, Dysphagia/Aspiration Precaution Training, Discharge Planning  PT interventions Ambulation/gait training, Balance/vestibular training, Cognitive remediation/compensation, Discharge planning, Disease management/prevention, DME/adaptive equipment instruction, Functional mobility training, Neuromuscular re-education, Patient/family education, Psychosocial support, Skin care/wound management, Stair training, Therapeutic Activities, Therapeutic Exercise, UE/LE Strength taining/ROM, Wheelchair propulsion/positioning  OT Interventions Discharge planning, Self Care/advanced ADL retraining, Therapeutic Activities, Functional mobility training, Cognitive remediation/compensation, Patient/family education, Therapeutic Exercise, Visual/perceptual remediation/compensation, DME/adaptive equipment instruction, Neuromuscular re-education, UE/LE Strength taining/ROM, Wheelchair propulsion/positioning  SLP Interventions Cognitive remediation/compensation, Cueing hierarchy, Functional tasks, Environmental controls, Dysphagia/aspiration precaution training, Internal/external aids, Speech/Language facilitation, Therapeutic Activities  TR Interventions    SW/CM Interventions  Psychosocial Assessment, Pt/Family Education & Discharge Planning    Team Discharge Planning: Destination: PT-Home ,OT- Home , SLP-Home Projected Follow-up: PT-Home health PT, 24 hour supervision/assistance, OT-  Home health OT, SLP-24 hour supervision/assistance, Home Health SLP Projected Equipment Needs: PT-Wheelchair (measurements), Wheelchair cushion (measurements), Rolling walker with 5" wheels,  Other (comment) (possible hospital bed for main level, ramp), OT- To be determined, SLP-None recommended by SLP Equipment Details: PT- , OT-  Patient/family involved in discharge planning: PT- Patient, Family member/caregiver,  OT-Patient, SLP-Patient unable/family or caregive not available  MD ELOS: 16-19 days. Medical Rehab Prognosis:  Good Assessment: 54  y.o.femalewith unintentional 50 lbs weight loss over past few months, anorexia, SOB with progressive weakness. She was admitted 07/03-7/10/17 with fever due to PCP PNA and diagnosed with HIV. She was discharged to home on antiviral therapy but continued to have malaise, N/V with poor oral intake and inability to walk. She was readmitted on 07/17 with lethargy, confusion and weakness due to hyponatremia with Na- 110. She was treated with fluid boluses and kayexalate for management of hyperkalemia. CT head negative for infection or acute changes. Mentation improving and she has had improvement in electrolyte abnormality but decrease in ability to eat due to dysphagia. She was started on IV flucanazole for candida esophagitis.  She spiked temp of 103.4 on 07/29 and was started on empiric treatment for MAC with resolution of low grade fevers.  She has had decrease in viral load from 200K to 200 with increase in white count and concerns about risk of IRIS.  She underwent endoscopy (Dr. Ewing Schlein)  with biopsies to rule out CMV/HSV on 8/02 due to continued odynophagia. EGD showed ulcers and high CMV viral load.   Pancytopenia treated Granix and she was started on Gancyclovir due to high likelihood of CMV esophagitis. She has had improvement in WBC from 1.6-->10.3 and platelets from 115-->195.  She continues to have poor po intake but has refused feeding tube or PEG.  Hyponatremia improving with electrolyte abnormalities being treated as needed. She continues to be  Debilitated but showing increase in voice volume and stabilization of medical status. Resulting  functional deficits with endurance, generalized weakness, and mobility.  Will set goals for Min/Mod A with therapies.       See Team Conference Notes for weekly updates to the plan of care

## 2015-11-26 NOTE — Progress Notes (Signed)
Initial Nutrition Assessment  DOCUMENTATION CODES:  Non-severe (moderate) malnutrition in context of acute illness/injury   Pt meets criteria for ATLEAST MODERATE MALNUTRITION in the context of Acute Illness as evidenced by an intake that is estimated to meet < or equal to 50% of needs for > or equal to 5 days and mod muscle/fat wasting.   INTERVENTION:  Initiate Calorie Count x 48 hrs. RD to follow up Monday  Spoke with pt and obtained ideas for snacks/meals- will be sent to dietary  Continue Ensure Enlive, each supplement provides 350 kcal and 20 grams of protein  Will try Magic cup. Once between Breakfast and Lunch  each supplement provides 290 kcal and 9 grams of protein  If not already tried, recommend Megace.   NUTRITION DIAGNOSIS:  Inadequate oral intake related to lethargy/confusion, poor appetite as evidenced by meal completion < 25%.  GOAL:  Patient will meet greater than or equal to 90% of their needs  MONITOR:  PO intake, Supplement acceptance, Diet advancement, Weight trends, Labs, I & O's, TF tolerance  REASON FOR ASSESSMENT:  Consult Calorie Count + Poor PO intake  ASSESSMENT:  55 y/o female who had originally presented on 7/3 with 50 lb weight loss over few months, anorexia, SOB, progressive weakness. Worked up for PCP PNA and diagnosed with AIDS. Went home on antiviral therapy but continued with n/v, poor intake and inability to walk. Readmitted on 7/17 with lethargy, confusion and weakness and severe hyponatremia (110). Developed candida esophagitis. Progressed, but intake has remained poor with refusal of PEG/NGT. Transferred to inpatient rehab on 8/4. RD consulted for calorie count/poor PO intake  Review chart in depth. Pt has been followed closely by RD throughout the past month. Pt appeared to do best with Ensure supplements, but recently her intake of those has also declined. Due to continued poor PO intake, TF was recommended, but pt has refused this option.  Per last RD note, palliative care consult was being discussed.    Pt has been on marinol, see in D/C summary pt was to be placed on megace, but does not appear to be on this now.   Intake records shows patient has frequently refused to eat. Their no documentation of a meal intake >30% since 7/19. Meal intake most often 0% or a few bites. Pt has refused feeding tube.   Spoke with RN prior to pt. She states ST worked with patient this morning at breakfast. Pt would only eat with her hands and did not accept assistance from ST. Many of her bites missed her mouth. Also, pt was distracted by IV nurse and did not eat further after being distracted. Overall, pt ate <10% of breakfast. RD went over calorie count instructions with RN who took envelope to be placed on door.   RD attempted to converse with patient. Her responses were appropriate, but she was very somnolent and spoke so softly that it was almost inaudible.   RD first inquired about her Ensure's and if she is tired of them. She says she still likes them, strawberry the most. Will request that one be given consistently.   She states all the other food is "Lexicographer" Given pts mouth sores, RD offered colder items.   She was accepting of vanilla ice cream or pudding. She said she would eat chicken salad. She did not want soup. She did not want cottage cheese. Snack-wise she was accepting of PB crackers/Nabs.   RD went over potential meal options. She stated she  would eat a chicken salad sandwich, but that would be all she could tolerate and if she ate more, she would become sick.   RD tried to determine breakfast requests, but she kept repeating that she did not want anything because she was full. She was not able to understand this was for tomorrow. She did say she would like canned fruit. Will order this with a gravy biscuit.   Pt closed and her eyes and would not offer any more understandable commentary.    Medications: multiple abx,  marinol, iron,  Dolutegravir, Vit D, Vit b12, mvi with min,  Ganciclovir, Emtricitabine/tenofovir  Labs reviewed: Lipid panel: TG: 250, HDL <10, VLDL 50, Albumin:1.4  Recent Labs Lab 11/21/15 0800 11/23/15 0928 11/24/15 0454  NA 132* 135 132*  K 3.6 3.0* 3.7  CL 108 109 103  CO2 BUN CREATININE 0.67 0.71 0.61  CALCIUM 7.1* 6.9* 7.3*  MG 2.0  --  1.7  GLUCOSE 119* 210* 99   Diet Order:  DIET SOFT Room service appropriate? Yes; Fluid consistency: Thin  Skin:3 different stage 2 PUs to sacrum,   Last BM:  8/4  Height:  Ht Readings from Last 1 Encounters:  11/25/15  (1.626 m)   Weight:  Wt Readings from Last 1 Encounters:  11/25/15 133 lb (60.3 kg)   Wt Readings from Last 10 Encounters:  11/25/15 133 lb (60.3 kg)  11/23/15 133 lb 11.2 oz (60.6 kg)  10/31/15 129 lb 3 oz (58.6 kg)  08/09/15 150 lb (68 kg)   Ideal Body Weight:  54.54 kg  BMI:  Body mass index is 22.83 kg/m.  Estimated Nutritional Needs:  Kcal:  1700-1900 (28-31 kcal/kg bw) Protein:  84-96 g (1.4-1.6 g/kg bw) Fluid:  >1.8 liters (30 ml/kg bw)  EDUCATION NEEDS:  No education needs identified at this time  Christophe Louis RD, LDN, CNSC Clinical Nutrition Pager: [phone] 11/26/2015 12:20 PM

## 2015-11-26 NOTE — Progress Notes (Addendum)
[facility] PHYSICAL MEDICINE & REHABILITATION     PROGRESS NOTE  Subjective/Complaints:  Pt laying in bed sleeping this AM.  She is tired and would like to rest more.   ROS: Denies CP, SOB, N/V/D.  Objective: Vital Signs: Blood pressure (!) 143/78, pulse 99, temperature 99.3 F (37.4 C), temperature source Oral, resp. rate 18, height 5\' 4"  (1.626 m), weight 60.3 kg (133 lb), SpO2 100 %. No results found.  Recent Labs  11/24/15 0454  WBC 10.3  HGB 9.3*  HCT 28.8*  PLT 195    Recent Labs  11/23/15 0928 11/24/15 0454  NA 135 132*  K 3.0* 3.7  CL 109 103  GLUCOSE 210* 99  BUN 8 7  CREATININE 0.71 0.61  CALCIUM 6.9* 7.3*   CBG (last 3)  No results for input(s): GLUCAP in the last 72 hours.  Wt Readings from Last 3 Encounters:  11/25/15 60.3 kg (133 lb)  11/23/15 60.6 kg (133 lb 11.2 oz)  10/31/15 58.6 kg (129 lb 3 oz)    Physical Exam:  BP (!) 143/78 (BP Location: Right Arm)   Pulse 99   Temp 99.3 F (37.4 C) (Oral)   Resp 18   Ht 5\' 4"  (1.626 m)   Wt 60.3 kg (133 lb)   SpO2 100%   BMI 22.83 kg/m  Constitutional: Vital signs are normal. She appears listless. She appears cachectic. She has a sickly appearance.  HENT: Normocephalic and atraumatic.  Mouth/Throat: Oral lesions present.  Eyes: Conjunctivae and EOM are normal.  Cardiovascular: Regular rate and rhythm. No murmur heard. Respiratory: Effort normal and breath sounds normal. No stridor. No respiratory distress. She has no wheezes.  GI: Soft. Bowel sounds are normal. She exhibits no distension. There is no tenderness.  Musculoskeletal: She exhibits no edema or tenderness.  Muscle wasting all limbs with decreased ROM.  Neurological: She appears listless.  Motor appears to be: B/l UE 3/5 deltoid, biceps, triceps, 3+ wrist and HI.  B/l LE: 2/5 HF, 2+ KE/KF and 3/5 ADF/PF.  Skin: Skin is warm and dry. No rash noted. No erythema. Sacral ulcer not examined today Psychiatric: Her affect is inappropriate.  Her speech is delayed. She is slowed and withdrawn. Cognition and memory are impaired.    Assessment/Plan: 1. 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 Physiatrist is providing close team supervision and 24 hour management of active medical problems listed below. Physiatrist and rehab team continue to assess barriers to discharge/monitor patient progress toward functional and medical goals.  Function:  Bathing Bathing position      Bathing parts      Bathing assist        Upper Body Dressing/Undressing Upper body dressing                    Upper body assist        Lower Body Dressing/Undressing Lower body dressing                                  Lower body assist        Toileting Toileting          Toileting assist     Transfers Chair/bed transfer             Locomotion Ambulation  Wheelchair          Cognition Comprehension    Expression    Social Interaction    Problem Solving    Memory      Medical Problem List and Plan: 1.  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  DVT Prophylaxis/Anticoagulation: Mechanical: Sequential compression devices, below knee Bilateral lower extremities 3. Pain Management: Tylenol prn 4. Mood: LCSW to follow for evaluation and support.  5. Neuropsych: This patient is not fully capable of making decisions on her own behalf. 6. Skin/Wound Care: Ordered air mattress overlay due to malnutrition, poor intake and stage 2 sacral ulcer.   is at risk for further breakdown 7. Fluids/Electrolytes/Nutrition:  Continue dextrose for now.   Soft diet at present, will advance as tolerated 8. HIV/AIDS with MAC?:  On descovy, ticovey, biaxin and ethambutol.   follow up per ID. Appreciate recs 9. Candida/CMV esophagitis: IV diflucan since  7/21 and On Ganciclovir since 08/03 10. FTT: Continue IVF for now. Started calorie count and check prealbumin.   11. Pancytopenia:  Check labs - anticipate drop in numbers 5-7 days after Granix 12. Hyponatremia/Hypokalemia: Monitor lytes with Mg and Phos levels every 3-4 days.  13. PCP prophylaxis: Bactrim discontinued due to hyponatremia. Has been treated for PCP infection and dapsone resumed 8/4  for prophylaxis.   LOS (Days) 1 A FACE TO FACE EVALUATION WAS PERFORMED  Stefanos Haynesworth Karis Juba 11/26/2015 8:04 AM

## 2015-11-27 ENCOUNTER — Inpatient Hospital Stay (HOSPITAL_COMMUNITY): Payer: Managed Care, Other (non HMO) | Admitting: Occupational Therapy

## 2015-11-27 DIAGNOSIS — D638 Anemia in other chronic diseases classified elsewhere: Secondary | ICD-10-CM

## 2015-11-27 DIAGNOSIS — D72829 Elevated white blood cell count, unspecified: Secondary | ICD-10-CM

## 2015-11-27 LAB — HSV CULTURE AND TYPING

## 2015-11-27 MED ORDER — POTASSIUM CHLORIDE CRYS ER 20 MEQ PO TBCR
40.0000 meq | EXTENDED_RELEASE_TABLET | Freq: Two times a day (BID) | ORAL | Status: AC
  Administered 2015-11-27 (×2): 40 meq via ORAL
  Filled 2015-11-27 (×2): qty 2

## 2015-11-27 MED ORDER — BENEPROTEIN PO POWD
1.0000 | Freq: Three times a day (TID) | ORAL | Status: DC
  Administered 2015-11-27 – 2015-12-08 (×11): 6 g via ORAL
  Filled 2015-11-27: qty 227

## 2015-11-27 NOTE — Progress Notes (Signed)
Occupational Therapy Session Note  Patient Details  Name: Samantha Kline MRN: [id number] Date of Birth: April 26, 1960  Today's Date: 11/27/2015 OT Individual Time: 1102-1210 OT Individual Time Calculation (min): 68 min     Short Term Goals: Week 1:  OT Short Term Goal 1 (Week 1): Pt will complete functional toilet transfer with Max A OT Short Term Goal 2 (Week 1): Pt will complete UB bathing with Min A OT Short Term Goal 3 (Week 1): Pt will complete sit to stand for LB bathing with Max A OT Short Term Goal 4 (Week 1): Pt will engage in 5 minutes of therapeutic activity without rest to increase activity tolerance for self care completion   Skilled Therapeutic Interventions/Progress Updates:   Pt was lying supine in bed upon skilled OT arrival with husband Samantha Kline present. Outside of room, Samantha Kline provided reliable PLOF information and home setup. Samantha Kline reported that prior to hospitalization, family was providing total care with BADLs including bathing/dressing bedlevel and carrying pt up and down stairs. Samantha Kline reported planning to keep pt on 3rd level with a bedroom and carrying pt around. Samantha Kline was provided education on OT concerns with plan and setup that skilled OT can assist in providing in home to decrease family burden of care and maximize safety at time of discharge. Samantha Kline was provided home measurement sheet due to concerns regarding having w/c accessible doorways. 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  When [place] left, pt was agreeable to get out of bed. Pt completed supine to sit with Total Assist (pt assist 15%) and while sitting up, pt c/o dizziness and nausea. BP reassessed while lying supine in bed. 138/79. 02 sats 100% on room air. When dizziness subsided, pt completed supine to sit again with c/o that "the room was spinning." Pt was returned to bed, BP reassessed at 135/76. 02 sats stable. Nursing notified regarding pt c/o nausea and  dizziness. At end of session pt was agreeable to complete oral care tasks with [facility] raised to 50 degrees. Pt completed with Min-Mod A for closing containers. Pt was left in bed, repositioned for comfort, with 4 bedrails up and all needs within reach. Pt left with nursing upon skilled OT departure.   Therapy Documentation Precautions:  Precautions Precautions: Fall Restrictions Weight Bearing Restrictions: No    Pain: Pain in buttocks reported and nursing notified Pain Assessment Pain Assessment: Faces Faces Pain Scale: Hurts little more Pain Type: Acute pain Pain Location: Buttocks (ulcer) Pain Orientation: Right;Left Pain Descriptors / Indicators: Burning Pain Frequency: Occasional Pain Onset: With Activity Pain Intervention(s): Medication (See eMAR) ADL: ADL Upper Body Bathing: Moderate assistance Where Assessed-Upper Body Bathing: Sitting at sink Lower Body Bathing: Maximal assistance Where Assessed-Lower Body Bathing: Standing at sink Upper Body Dressing: Maximal assistance Where Assessed-Lower Body Dressing: Sitting at sink Toileting: Not assessed Toilet Transfer: Not assessed Tub/Shower Transfer: Not assessed     See Function Navigator for Current Functional Status.   Therapy/Group: Individual Therapy  Akeira Lahm A Tmya Wigington 11/27/2015, 12:41 PM

## 2015-11-27 NOTE — Progress Notes (Signed)
Patient has 3 separate stage II pressure wounds over sacral area already assessed. This morning during brief change, two areas of skin breakdown noted on the side, smaller than dime size. Unsure if related to pressure or folding of loose skin. Covered with foam. Educated patient's spouse about turn schedule and maintained Q2H turn schedule to reduce pressure. Educated spouse and patient about importance of protein. Will continue to monitor and pass to oncoming RN.

## 2015-11-27 NOTE — Progress Notes (Signed)
[facility] PHYSICAL MEDICINE & REHABILITATION     PROGRESS NOTE  Subjective/Complaints:  Pt laying in bed this AM. Husband is at bedside.  Pt slept well overnight and does not any concerns at present.    ROS: Denies CP, SOB, N/V/D.  Objective: Vital Signs: Blood pressure 132/73, pulse 95, temperature 98.2 F (36.8 C), temperature source Oral, resp. rate 18, height 5\' 4"  (1.626 m), weight 60.8 kg (134 lb), SpO2 98 %. No results found.  Recent Labs  11/26/15 1324  WBC 14.4*  HGB 9.5*  HCT 29.1*  PLT 193    Recent Labs  11/26/15 1324  NA 133*  K 3.0*  CL 109  GLUCOSE 126*  BUN 9  CREATININE 0.78  CALCIUM 7.1*   CBG (last 3)  No results for input(s): GLUCAP in the last 72 hours.  Wt Readings from Last 3 Encounters:  11/26/15 60.8 kg (134 lb)  11/23/15 60.6 kg (133 lb 11.2 oz)  10/31/15 58.6 kg (129 lb 3 oz)    Physical Exam:  BP 132/73 (BP Location: Right Arm)   Pulse 95   Temp 98.2 F (36.8 C) (Oral)   Resp 18   Ht 5\' 4"  (1.626 m)   Wt 60.8 kg (134 lb)   SpO2 98%   BMI 23.00 kg/m  Constitutional: Vital signs are normal. NAD. She appears cachectic. She has a sickly appearance.  HENT: Normocephalic and atraumatic.  Mouth/Throat: Oral lesions present.  Eyes: Conjunctivae and EOM are normal.  Cardiovascular: Regular rate and rhythm. No murmur heard. Respiratory: Effort normal and breath sounds normal. No stridor. No respiratory distress. She has no wheezes.  GI: Soft. Bowel sounds are normal. She exhibits no distension. There is no tenderness.  Musculoskeletal: She exhibits no edema or tenderness.  Muscle wasting all limbs.  Neurological: She appears listless.  Motor:  B/l UE 3/5 deltoid, biceps, triceps, 3+ wrist and HI.  B/l LE: 2+/5 HF, 2+ KE/KF and 3+/5 ADF/PF.  Skin: Skin is warm and dry. No rash noted. No erythema. Sacral ulcer not examined today Psychiatric: Her affect is blunt. She is slowed and withdrawn. Cognition and memory are impaired.    Assessment/Plan: 1. 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 Physiatrist is providing close team supervision and 24 hour management of active medical problems listed below. Physiatrist and rehab team continue to assess barriers to discharge/monitor patient progress toward functional and medical goals.  Function:  Bathing Bathing position   Position: Wheelchair/chair at sink  Bathing parts Body parts bathed by patient: Right arm, Left arm, Chest, Abdomen, Left upper leg, Right upper leg Body parts bathed by helper: Back, Buttocks, Right lower leg, Left lower leg  Bathing assist Assist Level: 2 helpers      Upper Body Dressing/Undressing Upper body dressing   What is the patient wearing?: Hospital gown                Upper body assist Assist Level: More than reasonable time, Touching or steadying assistance(Pt > 75%)      Lower Body Dressing/Undressing Lower body dressing   What is the patient wearing?: Non-skid slipper socks, Underwear   Underwear - Performed by helper: Thread/unthread right underwear leg, Thread/unthread left underwear leg, Pull underwear up/down       Non-skid slipper socks- Performed by helper: Don/doff right sock, Don/doff left sock  Lower body assist Assist for lower body dressing: 2 Helpers      Toileting Toileting Toileting activity did not occur: No continent bowel/bladder event        Toileting assist     Transfers Chair/bed transfer   Chair/bed transfer method: Stand pivot Chair/bed transfer assist level: Total assist (Pt < 25%) Chair/bed transfer assistive device: Mechanical lift Mechanical lift: Stedy   Locomotion Ambulation Ambulation activity did not occur: Safety/medical concerns         Wheelchair   Type: Manual Max wheelchair distance: 150 Assist Level: Dependent (Pt equals 0%)   Cognition Comprehension Comprehension assist level: Understands basic 50 - 74% of the time/ requires cueing 25 - 49% of the time  Expression Expression assist level: Expresses basic 50 - 74% of the time/requires cueing 25 - 49% of the time. Needs to repeat parts of sentences.  Social Interaction Social Interaction assist level: Interacts appropriately 50 - 74% of the time - May be physically or verbally inappropriate.  Problem Solving Problem solving assist level: Solves basic 50 - 74% of the time/requires cueing 25 - 49% of the time  Memory Memory assist level: Recognizes or recalls 50 - 74% of the time/requires cueing 25 - 49% of the time    Medical Problem List and Plan: 1.  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  DVT Prophylaxis/Anticoagulation: Mechanical: Sequential compression devices, below knee Bilateral lower extremities 3. Pain Management: Tylenol prn 4. Mood: LCSW to follow for evaluation and support.  5. Neuropsych: This patient is not fully capable of making decisions on her own behalf. 6. Skin/Wound Care: Ordered air mattress overlay due to malnutrition, poor intake and stage 2 sacral ulcer.  7. Fluids/Electrolytes/Nutrition:  Continue dextrose for now.   Soft diet at present, will advance as tolerated 8. HIV/AIDS with MAC?:  On descovy, ticovey, biaxin and ethambutol.   follow up per ID. Appreciate recs 9. Candida/CMV esophagitis: IV diflucan since 7/21 and On Ganciclovir since 08/03 10. FTT: Continue IVF for now. Started calorie count .   11. Pancytopenia:  Check labs - anticipate drop in numbers 5-7 days after Granix 12. Hyponatremia/Hypokalemia: Monitor lytes with Mg and Phos levels every 3-4 days.   Na+ 133 on 8/5: Will cont to monitor, treat if necessary  K+ 3.0 on 8/5: Will replete 13. PCP prophylaxis: Bactrim discontinued due to hyponatremia. Has been treated for PCP infection and dapsone resumed 8/4  for prophylaxis.   14. Hypoalbuminemia  Supplement started 8/6 15. Leukocytosis  WBCs 14.4 on 8/5 - currently afebrile without signs/symptoms of infection  Will cont to monitor 16. Anemia of chronic disease  Hb 9.5 on 8/5  Will cont to monitor  LOS (Days) 2 A FACE TO FACE EVALUATION WAS PERFORMED  Ankit Karis Juba 11/27/2015 8:30 AM

## 2015-11-28 ENCOUNTER — Inpatient Hospital Stay (HOSPITAL_COMMUNITY): Payer: Managed Care, Other (non HMO) | Admitting: Physical Therapy

## 2015-11-28 ENCOUNTER — Inpatient Hospital Stay (HOSPITAL_COMMUNITY): Payer: Managed Care, Other (non HMO) | Admitting: Occupational Therapy

## 2015-11-28 ENCOUNTER — Inpatient Hospital Stay (HOSPITAL_COMMUNITY): Payer: Managed Care, Other (non HMO) | Admitting: Speech Pathology

## 2015-11-28 LAB — BASIC METABOLIC PANEL
ANION GAP: 3 — AB (ref 5–15)
BUN: 8 mg/dL (ref 6–20)
CALCIUM: 7.3 mg/dL — AB (ref 8.9–10.3)
CO2: 24 mmol/L (ref 22–32)
CREATININE: 0.75 mg/dL (ref 0.44–1.00)
Chloride: 107 mmol/L (ref 101–111)
GFR calc Af Amer: >60 mL/min (ref >60)
GFR calc non Af Amer: >60 mL/min (ref >60)
Glucose, Bld: 97 mg/dL (ref 65–99)
Potassium: 3.7 mmol/L (ref 3.5–5.1)
SODIUM: 134 mmol/L — AB (ref 135–145)

## 2015-11-28 LAB — TROPONIN I

## 2015-11-28 MED ORDER — METOPROLOL TARTRATE 12.5 MG HALF TABLET
12.5000 mg | ORAL_TABLET | Freq: Two times a day (BID) | ORAL | Status: DC
  Administered 2015-11-28 – 2015-12-03 (×10): 12.5 mg via ORAL
  Filled 2015-11-28 (×11): qty 1

## 2015-11-28 MED ORDER — DIMETHICONE 1 % EX CREA
TOPICAL_CREAM | Freq: Three times a day (TID) | CUTANEOUS | Status: DC
  Administered 2015-11-28: 1 via TOPICAL
  Administered 2015-11-28 – 2015-11-30 (×6): via TOPICAL
  Administered 2015-12-01: 1 via TOPICAL
  Administered 2015-12-01: 20:00:00 via TOPICAL
  Administered 2015-12-01: 1 via TOPICAL
  Administered 2015-12-02 – 2015-12-05 (×11): via TOPICAL
  Administered 2015-12-06: 1 via TOPICAL
  Administered 2015-12-06 – 2015-12-09 (×8): via TOPICAL
  Administered 2015-12-10: 1 via TOPICAL
  Administered 2015-12-10 – 2015-12-12 (×6): via TOPICAL
  Administered 2015-12-12: 1 via TOPICAL
  Administered 2015-12-12 – 2015-12-23 (×31): via TOPICAL
  Filled 2015-11-28: qty 113

## 2015-11-28 MED ORDER — PRO-STAT SUGAR FREE PO LIQD
30.0000 mL | Freq: Every day | ORAL | Status: DC
  Administered 2015-11-28 – 2015-12-05 (×6): 30 mL via ORAL
  Filled 2015-11-28 (×7): qty 30

## 2015-11-28 MED ORDER — POTASSIUM CHLORIDE CRYS ER 20 MEQ PO TBCR: 20.0000 meq | EXTENDED_RELEASE_TABLET | Freq: Three times a day (TID) | ORAL | Status: DC

## 2015-11-28 MED ORDER — POTASSIUM CHLORIDE CRYS ER 20 MEQ PO TBCR
20.0000 meq | EXTENDED_RELEASE_TABLET | Freq: Two times a day (BID) | ORAL | Status: DC
  Administered 2015-11-28 – 2015-12-23 (×50): 20 meq via ORAL
  Filled 2015-11-28 (×52): qty 1

## 2015-11-28 MED ORDER — SODIUM CHLORIDE 0.9 % IV SOLN
INTRAVENOUS | Status: DC
  Administered 2015-11-28: 12:00:00 via INTRAVENOUS
  Filled 2015-11-28 (×4): qty 1000

## 2015-11-28 NOTE — Care Management Note (Signed)
[facility] Individual Statement of Services  Patient Name:  Samantha Kline  Date:  11/28/2015  Welcome to the [facility].  Our goal is to provide you with an individualized program based on your diagnosis and situation, designed to meet your specific needs.  With this comprehensive rehabilitation program, you will be expected to participate in at least 3 hours of rehabilitation therapies Monday-Friday, with modified therapy programming on the weekends.  Your rehabilitation program will include the following services:  Physical Therapy (PT), Occupational Therapy (OT), Speech Therapy (ST), 24 hour per day rehabilitation nursing, Therapeutic Recreaction (TR), Neuropsychology, Case Management (Social Worker), Rehabilitation Medicine, Nutrition Services and Pharmacy Services  Weekly team conferences will be held on Wednesday to discuss your progress.  Your Social Worker will talk with you frequently to get your input and to update you on team discussions.  Team conferences with you and your family in attendance may also be held.  Expected length of stay: 3-4 weeks  Overall anticipated outcome: min/mod level  Depending on your progress and recovery, your program may change. Your Social Worker will coordinate services and will keep you informed of any changes. Your Social Worker's name and contact numbers are listed  below.  The following services may also be recommended but are not provided by the [facility]patient [facility]:   Driving Evaluations  Home Health Rehabiltiation Services  Outpatient Rehabilitation Services    Arrangements will be made to provide these services after discharge if needed.  Arrangements include referral to agencies that provide these services.  Your insurance has been verified to be:  [employer] Your primary doctor is: Ninetta LightsSara Furr   Pertinent information will be shared with your doctor and your insurance company.  Social Worker:   Dossie DerBecky Giara Mcgaughey, SW [phone] or ([phone]  Information discussed with and copy given to patient by: Lucy Chrisupree, Francesa Eugenio G, 11/28/2015, 1:45 PM

## 2015-11-28 NOTE — Progress Notes (Signed)
Samantha Kline PHYSICAL MEDICINE & REHABILITATION     PROGRESS NOTE  Subjective/Complaints:  Pt states she slept "real good".  She states she will attempt to increase her appetite to improve her strength.   ROS: Denies CP, SOB, N/V/D.  Objective: Vital Signs: Blood pressure 120/69, pulse (!) 107, temperature 99.3 F (37.4 C), temperature source Oral, resp. rate 18, height 5\' 4"  (1.626 m), weight 63 kg (138 lb 14.6 oz), SpO2 94 %. No results found.  Recent Labs  11/26/15 1324  WBC 14.4*  HGB 9.5*  HCT 29.1*  PLT 193    Recent Labs  11/26/15 1324  NA 133*  K 3.0*  CL 109  GLUCOSE 126*  BUN 9  CREATININE 0.78  CALCIUM 7.1*   CBG (last 3)  No results for input(s): GLUCAP in the last 72 hours.  Wt Readings from Last 3 Encounters:  11/28/15 63 kg (138 lb 14.6 oz)  11/23/15 60.6 kg (133 lb 11.2 oz)  10/31/15 58.6 kg (129 lb 3 oz)    Physical Exam:  BP 120/69 (BP Location: Right Arm)   Pulse (!) 107   Temp 99.3 F (37.4 C) (Oral)   Resp 18   Ht 5\' 4"  (1.626 m)   Wt 63 kg (138 lb 14.6 oz)   SpO2 94%   BMI 23.84 kg/m  Constitutional: Vital signs are normal. NAD. She appears cachectic. She has a sickly appearance.  HENT: Normocephalic and atraumatic.  Mouth/Throat: Oral lesions present.  Eyes: Conjunctivae and EOM are normal.  Cardiovascular: Regular rate and rhythm. No murmur heard. Respiratory: Effort normal and breath sounds normal. No stridor. No respiratory distress. She has no wheezes.  GI: Soft. Bowel sounds are normal. She exhibits no distension. There is no tenderness.  Musculoskeletal: She exhibits no edema or tenderness.  Muscle wasting all limbs.  Neurological: She appears listless.  Motor:  B/l UE: 3/5 deltoid, biceps, triceps, 3+ wrist and HI.  B/l LE: 2+/5 HF, 2+ KE/KF and 3+/5 ADF/PF.  Skin: Skin is warm and dry. No rash noted. No erythema. Sacral ulcer not examined today Psychiatric: Her affect is blunt. She is slowed and withdrawn. Cognition and  memory are impaired.   Assessment/Plan: 1. 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 Physiatrist is providing close team supervision and 24 hour management of active medical problems listed below. Physiatrist and rehab team continue to assess barriers to discharge/monitor patient progress toward functional and medical goals.  Function:  Bathing Bathing position   Position: Wheelchair/chair at sink  Bathing parts Body parts bathed by patient: Right arm, Left arm, Chest, Abdomen, Left upper leg, Right upper leg Body parts bathed by helper: Back, Buttocks, Right lower leg, Left lower leg  Bathing assist Assist Level: 2 helpers      Upper Body Dressing/Undressing Upper body dressing   What is the patient wearing?: Hospital gown                Upper body assist Assist Level: More than reasonable time, Touching or steadying assistance(Pt > 75%)      Lower Body Dressing/Undressing Lower body dressing   What is the patient wearing?: Non-skid slipper socks, Underwear   Underwear - Performed by helper: Thread/unthread right underwear leg, Thread/unthread left underwear leg, Pull underwear up/down       Non-skid slipper socks- Performed by helper: Don/doff right sock, Don/doff left sock  Lower body assist Assist for lower body dressing: 2 Helpers      Toileting Toileting Toileting activity did not occur: No continent bowel/bladder event        Toileting assist     Transfers Chair/bed transfer   Chair/bed transfer method: Stand pivot Chair/bed transfer assist level: Total assist (Pt < 25%) Chair/bed transfer assistive device: Mechanical lift Mechanical lift: Stedy   Locomotion Ambulation Ambulation activity did not occur: Safety/medical concerns         Wheelchair   Type: Manual Max wheelchair distance: 150 Assist Level:  Dependent (Pt equals 0%)  Cognition Comprehension Comprehension assist level: Understands basic 50 - 74% of the time/ requires cueing 25 - 49% of the time  Expression Expression assist level: Expresses basic 50 - 74% of the time/requires cueing 25 - 49% of the time. Needs to repeat parts of sentences.  Social Interaction Social Interaction assist level: Interacts appropriately 50 - 74% of the time - May be physically or verbally inappropriate.  Problem Solving Problem solving assist level: Solves basic 25 - 49% of the time - needs direction more than half the time to initiate, plan or complete simple activities  Memory Memory assist level: Recognizes or recalls 25 - 49% of the time/requires cueing 50 - 75% of the time    Medical Problem List and Plan: 1.  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  DVT Prophylaxis/Anticoagulation: Mechanical: Sequential compression devices, below knee Bilateral lower extremities 3. Pain Management: Tylenol prn 4. Mood: LCSW to follow for evaluation and support.  5. Neuropsych: This patient is not fully capable of making decisions on her own behalf. 6. Skin/Wound Care: Ordered air mattress overlay due to malnutrition, poor intake and stage 2 sacral ulcer.  7. Fluids/Electrolytes/Nutrition:  Continue dextrose for now.   Soft diet at present, will advance as tolerated 8. HIV/AIDS with MAC?:  On descovy, ticovey, biaxin and ethambutol.   follow up per ID. Appreciate recs 9. Candida/CMV esophagitis: IV diflucan since 7/21 and On Ganciclovir since 08/03 10. FTT: Continue IVF for now. Started calorie count .   11. Pancytopenia:  Check labs - anticipate drop in numbers 5-7 days after Granix 12. Hyponatremia/Hypokalemia: Monitor lytes with Mg and Phos levels every 3-4 days.   Na+ 133 on 8/5: Will cont to monitor, treat if necessary  K+ 3.0 on 8/5: Repleted  Labs pending today 13. PCP prophylaxis: Bactrim discontinued due  to hyponatremia. Has been treated for PCP infection and dapsone resumed 8/4  for prophylaxis.  14. Hypoalbuminemia  Supplement started 8/6 15. Leukocytosis  WBCs 14.4 on 8/5 - currently afebrile without signs/symptoms of infection  Labs pending for today 16. Anemia of chronic disease  Hb 9.5 on 8/5  Labs pending for today  LOS (Days) 3 A FACE TO FACE EVALUATION WAS PERFORMED  Turkessa Ostrom Karis Juba 11/28/2015 9:18 AM

## 2015-11-28 NOTE — Anesthesia Postprocedure Evaluation (Signed)
Anesthesia Post Note  Patient: Samantha Kline  Procedure(s) Performed: Procedure(s) (LRB): ESOPHAGOGASTRODUODENOSCOPY (EGD) (N/A)  Patient location during evaluation: PACU Anesthesia Type: MAC Level of consciousness: awake and alert Pain management: pain level controlled Vital Signs Assessment: post-procedure vital signs reviewed and stable Respiratory status: spontaneous breathing, nonlabored ventilation, respiratory function stable and patient connected to nasal cannula oxygen Cardiovascular status: stable and blood pressure returned to baseline Anesthetic complications: no    Last Vitals:  Vitals:   11/25/15 0457 11/25/15 1351  BP: 126/81 128/78  Pulse: 99 (!) 48  Resp: 18   Temp: 36.8 C 37 C    Last Pain:  Vitals:   11/25/15 1351  TempSrc: Oral  PainSc:                  [name]

## 2015-11-28 NOTE — Progress Notes (Signed)
RN called to room in patient's morning therapy for complaints of chest pain. Pt is found to be hypertensive and HR is 121. RN contacted Delle ReiningPamela Love, PA and notified her of patient's complaints. PA ordered stat EKG. RN completed EKG and put patient on 2 L of O2 via Nasal Cannula. After relaxation attempts patient says that chest feels "okay". RN repeated blood pressure check with blood pressure down to 149/91. Labs ordered. Will continue to monitor.

## 2015-11-28 NOTE — Progress Notes (Addendum)
Calorie Count Note  48 hour calorie count ordered.  Diet: Soft diet with thin liquids Supplements:   Ensure Enlive po 4 times daily, each supplement provides 350 kcal and 20 grams of protein  Magic cup po once daily, each supplement provides 290 kcal and 9 grams of protein  Day 1: Breakfast: no meal ticket provided however po 5% Lunch: 15 kcal and 1 gram of protein Dinner: 23 kcal, 1 gram of protein Supplements: 700 kcal, and 40 grams of protein  Day 1 Total intake: 738 kcal (43% of minimum estimated needs)  42 grams of protein (50% of minimum estimated needs)  Day 2: Breakfast: 62 kcal, 2 grams of protein Lunch: 61 kcal, 5 grams of protein Dinner: no meal ticket provided, however po 25% Supplements: 973 kcal, 55 grams of protein  Day 2 Total intake: 1096 kcal (65% of minimum estimated needs)  62 grams of protein (74% of minimum estimated needs)  Estimated Nutritional Needs:  Kcal:  1700-1900  Protein:  84-96 g  Fluid:  >1.8 liters   Meal completion at meals have been 5-25%. Pt would only consume a couple of bites at meals. Per RN, pt too fatigued to eat more at meals. Most caloric and protein needs are coming from nutritional supplements. Pt at times would only take a couple of sips of Ensure, however most times would consume at least 75% of the protein shake. RD to continue with nutritional supplements. RD to additionally order Prostat to aid in protein needs. Noted, pt has refused enteral nutrition in the past. If pt consumes most of her nutritional supplements, nutrition may be adequate. Pt encouraged to eat her food at meals and to drink her supplements.   Nutrition Dx:  Inadequate oral intake related to lethargy/confusion, poor appetite as evidenced by meal completion < 25%; ongoing  Goal:  Pt to meet >/= 90% of their estimated nutrition needs; not met   Intervention:   Discontinue calorie count.   Continue Ensure Enlive po 4 times daily, each supplement provides  350 kcal and 20 grams of protein  Provide Magic cup between meals, each supplement provides 290 kcal and 9 grams of protein.  Provide nourishment snacks between meals. (Ordered)  Continue Beneprotein (1 scoop) TID at meals, each scoop provides 25 kcal and 6 grams of protein.   Provide 30 ml Prostat po once daily, each supplement provides 100 kcal and 15 grams of protein.   Encourage adequate PO intake.   RD to continue to monitor.   Corrin Parker, MS, RD, LDN Pager # [phone] After hours/ weekend pager # [phone]

## 2015-11-28 NOTE — Progress Notes (Signed)
Patient information reviewed and entered into eRehab system by Kyen Taite, RN, CRRN, PPS Coordinator.  Information including medical coding and functional independence measure will be reviewed and updated through discharge.    

## 2015-11-28 NOTE — Progress Notes (Signed)
Samantha Karis Juba, MD Physician Signed Physical Medicine and Rehabilitation  Consult Note Date of Service: 11/09/2015 10:00 AM  Related encounter: ED to Hosp-Admission (Discharged) from 11/06/2015 in [facility] MEDICAL     Expand All Collapse All        Physical Medicine and Rehabilitation Consult  Reason for Consult: Debility  Referring Physician:  Dr. Elisabeth Pigeon.    HPI: Samantha Kline is a 55 y.o. female with unintentional 50 lbs weight loss over past few months, anorexia, SOB with progressive weakness. She was admitted 07/03-7/10/17 with fever due to PNA and diagnosed with HIV. She was discharged to home on antiviral therapy but continued to have malaise, N/V with poor oral intake and inability to walk. She was readmitted on 07/17 with lethargy, confusion and weakness due to hyponatremia with Na- 110.  She was treated with fluid boluses and kayexalate for management of hyperkalemia.  CT head negative for infection or acute changes. Mentation improving with rise in Na-120 but she continues to have issues with hypotension, weakness and cognitive deficits with delayed processing and poor attention. Therapy evaluations done and CIR recommended for follow up therapy.   Has been declining for months. Husband works days but she has 3 local daughter in laws who have been checking in and helping out at home.   Review of Systems  Constitutional: Positive for weight loss and malaise/fatigue.       Decrease in appetite  HENT: Negative for hearing loss.   Eyes: Positive for blurred vision.  Cardiovascular: Negative for chest pain and palpitations.  Gastrointestinal: Negative for nausea, abdominal pain and constipation.       Sore throat due to thrush  Genitourinary: Negative for dysuria.  Musculoskeletal: Positive for myalgias and back pain.  Skin: Negative for itching.  Neurological: Positive for weakness. Negative for dizziness, tingling and headaches.  All other systems  reviewed and are negative.          Past Medical History  Diagnosis Date  . Anemia   . Vitamin D deficiency   . Pneumonia 10/24/2015  . HIV (human immunodeficiency virus infection) (HCC)     11/06/15 Family states it was just diagnosed this week         Past Surgical History  Procedure Laterality Date  . Tubal ligation  1980s         Family History  Problem Relation Age of Onset  . Diabetes Father   . Heart attack Brother   . Hypertension Sister     Social History:  Married.  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  She reports that she quit smoking in 1980's.  She has never used smokeless tobacco. She reports that she drinks alcohol. She reports that she does not use illicit drugs.   Allergies: No Known Allergies         Medications Prior to Admission  Medication Sig Dispense Refill  . azithromycin (ZITHROMAX) 600 MG tablet Take 2 tablets (1,200 mg total) by mouth once a week. (Patient taking differently: Take 1,200 mg by mouth every Friday. ) 4 tablet 6  . dolutegravir (TIVICAY) 50 MG tablet Take 1 tablet (50 mg total) by mouth daily. (Patient taking differently: Take 50 mg by mouth daily at 12 noon. ) 30 tablet 3  . emtricitabine-tenofovir AF (DESCOVY) 200-25 MG tablet Take 1 tablet by mouth daily. (Patient taking differently: Take 1 tablet by mouth daily at 12 noon. ) 30 tablet 3  . ferrous  sulfate 325 (65 FE) MG tablet Take 325 mg by mouth daily with breakfast.    . fluconazole (DIFLUCAN) 100 MG tablet Take 1 tablet (100 mg total) by mouth once a week. (Patient taking differently: Take 100 mg by mouth every Friday. ) 4 tablet 6  . predniSONE (DELTASONE) 20 MG tablet Take 40 mg PO q daily for 5 days, followed by 30 mg PO q daily for 11 days. Qty Sufficient (Patient taking differently: Take 30-40 mg by mouth daily at 12 noon. Take 2 tablets (40 mg) by mouth daily at noon for 5 days, then take 1 1/2 tablets (30 mg) daily for 11 days,  then stop) 40 tablet 0  . sulfamethoxazole-trimethoprim (BACTRIM DS,SEPTRA DS) 800-160 MG tablet 2 tabs PO TID x 16 days, then 1 tab PO q daily thereafter Qty Sufficient (Patient taking differently: Take 1-23 tablets by mouth See admin instructions. Take 2 tablets by mouth 3 times daily for 16 days, then take 1 tablet daily.) 36 tablet 0  . vitamin B-12 (CYANOCOBALAMIN) 100 MCG tablet Take 100 mcg by mouth daily.    . Vitamin D, Ergocalciferol, (DRISDOL) 50000 units CAPS capsule Take 50,000 Units by mouth every Friday.       Home: Home Living Family/patient expects to be discharged to:: Private residence Living Arrangements: Spouse/significant other Available Help at Discharge: Family, Available 24 hours/day Type of Home: House Home Access: Stairs to enter Secretary/administrator of Steps: 2 Entrance Stairs-Rails: None Home Layout: Multi-level Alternate Level Stairs-Number of Steps: 5 up and 5 down Alternate Level Stairs-Rails: Left Bathroom Shower/Tub: Engineer, manufacturing systems: Standard Home Equipment: Bedside commode, Environmental consultant - 2 wheels  Functional History: Prior Function Level of Independence: Needs assistance Gait / Transfers Assistance Needed: walked 100 ft minguard with RW on recent admission; at home walked with minguard assist, no device, no furniture walking ADL's / Homemaking Assistance Needed: Pt indicates spouse was assisting her with ADLs PTA  Functional Status:  Mobility: Bed Mobility Overal bed mobility: Needs Assistance Bed Mobility: Rolling, Sidelying to Sit Rolling: Supervision Sidelying to sit: HOB elevated, Min assist (with rail) Sit to sidelying: Max assist General bed mobility comments: slow processing, incr time Transfers Overall transfer level: Needs assistance Equipment used: Rolling walker (2 wheeled), None Transfers: Sit to/from Stand, [employer] Transfers Sit to Stand: Min assist, +2 physical assistance, +2 safety/equipment Stand pivot  transfers: Mod assist, +2 safety/equipment General transfer comment: pivotal steps to chair with posterior lean; vc for safe use of RW to stand from chair; assist for balance Ambulation/Gait Ambulation/Gait assistance: Min assist, Mod assist, +2 safety/equipment Ambulation Distance (Feet): 40 Feet Assistive device: Rolling walker (2 wheeled) Gait Pattern/deviations: Step-through pattern, Decreased stride length, Trunk flexed General Gait Details: first 20 ft much more steady and managing RW with vc only; as she fatigued, she required incr assist for balance and to manage RW Gait velocity: very slow pattern    ADL: ADL Overall ADL's : Needs assistance/impaired Eating/Feeding: Moderate assistance, Sitting Grooming: Wash/dry hands, Wash/dry face, Minimal assistance, Bed level Upper Body Bathing: Total assistance, Bed level Lower Body Bathing: Total assistance, Bed level Upper Body Dressing : Total assistance, Bed level Lower Body Dressing: Total assistance, Bed level Toilet Transfer: Total assistance Toilet Transfer Details (indicate cue type and reason): unable to safely attempt  Toileting- Clothing Manipulation and Hygiene: Total assistance, Bed level Toileting - Clothing Manipulation Details (indicate cue type and reason): Pt incontinent of urine when moving toward EOB.  Assisted with peri care while  supine  Functional mobility during ADLs: Maximal assistance General ADL Comments: Pt with focused to brief periods of sustained attention   Cognition: Cognition Overall Cognitive Status: Impaired/Different from baseline Orientation Level: Oriented X4 Cognition Arousal/Alertness: Awake/alert (slow processing, but alert) Behavior During Therapy: Flat affect Overall Cognitive Status: Impaired/Different from baseline Area of Impairment: Orientation, Attention, Following commands, Problem solving Orientation Level: Disoriented to, Time, Situation Current Attention Level:  Selective Following Commands: Follows one step commands with increased time, Follows one step commands consistently Safety/Judgement: Decreased awareness of safety Problem Solving: Slow processing, Decreased initiation, Difficulty sequencing, Requires verbal cues, Requires tactile cues General Comments: Pt very slow to initiate activity.  Requires step by step cues to sequence task    Blood pressure 125/88, pulse 86, temperature 98.3 F (36.8 C), temperature source Oral, resp. rate 17, height  (1.626 m), weight 52.753 kg (116 lb 4.8 oz), SpO2 97 %. Physical Exam  Nursing note and vitals reviewed. Constitutional: She is oriented to person, place, and time. She appears listless. She has a sickly appearance. No distress.  HENT:  Head: Normocephalic and atraumatic.  Eyes: Conjunctivae are normal. Pupils are equal, round, and reactive to light.  Neck: Normal range of motion. Neck supple.  Cardiovascular: Normal rate and regular rhythm.   Respiratory: Effort normal. No accessory muscle usage or stridor. No respiratory distress. She has decreased breath sounds in the right lower field and the left lower field.  GI: Soft. Bowel sounds are normal. She exhibits no distension. There is no tenderness.  Musculoskeletal: She exhibits no edema or tenderness.  Neurological: She is oriented to person, place, and time. She appears listless.  Speaks in monotone with flat affect.  She was able to follow 2 step commands and interacted appropriately once engaged. Sensation intact to light touch Motor: 4+/5 throughout DTRs symmetric  Skin: Skin is warm and dry. Rash (petechial rash on bilateral shins.) noted. She is not diaphoretic. No erythema.  Psychiatric: Her affect is blunt. Her speech is delayed. She is slowed. She exhibits a depressed mood.    Lab Results Last 24 Hours       Results for orders placed or performed during the hospital encounter of 11/06/15 (from the past 24 hour(s))   Osmolality     Status: Abnormal   Collection Time: 11/08/15 12:29 PM  Result Value Ref Range   Osmolality 251 (L) 275 - 295 mOsm/kg  CBC     Status: Abnormal   Collection Time: 11/09/15  3:14 AM  Result Value Ref Range   WBC 3.1 (L) 4.0 - 10.5 K/uL   RBC 3.21 (L) 3.87 - 5.11 MIL/uL   Hemoglobin 9.9 (L) 12.0 - 15.0 g/dL   HCT 16.1 (L) 09.6 - 04.5 %   MCV 85.0 78.0 - 100.0 fL   MCH 30.8 26.0 - 34.0 pg   MCHC 36.3 (H) 30.0 - 36.0 g/dL   RDW 40.9 (H) 81.1 - 91.4 %   Platelets 150 150 - 400 K/uL  Basic metabolic panel     Status: Abnormal   Collection Time: 11/09/15  3:14 AM  Result Value Ref Range   Sodium 120 (L) 135 - 145 mmol/L   Potassium 4.3 3.5 - 5.1 mmol/L   Chloride 94 (L) 101 - 111 mmol/L   CO2 21 (L) 22 - 32 mmol/L   Glucose, Bld 146 (H) 65 - 99 mg/dL   BUN 11 6 - 20 mg/dL   Creatinine, Ser 7.82 0.44 - 1.00 mg/dL   Calcium  8.0 (L) 8.9 - 10.3 mg/dL   GFR calc non Af Amer >60 >60 mL/min   GFR calc Af Amer >60 >60 mL/min   Anion gap 5 5 - 15     Imaging Results (Last 48 hours)  No results found.    Assessment/Plan: Diagnosis: Debility  Labs and images independently reviewed.  Records reviewed and summated above.  1. Does the need for close, 24 hr/day medical supervision in concert with the patient's rehab needs make it unreasonable for this patient to be served in a less intensive setting? Yes  2. Co-Morbidities requiring supervision/potential complications: Hyponatremia (cont to monitor, treat if necessary), hyperkalemia (cont to monitor, treat if necessary), hypotension (cont to monitor, treat as necessary), delayed processing and poor attention, tachypnea (monitor RR and O2 Sats with increased physical exertion), Tachycardia (monitor in accordance with pain and increasing activity), anemia of chronic disease (transfuse if necessary to ensure appropriate perfusion for increased activity tolerance), HIV (cont meds) 3. Due to safety,  skin/wound care, disease management and patient education, does the patient require 24 hr/day rehab nursing? Yes 4. Does the patient require coordinated care of a physician, rehab nurse, PT (1-2 hrs/day, 5 days/week), OT (1-2 hrs/day, 5 days/week) and SLP (1-2 hrs/day, 5 days/week) to address physical and functional deficits in the context of the above medical diagnosis(es)? Yes Addressing deficits in the following areas: balance, endurance, locomotion, strength, transferring, toileting, swallowing and psychosocial support 5. Can the patient actively participate in an intensive therapy program of at least 3 hrs of therapy per day at least 5 days per week? Yes 6. The potential for patient to make measurable gains while on inpatient rehab is excellent 7. Anticipated functional outcomes upon discharge from inpatient rehab are modified independent and supervision  with PT, modified independent and supervision with OT, independent and modified independent with SLP. 8. Estimated rehab length of stay to reach the above functional goals is: 12-16 days. 9. Does the patient have adequate social supports and living environment to accommodate these discharge functional goals? Yes 10. Anticipated D/C setting: Home 11. Anticipated post D/C treatments: HH therapy and Home excercise program 12. Overall Rehab/Functional Prognosis: good  RECOMMENDATIONS: This patient's condition is appropriate for continued rehabilitative care in the following setting: Pt would benefit from CIR, however, pt and wife believe pt is close to her baseline and [facility] and is also concerned about the cost of CIR.  [facility], would recommend close follow up. Patient has agreed to participate in recommended program. No Note that insurance prior authorization may be required for reimbursement for recommended care.  Comment: Rehab Admissions Coordinator to follow up.  Samantha MorrowAnkit  Patel, MD 11/09/2015     Revision History                             Routing History

## 2015-11-28 NOTE — Progress Notes (Signed)
Physical Therapy Session Note  Patient Details  Name: Samantha Kline MRN: [id number] Date of Birth: 1960/10/21  Today's Date: 11/28/2015   Pt missed 90 minutes skilled PT treatment on this date 2/2 RN requesting PT hold due to medical issues.  Will follow up per plan of care.  General: PT Amount of Missed Time (min): 90 Minutes PT Missed Treatment Reason:  (hold PT treatment per RN)   Therapy/Group: Individual Therapy  Sandi MariscalVictoria M Derion Kreiter 11/28/2015, 10:45 AM

## 2015-11-28 NOTE — Progress Notes (Signed)
Speech Language Pathology Daily Session Note  Patient Details  Name: Samantha Kline MRN: [id number] Date of Birth: 1960/10/02  Today's Date: 11/28/2015 SLP Individual Time: 1405-1450 SLP Individual Time Calculation (min): 45 min   Short Term Goals: Week 1: SLP Short Term Goal 1 (Week 1): Patient will initiate self-care tasks in under 30 seconds with no more than 2 verbal cues  SLP Short Term Goal 2 (Week 1): Patient will initiate verbal expression of basic wants and needs with Mod question cues  SLP Short Term Goal 3 (Week 1): Patient will problem solve use of call bell with Mod assist verbal and visual cues  SLP Short Term Goal 4 (Week 1): Patient will utilize external aids to recall daily information with Mod assist cues SLP Short Term Goal 5 (Week 1): Patient will demonstrate sustained attention to familiar tasks for 5 minutes with Mod verbal cues for redirection  SLP Short Term Goal 6 (Week 1): Patient will demonstrate effective mastication and oral clearance of soft solid textures with Mod verbal cues  Skilled Therapeutic Interventions:  Pt was seen for skilled ST targeting cognitive goals.  Pt with impaired orientation to date, therefore therapist provided pt with a calendar for her room.  Pt able to utilize calendar to recall today's date with min assist verbal cues for sustained attention to task.  Pt required min assist verbal cues following initial instruction to return demonstration of use of call bell.  Pt required mod assist verbal cues to identify that she needed to call for assistance if she wanted to get up out of bed.  Pt was encouraged to sit up in bed versus laying supine to maximize participation in therapies but politely declined due to fatigue.  As a result, session was ended early.  Continue per current plan of care.   Function:  Eating Eating                 Cognition Comprehension Comprehension assist level: Understands basic 50 - 74% of the time/ requires cueing  25 - 49% of the time  Expression   Expression assist level: Expresses basic 50 - 74% of the time/requires cueing 25 - 49% of the time. Needs to repeat parts of sentences.  Social Interaction Social Interaction assist level: Interacts appropriately 50 - 74% of the time - May be physically or verbally inappropriate.  Problem Solving Problem solving assist level: Solves basic 25 - 49% of the time - needs direction more than half the time to initiate, plan or complete simple activities  Memory Memory assist level: Recognizes or recalls 25 - 49% of the time/requires cueing 50 - 75% of the time    Pain Pain Assessment Pain Assessment: No/denies pain   Therapy/Group: Individual Therapy  Rozann Holts, [name] 11/28/2015, 4:00 PM

## 2015-11-28 NOTE — Progress Notes (Signed)
Social Work Assessment and Plan Social Work Assessment and Plan  Patient Details  Name: Samantha Kline MRN: [id number] Date of Birth: April 01, 1961  Today's Date: 11/28/2015  Problem List:  Patient Active Problem List   Diagnosis Date Noted  . Leukocytosis   . HIV encephalopathy (HCC)   . Benign essential HTN   . Dysphagia   . Antineoplastic chemotherapy induced pancytopenia (HCC)   . Drug-induced folate deficiency anemia   . Physical debility 11/25/2015  . Esophageal ulcer   . Malnutrition of moderate degree 11/22/2015  . Anemia due to bone marrow failure (HCC)   . Cytomegalovirus (CMV) viremia (HCC)   . Esophagitis, CMV   . FTT (failure to thrive) in adult   . Physical deconditioning   . AIDS (HCC)   . Candida esophagitis (HCC)   . FUO (fever of unknown origin)   . Arterial hypotension   . Tachypnea   . Tachycardia   . Anemia of chronic disease   . Hyperkalemia 11/07/2015  . Nausea & vomiting 11/07/2015  . Pressure ulcer 11/07/2015  . Elevated lactic acid level 11/07/2015  . Sepsis (HCC)   . Acute encephalopathy 11/06/2015  . Hyponatremia 11/06/2015  . Bilateral pneumonia   . Protein-calorie malnutrition, severe (HCC) 10/28/2015  . CAP (community acquired pneumonia) 10/27/2015  . Human immunodeficiency virus (HIV) disease (HCC)   . Acute hypoxemic respiratory failure (HCC) 10/25/2015  . Hypokalemia   . 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 Anemia 10/24/2015  . Weight loss    Past Medical History:  Past Medical History:  Diagnosis Date  . Anemia   . HIV (human immunodeficiency virus infection) (HCC)    11/06/15 Family states it was just diagnosed this week  . Pneumonia 10/24/2015  . Vitamin D deficiency    Past Surgical History:  Past Surgical History:  Procedure Laterality Date  . ESOPHAGOGASTRODUODENOSCOPY N/A 11/23/2015   Procedure: ESOPHAGOGASTRODUODENOSCOPY (EGD);  Surgeon: Vida RiggerMarc Magod, MD;  [facility];   Service: Endoscopy;  Laterality: N/A;  . TUBAL LIGATION  1980s   Social History:  reports that she has never smoked. She has never used smokeless tobacco. She reports that she drinks alcohol. She reports that she does not use drugs.  Family / Support Systems Marital Status: Married Patient Roles: Spouse, Parent Spouse/Significant Other: Greig Castillandrew  [phone]  [phone] Children: Marcus-son [phone] Other Supports: Another local son Anticipated Caregiver: Husband and children along with daughter in-laws Ability/Limitations of Caregiver: husbnand works 3 am-2 pm truck drive and on the docks Caregiver Availability: 24/7 Family Dynamics: Close knit with son's and their families, both have young children who keep them busy. One son has four children and the other has 2 teenagers. All are local and very involved in each other's lives.  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 Will look toward her husband if any decision needs to be made while here.    Abuse/Neglect Physical Abuse: Denies Verbal Abuse: Denies Sexual Abuse: Denies Exploitation of patient/patient's resources: Denies Self-Neglect: Denies  Emotional Status Pt's affect, behavior adn adjustment status: Pt is trying to do better each day and wants to participate in her therapies but just doesn't feel well to do this. When  she tries she becomes dizzy and nauseaous. She does like th air overlay bed reports it feels better on her body. She has been one who has always been independent and doesn't like to rely upon others. Recent Psychosocial Issues: recent health  issues-a few months didn't know what was wrong with her and now this-multiple admissions to the hospitals Pyschiatric History: No history deferred depression screen due to pt not feeling well. But she would benefit from seeing neuro-psych while here for coping, will make referral. Substance Abuse History: Pt reports no history  Patient / Family Perceptions, Expectations & Goals Pt/Family understanding of illness & functional limitations: Pt knows she has been sick but has mutiple issues, which MD's are addressing. She knows she needs to get stronger and try to eat to help this, but doesn't feel well and it is painful to try. She expects to do better and get stronger while here. Premorbid pt/family roles/activities: Wife, mother, grandmother, neighbor, friend, church member,e tc Anticipated changes in roles/activities/participation: resume Pt/family expectations/goals: Pt states: " I need to do better so I can go home, that is where I want to be."  Husband states: " I want her home but to be better than this."  [employer]: None Premorbid Home Care/DME Agencies: Other (Comment) (Was not home long enough to see home health before re-admitted) Transportation available at discharge: Family Resource referrals recommended: Neuropsychology, Support group (specify)  Discharge Planning Living Arrangements: Spouse/significant other Support Systems: Spouse/significant other, Children, Friends/neighbors, Church/faith community Type of Residence: Private residence Insurance Resources: Media planner (specify) Counselling psychologist) Financial Resources: Family Support Financial Screen Referred: No Living Expenses: Database administrator Management: Spouse, Patient Does the patient have any problems obtaining your medications?: No Home Management: Patient did prior to becoming ill Patient/Family Preliminary Plans: Return home with the assistance of husband, sons and daughter in-laws, all aware she  will need 24 hr care upon discharge from rehab. Husband prior to admission to the hospital was carrying her from place to place and up and down the stairs, since she could not walk.  Social Work Anticipated Follow Up Needs: HH/OP, Support Group  Clinical Impression Gravely ill patient who is hoping she will get better and go home soon. Her husband and children are involved and will assist at discharge. Husband has ben providing total care prior to admission to the hospital. Have not addressed 042 diagnosis with both yet want to build a rapport first. Will work on a safe discharge plan and one that will not hurt her husband's back. May need palliative care consulted for goals of care. Will see how pt does and if able to participate in therapies this program may be too much for her. Will make referral for neuro-psych to see while here.  Lucy Chris 11/28/2015, 3:40 PM

## 2015-11-28 NOTE — Progress Notes (Signed)
Pharmacy Antibiotic Note  Amenah Manson PasseyBrown is a 55 y.o.Charolett Bumpers female admitted on 11/25/2015 with CMV.  Pt has been transferred to Rehab and Pharmacy has been consulted for continued Ganciclovir dosing. Renal function stable, scr 0.75, est. crcl ~ 65-70 ml/min. hgb 9.5, pltc 193K, up, wbc 14.4K  Plan: Continue Ganciclovir 150 mg (2.5mg /kg) IV q12 Monitor CBC  Height: 5\' 4"  (162.6 cm) Weight: 138 lb 14.6 oz (63 kg) IBW/kg (Calculated) : 54.7  Temp (24hrs), Avg:98.8 F (37.1 C), Min:98.2 F (36.8 C), Max:99.3 F (37.4 C)   Recent Labs Lab 11/23/15 0928 11/24/15 0454 11/26/15 1324 11/28/15 1027  WBC  --  10.3 14.4*  --   CREATININE 0.71 0.61 0.78 0.75    Estimated Creatinine Clearance: 67.8 mL/min (by C-G formula based on SCr of 0.8 mg/dL).    Allergies  Allergen Reactions  . Septra [Sulfamethoxazole-Trimethoprim]     Possible cause of hyponatremia    Antimicrobials this admission: -Fluconzaole 400 mg for candidal thrush- continue IV for now per GI/ID. 21 days planned. 7/21>> (8/10) -Clarithromycin/Ethambutol empirically for MAI: 7/27>> -Gancyclovir 8/2 >>   Dose adjustments this admission: n/a  Microbiology results: 7/16 BC, UC neg - 7/19 MRSA PCR negative - 7/21 BCx: neg - 7/21 Acid fast smear : In process  7/25 Chest CT: Improved aeration of the lungs with residual ill-defined tree-in-bud opacities with bilateral lung bases- No new or worsening focal airspace opacities.  -7/27 BCx x2: neg -7/29 CMV Ab + -  Thank you for allowing pharmacy to be a part of this patient's care.  Bayard HuggerMei Camry Robello, PharmD, BCPS  Clinical Pharmacist  Pager: [phone]

## 2015-11-28 NOTE — Progress Notes (Signed)
Occupational Therapy Session Note  Patient Details  Name: Samantha Kline MRN: [id number] Date of Birth: 07/03/1960  Today's Date: 11/28/2015 OT Individual Time: [id number] OT Individual Time Calculation (min): 73 min     Short Term Goals: Week 1:  OT Short Term Goal 1 (Week 1): Pt will complete functional toilet transfer with Max A OT Short Term Goal 2 (Week 1): Pt will complete UB bathing with Min A OT Short Term Goal 3 (Week 1): Pt will complete sit to stand for LB bathing with Max A OT Short Term Goal 4 (Week 1): Pt will engage in 5 minutes of therapeutic activity without rest to increase activity tolerance for self care completion   Skilled Therapeutic Interventions/Progress Updates:    Pt greeted in room with RN present. Pt incontinent of bowel and bladder requiring Max A for rolling in bed and dependent for hygiene. Pt required total assist to don pants and new brief in bed. Once clean, pt completed supine>sit w/ Max assist. Pt reported feeling dizzy in sitting (BP 101/58). Total assist for squat-pivot from bed<>w/c. Pt reported episode of bladder incontinence in w/c but declined transferred to the bathroom using stedy 2/2 max fatigue per pt. Pt washed her face with set-up and supervision after resting. Squat-pivot transfer completed back to bed, diaper changed, and peri-care completed dependently by OT . Pt then reported onset of chest pain (7/10). RN notified and pt left in care of RN . Therapy Documentation Precautions:  Precautions Precautions: Fall Restrictions Weight Bearing Restrictions: No  Vital Signs: Therapy Vitals Pulse Rate: (!) 120 Resp: 18 BP: (!) 151/102 Patient Position (if appropriate): Orthostatic Vitals Oxygen Therapy SpO2: 100 % O2 Device: Not Delivered Pain: Pain Assessment Pain Assessment: 0-10 Pain Score: 7  Pain Type: Acute pain Pain Location: Chest Pain Orientation: Mid Pain Descriptors / Indicators: Aching;Dull Pain Intervention(s): RN made  aware;Rest   See Function Navigator for Current Functional Status.   Therapy/Group: Individual Therapy  Samantha Kline 11/28/2015, 3:34 PM

## 2015-11-28 NOTE — Progress Notes (Signed)
Samantha Mage, RN Rehab Admission Coordinator Signed Physical Medicine and Rehabilitation  PMR Pre-admission Date of Service: 11/13/2015 3:25 PM  Related encounter: ED to Hosp-Admission (Discharged) from 11/06/2015 in [facility] 5W MEDICAL       [] Hide copied text PMR Admission Coordinator Pre-Admission Assessment  Patient: Samantha Kline is an 55 y.o., female MRN: [id number] DOB: 1961-02-02 Height: 5\' 4"  (162.6 cm) Weight: 60.6 kg (133 lb 11.2 oz)                                                                                                                                                                                                                                                                          Insurance Information HMO:     PPO:      PCP:      IPA:      80/20:      OTHER: open access plus PRIMARY: [employer]      Policy#: [id number]      Subscriber: spouse CM Name: Samantha Kline for Samantha Kline      Phone#: [phone] ext [phone]  Fax#: [phone] Pre-Cert#: [id number] with updates due on 12/01/15       Employer: spouse approved for 7 days Benefits:  Phone #: [phone]     Name: 11/11/15 Eff. Date: 04/23/10     Deduct: $500      Out of Pocket Max: $5000      Life Max: none CIR: $300 copay per admission then covers 80%      SNF: 80% 100 days Outpatient: $50 copay per admit     Co-Pay: 60 visits combined Home Health: 80%      Co-Pay: 60 visits combined DME: 80%     Co-Pay: 20% Providers: in network  SECONDARY: none        Medicaid Application Date:       Case Manager:  Disability Application Date:       Case Worker:   Emergency Actuary Information    Name Relation Home Work [place] Spouse [phone]  [phone]   Lark, Langenfeld   [phone]     Current Medical History  Patient Admitting Diagnosis:  Debility  History of Present Illness: A 55 y.o.femalewith unintentional 50 lbs weight loss  over past few months, anorexia, SOB with progressive weakness. She was admitted 07/03-7/10/17 with fever due to PCP PNA and diagnosed with HIV. She was discharged to home on antiviral therapy but continued to have malaise, N/V with poor oral intake and inability to walk. She was readmitted on 07/17 with lethargy, confusion and weakness due to hyponatremia with Na- 110. She was treated with fluid boluses and kayexalate for management of hyperkalemia. CT head negative for infection or acute changes. Mentation improving and she has had improvement in electrolyte abnormality but decrease in ability to eat due to dysphagia. She was started on IV flucanazole for candida esophagitis. She spiked temp of 103.4 on 07/29 and was started on empiric treatment for MAC with resolution of low grade fevers. She has had decrease in viral load from 200K to 200 with increase in white count and concerns about risk ofIRIS.  She underwent endoscopy (Dr. Ewing Kline) with biopsies to rule out CMV/HSV on 8/02 due to continued odynophagia. EGD showed ulcers and high CMV viral load. Pancytopenia treated Granix and she was started on Gancyclovir due to high likelihood of CMV esophagitis. She has had improvement in WBC from 1.6-->10.3 and platelets from 115-->195. She continues to have poor po intake but has refused feeding tube or PEG. Hyponatremia improving with electrolyte abnormalities being treated as needed. She continues to be Debilitated but showing increase in voice volume and stabilization of medical status. Therapy ongoing and CIR recommended for follow up therapy.     Past Medical History      Past Medical History:  Diagnosis Date  . Anemia   . HIV (human immunodeficiency virus infection) (HCC)    11/06/15 Family states it was just diagnosed this week  . Pneumonia 10/24/2015  . Vitamin D deficiency     Family History  family history includes Diabetes in her father; Heart attack in her brother; Hypertension  in her sister.  Prior Rehab/Hospitalizations:  Has the patient had major surgery during 100 days prior to admission? No  Current Medications   Current Facility-Administered Medications:  .  0.9 %  sodium chloride infusion, , Intravenous, Continuous, Vida RiggerMarc Magod, MD, Last Rate: 20 mL/hr at 11/23/15 1719 .  acetaminophen (TYLENOL) tablet 650 mg, 650 mg, Oral, Q4H PRN, 650 mg at 11/20/15 1346 **OR** acetaminophen (TYLENOL) suppository 650 mg, 650 mg, Rectal, Q4H PRN, Jinger NeighborsMary A Lynch, NP .  albuterol (PROVENTIL) (2.5 MG/3ML) 0.083% nebulizer solution 2.5 mg, 2.5 mg, Nebulization, Q6H PRN, Lorretta HarpXilin Niu, MD .  amitriptyline (ELAVIL) tablet 10 mg, 10 mg, Oral, QHS, Belkys A Regalado, MD, 10 mg at 11/24/15 2231 .  antiseptic oral rinse (CPC / CETYLPYRIDINIUM CHLORIDE 0.05%) solution 7 mL, 7 mL, Mouth Rinse, BID, Alison MurrayAlma M Devine, MD, 7 mL at 11/25/15 1000 .  clarithromycin (BIAXIN) tablet 500 mg, 500 mg, Oral, Q12H, Gardiner Barefootobert W Comer, MD, 500 mg at 11/25/15 0901 .  dapsone tablet 100 mg, 100 mg, Oral, Daily, Randall Hissornelius N Van Dam, MD .  dextromethorphan-guaiFENesin [facility](MUCINEX DM) 30-600 MG per 12 hr tablet 1 tablet, 1 tablet, Oral, BID PRN, Lorretta HarpXilin Niu, MD .  dextrose 5 %-0.9 % sodium chloride infusion, , Intravenous, Continuous, Alison MurrayAlma M Devine, MD, Last Rate: 75 mL/hr at 11/24/15 1837 .  dolutegravir (TIVICAY) tablet 50 mg, 50 mg, Oral, Q1200, Lorretta HarpXilin Niu, MD, 50 mg at 11/24/15 1200 .  dronabinol (MARINOL) capsule 2.5 mg, 2.5 mg, Oral, BID AC, Belia Hemanobert W  Comer, MD, 2.5 mg at 11/24/15 1835 .  emtricitabine-tenofovir AF (DESCOVY) 200-25 MG per tablet 1 tablet, 1 tablet, Oral, Q1200, Lorretta Harp, MD, 1 tablet at 11/24/15 1200 .  ethambutol (MYAMBUTOL) tablet 900 mg, 900 mg, Oral, Daily, Vassie Loll, MD, 900 mg at 11/25/15 0901 .  feeding supplement (ENSURE ENLIVE) (ENSURE ENLIVE) liquid 237 mL, 237 mL, Oral, QID, Vassie Loll, MD, 237 mL at 11/25/15 0903 .  ferrous sulfate tablet 325 mg, 325 mg, Oral, Q breakfast, Lorretta Harp, MD, 325 mg at 11/25/15 0902 .  fluconazole (DIFLUCAN) IVPB 400 mg, 400 mg, Intravenous, Q24H, Gardiner Barefoot, MD, 400 mg at 11/24/15 1800 .  ganciclovir (CYTOVENE) 150 mg in sodium chloride 0.9 % 100 mL IVPB, 2.5 mg/kg, Intravenous, Q12H, Randall Hiss, MD, 150 mg at 11/25/15 [phone] .  metoprolol (LOPRESSOR) injection 5 mg, 5 mg, Intravenous, Daily PRN, Alison Murray, MD, 5 mg at 11/16/15 0007 .  multivitamin with minerals tablet 1 tablet, 1 tablet, Oral, Daily, Alison Murray, MD, 1 tablet at 11/25/15 0902 .  ondansetron (ZOFRAN) injection 4 mg, 4 mg, Intravenous, Q8H PRN, Lorretta Harp, MD, 4 mg at 11/18/15 2347 .  prochlorperazine (COMPAZINE) injection 10 mg, 10 mg, Intravenous, Q6H PRN, Vassie Loll, MD .  sodium chloride flush (NS) 0.9 % injection 3 mL, 3 mL, Intravenous, Q12H, Lorretta Harp, MD, 3 mL at 11/25/15 1000 .  vitamin B-12 (CYANOCOBALAMIN) tablet 100 mcg, 100 mcg, Oral, Daily, Lorretta Harp, MD, 100 mcg at 11/25/15 0901 .  Vitamin D (Ergocalciferol) (DRISDOL) capsule 50,000 Units, 50,000 Units, Oral, Q Fri, Xilin Niu, MD, 50,000 Units at 11/25/15 0901  Patients Current Diet: DIET SOFT Room service appropriate? Yes; Fluid consistency: Thin Diet - low sodium heart healthy  Precautions / Restrictions Precautions Precautions: Fall Precaution Comments: monitor O2 & HR Restrictions Weight Bearing Restrictions: No   Has the patient had 2 or more falls or a fall with injury in the past year?No  Prior Activity Level Limited Community (1-2x/wk): gradual decline in function over past 6 months  Journalist, newspaper / Equipment Home Assistive Devices/Equipment: None Home Equipment: Bedside commode, Walker - 2 wheels  Prior Device Use: Indicate devices/aids used by the patient prior to current illness, exacerbation or injury? Walker  Prior Functional Level Prior Function Level of Independence: Needs assistance Gait / Transfers Assistance Needed: walked 100 ft minguard with  RW on recent admission; at home walked with minguard assist, no device, no furniture walking ADL's / Homemaking Assistance Needed: Pt indicates spouse was assisting her with ADLs PTA  Comments: spouse had taken off of work since last hospital d/c to provide care  Self Care: Did the patient need help bathing, dressing, using the toilet or eating?  Needed some help  Indoor Mobility: Did the patient need assistance with walking from room to room (with or without device)? Needed some help  Stairs: Did the patient need assistance with internal or external stairs (with or without device)? Needed some help  Functional Cognition: Did the patient need help planning regular tasks such as shopping or remembering to take medications? Needed some help  Current Functional Level Cognition Overall Cognitive Status: Impaired/Different from baseline Difficult to assess due to: Level of arousal Current Attention Level: Selective Orientation Level: Oriented X4 Following Commands: Follows one step commands with increased time, Follows one step commands consistently Safety/Judgement: Decreased awareness of safety General Comments: Increased time requried for processing     Extremity Assessment (includes Sensation/Coordination) Upper Extremity  Assessment: Generalized weakness  Lower Extremity Assessment: Defer to PT evaluation RLE Deficits / Details: AROM WFL; knee extension 3+ LLE Deficits / Details: AROM WFL; knee extension 3+   ADLs Overall ADL's : Needs assistance/impaired Eating/Feeding: Supervision/ safety Eating/Feeding Details (indicate cue type and reason): Pt has difficulty bringing food to mouth due to weakness Grooming: Minimal assistance, Oral care, Wash/dry face, Sitting Grooming Details (indicate cue type and reason): Assist for cap back on toothpaste and set up for all supplies. Pt quick to fatigue. Upper Body Bathing: Sitting, Total assistance Upper Body Bathing Details (indicate cue  type and reason): max assist to maintain dynamic sitting EOB Lower Body Bathing: Maximal assistance, +2 for physical assistance, Bed level Upper Body Dressing : Total assistance, Sitting Upper Body Dressing Details (indicate cue type and reason): max assist to maintain dynamic sitting EOB  Lower Body Dressing: Maximal assistance, +2 for physical assistance, Bed level Toilet Transfer: Moderate assistance, +2 for physical assistance, +2 for safety/equipment Toilet Transfer Details (indicate cue type and reason): simulated EOB to recliner transfer  Toileting- Clothing Manipulation and Hygiene: Total assistance, Sit to/from stand Toileting - Clothing Manipulation Details (indicate cue type and reason): Pt incontinenet if BM Functional mobility during ADLs: Maximal assistance General ADL Comments: Pt fatigues very quickly with functional activities. Pt noted to have flexed posture of wrists and digits; pt able to actively come out of posture with verbal cues. Encouraged proper positioning of bil UEs.    Mobility Overal bed mobility: Needs Assistance Bed Mobility: Supine to Sit Rolling: Mod assist Sidelying to sit: Mod assist, +2 for physical assistance Supine to sit: Max assist, +2 for physical assistance Sit to supine: Total assist, +2 for physical assistance Sit to sidelying: Max assist, +2 for physical assistance, +2 for safety/equipment General bed mobility comments: Pt OOB in chair upon arrival.   Transfers Overall transfer level: Needs assistance Equipment used: Rolling walker (2 wheeled) Transfers: Sit to/from Stand, Stand Pivot Transfers Sit to Stand: +2 physical assistance, Min assist Stand pivot transfers: Mod assist, +2 safety/equipment General transfer comment: Not assessed at this time.   Ambulation / Gait / Stairs / Wheelchair Mobility Ambulation/Gait Ambulation/Gait assistance: Min assist, Mod assist, +2 safety/equipment Ambulation Distance (Feet): 40 Feet Assistive device:  Rolling walker (2 wheeled) Gait Pattern/deviations: Step-through pattern, Decreased stride length, Trunk flexed General Gait Details: deferred due to tachycardia and fatigue Gait velocity: very slow pattern   Posture / Balance Dynamic Sitting Balance Sitting balance - Comments: FAIR STATIC; POOR DYNAMIC Balance Overall balance assessment: Needs assistance (Simultaneous filing. User may not have seen previous data.) Sitting-balance support: Feet supported Sitting balance-Leahy Scale: Fair Sitting balance - Comments: FAIR STATIC; POOR DYNAMIC Postural control: Posterior lean, Right lateral lean (maintains sitting with min guard; cannot accept ANYchallenge) Standing balance support: Bilateral upper extremity supported Standing balance-Leahy Scale: Poor Standing balance comment: HHA of 2   Special needs/care consideration BiPAP/CPAP no CPM no Continuous Drip IV no Dialysis no Life Vest no Oxygen: 02 2 L [facility] Special Bed no Trach Size no Wound Vac (area) no Skin Sacral redness                               Bowel mgmt: Last BM 11/24/15,  incontinent Bladder mgmt: incontinent Diabetic mgmt no Spouse and pt aware of diagnosis only. Not other family members   Previous Home Environment Living Arrangements: Spouse/significant other  Lives With: Spouse Available Help at Discharge:  (  spouse states he will talk to family about help providing 24) Type of Home: House Home Layout: Multi-level Alternate Level Stairs-Rails: Left Alternate Level Stairs-Number of Steps: 5 up and 5 down Home Access: Stairs to enter Entrance Stairs-Rails: None Entrance Stairs-Number of Steps: 2 Bathroom Shower/Tub: Associate Professor: Yes How Accessible: Accessible via walker Home Care Services: No  Discharge Living Setting Plans for Discharge Living Setting: Patient's home, Lives with (comment) (spouse) Type of Home at Discharge: House Discharge Home Layout:  Multi-level Alternate Level Stairs-Rails: Left Alternate Level Stairs-Number of Steps: 5 up and 5 down Discharge Home Access: Stairs to enter Entrance Stairs-Rails: None Entrance Stairs-Number of Steps: 2 Discharge Bathroom Shower/Tub: Tub/shower unit, Curtain Discharge Bathroom Toilet: Standard Discharge Bathroom Accessibility: Yes How Accessible: Accessible via walker Does the patient have any problems obtaining your medications?: No  Social/Family/Support Systems Patient Roles: Spouse, Parent Contact Information: Neosha Switalski, spouse Anticipated Caregiver: spouse and his daughter in laws and sons Anticipated Caregiver's Contact Information: pt does not use his cell phone Ability/Limitations of Caregiver: spouse works 3 am until about 2 pm daily.  Caregiver Availability: 24/7 (spouse will arrange with family 24/7 care) Discharge Plan Discussed with Primary Caregiver: Yes Is Caregiver In Agreement with Plan?: Yes Does Caregiver/Family have Issues with Lodging/Transportation while Pt is in Rehab?: No (spouse stays in hospital with pt when he is not working)  Goals/Additional Needs Patient/Family Goal for Rehab: Mod I to supervision  Expected length of stay: 12- 16 days Dietary Needs: pt with very poor appetite Equipment Needs: yonker suction for phlegm prn Special Service Needs: Pt and spouse awar of new HIV status diagnosis, but other family members have not been told officially Additional Information: pt really wants to go home asap Pt/Family Agrees to Admission and willing to participate: Yes Program Orientation Provided & Reviewed with Pt/Caregiver Including Roles  & Responsibilities: Yes  Decrease burden of Care through IP rehab admission: n/a  Possible need for SNF placement upon discharge:not anticipated  Patient Condition: This patient's medical and functional status has changed since the consult dated: 11/09/2015 in which the Rehabilitation Physician determined and  documented that the patient's condition is appropriate for intensive rehabilitative care in an inpatient rehabilitation facility. See "History of Present Illness" (above) for medical update. Functional changes are: Currently requiring min assist for stand pivot transfers.  Cognition is impaired.  Patient's medical and functional status update has been discussed with the Rehabilitation physician and patient remains appropriate for inpatient rehabilitation. Will admit to inpatient rehab today.   Preadmission Screen Completed By:  Samantha Kline, 11/25/2015 1:16 PM ______________________________________________________________________   Discussed status with Dr. Riley Kill on 11/25/15 at 1316 and received telephone approval for admission today.  Admission Coordinator:  Samantha Kline, time1316/Date08/04/17       Cosigned by: Ranelle Oyster, MD at 11/25/2015 1:22 PM  Revision History

## 2015-11-29 ENCOUNTER — Inpatient Hospital Stay (HOSPITAL_COMMUNITY): Payer: Managed Care, Other (non HMO) | Admitting: Physical Therapy

## 2015-11-29 ENCOUNTER — Inpatient Hospital Stay (HOSPITAL_COMMUNITY): Payer: Managed Care, Other (non HMO) | Admitting: Occupational Therapy

## 2015-11-29 ENCOUNTER — Inpatient Hospital Stay (HOSPITAL_COMMUNITY): Payer: Managed Care, Other (non HMO) | Admitting: *Deleted

## 2015-11-29 ENCOUNTER — Inpatient Hospital Stay (HOSPITAL_COMMUNITY): Payer: Managed Care, Other (non HMO) | Admitting: Speech Pathology

## 2015-11-29 DIAGNOSIS — Z7189 Other specified counseling: Secondary | ICD-10-CM | POA: Insufficient documentation

## 2015-11-29 DIAGNOSIS — L89152 Pressure ulcer of sacral region, stage 2: Secondary | ICD-10-CM

## 2015-11-29 DIAGNOSIS — G9340 Encephalopathy, unspecified: Secondary | ICD-10-CM

## 2015-11-29 DIAGNOSIS — L98429 Non-pressure chronic ulcer of back with unspecified severity: Secondary | ICD-10-CM | POA: Diagnosis present

## 2015-11-29 DIAGNOSIS — A310 Pulmonary mycobacterial infection: Secondary | ICD-10-CM

## 2015-11-29 DIAGNOSIS — Z515 Encounter for palliative care: Secondary | ICD-10-CM | POA: Insufficient documentation

## 2015-11-29 DIAGNOSIS — B370 Candidal stomatitis: Secondary | ICD-10-CM | POA: Diagnosis present

## 2015-11-29 LAB — CBC WITH DIFFERENTIAL/PLATELET
BASOS PCT: 2 %
Basophils Absolute: 0.1 10*3/uL (ref 0.0–0.1)
EOS ABS: 0 10*3/uL (ref 0.0–0.7)
EOS PCT: 1 %
HEMATOCRIT: 26 % — AB (ref 36.0–46.0)
HEMOGLOBIN: 8.7 g/dL — AB (ref 12.0–15.0)
LYMPHS PCT: 25 %
Lymphs Abs: 1 10*3/uL (ref 0.7–4.0)
MCH: 31.2 pg (ref 26.0–34.0)
MCHC: 33.5 g/dL (ref 30.0–36.0)
MCV: 93.2 fL (ref 78.0–100.0)
MONOS PCT: 16 %
Monocytes Absolute: 0.7 10*3/uL (ref 0.1–1.0)
NEUTROS ABS: 2.3 10*3/uL (ref 1.7–7.7)
NEUTROS PCT: 56 %
PLATELETS: 183 10*3/uL (ref 150–400)
RBC: 2.79 MIL/uL — ABNORMAL LOW (ref 3.87–5.11)
RDW: 18.3 % — ABNORMAL HIGH (ref 11.5–15.5)
WBC: 4.1 10*3/uL (ref 4.0–10.5)

## 2015-11-29 LAB — MAGNESIUM: Magnesium: 1.9 mg/dL (ref 1.7–2.4)

## 2015-11-29 LAB — PHOSPHORUS: Phosphorus: 3.2 mg/dL (ref 2.5–4.6)

## 2015-11-29 MED ORDER — FLUCONAZOLE 100 MG PO TABS
200.0000 mg | ORAL_TABLET | Freq: Every day | ORAL | Status: DC
  Administered 2015-11-29 – 2015-12-12 (×14): 200 mg via ORAL
  Filled 2015-11-29 (×14): qty 2

## 2015-11-29 MED ORDER — GERHARDT'S BUTT CREAM
TOPICAL_CREAM | Freq: Two times a day (BID) | CUTANEOUS | Status: DC
  Administered 2015-11-29 – 2015-12-01 (×5): via TOPICAL
  Administered 2015-12-01 – 2015-12-02 (×2): 1 via TOPICAL
  Administered 2015-12-02 – 2015-12-06 (×8): via TOPICAL
  Administered 2015-12-06: 1 via TOPICAL
  Administered 2015-12-07: 20:00:00 via TOPICAL
  Administered 2015-12-07: 1 via TOPICAL
  Administered 2015-12-08 – 2015-12-23 (×31): via TOPICAL
  Filled 2015-11-29: qty 1

## 2015-11-29 NOTE — Consult Note (Signed)
Consultation Note Date: 11/29/2015   Patient Name: Samantha Kline  DOB: [birth year]  MRN: [id number]  Age / Sex: 55 y.o., female  PCP: No Pcp Per Patient Referring Physician: Jamse Arn, MD  Reason for Consultation: Establishing goals of care  HPI/Patient Profile: 55 y.o. female  with past medical history of anemia, 50+ lb weight loss and recently diagnosed AIDS infection.  She was admitted on 11/25/2015 for rehabilitation after being discharged from a hospital stay for hyponatremia, fever of unknown origin (PCP infection vs IRIS), severe protein calorie malnutrition and severe dysphagia secondary to ulcerations from possible CMV vs HSV esophagitis.  Albumin of 1.3. She currently has poor functional status and is too weak to walk.  She is refusing a PEG, but is attempting to consume protein drinks.  Clinical Assessment and Goals of Care: Samantha Dow, NP and I met with Samantha Kline, of CIR prior to meeting with the patient.  We then meet with the patient at bedside.  She was extremely weak, barely able to speak, and told us she was extremely tired.  We introduced Palliative medicine as specialized medical care for people living with serious illness. It focuses on providing relief from the symptoms and stress of a serious illness. The goal is to improve quality of life for both the patient and the family.    The patient told us that she lived at home with her husband.  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  She and her husband have no children.  Her mother is alive and they are close.  The patient tells [place] that she was always healthy before this illness.  This has all been very surprising to her.  She does not know how she contracted HIV.  She would like to speak with her family about her illness but finds it very hard.  Her goal is to get home.  She is of [place] faith.   She  complained of fatigue and pain with swallowing.  The patient is her own primary decision maker.    SUMMARY OF RECOMMENDATIONS    Code Status/Advance Care Planning:  DNR    Symptom Management:   Agree with Butt cream, magic mouthwash, and foley to avoid wounds as she is incontinent of bowel and bladder  Additional Recommendations (Limitations, Scope, Preferences):  Full Scope Treatment  Psycho-social/Spiritual:   Desire for further Chaplaincy support:yes  Additional Recommendations: Caregiving  Support/Resources  Prognosis:   Unable to determine:    Discharge Planning: Home with Palliative Services      Primary Diagnoses: Present on Admission: . Physical debility . Human immunodeficiency virus (HIV) disease ([place]) . Acute encephalopathy   I have reviewed the medical record, interviewed the patient and family, and examined the patient. The following aspects are pertinent.  Past Medical History:  Diagnosis Date  . Anemia   . HIV (human immunodeficiency virus infection) ([place])    11/06/15 Family states it was just diagnosed this week  . Pneumonia  10/24/2015  . Vitamin D deficiency    Social History   Social History  . Marital status: Married    Spouse name: N/A  . Number of children: N/A  . Years of education: N/A   Social History Main Topics  . Smoking status: Never Smoker  . Smokeless tobacco: Never Used  . Alcohol use Yes     Comment: 10/24/2015 "nothing since the 1980s"  . Drug use: No  . Sexual activity: Not on file   Other Topics Concern  . Not on file   Social History Narrative  . No narrative on file   Family History  Problem Relation Age of Onset  . Diabetes Father   . Heart attack Brother   . Hypertension Sister    Scheduled Meds: . amitriptyline  10 mg Oral QHS  . antiseptic oral rinse  7 mL Mouth Rinse BID  . clarithromycin  500 mg Oral Q12H  . dapsone  100 mg Oral Daily  . dimethicone   Topical TID PC  . dolutegravir  50 mg Oral  Q1200  . dronabinol  2.5 mg Oral BID AC  . emtricitabine-tenofovir AF  1 tablet Oral Q1200  . ethambutol  900 mg Oral Daily  . feeding supplement (ENSURE ENLIVE)  237 mL Oral QID  . feeding supplement (PRO-STAT SUGAR FREE 64)  30 mL Oral Q1500  . ferrous sulfate  325 mg Oral Q breakfast  . fluconazole  200 mg Oral Daily  . ganciclovir (CYTOVENE) IV  2.5 mg/kg Intravenous Q12H  . Gerhardt's butt cream   Topical BID  . metoprolol tartrate  12.5 mg Oral BID  . multivitamin with minerals  1 tablet Oral Daily  . potassium chloride  20 mEq Oral BID  . protein supplement  1 scoop Oral TID WC  . vitamin B-12  100 mcg Oral Daily  . [START ON 12/02/2015] Vitamin D (Ergocalciferol)  50,000 Units Oral Q Fri   Continuous Infusions:  PRN Meds:.acetaminophen, albuterol, alum & mag hydroxide-simeth, bisacodyl, dextromethorphan-guaiFENesin, diphenhydrAMINE, ondansetron, prochlorperazine **OR** prochlorperazine **OR** prochlorperazine, senna-docusate, sodium phosphate, traZODone   Allergies  Allergen Reactions  . Septra [Sulfamethoxazole-Trimethoprim]     Possible cause of hyponatremia   Review of Systems:  Patient denies constipation, dysuria, cp, SOB.  Complains of fatigue and weakness.  Physical Exam: Thin frail female, appears older than stated age.  Flat.  Speaks very softly CV rrr Resp nad Abd soft thin +BS Extremities able to move all 4.  1-2 edema in lower extremities.  Vital Signs: BP 134/90 (BP Location: Right Arm)   Pulse 82   Temp 98.6 F (37 C) (Oral)   Resp 18   Ht '5\' 4"'$  (1.626 m)   Wt 63 kg (138 lb 14.6 oz)   SpO2 97%   BMI 23.84 kg/m  Pain Assessment: No/denies pain   Pain Score: 0-No pain   SpO2: SpO2: 97 % O2 Device:SpO2: 97 % O2 Flow Rate: .O2 Flow Rate (L/min): 2 L/min  IO: Intake/output summary:  Intake/Output Summary (Last 24 hours) at 11/29/15 1541 Last data filed at 11/29/15 1100  Gross per 24 hour  Intake              120 ml  Output             2300 ml   Net            -2180 ml    LBM: Last BM Date: 11/28/15 Baseline Weight: Weight: 60.3 kg (133 lb) Most  recent weight: Weight: 63 kg (138 lb 14.6 oz)     Palliative Assessment/Data:   Flowsheet Rows   Flowsheet Row Most Recent Value  Intake Tab  Referral Department  -- [CIR]  Unit at Time of Referral  Other (Comment)  Palliative Care Primary Diagnosis  Neurology  Date Notified  11/29/15  Palliative Care Type  New Palliative care  Reason for referral  Clarify Goals of Care  Date of Admission  11/25/15  Date first seen by Palliative Care  11/29/15  # of days Palliative referral response time  0 Day(s)  # of days IP prior to Palliative referral  4  Clinical Assessment  Palliative Performance Scale Score  40%  Psychosocial & Spiritual Assessment  Palliative Care Outcomes  Patient/Family meeting held?  Yes  Who was at the meeting?  patient      Time In: 2:00 Time Out: 3:10 Time Total: 70 min. Greater than 50%  of this time was spent counseling and coordinating care related to the above assessment and plan.  Signed by:  Imogene Burn, PA-C Palliative Medicine Pager: [phone]   Please contact Palliative Medicine Team phone at [phone] for questions and concerns.  For individual provider: See Shea Evans

## 2015-11-29 NOTE — Progress Notes (Signed)
WOC Nurse wound consult note Reason for Consult:Multiple wounds to intergluteal cleft of buttocks Wound type: Multiple partial thickness wounds to intergluteal folds extending down around anus. Wounds most likely a result of MASD from urinary and fecal incontinence. Pressure Ulcer POA: /No Measurement: Wounds cover 9 cm x 8.5 cm area between gluteal folds and approximately 3 cm x 1.5 cm around anus Wound bed: Wound beds pale pink  Drainage (amount, consistency, odor) Scant foul smelling drainage noted on bed linen. Undetermined if drainage was vaginal or from wounds. Patient recently had a foley cath placed. Patient's nurse was at bedside and was unsure of source of drainage Periwound: intact Dressing procedure/placement/frequency: Cleanse wounds between gluteal folds with soap and water, pat dry. Cover wound bed with Xeroform gauze and secure with sacral foam dressing. Change dressing every other day and prn if soiled.  (2) Apply Gerhardt's Butt cream to wounds around anus BID   Re consult if needed, will not follow at this time. Thanks, Lemmie EvensJacqueline Zurri Rudden MSN, RN, [employer]

## 2015-11-29 NOTE — Progress Notes (Signed)
Physical Therapy Session Note  Patient Details  Name: Samantha Kline MRN: [id number] Date of Birth: 16-May-1960  Today's Date: 11/29/2015 PT Individual Time: 1001-1027 PT Individual Time Calculation (min): 26 min    Short Term Goals: Week 1:  PT Short Term Goal 1 (Week 1): Pt will maintain static and dynamic sitting balance in midline with mod A PT Short Term Goal 2 (Week 1): Pt will perform bed mobility with max A with pt initiating 25% of transfer sequence PT Short Term Goal 3 (Week 1): Pt will perform bed <> w/c transfers with consistent max A of one person and initiating 25% of transfer PT Short Term Goal 4 (Week 1): Pt will tolerate 1-2 hours up in reclining w/c to improve core strength and positioning for meals PT Short Term Goal 5 (Week 1): Pt will initiate pre-gait activities in standing with max A  Skilled Therapeutic Interventions/Progress Updates:    Pt received in w/c & agreeable to PT, denying c/o pain. Pt with difficulty keeping eyes open throughout session even with max cuing. Pt instructed on & performed BLE long arc quads for strengthening but pt only able to perform ~10 at a time with extra time & maximum encouragement/cuing. Instructed pt on reaching forward and anterior weight shifts for core activation/strengthening but pt required max/total A to do so. Pt reported "I've done all I can" & noted significant fatigue. At end of session pt left sitting in w/c with RN present.   Therapy Documentation Precautions:  Precautions Precautions: Fall Restrictions Weight Bearing Restrictions: No  Pain: Pain Assessment Pain Assessment: No/denies pain   See Function Navigator for Current Functional Status.   Therapy/Group: Individual Therapy  Sandi MariscalVictoria M Smera Guyette 11/29/2015, 12:31 PM

## 2015-11-29 NOTE — Patient Care Conference (Signed)
Inpatient RehabilitationTeam Conference and Plan of Care Update Date: 11/29/2015   Time: 10:40 AM    Patient Name: Samantha FewHythia Menton      Medical Record Number: [id number]  Date of Birth: [birth year] Sex: Female         Room/Bed: 4W24C/4W24C-01 Payor Info: Payor: C[employer] / Plan: CIGNA MANAGED / Product Type: *No Product type* /    Admitting Diagnosis: Debility  Admit Date/Time:  11/25/2015  5:22 PM Admission Comments: No comment available   Primary Diagnosis:  Physical debility Principal Problem: Physical debility  Patient Active Problem List   Diagnosis Date Noted  . Stage 2 skin ulcer of sacral region   . Mycobacterium avium complex (HCC)   . Thrush   . Palliative care encounter   . Goals of care, counseling/discussion   . Leukocytosis   . HIV encephalopathy (HCC)   . Benign essential HTN   . Dysphagia   . Antineoplastic chemotherapy induced pancytopenia (HCC)   . Drug-induced folate deficiency anemia   . Physical debility 11/25/2015  . Esophageal ulcer   . Malnutrition of moderate degree 11/22/2015  . Anemia due to bone marrow failure (HCC)   . Cytomegalovirus (CMV) viremia (HCC)   . Esophagitis, CMV   . FTT (failure to thrive) in adult   . Physical deconditioning   . AIDS (HCC)   . Candida esophagitis (HCC)   . FUO (fever of unknown origin)   . Arterial hypotension   . Tachypnea   . Tachycardia   . Anemia of chronic disease   . Hyperkalemia 11/07/2015  . Nausea & vomiting 11/07/2015  . Pressure ulcer 11/07/2015  . Elevated lactic acid level 11/07/2015  . Sepsis (HCC)   . Acute encephalopathy 11/06/2015  . Hyponatremia 11/06/2015  . Bilateral pneumonia   . Protein-calorie malnutrition, severe (HCC) 10/28/2015  . CAP (community acquired pneumonia) 10/27/2015  . Human immunodeficiency virus (HIV) disease (HCC)   . Acute hypoxemic respiratory failure (HCC) 10/25/2015  . Hypokalemia   . 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  Anemia 10/24/2015  . Weight loss     Expected Discharge Date:    Team Members Present: Physician leading conference: Dr. Maryla MorrowAnkit Patel Social Worker Present: Dossie DerBecky Rawlin Reaume, LCSW Nurse Present: Keturah BarreEd Knisley, RN PT Present: Aleda GranaVictoria Miller, PT OT Present: Roney MansJennifer Smith, OT SLP Present: Feliberto Gottronourtney Payne, SLP PPS Coordinator present : Tora DuckMarie Noel, RN, CRRN     Current Status/Progress Goal Weekly Team Focus  Medical    Function, mobility and cognitive deficits secondary to HIV encephalopathy now 15/7  Improve endurance, safety, electrolytes, wounds, hematology  See above   Bowel/Bladder   Incontinent of bowel. LBM 11/28/15. Foley cath draining amber color urine  Managed bowel and bladder   Timed toileting q 2hrs    Swallow/Nutrition/ Hydration   soft diet   min assist   diet toleration and advancement as tolerated    ADL's   Total assist bathing, dressing, grooming and functional transfers- very low activity tolerance  Mod A bathing, Min A UB dressing, Mod A LB dressing, Mod A toileting, Min A grooming  activity tolerance, participation in self-care tasks, transfers, pt and family ed   Mobility   total A for bed mobility, sitting balance, transfers w/c<>Bed, extremely poor activity tolerance  Min A sitting balance, Mod A standing balance, min A bed mobility, mod A transfers, Mod A ambulation 25 ft with LRAD, supervision for w/c mobility  pt & family education, bed mobility, transfers,  sitting balance, activity tolerance   Communication             Safety/Cognition/ Behavioral Observations  limited participation due to fatigue, decreased recall of daily information, poor sustained attnetion to tasks, fluctuating orientation  min assist for basic   orientation, attention to tasks, recall of basic daily information    Pain   No c/o pain  <3  Monitor for nonverbal cues of pain   Skin   MASD. Gerhardt butt cream w/Telfa, and Allevyn dressing   No additional skin breakdown  Monitor for  effectiveness of regiment      *See Care Plan and progress notes for long and short-term goals.  Barriers to Discharge: Electrolyte abnormalities, hematological abnormalities, wounds, endurance    Possible Resolutions to Barriers:  Follow labs, cont meds, pressure relief, therapies    Discharge Planning/Teaching Needs:  Plan to go home with husband and children-son's and daughter in laws assisting. Made aware will need 24 hr physical care      Team Discussion:  Goals min/mod assist level. Extremely poor activity tolerance and fatigue limit participation. Made 15/7 for therapies. Medical issues working on electrolytes, wound issues, and medication. WOC consulted for skin issues. Question palliative care consult to set goals of care here. Complains of dizziness-MD to prescribe medication for this. Has memory, recall and fluctuates in orientation.   Revisions to Treatment Plan:  None   Continued Need for Acute Rehabilitation Level of Care: The patient requires daily medical management by a physician with specialized training in physical medicine and rehabilitation for the following conditions: Daily direction of a multidisciplinary physical rehabilitation program to ensure safe treatment while eliciting the highest outcome that is of practical value to the patient.: Yes Daily medical management of patient stability for increased activity during participation in an intensive rehabilitation regime.: Yes Daily analysis of laboratory values and/or radiology reports with any subsequent need for medication adjustment of medical intervention for : Wound care problems;Other;Nutritional problems  Lucy Chris 11/30/2015, 9:19 AM

## 2015-11-29 NOTE — Progress Notes (Signed)
Recreational Therapy Session Note  Patient Details  Name: Samantha Kline MRN: [id number] Date of Birth: 1960-11-05 Today's Date: 11/29/2015  Attempted eval completion with pt seated in w/c.  Pt opened her eyes briefly but unable to stay awake.  Eval deferred at this time as per chart review & observation pt with low activity tolerance.  Will continue to monitor through team.  Kipper Buch 11/29/2015, 3:50 PM

## 2015-11-29 NOTE — Progress Notes (Signed)
Social Work Patient ID: Samantha Kline, female   DOB: 21-Aug-1960, 55 y.o.   MRN: [id number]   Met with pt and left a message for husband regarding team conference goals-min/mod level and target discharge 3-4 weeks here depending Upon her progress. Will await return call from husband and work on a discharge plan.

## 2015-11-29 NOTE — Progress Notes (Signed)
Physical Therapy Session Note  Patient Details  Name: Samantha Kline MRN: [id number] Date of Birth: [birth year]  Today's Date: 11/29/2015 PT Individual Time: 1400-1455 PT Individual Time Calculation (min): 55 min    Short Term Goals: Week 1:  PT Short Term Goal 1 (Week 1): Pt will maintain static and dynamic sitting balance in midline with mod A PT Short Term Goal 2 (Week 1): Pt will perform bed mobility with max A with pt initiating 25% of transfer sequence PT Short Term Goal 3 (Week 1): Pt will perform bed <> w/c transfers with consistent max A of one person and initiating 25% of transfer PT Short Term Goal 4 (Week 1): Pt will tolerate 1-2 hours up in reclining w/c to improve core strength and positioning for meals PT Short Term Goal 5 (Week 1): Pt will initiate pre-gait activities in standing with max A  Skilled Therapeutic Interventions/Progress Updates:    Patient in reclining back wheelchair with elevating leg rests upon arrival in extremely slumped position, requiring increased time and max multimodal cues to open eyes. Reclining back wheelchair switched out for 18 x 18 TIS wheelchair with pressure relieving cushion for improved sitting tolerance, postural control, and decreased burden of care for pressure relief to prevent further skin breakdown. Patient did not consume any of lunch meal upon arrival and consumed 2 bites of mac and cheese with prompting before refusing rest of meal. Patient requesting that therapist and rehab tech "leave her alone" and "stop shoving food in my face." Patient required max A to come to upright from reclined position and in supported sitting, donned 2 pillowcases using BUE with min-mod A and more than reasonable time with encouragement. Utilized Stedy to transfer wheelchair <> bed with mod-max A overall and patient stating, "Why do you keep making me stand?" Patient transferred sit > supine and repositioned in bed with +2A. Patient left semi reclined in bed,  missed 20 min skilled physical therapy due to refusal to participate and demanding to be left alone.   Therapy Documentation Precautions:  Precautions Precautions: Fall Restrictions Weight Bearing Restrictions: No General: PT Amount of Missed Time (min): 20 Minutes PT Missed Treatment Reason: Patient unwilling to participate Vital Signs: Therapy Vitals Pulse Rate: 82 Resp: 18 BP: 134/90 Patient Position (if appropriate): Sitting Oxygen Therapy SpO2: 100 % O2 Device: Not Delivered Pain: Pain Assessment Pain Assessment: No/denies pain Faces Pain Scale: Hurts a little bit Pain Type: Acute pain Pain Location: Back Pain Descriptors / Indicators: Aching Pain Intervention(s): RN made aware;Repositioned   See Function Navigator for Current Functional Status.   Therapy/Group: Individual Therapy  Kerney ElbeVarner, Auri Jahnke A 11/29/2015, 3:01 PM

## 2015-11-29 NOTE — Progress Notes (Addendum)
Subjective:  She feels tired and today says her swallowing has not improved Antibiotics:  Anti-infectives    Start     Dose/Rate Route Frequency Ordered Stop   11/29/15 1545  fluconazole (DIFLUCAN) tablet 200 mg     200 mg Oral Daily 11/29/15 1532     11/26/15 1600  fluconazole (DIFLUCAN) IVPB 400 mg  Status:  Discontinued     400 mg 100 mL/hr over 120 Minutes Intravenous Every 24 hours 11/25/15 1726 11/25/15 1819   11/26/15 1200  dolutegravir (TIVICAY) tablet 50 mg     50 mg Oral Daily 11/25/15 1726     11/26/15 1200  emtricitabine-tenofovir AF (DESCOVY) 200-25 MG per tablet 1 tablet     1 tablet Oral Daily 11/25/15 1726     11/26/15 0800  dapsone tablet 100 mg     100 mg Oral Daily 11/25/15 1726     11/26/15 0800  ethambutol (MYAMBUTOL) tablet 900 mg    Comments:  Adjust dose as needed   900 mg Oral Daily 11/25/15 1726     11/25/15 2000  clarithromycin (BIAXIN) tablet 500 mg    Comments:  Can use suspension if she has trouble swallowing   500 mg Oral Every 12 hours 11/25/15 1726     11/25/15 1800  ganciclovir (CYTOVENE) 150 mg in sodium chloride 0.9 % 100 mL IVPB     2.5 mg/kg  60.6 kg 100 mL/hr over 60 Minutes Intravenous Every 12 hours 11/25/15 1726        Medications: Scheduled Meds: . amitriptyline  10 mg Oral QHS  . antiseptic oral rinse  7 mL Mouth Rinse BID  . clarithromycin  500 mg Oral Q12H  . dapsone  100 mg Oral Daily  . dimethicone   Topical TID PC  . dolutegravir  50 mg Oral Q1200  . dronabinol  2.5 mg Oral BID AC  . emtricitabine-tenofovir AF  1 tablet Oral Q1200  . ethambutol  900 mg Oral Daily  . feeding supplement (ENSURE ENLIVE)  237 mL Oral QID  . feeding supplement (PRO-STAT SUGAR FREE 64)  30 mL Oral Q1500  . ferrous sulfate  325 mg Oral Q breakfast  . fluconazole  200 mg Oral Daily  . ganciclovir (CYTOVENE) IV  2.5 mg/kg Intravenous Q12H  . Gerhardt's butt cream   Topical BID  . metoprolol tartrate  12.5 mg Oral BID  . multivitamin  with minerals  1 tablet Oral Daily  . potassium chloride  20 mEq Oral BID  . protein supplement  1 scoop Oral TID WC  . vitamin B-12  100 mcg Oral Daily  . [START ON 12/02/2015] Vitamin D (Ergocalciferol)  50,000 Units Oral Q Fri   Continuous Infusions:   PRN Meds:.acetaminophen, albuterol, alum & mag hydroxide-simeth, bisacodyl, dextromethorphan-guaiFENesin, diphenhydrAMINE, ondansetron, prochlorperazine **OR** prochlorperazine **OR** prochlorperazine, senna-docusate, sodium phosphate, traZODone    Objective: Weight change:   Intake/Output Summary (Last 24 hours) at 11/29/15 1539 Last data filed at 11/29/15 1100  Gross per 24 hour  Intake              120 ml  Output             2300 ml  Net            -2180 ml   Blood pressure 134/90, pulse 82, temperature 98.6 F (37 C), temperature source Oral, resp. rate 18, height 5\' 4"  (1.626 m), weight 138 lb 14.6 oz (63 kg),  SpO2 100 %. Temp:  [98.6 F (37 C)] 98.6 F (37 C) (08/08 0604) Pulse Rate:  [82-90] 82 (08/08 1418) Resp:  [18-20] 18 (08/08 1418) BP: (134-138)/(80-90) 134/90 (08/08 1418) SpO2:  [100 %] 100 % (08/08 1418)  Physical Exam: General: weak but awake and oriented to person HEENT: anicteric sclera, pupils reactive to light and accommodation, EOMI she appears to have thrush on her tongue CVS tachycardic rate, normal r,  no murmur rubs or gallops Chest: clear to auscultation bilaterally, no wheezing, rales or rhonchi Abdomen: soft not especially tender, nondistended, normal bowel sounds, Extremities: no  clubbing or edema noted bilaterally Skin: no rashes Neuro: nonfocal  CBC: CBC Latest Ref Rng & Units 11/29/2015 11/26/2015 11/24/2015  WBC 4.0 - 10.5 K/uL 4.1 14.4(H) 10.3  Hemoglobin 12.0 - 15.0 g/dL 1.6(X) 0.9(U) 0.4(V)  Hematocrit 36.0 - 46.0 % 26.0(L) 29.1(L) 28.8(L)  Platelets 150 - 400 K/uL 183 193 195      BMET  Recent Labs  11/28/15 1027  NA 134*  K 3.7  CL 107  CO2 24  GLUCOSE 97  BUN 8    CREATININE 0.75  CALCIUM 7.3*     Liver Panel  No results for input(s): PROT, ALBUMIN, AST, ALT, ALKPHOS, BILITOT, BILIDIR, IBILI in the last 72 hours.     Sedimentation Rate No results for input(s): ESRSEDRATE in the last 72 hours. C-Reactive Protein No results for input(s): CRP in the last 72 hours.  Micro Results: Recent Results (from the past 720 hour(s))  Culture, blood (Routine x 2)     Status: None   Collection Time: 11/06/15  8:30 PM  Result Value Ref Range Status   Specimen Description BLOOD LEFT ARM  Final   Special Requests IN PEDIATRIC BOTTLE 2CC  Final   Culture NO GROWTH 5 DAYS  Final   Report Status 11/11/2015 FINAL  Final  Culture, blood (Routine x 2)     Status: None   Collection Time: 11/06/15  8:45 PM  Result Value Ref Range Status   Specimen Description BLOOD LEFT ARM  Final   Special Requests BOTTLES DRAWN AEROBIC AND ANAEROBIC 4CC   Final   Culture NO GROWTH 5 DAYS  Final   Report Status 11/11/2015 FINAL  Final  Urine culture     Status: None   Collection Time: 11/06/15 10:36 PM  Result Value Ref Range Status   Specimen Description URINE, CATHETERIZED  Final   Special Requests NONE  Final   Culture NO GROWTH  Final   Report Status 11/08/2015 FINAL  Final  MRSA PCR Screening     Status: None   Collection Time: 11/09/15 11:54 AM  Result Value Ref Range Status   MRSA by PCR NEGATIVE NEGATIVE Final    Comment:        The GeneXpert MRSA Assay (FDA approved for NASAL specimens only), is one component of a comprehensive MRSA colonization surveillance program. It is not intended to diagnose MRSA infection nor to guide or monitor treatment for MRSA infections.   Culture, blood (routine x 2)     Status: None   Collection Time: 11/11/15  4:50 PM  Result Value Ref Range Status   Specimen Description BLOOD LEFT ANTECUBITAL  Final   Special Requests BOTTLES DRAWN AEROBIC ONLY 5CC  Final   Culture NO GROWTH 5 DAYS  Final   Report Status 11/16/2015  FINAL  Final  Culture, blood (routine x 2)     Status: None   Collection Time: 11/11/15  4:55 PM  Result Value Ref Range Status   Specimen Description BLOOD BLOOD LEFT ARM  Final   Special Requests IN PEDIATRIC BOTTLE 1CC  Final   Culture NO GROWTH 5 DAYS  Final   Report Status 11/16/2015 FINAL  Final  Acid Fast Smear (AFB)     Status: None   Collection Time: 11/11/15  8:11 PM  Result Value Ref Range Status   AFB Specimen Processing Concentration  Final    Comment: (NOTE) Performed At: [facility] [place] [address] [place], [place] [id number] Mila HomerHancock William F MD WU:[id number]:[phone]    Acid Fast Smear NOSMR  Final    Comment: (NOTE) The acid-fast bacilli (AFB) smear will not be performed due to the specimen source (blood) or submission of an insufficient quantity of specimen.    Source (AFB) BLOOD  Corrected  Culture, blood (routine x 2)     Status: None   Collection Time: 11/17/15  1:10 AM  Result Value Ref Range Status   Specimen Description BLOOD RIGHT ANTECUBITAL  Final   Special Requests BOTTLES DRAWN AEROBIC AND ANAEROBIC 10CC,5CC  Final   Culture NO GROWTH 5 DAYS  Final   Report Status 11/22/2015 FINAL  Final  Culture, blood (routine x 2)     Status: None   Collection Time: 11/17/15  1:23 AM  Result Value Ref Range Status   Specimen Description BLOOD RIGHT HAND  Final   Special Requests IN PEDIATRIC BOTTLE 3CC  Final   Culture NO GROWTH 5 DAYS  Final   Report Status 11/22/2015 FINAL  Final  Hsv Culture And Typing     Status: None   Collection Time: 11/23/15  2:55 PM  Result Value Ref Range Status   HSV Culture/Type Comment  Final    Comment: (NOTE) Negative No Herpes simplex virus isolated. Performed At: [facility] [place] [address] [place], [place] [id number] Mila HomerHancock William F MD VH:[id number]:[phone]    Source of Sample ESOPHAGUS  Final  Cytomegalovirus (CMV) Culture     Status: None   Collection Time: 11/23/15  2:55 PM  Result Value Ref Range Status    Cytomegalovirus (CMV) Culture Comment  Final    Comment: (NOTE) No Cytomegalovirus detected in the shell vial culture. Conventional tissue culture results to follow. Performed At: [facility] [place] [address] [place], [place] [id number] Mila HomerHancock William F MD UV:[id number]:[phone]    Source of Sample ESOPHEGUS  Final    Studies/Results: No results found.    Assessment/Plan:  INTERVAL HISTORY:   Dysphagia persists  11/23/15: EGD performed by Dr. Ewing SchleinMagod (greatly appreciate this) and specimen sent to path but I cannot locate any PCR's for CMV or HSV EGD did show ulcers  8/3-->11/29/15 patient started on gancyclovir. CMV not detected on shell vial culture (would have preferred PCRs), trasnferred to Rehab   Principal Problem:   Physical debility Active Problems:   Human immunodeficiency virus (HIV) disease (HCC)   Acute encephalopathy   HIV encephalopathy (HCC)   Benign essential HTN   Dysphagia   Antineoplastic chemotherapy induced pancytopenia (HCC)   Drug-induced folate deficiency anemia   Leukocytosis   Stage 2 skin ulcer of sacral region    Samantha Manson PasseyBrown is a 55 y.o. female with  Newly dx HIV/AIDS rx for PCP admitted with FTT, esophagitis, fever, started on empiric rx for M avium, fluconazole for possible esophageal candidiasis.  #1 Likely CMV esophagitis:   continue Gancyclovir but does not any longer appear to be on GCSF   --follow CBC  as she may again need G-CSF to boost neutrophil count ANC is coming down again  #2 Possible M avium infection :  Fevers broke with empiric rx, AFB cultures will incubate for 6 weeks from blood  Not convinced she actually has this  But we can continue while we await cultures to come back   #3 HIV/AIDS: she has started Tivicay and Descovy a regimen that should afford her rapid virological suppression while it will give higher risk for IRIS and indeed her VL has dropped from 200K to 200 in only 3 weeks!  I will recheck VL and CD4 with am  labs  Lab Results  Component Value Date   HIV1RNAQUANT 200 11/16/2015   Lab Results  Component Value Date   CD4TABS 20 (L) 11/16/2015   #4 Candida: she has had 2 weeks of very high dose azole and DC it but tongue with evidence of thrush so weill go back to fluconazole  daily     LOS: 4 days   Acey Lav 11/29/2015, 3:39 PM

## 2015-11-29 NOTE — Progress Notes (Addendum)
Occupational Therapy Session Note  Patient Details  Name: Samantha Kline MRN: [id number] Date of Birth: [birth year]  Today's Date: 11/29/2015 OT Individual Time: 0801-0900 OT Individual Time Calculation (min): 59 min   Short Term Goals: Week 1:  OT Short Term Goal 1 (Week 1): Pt will complete functional toilet transfer with Max A OT Short Term Goal 2 (Week 1): Pt will complete UB bathing with Min A OT Short Term Goal 3 (Week 1): Pt will complete sit to stand for LB bathing with Max A OT Short Term Goal 4 (Week 1): Pt will engage in 5 minutes of therapeutic activity without rest to increase activity tolerance for self care completion   Skilled Therapeutic Interventions/Progress Updates:   Pt greeted in bed with nurse and wound care RN entering room. Assisted pt with bed mobility and positioning for wound care to be addressed in side-lying. Briefs and pants donned in bed with Max A utilizing rolling technique. Pt came to sitting EOB with total A and Max A to maintain sitting balance. Pt reported fatigue and dizziness (BP 124/82 in sitting) but agreeable to participate further in O T. Pt transferred to w/c utilizing Stedy and Max A to come to partial stand in stedy. Pt then completed UB bathing and dressing at the sink with total A, set-up, and multiple rest breaks/encouragement 2/2 fatigue and very low activity tolerance. With encouragement, pt agreeable to stay up in w/c with leg rests elevated at end of OT session. Safety belt placed and call bell within reach.   Therapy Documentation Precautions:  Precautions Precautions: Fall Restrictions Weight Bearing Restrictions: No General: General PT Missed Treatment Reason: Patient unwilling to participate Vital Signs: SpO2: 97 % O2 Device: Not Delivered Pulse Oximetry Type: Intermittent Pain: Pain Assessment Pain Assessment: No/denies pain  See Function Navigator for Current Functional Status.   Therapy/Group: Individual  Therapy  Oretha Milchlisabeth E Sitton 11/29/2015, 3:42 PM

## 2015-11-29 NOTE — Progress Notes (Signed)
Speech Language Pathology Daily Session Note  Patient Details  Name: Samantha Kline MRN: [id number] Date of Birth: 07-Nov-1960  Today's Date: 11/29/2015 SLP Individual Time: 1100-1145 SLP Individual Time Calculation (min): 45 min   Short Term Goals: Week 1: SLP Short Term Goal 1 (Week 1): Patient will initiate self-care tasks in under 30 seconds with no more than 2 verbal cues  SLP Short Term Goal 2 (Week 1): Patient will initiate verbal expression of basic wants and needs with Mod question cues  SLP Short Term Goal 3 (Week 1): Patient will problem solve use of call bell with Mod assist verbal and visual cues  SLP Short Term Goal 4 (Week 1): Patient will utilize external aids to recall daily information with Mod assist cues SLP Short Term Goal 5 (Week 1): Patient will demonstrate sustained attention to familiar tasks for 5 minutes with Mod verbal cues for redirection  SLP Short Term Goal 6 (Week 1): Patient will demonstrate effective mastication and oral clearance of soft solid textures with Mod verbal cues  Skilled Therapeutic Interventions:  Pt was seen for skilled ST targeting cognitive goals.  Pt asleep upon arrival but easily awakened and required encouragement for participation.  Pt with oral residue from breakfast remaining in the oral cavity.  Pt completed oral care with mod assist verbal cues for initiation and sequencing following initial set up.  Oral suctioning set up in pt's room to assist in oral care following meals.  Nurse tech made aware.  Pt with complaint of an "achey" back.  RN made aware and pt was repositioned in wheelchair.  Therapist attempted to observe pt with presentations of pt's currently prescribed diet during breakfast to assess toleration of soft solids.  Pt declined all offered presentations of solids but was agreeable to taking 5 sips of water with mod encouragement.  As session progressed, pt became increasingly fatigued and difficult to redirect to structured tasks.   As a result, session was ended early.  Pt was left in wheelchair with quick release belt.  Continue per current plan of care.    Function:  Eating Eating   Modified Consistency Diet: Yes Eating Assist Level: Help managing cup/glass           Cognition Comprehension Comprehension assist level: Understands basic 50 - 74% of the time/ requires cueing 25 - 49% of the time  Expression   Expression assist level: Expresses basic 50 - 74% of the time/requires cueing 25 - 49% of the time. Needs to repeat parts of sentences.  Social Interaction Social Interaction assist level: Interacts appropriately 50 - 74% of the time - May be physically or verbally inappropriate.  Problem Solving Problem solving assist level: Solves basic 25 - 49% of the time - needs direction more than half the time to initiate, plan or complete simple activities  Memory Memory assist level: Recognizes or recalls 25 - 49% of the time/requires cueing 50 - 75% of the time    Pain Pain Assessment Pain Assessment: Faces Faces Pain Scale: Hurts a little bit Pain Type: Acute pain Pain Location: Back Pain Descriptors / Indicators: Aching Pain Intervention(s): RN made aware;Repositioned  Therapy/Group: Individual Therapy  Samantha Kline, Samantha SpryNicole Kline 11/29/2015, 11:59 AM

## 2015-11-29 NOTE — Progress Notes (Signed)
[facility] PHYSICAL MEDICINE & REHABILITATION     PROGRESS NOTE  Subjective/Complaints:  Pt seen lying in bed.  Evaluated during wound rounds.  She is fatigued.  She asks when she can go home.   ROS: +Fatigued. Denies CP, SOB, N/V/D.  Objective: Vital Signs: Blood pressure 138/82, pulse 88, temperature 98.6 F (37 C), temperature source Oral, resp. rate 20, height  (1.626 m), weight 63 kg (138 lb 14.6 oz), SpO2 100 %. No results found.  Recent Labs  11/26/15 1324 11/29/15 0705  WBC 14.4* 4.1  HGB 9.5* 8.7*  HCT 29.1* 26.0*  PLT 193 183    Recent Labs  11/26/15 1324 11/28/15 1027  NA 133* 134*  K 3.0* 3.7  CL 109 107  GLUCOSE 126* 97  BUN 9 8  CREATININE 0.78 0.75  CALCIUM 7.1* 7.3*   CBG (last 3)  No results for input(s): GLUCAP in the last 72 hours.  Wt Readings from Last 3 Encounters:  11/28/15 63 kg (138 lb 14.6 oz)  11/23/15 60.6 kg (133 lb 11.2 oz)  10/31/15 58.6 kg (129 lb 3 oz)    Physical Exam:  BP 138/82 (BP Location: Right Arm)   Pulse 88   Temp 98.6 F (37 C) (Oral)   Resp 20   Ht  (1.626 m)   Wt 63 kg (138 lb 14.6 oz)   SpO2 100%   BMI 23.84 kg/m  Constitutional: Vital signs are normal. NAD. She appears cachectic. She has a sickly appearance.  HENT: Normocephalic and atraumatic.  Mouth/Throat: Oral lesions present.  Eyes: Conjunctivae and EOM are normal.  Cardiovascular: Regular rate and rhythm. No murmur heard. Respiratory: +[place]. Effort normal and breath sounds normal. No stridor. No respiratory distress. She has no wheezes.  GI: Soft. Bowel sounds are normal. She exhibits no distension. There is no tenderness.  Musculoskeletal: She exhibits no edema or tenderness.  Muscle wasting all limbs.  Neurological: She appears listless.  Motor:  B/l UE: 3/5 deltoid, biceps, triceps, 3+ wrist and HI.  B/l LE: 2+/5 HF, 2+ KE/KF and 3+/5 ADF/PF.  Skin: Skin is warm and dry. No rash noted. No erythema.  6 small stage 2 ulcers sacrum  area. Psychiatric: Her affect is blunt. She is slowed and withdrawn. Cognition and memory are impaired.   Assessment/Plan: 1. 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 Physiatrist is providing close team supervision and 24 hour management of active medical problems listed below. Physiatrist and rehab team continue to assess barriers to discharge/monitor patient progress toward functional and medical goals.  Function:  Bathing Bathing position   Position: Wheelchair/chair at sink  Bathing parts Body parts bathed by patient: Right arm, Left arm, Chest, Abdomen, Left upper leg, Right upper leg Body parts bathed by helper: Back, Buttocks, Right lower leg, Left lower leg  Bathing assist Assist Level: 2 helpers      Upper Body Dressing/Undressing Upper body dressing   What is the patient wearing?: Hospital gown                Upper body assist Assist Level: More than reasonable time, Touching or steadying assistance(Pt > 75%)      Lower Body Dressing/Undressing Lower body dressing   What is the patient wearing?: Pants   Underwear - Performed by helper: Thread/unthread right underwear leg, Thread/unthread left underwear leg, Pull underwear up/down   Pants- Performed by helper: Thread/unthread right  pants leg, Thread/unthread left pants leg, Pull pants up/down   Non-skid slipper socks- Performed by helper: Don/doff right sock, Don/doff left sock                  Lower body assist Assist for lower body dressing: 2 Helpers      Toileting Toileting Toileting activity did not occur: No continent bowel/bladder event        Toileting assist     Transfers Chair/bed transfer   Chair/bed transfer method: Stand pivot Chair/bed transfer assist level: Total assist (Pt < 25%) Chair/bed transfer assistive device: Mechanical lift Mechanical lift: Stedy    Locomotion Ambulation Ambulation activity did not occur: Safety/medical concerns         Wheelchair   Type: Manual Max wheelchair distance: 150 Assist Level: Dependent (Pt equals 0%)  Cognition Comprehension Comprehension assist level: Understands basic 50 - 74% of the time/ requires cueing 25 - 49% of the time  Expression Expression assist level: Expresses basic 50 - 74% of the time/requires cueing 25 - 49% of the time. Needs to repeat parts of sentences.  Social Interaction Social Interaction assist level: Interacts appropriately 50 - 74% of the time - May be physically or verbally inappropriate.  Problem Solving Problem solving assist level: Solves basic 25 - 49% of the time - needs direction more than half the time to initiate, plan or complete simple activities  Memory Memory assist level: Recognizes or recalls 25 - 49% of the time/requires cueing 50 - 75% of the time    Medical Problem List and Plan: 1.  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  DVT Prophylaxis/Anticoagulation: Mechanical: Sequential compression devices, below knee Bilateral lower extremities 3. Pain Management: Tylenol prn 4. Mood: LCSW to follow for evaluation and support.  5. Neuropsych: This patient is not fully capable of making decisions on her own behalf. 6. Skin/Wound Care: Ordered air mattress overlay due to malnutrition, poor intake   Stage 2 sacral ulcer, pressure relief, protein supplementation 7. Fluids/Electrolytes/Nutrition:  Continue dextrose for now.   Soft diet at present, will advance as tolerated 8. HIV/AIDS with MAC?:  On descovy, ticovey, biaxin and ethambutol.   follow up per ID. Appreciate recs 9. Candida/CMV esophagitis: IV diflucan since 7/21 and On Ganciclovir since 08/03 10. FTT: Continue IVF for now. Started calorie count .   11. Pancytopenia:  Check labs - anticipate drop in numbers 5-7 days after Granix 12.  Hyponatremia/Hypokalemia: Monitor lytes with Mg and Phos levels every 3-4 days.   Na+ 134 on 8/7: Will cont to monitor, treat if necessary  K+ 3.7 on 8/7: Repleted  Cont to monitor 13. PCP prophylaxis: Bactrim discontinued due to hyponatremia. Has been treated for PCP infection and dapsone resumed 8/4  for prophylaxis.  14. Hypoalbuminemia  Supplement started 8/6 15. Leukocytosis: resolved, secondary to medication 16. Anemia of chronic disease  Hb 8.7 on 8/8  LOS (Days) 4 A FACE TO FACE EVALUATION WAS PERFORMED  Kryslyn Helbig Karis Jubanil Moishe Schellenberg 11/29/2015 9:51 AM

## 2015-11-30 ENCOUNTER — Inpatient Hospital Stay (HOSPITAL_COMMUNITY): Payer: Managed Care, Other (non HMO) | Admitting: Speech Pathology

## 2015-11-30 ENCOUNTER — Inpatient Hospital Stay (HOSPITAL_COMMUNITY): Payer: Managed Care, Other (non HMO) | Admitting: Occupational Therapy

## 2015-11-30 ENCOUNTER — Inpatient Hospital Stay (HOSPITAL_COMMUNITY): Payer: Managed Care, Other (non HMO) | Admitting: Physical Therapy

## 2015-11-30 DIAGNOSIS — A310 Pulmonary mycobacterial infection: Secondary | ICD-10-CM

## 2015-11-30 DIAGNOSIS — Z7189 Other specified counseling: Secondary | ICD-10-CM

## 2015-11-30 DIAGNOSIS — R630 Anorexia: Secondary | ICD-10-CM

## 2015-11-30 DIAGNOSIS — R110 Nausea: Secondary | ICD-10-CM

## 2015-11-30 LAB — CBC WITH DIFFERENTIAL/PLATELET
BASOS ABS: 0 10*3/uL (ref 0.0–0.1)
BASOS PCT: 1 %
EOS ABS: 0 10*3/uL (ref 0.0–0.7)
Eosinophils Relative: 1 %
HCT: 30.4 % — ABNORMAL LOW (ref 36.0–46.0)
Hemoglobin: 9.8 g/dL — ABNORMAL LOW (ref 12.0–15.0)
LYMPHS PCT: 22 %
Lymphs Abs: 0.9 10*3/uL (ref 0.7–4.0)
MCH: 30.8 pg (ref 26.0–34.0)
MCHC: 32.2 g/dL (ref 30.0–36.0)
MCV: 95.6 fL (ref 78.0–100.0)
MONO ABS: 0.6 10*3/uL (ref 0.1–1.0)
Monocytes Relative: 14 %
NEUTROS PCT: 62 %
Neutro Abs: 2.8 10*3/uL (ref 1.7–7.7)
PLATELETS: 193 10*3/uL (ref 150–400)
RBC: 3.18 MIL/uL — ABNORMAL LOW (ref 3.87–5.11)
RDW: 17.9 % — ABNORMAL HIGH (ref 11.5–15.5)
WBC: 4.3 10*3/uL (ref 4.0–10.5)

## 2015-11-30 MED ORDER — DRONABINOL 2.5 MG PO CAPS
5.0000 mg | ORAL_CAPSULE | Freq: Two times a day (BID) | ORAL | Status: DC
  Administered 2015-12-01 – 2015-12-19 (×38): 5 mg via ORAL
  Filled 2015-11-30 (×38): qty 2

## 2015-11-30 NOTE — Progress Notes (Signed)
Sam[facility] K[facility] PHYSICAL MEDICINE & REHABILITATION     PROGRESS NOTE  Subjective/Complaints:  Pt laying in bed.  She appears more alert today and is more interactive.  She states she ate better yesterday.  ROS: +Fatigued. Denies CP, SOB, N/V/D.  Objective: Vital Signs: Blood pressure 120/78, pulse 81, temperature 97.2 F (36.2 C), temperature source Oral, resp. rate 17, height 5\' 4"  (1.626 m), weight 63 kg (138 lb 14.6 oz), SpO2 98 %. No results found.  Recent Labs  11/29/15 0705 11/30/15 0641  WBC 4.1 4.3  HGB 8.7* 9.8*  HCT 26.0* 30.4*  PLT 183 193    Recent Labs  11/28/15 1027  NA 134*  K 3.7  CL 107  GLUCOSE 97  BUN 8  CREATININE 0.75  CALCIUM 7.3*   CBG (last 3)  No results for input(s): GLUCAP in the last 72 hours.  Wt Readings from Last 3 Encounters:  11/28/15 63 kg (138 lb 14.6 oz)  11/23/15 60.6 kg (133 lb 11.2 oz)  10/31/15 58.6 kg (129 lb 3 oz)    Physical Exam:  BP 120/78 (BP Location: Right Arm)   Pulse 81   Temp 97.2 F (36.2 C) (Oral)   Resp 17   Ht 5\' 4"  (1.626 m)   Wt 63 kg (138 lb 14.6 oz)   SpO2 98%   BMI 23.84 kg/m  Constitutional: Vital signs are normal. NAD. She appears cachectic. She has a sickly appearance.  HENT: Normocephalic and atraumatic.  Mouth/Throat: Oral lesions present.  Eyes: Conjunctivae and EOM are normal.  Cardiovascular: Regular rate and rhythm. No murmur heard. Respiratory: +[place]. Effort normal and breath sounds normal. No stridor. No respiratory distress. She has no wheezes.  GI: Soft. Bowel sounds are normal. She exhibits no distension. There is no tenderness.  Musculoskeletal: She exhibits no edema or tenderness.  Muscle wasting all limbs.  Neurological: She appears listless (Improved).  Motor:  B/l UE: 3/5 deltoid, biceps, triceps, 3+ wrist and HI.  B/l LE: 2+/5 HF, 2+ KE/KF and 3+/5 ADF/PF (poor tolerance).  Skin: Skin is warm and dry. No rash noted. No erythema.  6 small stage 2 ulcers sacrum  area. Psychiatric: Her affect is blunt. She is slowed and withdrawn. Cognition and memory are impaired.   Assessment/Plan: 1. 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 Physiatrist is providing close team supervision and 24 hour management of active medical problems listed below. Physiatrist and rehab team continue to assess barriers to discharge/monitor patient progress toward functional and medical goals.  Function:  Bathing Bathing position   Position: Wheelchair/chair at sink  Bathing parts Body parts bathed by patient: Right arm, Left arm, Chest Body parts bathed by helper: Abdomen, Front perineal area, Buttocks, Right upper leg, Left upper leg, Right lower leg, Left lower leg  Bathing assist Assist Level: 2 helpers      Upper Body Dressing/Undressing Upper body dressing   What is the patient wearing?: Button up shirt         Button up shirt - Perfomed by patient: Thread/unthread left sleeve Button up shirt - Perfomed by helper: Thread/unthread right sleeve, Pull shirt around back, Button/unbutton shirt    Upper body assist Assist Level: More than reasonable time, Set up (MAX A)   Set up : To obtain clothing/put away  Lower Body Dressing/Undressing Lower body dressing   What is the patient wearing?: Pants, Non-skid slipper socks   Underwear -  Performed by helper: Thread/unthread right underwear leg, Thread/unthread left underwear leg, Pull underwear up/down   Pants- Performed by helper: Thread/unthread right pants leg, Thread/unthread left pants leg, Pull pants up/down   Non-skid slipper socks- Performed by helper: Don/doff left sock, Don/doff right sock                  Lower body assist Assist for lower body dressing: 2 Helpers      Toileting Toileting Toileting activity did not occur: No continent bowel/bladder event        Toileting assist      Transfers Chair/bed transfer Chair/bed transfer activity did not occur: Safety/medical concerns Chair/bed transfer method: Other Chair/bed transfer assist level: Maximal assist (Pt 25 - 49%/lift and lower) Chair/bed transfer assistive device: Mechanical lift Mechanical lift: Stedy   Locomotion Ambulation Ambulation activity did not occur: Safety/medical concerns         Wheelchair   Type: Manual Max wheelchair distance: 150 Assist Level: Dependent (Pt equals 0%)  Cognition Comprehension Comprehension assist level: Understands basic 50 - 74% of the time/ requires cueing 25 - 49% of the time  Expression Expression assist level: Expresses basic 25 - 49% of the time/requires cueing 50 - 75% of the time. Uses single words/gestures.  Social Interaction Social Interaction assist level: Interacts appropriately 50 - 74% of the time - May be physically or verbally inappropriate.  Problem Solving Problem solving assist level: Solves basic 25 - 49% of the time - needs direction more than half the time to initiate, plan or complete simple activities  Memory Memory assist level: Recognizes or recalls 25 - 49% of the time/requires cueing 50 - 75% of the time    Medical Problem List and Plan: 1.  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  DVT Prophylaxis/Anticoagulation: Mechanical: Sequential compression devices, below knee Bilateral lower extremities 3. Pain Management: Tylenol prn 4. Mood: LCSW to follow for evaluation and support.  5. Neuropsych: This patient is not fully capable of making decisions on her own behalf. 6. Skin/Wound Care: Ordered air mattress overlay due to malnutrition, poor intake   Stage 2 sacral ulcer, pressure relief, protein supplementation 7. Fluids/Electrolytes/Nutrition:  Continue dextrose for now.   Soft diet at present, will advance as tolerated 8. HIV/AIDS with MAC?:  On descovy, ticovey, biaxin and  ethambutol.   Follow up per ID. Appreciate recs 9. Candida/CMV esophagitis: Diflucan and On Ganciclovir since 08/03 10. FTT: Continue IVF for now. Started calorie count .   11. Pancytopenia:  Check labs periodically given medications 12. Hyponatremia/Hypokalemia: Monitor lytes with Mg and Phos levels every 3-4 days.   Na+ 134 on 8/7: Will cont to monitor, treat if necessary  K+ 3.7 on 8/7: Repleted  Cont to monitor 13. PCP prophylaxis: Bactrim discontinued due to hyponatremia. Has been treated for PCP infection and dapsone resumed 8/4  for prophylaxis.  14. Hypoalbuminemia  Supplement started 8/6 15. Leukocytosis: resolved, secondary to medication 16. Anemia of chronic disease  Hb 9.8 on 8/9  Cont to monitor  LOS (Days) 5 A FACE TO FACE EVALUATION WAS PERFORMED  Samantha Kline 11/30/2015 9:04 AM

## 2015-11-30 NOTE — Progress Notes (Signed)
Pt's plan of care adjusted to 15/7 after speaking with care team and discussed with MD in team conference as pt currently unable to tolerate current therapy schedule with OT, PT, and SLP.   

## 2015-11-30 NOTE — Progress Notes (Signed)
Occupational Therapy Session Note  Patient Details  Name: Samantha Kline MRN: [id number] Date of Birth: [birth year]  Today's Date: 11/30/2015 OT Individual Time: [id number] OT Individual Time Calculation (min): 55 min   Short Term Goals: Week 1:  OT Short Term Goal 1 (Week 1): Pt will complete functional toilet transfer with Max A OT Short Term Goal 2 (Week 1): Pt will complete UB bathing with Min A OT Short Term Goal 3 (Week 1): Pt will complete sit to stand for LB bathing with Max A OT Short Term Goal 4 (Week 1): Pt will engage in 5 minutes of therapeutic activity without rest to increase activity tolerance for self care completion   Skilled Therapeutic Interventions/Progress Updates:   Pt greeted sitting up in bed with NA present assisting with feeding. Pt take a few more bites where she was able to manipulate feeding utensil and bring to mouth with increased time and effort. Pt completed bed mobility with Max A for sup<>sit. She required Mod>Max A to maintain sitting balance while OT assisted pt with threading B LE's through pants. Antony SalmonStedy was then used to assist pt into standing, where she required total assist to pull pants up over hips. Pt was then brought over to sink in [place], but she was unable to achieve upright posture and maintain trunk control in order to reach for objects at the sink. Pt reported max fatigue and requested to return to bed. With encouragement, pt agreeable to sitting up in w/c. Pt came to standing in [place] with Max A and transferred to tilt-in-space w/c.  Safety belt secured around pt and call bell left within reach.   Therapy Documentation Precautions:  Precautions Precautions: Fall Restrictions Weight Bearing Restrictions: No   Pain: Pain Assessment Pain Score: 6  Pain Type: Acute pain Pain Location: Buttocks Pain Intervention(s): Repositioned ADL: ADL Lower Body Dressing: Dependent Where Assessed-Lower Body Dressing: Edge of bed  See Function Navigator  for Current Functional Status.   Therapy/Group: Individual Therapy  Mal Amabilelisabeth S Kayse Puccini 11/30/2015, 11:46 AM

## 2015-11-30 NOTE — Progress Notes (Signed)
   11/30/15 1000  Clinical Encounter Type  Visited With Patient  Visit Type Spiritual support  Referral From Nurse  Spiritual Encounters  Spiritual Needs Prayer;Emotional  Stress Factors  Patient Stress Factors Exhausted;Family relationships  Chaplain had long visit with patient, whose main concern is exhaustion. She also experiences racing thoughts and an inability to relax and is worried about her sons, one of whom has pancreatitis. She indicated that she is not ready to die and hopes that a new medicine will give her more energy. She was watching a religious program on TV when chaplain arrived, and she expressed that she finds great comfort in God's presence, although her pastor has not been able to visit her. Antjuan Rothe, Chaplain

## 2015-11-30 NOTE — Progress Notes (Signed)
Speech Language Pathology Daily Session Note  Patient Details  Name: Samantha Kline MRN: [id number] Date of Birth: 1960-10-20  Today's Date: 11/30/2015 SLP Individual Time: 1445-1530 SLP Individual Time Calculation (min): 45 min   Short Term Goals: Week 1: SLP Short Term Goal 1 (Week 1): Patient will initiate self-care tasks in under 30 seconds with no more than 2 verbal cues  SLP Short Term Goal 2 (Week 1): Patient will initiate verbal expression of basic wants and needs with Mod question cues  SLP Short Term Goal 3 (Week 1): Patient will problem solve use of call bell with Mod assist verbal and visual cues  SLP Short Term Goal 4 (Week 1): Patient will utilize external aids to recall daily information with Mod assist cues SLP Short Term Goal 5 (Week 1): Patient will demonstrate sustained attention to familiar tasks for 5 minutes with Mod verbal cues for redirection  SLP Short Term Goal 6 (Week 1): Patient will demonstrate effective mastication and oral clearance of soft solid textures with Mod verbal cues  Skilled Therapeutic Interventions: Pt was seen for skilled ST targeting cognitive goals.  Pt awake and slightly more interactive today in comparison to previous therapy sessions; however, pt remains profoundly fatigued which impacts her progress in therapies.  Therapist facilitated the session with a basic card game targeting sustained attention to task, during which pt required max assist multimodal cues for initiation.  Due to fatigue and cognition, pt was only able to sustain her attention to task for ~30 seconds before requiring max assist multimodal cues for redirection to task.  Pt required mod assist verbal and visual cues to utilize calendar to recall today's date.  Pt's husband arrived at the end of today's therapy session and SLP provided brief education regarding pt's current limitations and goals for ST.  All questions were answered to husband's satisfaction at this time. Of note, upon  pt's husband's arrival pt reported wanting "something to eat" and requested lemonade.  Pt consumed ~2 bites of pudding and 1 sip of lemonade before declining further presentations despite encouragement from SLP to continue eating.  Pt was left in recliner with husband at bedside.  Continue per current plan of care.    Function:  Eating Eating             Cognition Comprehension Comprehension assist level: Understands basic 50 - 74% of the time/ requires cueing 25 - 49% of the time  Expression   Expression assist level: Expresses basic 25 - 49% of the time/requires cueing 50 - 75% of the time. Uses single words/gestures.  Social Interaction Social Interaction assist level: Interacts appropriately 50 - 74% of the time - May be physically or verbally inappropriate.  Problem Solving Problem solving assist level: Solves basic less than 25% of the time - needs direction nearly all the time or does not effectively solve problems and may need a restraint for safety  Memory Memory assist level: Recognizes or recalls 25 - 49% of the time/requires cueing 50 - 75% of the time    Pain Pain Assessment Pain Assessment: No/denies pain  Therapy/Group: Individual Therapy  Aniyia Rane, [name] 11/30/2015, 3:54 PM

## 2015-11-30 NOTE — Progress Notes (Signed)
Physical Therapy Session Note  Patient Details  Name: Samantha Kline MRN: [id number] Date of Birth: 1960/11/26  Today's Date: 11/30/2015 PT Individual Time: 1000-1055 AND 1635-1700 PT Individual Time Calculation (min): 55 min AND 25 min    Short Term Goals: Week 1:  PT Short Term Goal 1 (Week 1): Pt will maintain static and dynamic sitting balance in midline with mod A PT Short Term Goal 2 (Week 1): Pt will perform bed mobility with max A with pt initiating 25% of transfer sequence PT Short Term Goal 3 (Week 1): Pt will perform bed <> w/c transfers with consistent max A of one person and initiating 25% of transfer PT Short Term Goal 4 (Week 1): Pt will tolerate 1-2 hours up in reclining w/c to improve core strength and positioning for meals PT Short Term Goal 5 (Week 1): Pt will initiate pre-gait activities in standing with max A  Skilled Therapeutic Interventions/Progress Updates:     Session 1:   Patient received sitting in WC and agreeble to PT.  Patient reports increased dizziness with transition to upright positioning from reclined position in tilt in space WC. PT performed BP assessment 135/85 in sitting. Patient performed sit<>stand at stedy with max A from PT max multi modal cues for UE placement. Patient reported increased dizziness with sat on stedy seat. BP re-assessed 113/73. Sit<>stand in stedy from Max A +[facility] Patient required max cues for UE placement on grab bar and increased anterior weight shift to come to standing.   PT attempted additional sit<>stand at stedy x 2 with max A but patient unable to achieve full upright position.    Patient reports significant increase in buttock pain after sitting upright for ~4210minutes, but difficulty verbalizing caus of pain to PT. PT returned patient to semirecumbent position which reduced pain.     Seated therex LAQ  x8 BLE AROM Ankle pumps x 12 BLE with manual resistance Hip flexion x 8 BLE with AAROM Hip extension x 8  BLE against manual resistance.  Hip abduction x 10 BLE with Manual resistance.   Patient left sitting in WC with call bel in reach.   Session 2:  Seated therex. From increased inclined position, with ince resistance from gravity compared to earlier treatment.  Hip Fleixon AROM x 5 BLE.  Ankle DF x 10 BLE  LAQ AAROM x 5 BLE  Hip adduction x 8 BLE  Hip abduction x 8 BLE.  Patient noted to required significantly more cues and encouragement for increased participation in afternoon session, compared to AM session.  Patient declined transfer training or to return to bed for bed mobility.    PT educated patient on the importance of nutrition for improved endurance and strength to improve with PT goals and return home.   Patient left sitting in WC with quick release belt and call bell in reach.   Therapy Documentation Precautions:  Precautions Precautions: Fall Restrictions Weight Bearing Restrictions: No    Pain: Pain Assessment Pain Score: 6  Pain Type: Acute pain Pain Location: Buttocks Pain Intervention(s): Repositioned   See Function Navigator for Current Functional Status.   Therapy/Group: Individual Therapy  Golden Popustin E Emanuelle Hammerstrom 11/30/2015, 12:44 PM

## 2015-11-30 NOTE — Plan of Care (Signed)
Pt's plan of care adjusted to 15/7 after speaking with care team and discussed with MD in team conference as pt currently unable to tolerate current therapy schedule with OT, PT, and SLP.   

## 2015-11-30 NOTE — Plan of Care (Signed)
Problem: RH BLADDER ELIMINATION Goal: RH STG MANAGE BLADDER WITH ASSISTANCE STG Manage Bladder With mod Assistance  Outcome: Not Progressing Foley

## 2015-11-30 NOTE — Progress Notes (Addendum)
Subjective:  She is having a hard time eating enough food based on my conversations with her and her husband  Antibiotics:  Anti-infectives    Start     Dose/Rate Route Frequency Ordered Stop   11/29/15 1545  fluconazole (DIFLUCAN) tablet 200 mg     200 mg Oral Daily 11/29/15 1532     11/26/15 1600  fluconazole (DIFLUCAN) IVPB 400 mg  Status:  Discontinued     400 mg 100 mL/hr over 120 Minutes Intravenous Every 24 hours 11/25/15 1726 11/25/15 1819   11/26/15 1200  dolutegravir (TIVICAY) tablet 50 mg     50 mg Oral Daily 11/25/15 1726     11/26/15 1200  emtricitabine-tenofovir AF (DESCOVY) 200-25 MG per tablet 1 tablet     1 tablet Oral Daily 11/25/15 1726     11/26/15 0800  dapsone tablet 100 mg     100 mg Oral Daily 11/25/15 1726     11/26/15 0800  ethambutol (MYAMBUTOL) tablet 900 mg  Status:  Discontinued    Comments:  Adjust dose as needed   900 mg Oral Daily 11/25/15 1726 11/30/15 1731   11/25/15 2000  clarithromycin (BIAXIN) tablet 500 mg  Status:  Discontinued    Comments:  Can use suspension if she has trouble swallowing   500 mg Oral Every 12 hours 11/25/15 1726 11/30/15 1731   11/25/15 1800  ganciclovir (CYTOVENE) 150 mg in sodium chloride 0.9 % 100 mL IVPB     2.5 mg/kg  60.6 kg 100 mL/hr over 60 Minutes Intravenous Every 12 hours 11/25/15 1726        Medications: Scheduled Meds: . amitriptyline  10 mg Oral QHS  . antiseptic oral rinse  7 mL Mouth Rinse BID  . dapsone  100 mg Oral Daily  . dimethicone   Topical TID PC  . dolutegravir  50 mg Oral Q1200  . dronabinol  2.5 mg Oral BID AC  . emtricitabine-tenofovir AF  1 tablet Oral Q1200  . feeding supplement (ENSURE ENLIVE)  237 mL Oral QID  . feeding supplement (PRO-STAT SUGAR FREE 64)  30 mL Oral Q1500  . ferrous sulfate  325 mg Oral Q breakfast  . fluconazole  200 mg Oral Daily  . ganciclovir (CYTOVENE) IV  2.5 mg/kg Intravenous Q12H  . Gerhardt's butt cream   Topical BID  . metoprolol tartrate   12.5 mg Oral BID  . multivitamin with minerals  1 tablet Oral Daily  . potassium chloride  20 mEq Oral BID  . protein supplement  1 scoop Oral TID WC  . vitamin B-12  100 mcg Oral Daily  . [START ON 12/02/2015] Vitamin D (Ergocalciferol)  50,000 Units Oral Q Fri   Continuous Infusions:   PRN Meds:.acetaminophen, albuterol, alum & mag hydroxide-simeth, bisacodyl, dextromethorphan-guaiFENesin, diphenhydrAMINE, ondansetron, prochlorperazine **OR** prochlorperazine **OR** prochlorperazine, senna-docusate, sodium phosphate, traZODone    Objective: Weight change:   Intake/Output Summary (Last 24 hours) at 11/30/15 1732 Last data filed at 11/30/15 1230  Gross per 24 hour  Intake              220 ml  Output             1200 ml  Net             -980 ml   Blood pressure 113/68, pulse 95, temperature 97.6 F (36.4 C), temperature source Oral, resp. rate 18, height 5\' 4"  (1.626 m), weight 138 lb 14.6 oz (63  kg), SpO2 96 %. Temp:  [97.2 F (36.2 C)-97.6 F (36.4 C)] 97.6 F (36.4 C) (08/09 1412) Pulse Rate:  [81-95] 95 (08/09 1412) Resp:  [17-18] 18 (08/09 1412) BP: (113-120)/(68-78) 113/68 (08/09 1412) SpO2:  [96 %-98 %] 96 % (08/09 1412)  Physical Exam: General: weak but awake and oriented to person place HEENT: anicteric sclera, pupils reactive to light and accommodation, EOMI she appears to have thrush on her tongue CVS tachycardic rate, normal r,  no murmur rubs or gallops Chest: clear to auscultation bilaterally, no wheezing, rales or rhonchi Abdomen: soft not especially tender, nondistended, normal bowel sounds, Extremities: no  clubbing or edema noted bilaterally Skin: no rashes Neuro: nonfocal  CBC: CBC Latest Ref Rng & Units 11/30/2015 11/29/2015 11/26/2015  WBC 4.0 - 10.5 K/uL 4.3 4.1 14.4(H)  Hemoglobin 12.0 - 15.0 g/dL 2.9(F) 6.2(Z) 3.0(Q)  Hematocrit 36.0 - 46.0 % 30.4(L) 26.0(L) 29.1(L)  Platelets 150 - 400 K/uL 193 183 193      BMET  Recent Labs  11/28/15 1027    NA 134*  K 3.7  CL 107  CO2 24  GLUCOSE 97  BUN 8  CREATININE 0.75  CALCIUM 7.3*     Liver Panel  No results for input(s): PROT, ALBUMIN, AST, ALT, ALKPHOS, BILITOT, BILIDIR, IBILI in the last 72 hours.     Sedimentation Rate No results for input(s): ESRSEDRATE in the last 72 hours. C-Reactive Protein No results for input(s): CRP in the last 72 hours.  Micro Results: Recent Results (from the past 720 hour(s))  Culture, blood (Routine x 2)     Status: None   Collection Time: 11/06/15  8:30 PM  Result Value Ref Range Status   Specimen Description BLOOD LEFT ARM  Final   Special Requests IN PEDIATRIC BOTTLE 2CC  Final   Culture NO GROWTH 5 DAYS  Final   Report Status 11/11/2015 FINAL  Final  Culture, blood (Routine x 2)     Status: None   Collection Time: 11/06/15  8:45 PM  Result Value Ref Range Status   Specimen Description BLOOD LEFT ARM  Final   Special Requests BOTTLES DRAWN AEROBIC AND ANAEROBIC 4CC   Final   Culture NO GROWTH 5 DAYS  Final   Report Status 11/11/2015 FINAL  Final  Urine culture     Status: None   Collection Time: 11/06/15 10:36 PM  Result Value Ref Range Status   Specimen Description URINE, CATHETERIZED  Final   Special Requests NONE  Final   Culture NO GROWTH  Final   Report Status 11/08/2015 FINAL  Final  MRSA PCR Screening     Status: None   Collection Time: 11/09/15 11:54 AM  Result Value Ref Range Status   MRSA by PCR NEGATIVE NEGATIVE Final    Comment:        The GeneXpert MRSA Assay (FDA approved for NASAL specimens only), is one component of a comprehensive MRSA colonization surveillance program. It is not intended to diagnose MRSA infection nor to guide or monitor treatment for MRSA infections.   Culture, blood (routine x 2)     Status: None   Collection Time: 11/11/15  4:50 PM  Result Value Ref Range Status   Specimen Description BLOOD LEFT ANTECUBITAL  Final   Special Requests BOTTLES DRAWN AEROBIC ONLY 5CC  Final    Culture NO GROWTH 5 DAYS  Final   Report Status 11/16/2015 FINAL  Final  Culture, blood (routine x 2)     Status:  None   Collection Time: 11/11/15  4:55 PM  Result Value Ref Range Status   Specimen Description BLOOD BLOOD LEFT ARM  Final   Special Requests IN PEDIATRIC BOTTLE 1CC  Final   Culture NO GROWTH 5 DAYS  Final   Report Status 11/16/2015 FINAL  Final  Acid Fast Smear (AFB)     Status: None   Collection Time: 11/11/15  8:11 PM  Result Value Ref Range Status   AFB Specimen Processing Concentration  Final    Comment: (NOTE) Performed At: [facility] [address] [place], [place] [id number] Mila HomerHancock William F MD ZH:[id number]:[phone]    Acid Fast Smear NOSMR  Final    Comment: (NOTE) The acid-fast bacilli (AFB) smear will not be performed due to the specimen source (blood) or submission of an insufficient quantity of specimen.    Source (AFB) BLOOD  Corrected  Culture, blood (routine x 2)     Status: None   Collection Time: 11/17/15  1:10 AM  Result Value Ref Range Status   Specimen Description BLOOD RIGHT ANTECUBITAL  Final   Special Requests BOTTLES DRAWN AEROBIC AND ANAEROBIC 10CC,5CC  Final   Culture NO GROWTH 5 DAYS  Final   Report Status 11/22/2015 FINAL  Final  Culture, blood (routine x 2)     Status: None   Collection Time: 11/17/15  1:23 AM  Result Value Ref Range Status   Specimen Description BLOOD RIGHT HAND  Final   Special Requests IN PEDIATRIC BOTTLE 3CC  Final   Culture NO GROWTH 5 DAYS  Final   Report Status 11/22/2015 FINAL  Final  Hsv Culture And Typing     Status: None   Collection Time: 11/23/15  2:55 PM  Result Value Ref Range Status   HSV Culture/Type Comment  Final    Comment: (NOTE) Negative No Herpes simplex virus isolated. Performed At: [facility] [address] [place], [place] [id number] Mila HomerHancock William F MD [id number]:[phone]    Source of Sample ESOPHAGUS  Final  Cytomegalovirus (CMV) Culture     Status: None   Collection  Time: 11/23/15  2:55 PM  Result Value Ref Range Status   Cytomegalovirus (CMV) Culture Comment  Final    Comment: (NOTE) No Cytomegalovirus detected in the shell vial culture. Conventional tissue culture results to follow. Performed At: [facility] [address] [place], [place] [id number] Mila HomerHancock William F MD ZD:[id number]:[phone]    Source of Sample ESOPHEGUS  Final    Studies/Results: No results found.    Assessment/Plan:  INTERVAL HISTORY:   Dysphagia persists  11/23/15: EGD performed by Dr. Ewing SchleinMagod (greatly appreciate this) and specimen sent to path but I cannot locate any PCR's for CMV or HSV EGD did show ulcers  8/3-->11/29/15 patient started on gancyclovir. CMV not detected on shell vial culture (would have preferred PCRs), trasnferred to Rehab  11/30/15: having trouble getting sufficient nutrition, appetite poor  Principal Problem:   Physical debility Active Problems:   Human immunodeficiency virus (HIV) disease (HCC)   Acute encephalopathy   HIV encephalopathy (HCC)   Benign essential HTN   Dysphagia   Antineoplastic chemotherapy induced pancytopenia (HCC)   Drug-induced folate deficiency anemia   Leukocytosis   Stage 2 skin ulcer of sacral region   Mycobacterium avium complex (HCC)   Thrush   Palliative care encounter   Goals of care, counseling/discussion    Samantha Kline is a 55 y.o. female with  Newly dx HIV/AIDS rx for PCP admitted with FTT, esophagitis, fever,  started on empiric rx for M avium, fluconazole for possible esophageal candidiasis.   #1 Poor appetite: I am going to DC her ETH and Biaxin as we have never proven M avium infection and the patient may be suffering SE from these meds. 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 She should still have her CBC checked every few days  #3  Possible M avium infection :  Fevers broke with empiric rx, AFB cultures will incubate for 6 weeks from blood Above I'm stopping her therapy since it may be impacting her appetite  #4 HIV/AIDS: she has started Tivicay and Descovy a regimen that should afford her rapid virological suppression while it will give higher risk for IRIS and indeed her VL has dropped from 200K to 200 in only 3 weeks!   Lab Results  Component Value Date   HIV1RNAQUANT 200 11/16/2015   Lab Results  Component Value Date   CD4TABS 20 (L) 11/16/2015   #5Candida: she has had 2 weeks of very high dose azole and DC it but tongue with evidence of thrush so weill go back to fluconazole 200mg  daily  #6 Goals of Care: IMO they should clearly be eradication of known infections, restoration of physical health and immune function. There is not a biological reason why she should not be able to recover at this point. I do worry about the pharmacy impacting her diet. The ulcers in the esophagus also may be a significant problem and they were not discovered until she had an EGD done which was fairly late during a protracted hospital course. There is also the possibility ulcers or not due to CMV better do to HIV itself and in that situation may take longer to resolve with protracted intervertebral therapy and consideration of other modalities certainly if she has further complaints of dysphagia would have a repeat EGD done in the next week or 2.  I spent greater than 40 minutes with the patient including greater than 50% of time in face to face counsel of the patient and her husband about her HIV, AIDS, CMV infection thrush, possible M avium, FTT and in coordination of her care.     LOS: 5 days   Acey Lav 11/30/2015, 5:32 PM

## 2015-11-30 NOTE — Progress Notes (Signed)
Social Work Patient ID: Samantha Kline, female   DOB: 1960-08-05, 55 y.o.   MRN: [id number]  Have left husband tow messages and am awaiting return call. Pt looks better and brighter today, she feels a tiny bit better. Will work on discharge plans and pt is aware she will require 24 hr care at discharge. Hope pt feels a little better each day.

## 2015-12-01 ENCOUNTER — Inpatient Hospital Stay (HOSPITAL_COMMUNITY): Payer: Managed Care, Other (non HMO) | Admitting: Speech Pathology

## 2015-12-01 ENCOUNTER — Inpatient Hospital Stay (HOSPITAL_COMMUNITY): Payer: Managed Care, Other (non HMO) | Admitting: Occupational Therapy

## 2015-12-01 ENCOUNTER — Inpatient Hospital Stay (HOSPITAL_COMMUNITY): Payer: Managed Care, Other (non HMO)

## 2015-12-01 ENCOUNTER — Encounter (HOSPITAL_COMMUNITY): Payer: Self-pay | Admitting: *Deleted

## 2015-12-01 LAB — CBC WITH DIFFERENTIAL/PLATELET
Basophils Absolute: 0.1 10*3/uL (ref 0.0–0.1)
Basophils Relative: 1 %
Eosinophils Absolute: 0 10*3/uL (ref 0.0–0.7)
Eosinophils Relative: 0 %
HCT: 29.8 % — ABNORMAL LOW (ref 36.0–46.0)
Hemoglobin: 9.5 g/dL — ABNORMAL LOW (ref 12.0–15.0)
Lymphocytes Relative: 25 %
Lymphs Abs: 1.3 10*3/uL (ref 0.7–4.0)
MCH: 30.2 pg (ref 26.0–34.0)
MCHC: 31.9 g/dL (ref 30.0–36.0)
MCV: 94.6 fL (ref 78.0–100.0)
Monocytes Absolute: 0.5 10*3/uL (ref 0.1–1.0)
Monocytes Relative: 10 %
Neutro Abs: 3.4 10*3/uL (ref 1.7–7.7)
Neutrophils Relative %: 64 %
Platelets: 215 10*3/uL (ref 150–400)
RBC: 3.15 MIL/uL — ABNORMAL LOW (ref 3.87–5.11)
RDW: 17.9 % — ABNORMAL HIGH (ref 11.5–15.5)
WBC: 5.3 10*3/uL (ref 4.0–10.5)

## 2015-12-01 LAB — CD4/CD8 (T-HELPER/T-SUPPRESSOR CELL)
CD4 absolute: 40 /uL — ABNORMAL LOW (ref 500–1900)
CD4%: 3 % — ABNORMAL LOW (ref 30.0–60.0)
CD8 T CELL ABS: 780 /uL (ref 230–1000)
CD8tox: 64 % — ABNORMAL HIGH (ref 15.0–40.0)
RATIO: 0.1 — AB (ref 1.0–3.0)
TOTAL LYMPHOCYTE COUNT: 1229 /uL (ref 1000–4000)

## 2015-12-01 LAB — COMPREHENSIVE METABOLIC PANEL
ALT: 14 U/L (ref 14–54)
AST: 26 U/L (ref 15–41)
Albumin: 1.3 g/dL — ABNORMAL LOW (ref 3.5–5.0)
Alkaline Phosphatase: 94 U/L (ref 38–126)
Anion gap: 3 — ABNORMAL LOW (ref 5–15)
BUN: 11 mg/dL (ref 6–20)
CO2: 26 mmol/L (ref 22–32)
Calcium: 7.9 mg/dL — ABNORMAL LOW (ref 8.9–10.3)
Chloride: 102 mmol/L (ref 101–111)
Creatinine, Ser: 0.79 mg/dL (ref 0.44–1.00)
GFR calc Af Amer: >60 mL/min (ref >60)
GFR calc non Af Amer: >60 mL/min (ref >60)
Glucose, Bld: 82 mg/dL (ref 65–99)
Potassium: 4.8 mmol/L (ref 3.5–5.1)
Sodium: 131 mmol/L — ABNORMAL LOW (ref 135–145)
Total Bilirubin: 0.6 mg/dL (ref 0.3–1.2)
Total Protein: 5.7 g/dL — ABNORMAL LOW (ref 6.5–8.1)

## 2015-12-01 LAB — HIV-1 RNA QUANT-NO REFLEX-BLD
HIV 1 RNA Quant: 40 copies/mL
LOG10 HIV-1 RNA: 1.602 {Log_copies}/mL

## 2015-12-01 NOTE — Progress Notes (Signed)
Speech Language Pathology Daily Session Note  Patient Details  Name: Samantha Kline MRN: [id number] Date of Birth: January 20, 1961  Today's Date: 12/01/2015 SLP Individual Time: 1420-1445 SLP Individual Time Calculation (min): 25 min   Short Term Goals: Week 1: SLP Short Term Goal 1 (Week 1): Patient will initiate self-care tasks in under 30 seconds with no more than 2 verbal cues  SLP Short Term Goal 2 (Week 1): Patient will initiate verbal expression of basic wants and needs with Mod question cues  SLP Short Term Goal 3 (Week 1): Patient will problem solve use of call bell with Mod assist verbal and visual cues  SLP Short Term Goal 4 (Week 1): Patient will utilize external aids to recall daily information with Mod assist cues SLP Short Term Goal 5 (Week 1): Patient will demonstrate sustained attention to familiar tasks for 5 minutes with Mod verbal cues for redirection  SLP Short Term Goal 6 (Week 1): Patient will demonstrate effective mastication and oral clearance of soft solid textures with Mod verbal cues  Skilled Therapeutic Interventions:  Pt was seen for skilled ST targeting cognitive goals.  Therapist offered pt trials of PO to address dysphagia goals; however, pt politely declined stating "When I'm sick like this I don't want to eat or drink anything."  Pt overall much clearer today in comparison to previous therapy sessions.  Pt able to utilize calendar with supervision to recall current date.  Pt also able to recall details from palliative care visit immediately prior to ST therapy session with min question cues.  Pt was able to sustain her attention to functional conversations with SLP for ~5 minutes with supervision.  Pt was left in recliner at the end of today's therapy session and left with call bell within reach and quick release belt donned.  Continue per current plan of care.   Function:  Eating Eating              Cognition Comprehension Comprehension assist level:  Understands basic 75 - 89% of the time/ requires cueing 10 - 24% of the time  Expression   Expression assist level: Expresses basic 50 - 74% of the time/requires cueing 25 - 49% of the time. Needs to repeat parts of sentences.  Social Interaction Social Interaction assist level: Interacts appropriately 50 - 74% of the time - May be physically or verbally inappropriate.  Problem Solving Problem solving assist level: Solves basic 25 - 49% of the time - needs direction more than half the time to initiate, plan or complete simple activities  Memory Memory assist level: Recognizes or recalls 25 - 49% of the time/requires cueing 50 - 75% of the time    Pain Pain Assessment Pain Assessment: No/denies pain  Therapy/Group: Individual Therapy  Avir Deruiter, [name] 12/01/2015, 4:10 PM

## 2015-12-01 NOTE — Progress Notes (Signed)
Daily Progress Note   Patient Name: Samantha Kline       Date: 12/01/2015 DOB: 1960-11-07  Age: 55 y.o. MRN#: [id number] Attending Physician: Marcello Fennel, MD Primary Care Physician: No PCP Per Patient Admit Date: 11/25/2015  Reason for Consultation/Follow-up: Establishing goals of care  Subjective: Patient is sitting up in a wheel chair.  Still appears weak and fatigued but improved compared to 10/8.  Patients goals are to improve and go home (full scope treatment).  We discuss her husband, two grown sons and 7 grand children.  I feel she is too weak/fatigued to have an in-depth conversation about her understanding of her current condition.  I asked if I could meet with her and her husband together.  She agreed.  I asked her about HCPOA and a living will.  I will try to meet with the patient and her husband early next week to discuss her overall condition and present living will documentation.  Length of Stay: 6  Current Medications: Scheduled Meds:  . amitriptyline  10 mg Oral QHS  . antiseptic oral rinse  7 mL Mouth Rinse BID  . dapsone  100 mg Oral Daily  . dimethicone   Topical TID PC  . dolutegravir  50 mg Oral Q1200  . dronabinol  5 mg Oral BID AC  . emtricitabine-tenofovir AF  1 tablet Oral Q1200  . feeding supplement (ENSURE ENLIVE)  237 mL Oral QID  . feeding supplement (PRO-STAT SUGAR FREE 64)  30 mL Oral Q1500  . ferrous sulfate  325 mg Oral Q breakfast  . fluconazole  200 mg Oral Daily  . ganciclovir (CYTOVENE) IV  2.5 mg/kg Intravenous Q12H  . Gerhardt's butt cream   Topical BID  . metoprolol tartrate  12.5 mg Oral BID  . multivitamin with minerals  1 tablet Oral Daily  . potassium chloride  20 mEq Oral BID  . protein supplement  1 scoop Oral TID WC  . vitamin B-12   100 mcg Oral Daily  . [START ON 12/02/2015] Vitamin D (Ergocalciferol)  50,000 Units Oral Q Fri    Continuous Infusions:    PRN Meds: acetaminophen, albuterol, alum & mag hydroxide-simeth, bisacodyl, dextromethorphan-guaiFENesin, diphenhydrAMINE, ondansetron, prochlorperazine **OR** prochlorperazine **OR** prochlorperazine, senna-docusate, sodium phosphate, traZODone  Physical Exam    Thin, frail female who speaks with  a very soft voice.  Sitting in wheel chair.  Making minimal movements CV rrr Resp nad Abdomen soft thin Ext able to move all 4, no edema.  Vital Signs: BP 117/76 (BP Location: Left Arm)   Pulse 81   Temp 97.3 F (36.3 C) (Oral)   Resp 18   Ht  (1.626 m)   Wt 63 kg (138 lb 14.6 oz)   SpO2 93%   BMI 23.84 kg/m  SpO2: SpO2: 93 % O2 Device: O2 Device: Not Delivered O2 Flow Rate: O2 Flow Rate (L/min): 2 L/min  Intake/output summary:  Intake/Output Summary (Last 24 hours) at 12/01/15 1431 Last data filed at 12/01/15 1210  Gross per 24 hour  Intake              480 ml  Output             2750 ml  Net            -2270 ml   LBM: Last BM Date: 11/29/15 Baseline Weight: Weight: 60.3 kg (133 lb) Most recent weight: Weight: 63 kg (138 lb 14.6 oz)       Palliative Assessment/Data:    Flowsheet Rows   Flowsheet Row Most Recent Value  Intake Tab  Referral Department  -- [CIR]  Unit at Time of Referral  Other (Comment)  Palliative Care Primary Diagnosis  Neurology  Date Notified  11/29/15  Palliative Care Type  New Palliative care  Reason for referral  Clarify Goals of Care  Date of Admission  11/25/15  Date first seen by Palliative Care  11/29/15  # of days Palliative referral response time  0 Day(s)  # of days IP prior to Palliative referral  4  Clinical Assessment  Palliative Performance Scale Score  40%  Psychosocial & Spiritual Assessment  Palliative Care Outcomes  Patient/Family meeting held?  Yes  Who was at the meeting?  patient       Patient Active Problem List   Diagnosis Date Noted  . Anorexia   . Stage 2 skin ulcer of sacral region   . Mycobacterium avium complex (HCC)   . Thrush   . Palliative care encounter   . Goals of care, counseling/discussion   . Leukocytosis   . HIV encephalopathy (HCC)   . Benign essential HTN   . Dysphagia   . Antineoplastic chemotherapy induced pancytopenia (HCC)   . Drug-induced folate deficiency anemia   . Physical debility 11/25/2015  . Esophageal ulcer   . Malnutrition of moderate degree 11/22/2015  . Anemia due to bone marrow failure (HCC)   . Cytomegalovirus (CMV) viremia (HCC)   . Esophagitis, CMV   . FTT (failure to thrive) in adult   . Physical deconditioning   . AIDS (HCC)   . Candida esophagitis (HCC)   . FUO (fever of unknown origin)   . Arterial hypotension   . Tachypnea   . Tachycardia   . Anemia of chronic disease   . Hyperkalemia 11/07/2015  . Nausea without vomiting 11/07/2015  . Pressure ulcer 11/07/2015  . Elevated lactic acid level 11/07/2015  . Sepsis (HCC)   . Acute encephalopathy 11/06/2015  . Hyponatremia 11/06/2015  . Bilateral pneumonia   . Protein-calorie malnutrition, severe (HCC) 10/28/2015  . CAP (community acquired pneumonia) 10/27/2015  . Human immunodeficiency virus (HIV) disease (HCC)   . Acute hypoxemic respiratory failure (HCC) 10/25/2015  . Hypokalemia   . 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  Anemia 10/24/2015  . Weight loss     Palliative Care Assessment & Plan   Patient Profile: 55 y.o. female  with past medical history of anemia, 50+ lb weight loss and recently diagnosed AIDS infection.  She was admitted on 11/25/2015 for rehabilitation after being discharged from a hospital stay for hyponatremia, fever of unknown origin (PCP infection vs IRIS), severe protein calorie malnutrition and severe dysphagia secondary to ulcerations from possible CMV vs HSV esophagitis.  Albumin of 1.3.  She currently has poor functional status and is too weak to walk.  She is refusing a PEG, but is attempting to consume protein drinks.  Assessment: Extremely weak 55 yo female with AIDS who started ART therapy and dropped both her viral load and her CD4 count dramatically (IRIS?).  Also diagnosed with ulcerative esophagitis.  Path indicates possible CMV.    Recommendations/Plan:  Full Scope Treatment  Will work with family on HCPOA and Living Will early next week.  PMT will continue to follow intermittently and maintain a relationship with [name]  Out Patient Palliative Follow up is recommended at discharge.    Code Status:  Full Code  Prognosis:   Unable to determine.  While she is extremely weak and frail - it is anticipated that this is temporary and she has a good chance to recover.  Discharge Planning:  To Be Determined.  Palliative follow up recommended at discharge for continued support.  Care plan was discussed with Dr. Daiva EvesVan Dam, patient  Thank you for allowing the Palliative Medicine Team to assist in the care of this patient.   Time In: 2:00 Time Out: 2:15 Total Time 15 Prolonged Time Billed no      Greater than 50%  of this time was spent counseling and coordinating care related to the above assessment and plan.  Algis DownsMarianne York, PA-C Palliative Medicine Pager: [phone]  Please contact Palliative Medicine Team phone at [phone] for questions and concerns.

## 2015-12-01 NOTE — Progress Notes (Signed)
[facility]     PROGRESS NOTE  Subjective/Complaints:  Pt laying in bed.  She is fatigued, but states she slept fairly.  ROS: +Fatigued. Denies CP, SOB, N/V/D.  Objective: Vital Signs: Blood pressure 121/69, pulse 72, temperature 97.1 F (36.2 C), temperature source Oral, resp. rate 18, height  (1.626 m), weight 63 kg (138 lb 14.6 oz), SpO2 100 %. No results found.  Recent Labs  11/30/15 0641 12/01/15 0500  WBC 4.3 5.3  HGB 9.8* 9.5*  HCT 30.4* 29.8*  PLT 193 215    Recent Labs  11/28/15 1027 12/01/15 0500  NA 134* 131*  K 3.7 4.8  CL 107 102  GLUCOSE 97 82  BUN 8 11  CREATININE 0.75 0.79  CALCIUM 7.3* 7.9*   CBG (last 3)  No results for input(s): GLUCAP in the last 72 hours.  Wt Readings from Last 3 Encounters:  11/28/15 63 kg (138 lb 14.6 oz)  11/23/15 60.6 kg (133 lb 11.2 oz)  10/31/15 58.6 kg (129 lb 3 oz)    Physical Exam:  BP 121/69 (BP Location: Right Arm)   Pulse 72   Temp 97.1 F (36.2 C) (Oral)   Resp 18   Ht  (1.626 m)   Wt 63 kg (138 lb 14.6 oz)   SpO2 100%   BMI 23.84 kg/m  Constitutional: Vital signs are normal. NAD. She appears cachectic. She has a sickly appearance.  HENT: Normocephalic and atraumatic.  Mouth/Throat: Oral lesions present.  Eyes: Conjunctivae and EOM are normal.  Cardiovascular: Regular rate and rhythm. No murmur heard. Respiratory: +Startup. Effort normal and breath sounds normal. No stridor. No respiratory distress. She has no wheezes.  GI: Soft. Bowel sounds are normal. She exhibits no distension. There is no tenderness.  Musculoskeletal: She exhibits no edema or tenderness.  Muscle wasting all limbs.  Neurological: She appears listless.  Motor:  B/l UE: 3/5 deltoid, biceps, triceps, 3+ wrist and HI.  B/l LE: 2+/5 HF, 2+ KE/KF and 3+/5 ADF/PF (poor tolerance).  Skin: Skin is warm and dry. No rash noted. No erythema.  6 small stage 2 ulcers sacrum area. Psychiatric: Her  affect is blunt. She is slowed and withdrawn. Cognition and memory are impaired.   Assessment/Plan: 1. 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 Physiatrist is providing close team supervision and 24 hour management of active medical problems listed below. Physiatrist and rehab team continue to assess barriers to discharge/monitor patient progress toward functional and medical goals.  Function:  Bathing Bathing position   Position: Wheelchair/chair at sink  Bathing parts Body parts bathed by patient: Right arm, Left arm, Chest Body parts bathed by helper: Abdomen, Front perineal area, Buttocks, Right upper leg, Left upper leg, Right lower leg, Left lower leg  Bathing assist Assist Level: 2 helpers      Upper Body Dressing/Undressing Upper body dressing   What is the patient wearing?: Button up shirt         Button up shirt - Perfomed by patient: Thread/unthread left sleeve Button up shirt - Perfomed by helper: Thread/unthread right sleeve, Pull shirt around back, Button/unbutton shirt    Upper body assist Assist Level: More than reasonable time, Set up (MAX A)   Set up : To obtain clothing/put away  Lower Body Dressing/Undressing Lower body dressing   What is the patient wearing?: Non-skid slipper socks, Pants   Underwear -  Performed by helper: Thread/unthread right underwear leg, Thread/unthread left underwear leg, Pull underwear up/down   Pants- Performed by helper: Thread/unthread right pants leg, Thread/unthread left pants leg, Pull pants up/down   Non-skid slipper socks- Performed by helper: Don/doff right sock, Don/doff left sock                  Lower body assist Assist for lower body dressing: 2 Helpers      Toileting Toileting Toileting activity did not occur: No continent bowel/bladder event        Toileting assist      Transfers Chair/bed transfer Chair/bed transfer activity did not occur: Safety/medical concerns Chair/bed transfer method: Other Chair/bed transfer assist level: Maximal assist (Pt 25 - 49%/lift and lower) Chair/bed transfer assistive device: Mechanical lift Mechanical lift: Stedy   Locomotion Ambulation Ambulation activity did not occur: Safety/medical concerns         Wheelchair   Type: Manual Max wheelchair distance: 150 Assist Level: Dependent (Pt equals 0%)  Cognition Comprehension Comprehension assist level: Understands basic 50 - 74% of the time/ requires cueing 25 - 49% of the time  Expression Expression assist level: Expresses basic 25 - 49% of the time/requires cueing 50 - 75% of the time. Uses single words/gestures.  Social Interaction Social Interaction assist level: Interacts appropriately 50 - 74% of the time - May be physically or verbally inappropriate.  Problem Solving Problem solving assist level: Solves basic less than 25% of the time - needs direction nearly all the time or does not effectively solve problems and may need a restraint for safety  Memory Memory assist level: Recognizes or recalls 25 - 49% of the time/requires cueing 50 - 75% of the time    Medical Problem List and Plan: 1.  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  DVT Prophylaxis/Anticoagulation: Mechanical: Sequential compression devices, below knee Bilateral lower extremities 3. Pain Management: Tylenol prn 4. Mood: LCSW to follow for evaluation and support.  5. Neuropsych: This patient is not fully capable of making decisions on her own behalf. 6. Skin/Wound Care: Ordered air mattress overlay due to malnutrition, poor intake   Stage 2 sacral ulcer, pressure relief, protein supplementation 7. Fluids/Electrolytes/Nutrition:  Continue dextrose for now.   Soft diet at present, will advance as tolerated 8. HIV/AIDS with MAC?:  On descovy,  ticovey  Biaxin and ethambutol d/ced per ID on 8/9.   Follow up per ID. Appreciate recs 9. Candida/CMV esophagitis: Diflucan and On Ganciclovir since 08/03 10. FTT: Continue IVF for now. Started calorie count .   11. Potential for pancytopenia:  Check labs periodically given medications 12. Hyponatremia/Hypokalemia: Monitor lytes with Mg and Phos levels every 3-4 days.   Na+ 131 on 8/10: Will cont to monitor, treat if necessary  K+ 4.8 on 8/10: Repleted  Cont to monitor 13. PCP prophylaxis: Bactrim discontinued due to hyponatremia. Has been treated for PCP infection and dapsone resumed 8/4  for prophylaxis.  14. Hypoalbuminemia  Supplement started 8/6 15. Leukocytosis: resolved, secondary to medication 16. Anemia of chronic disease  Hb 9.5 on 8/10  Cont to monitor  LOS (Days) 6 A FACE TO FACE EVALUATION WAS PERFORMED  Aylee Littrell Karis Jubanil Kerrin Markman 12/01/2015 9:01 AM

## 2015-12-01 NOTE — Progress Notes (Addendum)
Occupational Therapy Weekly and Daily Progress Note  Patient Details  Name: Samantha Kline MRN: [id number] Date of Birth: 31-May-1960  Beginning of progress report period: November 26, 2015 End of progress report period: December 01, 2015  Today's Date: 12/01/2015 OT Individual Time: 0803-0900 OT Individual Time Calculation (min): 57 min   Patient has met 0 of 4 short term goals. Pt is making slow progress towards her goals 2/2 medical status limitations and low activity tolerance. She currently completes sit to stand for LB selfcare tasks with Max A and Stedy. Transfers to tilt-in-space w/c have been Max A with the Stedy, but hopeful to progress pt to stand-pivot transfers this week.  Recommend continued UE/LE strengthening, ADL retraining, therapeutic activities, therapeutic exercise,and neuromuscular re-education to increase overall independence with basic selfcare tasks.  Patient continues to demonstrate the following deficits:  muscle weakness, decreased cardiorespiratoy endurance, decreased coordination, decreased awareness, decreased problem solving and decreased memory and decreased sitting balance, decreased standing balance, decreased postural control and decreased balance strategies and therefore will continue to benefit from skilled OT intervention to enhance overall performance with BADL.  Patient progressing toward long term goals..  Continue plan of care.  OT Short Term Goals  Week 1:  OT Short Term Goal 1 (Week 1): Pt will complete functional toilet transfer with Max A OT Short Term Goal 1 - Progress (Week 1): Progressing toward goal OT Short Term Goal 2 (Week 1): Pt will complete UB bathing with Min A OT Short Term Goal 2 - Progress (Week 1): Progressing toward goal OT Short Term Goal 3 (Week 1): Pt will complete sit to stand for LB bathing with Max A OT Short Term Goal 3 - Progress (Week 1): Progressing toward goal OT Short Term Goal 4 (Week 1): Pt will engage in 5 minutes of  therapeutic activity without rest to increase activity tolerance for self care completion  OT Short Term Goal 4 - Progress (Week 1): Progressing toward goal Week 2:  OT Short Term Goal 1 (Week 2): Pt will complete functional toilet transfer with Max A OT Short Term Goal 2 (Week 2): Pt will complete UB bathing with Min A OT Short Term Goal 3 (Week 2): Pt will complete sit to stand for LB bathing with Max A OT Short Term Goal 4 (Week 2): Pt will engage in 5 minutes of therapeutic activity without rest to increase activity tolerance for self-care tasks  Skilled Therapeutic Interventions/Progress Updates:   OT treatment session focused on self-care, feeding, and bed mobility today. Pt w/ large amount of residual food in mouth, unable to manage large bolus requiring suction to remove form mouth. Encouraged pt to take small bites and swallow prior to taking second bite. Pt demonstrated increased hand strength and coordination to open pudding lid and self-feed with verbal cues for size of bite and swallow technique. Pt with poor frustration tolerance, w/ increased frustration per OT directions. RN entered room and OT assisted with bed mobility and positioning for wound care and dressing change on bottom. Pt required Max A for LB dressing, but demonstrated increased strength to bridge bottom off of bed to allow OT to pull pants above hips. Pt left in side-lying position for pressure relief of sore bottom.   Therapy Documentation Precautions:  Precautions Precautions: Fall Restrictions Weight Bearing Restrictions: No  Pain: Pain Assessment Pain Score: 6  Pain Location: Buttocks Pain Onset: With Activity Pain Intervention(s): Repositioned  See Function Navigator for Current Functional Status.   Therapy/Group: Individual Therapy  Daneen Schick Amauri Medellin 12/01/2015, 12:45 PM

## 2015-12-01 NOTE — Progress Notes (Signed)
Pharmacy Antibiotic Note Samantha BumpersHythia Manson PasseyBrown is a 55 y.o. female admitted on 11/25/2015 with CMV.  Pt has been transferred to Rehab and Pharmacy has been consulted for continued Ganciclovir dosing. Renal function stable, scr 0.79, est. crcl ~ 65-70 ml/min. hgb 9.5, pltc 215K, WBC 5.3 K  Plan: Continue Ganciclovir 150 mg (2.5mg /kg) IV q12 Monitor CBC  Height: 5\' 4"  (162.6 cm) Weight: 138 lb 14.6 oz (63 kg) IBW/kg (Calculated) : 54.7  Temp (24hrs), Avg:97.4 F (36.3 C), Min:97.1 F (36.2 C), Max:97.6 F (36.4 C)   Recent Labs Lab 11/26/15 1324 11/28/15 1027 11/29/15 0705 11/30/15 0641 12/01/15 0500  WBC 14.4*  --  4.1 4.3 5.3  CREATININE 0.78 0.75  --   --  0.79    Estimated Creatinine Clearance: 67.8 mL/min (by C-G formula based on SCr of 0.8 mg/dL).    Allergies  Allergen Reactions  . Septra [Sulfamethoxazole-Trimethoprim]     Possible cause of hyponatremia     Thank you for allowing pharmacy to be a part of this patient's care.  Rayme Bui, Tsz-Yin 12/01/2015 8:39 AM

## 2015-12-01 NOTE — Progress Notes (Signed)
Nutrition Follow-up  DOCUMENTATION CODES:   Non-severe (moderate) malnutrition in context of acute illness/injury  INTERVENTION:  Continue Ensure Enlive po 4 times daily, each supplement provides 350 kcal and 20 grams of protein.  Provide Magic cup between meals, each supplement provides 290 kcal and 9 grams of protein.  Provide nourishment snacks between meals (Ordered).  Continue Beneprotein (1 scoop) TID at meals, each scoop provides 25 kcal and 6 grams of protein.   Continue 30 ml Prostat po BID, each supplement provides 100 kcal and 15 grams of protein.   Encourage adequate PO intake.   NUTRITION DIAGNOSIS:   Inadequate oral intake related to lethargy/confusion, poor appetite as evidenced by meal completion < 25%; ongoing  GOAL:   Patient will meet greater than or equal to 90% of their needs; progressing  MONITOR:   PO intake, Supplement acceptance, Diet advancement, Labs, Weight trends, Skin, I & O's  REASON FOR ASSESSMENT:   Consult Calorie Count  ASSESSMENT:   55 y/o female who had originally presented on 7/3 with 50 lb weight loss over few months, anorexia, SOB, progressive weakness. Worked up for PCP PNA and diagnosed with AIDS. Went home on antiviral therapy but continued with n/v, poor intake and inability to walk. Readmitted on 7/17 with lethargy, confusion and weakness and severe hyponatremia (110). Developed candida esophagitis. Progressed, but intake has remained poor with refusal of PEG/NGT. Transferred to inpatient rehab on 8/4. RD consulted for calorie count/poor PO intake  Meal completion has been 0-20%. Pt reports appetite has been improving just a little bit. Pt does report not liking her food at meals due to lack of taste/flavor. Pt has been however consuming most of her supplements. RD to continue with current orders. Pt encouraged to eat her food at meals and to drink her supplements.   Labs and medications reviewed.   Diet Order:  DIET - DYS 1  Room service appropriate? Yes; Fluid consistency: Thin  Skin:  Wound (see comment) (MASD on peri-rectal area)  Last BM:  8/8  Height:   Ht Readings from Last 1 Encounters:  11/25/15 5\' 4"  (1.626 m)    Weight:   Wt Readings from Last 1 Encounters:  11/28/15 138 lb 14.6 oz (63 kg)    Ideal Body Weight:  54.54 kg  BMI:  Body mass index is 23.84 kg/m.  Estimated Nutritional Needs:   Kcal:  1700-1900 (28-31 kcal/kg bw)  Protein:  84-96 g (1.4-1.6 g/kg bw)  Fluid:  >1.8 liters (30 ml/kg bw)  EDUCATION NEEDS:   No education needs identified at this time  Roslyn SmilingStephanie Jessejames Steelman, MS, RD, LDN Pager # [phone] After hours/ weekend pager # [phone]

## 2015-12-01 NOTE — Progress Notes (Signed)
Physical Therapy Session Note  Patient Details  Name: Lorna FewHythia Zaldivar MRN: [id number] Date of Birth: 1960/08/31  Today's Date: 12/01/2015 PT Individual Time: 1000- 1100   Calculated individual PT time: 60 min    Short Term Goals: Week 1:  PT Short Term Goal 1 (Week 1): Pt will maintain static and dynamic sitting balance in midline with mod A PT Short Term Goal 2 (Week 1): Pt will perform bed mobility with max A with pt initiating 25% of transfer sequence PT Short Term Goal 3 (Week 1): Pt will perform bed <> w/c transfers with consistent max A of one person and initiating 25% of transfer PT Short Term Goal 4 (Week 1): Pt will tolerate 1-2 hours up in reclining w/c to improve core strength and positioning for meals PT Short Term Goal 5 (Week 1): Pt will initiate pre-gait activities in standing with max A  Skilled Therapeutic Interventions/Progress Updates:    Patient received sidelying in bed and agreeable to PT. Patient noted to be more alert and willing to participate in therpay compared at previous sessions.  PT instructed patient in Bed mobility with mod-max Assist for sidelying to sitting EOB. Mod cues for sequencing as well as UE and LE positioning to push from bed rails provided by PT. Once sitting EOB, patient was able to maintain sitting position for 5 minutes with intermitent 1-2 UE support and mod cues from PT for proper UE placement. Patient instructed on lateral scoot with max A +2 for improved pelvic alignment.  Patient took sidelying rest break after 5 minutes of sitting tolerance including  reaching in sitting to L and R as well as forward outside BOS. Patient noted to have increased postural control and able to return to midline with only min A from PT and 1 UE support.    [facility] PT fitted patient for Roho seat cushion for improved pressure relief in presence for decubitus ulcer on gluteal region.    Patient [facility]  [facility]. PT adjusted tilt in space head support for improved support at occiput and reduced forward head posture in sitting. Patient able to demonstrate trunk control for forwrad lean for up to 5 minutes while PT adjusted WC.  Sit<>stand with stedy x 2 with max A +2 for safety. Mod cues for proper UE and LE positioning. Patient able to position B LE correctly with only supervision on this day.    PT instructed patient in stansing tolerance with min-mod A from PT up to 1 min x2 with forward reach x 2 BUE on second bout. Patient noted to perform weight shift L and R as well as adjust feet in stance while in stedy.   Patient returned to room and [facility] in reach.    Therapy Documentation Precautions:  Precautions Precautions: Fall Restrictions Weight Bearing Restrictions: No General:   Vital Signs: Therapy Vitals Temp: 97.1 F (36.2 C) Temp Source: Oral Pulse Rate: 72 Resp: 18 BP: 121/69 Patient Position (if appropriate): Lying Oxygen Therapy SpO2: 100 % O2 Device: Not Delivered Pain:    0/10   See Function Navigator for Current Functional Status.   Therapy/Group: Individual Therapy  Golden Popustin E Laniece Hornbaker 12/01/2015, 7:58 AM

## 2015-12-02 ENCOUNTER — Inpatient Hospital Stay (HOSPITAL_COMMUNITY): Payer: Managed Care, Other (non HMO) | Admitting: Speech Pathology

## 2015-12-02 ENCOUNTER — Inpatient Hospital Stay (HOSPITAL_COMMUNITY): Payer: Managed Care, Other (non HMO) | Admitting: Occupational Therapy

## 2015-12-02 ENCOUNTER — Inpatient Hospital Stay (HOSPITAL_COMMUNITY): Payer: Managed Care, Other (non HMO) | Admitting: Physical Therapy

## 2015-12-02 NOTE — Progress Notes (Signed)
Occupational Therapy Session Note  Patient Details  Name: Samantha Kline MRN: [id number] Date of Birth: [birth year]  Today's Date: 12/02/2015 OT Individual Time: 1004-1102 OT Individual Time Calculation (min): 58 min     Short Term Goals: Week 2:  OT Short Term Goal 1 (Week 2): Pt will complete functional toilet transfer with Max A OT Short Term Goal 2 (Week 2): Pt will complete UB bathing with Min A OT Short Term Goal 3 (Week 2): Pt will complete sit to stand for LB bathing with Max A OT Short Term Goal 4 (Week 2): Pt will engage in 5 minutes of therapeutic activity without rest to increase activity tolerance for self-care tasks  Skilled Therapeutic Interventions/Progress Updates:   OT treatment session focused on increased activity tolerance and participation, functional shower transfers, and LB/UB dressing and bathing. Pt greeted in w/c, much brighter and interactive today. Pt required Min A to stand, but Mod-Max A for stand-pivot transfers w/ max verbal and tactile cues to move LE's.  Pt able to wash her chest and both upper thighs, but then became internally distracted by her pain, and could not participate further in bathing tasks. Pt w/ completely forward flexed posture in shower chair, attempting to protect her painful bottom sores, despite OT's attempt to reposition to relief pressure. Pt with poor frustration tolerance, often not understanding the goal for activities despite explanation. Pt transferred back to bed with same transfer assistance and OT donned TED hose and socks for pt. RN to enter room to change dressing on buttocks.  Therapy Documentation Precautions:  Precautions Precautions: Fall Restrictions Weight Bearing Restrictions: No Pain: Pain Assessment Pain Assessment: Faces Pain Score: 7  Faces Pain Scale: Hurts even more Pain Type: Acute pain Pain Location: Buttocks Pain Orientation: Mid Pain Descriptors / Indicators: Sore Pain Intervention(s): Repositioned See  Function Navigator for Current Functional Status.   Therapy/Group: Individual Therapy  Mal Amabilelisabeth S Imagene Boss 12/02/2015, 2:48 PM

## 2015-12-02 NOTE — Progress Notes (Signed)
Subjective:  Patient's appetite is a little bit better  Antibiotics:  Anti-infectives    Start     Dose/Rate Route Frequency Ordered Stop   11/29/15 1545  fluconazole (DIFLUCAN) tablet 200 mg     200 mg Oral Daily 11/29/15 1532     11/26/15 1600  fluconazole (DIFLUCAN) IVPB 400 mg  Status:  Discontinued     400 mg 100 mL/hr over 120 Minutes Intravenous Every 24 hours 11/25/15 1726 11/25/15 1819   11/26/15 1200  dolutegravir (TIVICAY) tablet 50 mg     50 mg Oral Daily 11/25/15 1726     11/26/15 1200  emtricitabine-tenofovir AF (DESCOVY) 200-25 MG per tablet 1 tablet     1 tablet Oral Daily 11/25/15 1726     11/26/15 0800  dapsone tablet 100 mg     100 mg Oral Daily 11/25/15 1726     11/26/15 0800  ethambutol (MYAMBUTOL) tablet 900 mg  Status:  Discontinued    Comments:  Adjust dose as needed   900 mg Oral Daily 11/25/15 1726 11/30/15 1731   11/25/15 2000  clarithromycin (BIAXIN) tablet 500 mg  Status:  Discontinued    Comments:  Can use suspension if she has trouble swallowing   500 mg Oral Every 12 hours 11/25/15 1726 11/30/15 1731   11/25/15 1800  ganciclovir (CYTOVENE) 150 mg in sodium chloride 0.9 % 100 mL IVPB     2.5 mg/kg  60.6 kg 100 mL/hr over 60 Minutes Intravenous Every 12 hours 11/25/15 1726        Medications: Scheduled Meds: . amitriptyline  10 mg Oral QHS  . antiseptic oral rinse  7 mL Mouth Rinse BID  . dapsone  100 mg Oral Daily  . dimethicone   Topical TID PC  . dolutegravir  50 mg Oral Q1200  . dronabinol  5 mg Oral BID AC  . emtricitabine-tenofovir AF  1 tablet Oral Q1200  . feeding supplement (ENSURE ENLIVE)  237 mL Oral QID  . feeding supplement (PRO-STAT SUGAR FREE 64)  30 mL Oral Q1500  . ferrous sulfate  325 mg Oral Q breakfast  . fluconazole  200 mg Oral Daily  . ganciclovir (CYTOVENE) IV  2.5 mg/kg Intravenous Q12H  . Gerhardt's butt cream   Topical BID  . metoprolol tartrate  12.5 mg Oral BID  . multivitamin with minerals  1  tablet Oral Daily  . potassium chloride  20 mEq Oral BID  . protein supplement  1 scoop Oral TID WC  . vitamin B-12  100 mcg Oral Daily  . Vitamin D (Ergocalciferol)  50,000 Units Oral Q Fri   Continuous Infusions:   PRN Meds:.acetaminophen, albuterol, alum & mag hydroxide-simeth, bisacodyl, dextromethorphan-guaiFENesin, diphenhydrAMINE, ondansetron, prochlorperazine **OR** prochlorperazine **OR** prochlorperazine, senna-docusate, sodium phosphate, traZODone    Objective: Weight change:   Intake/Output Summary (Last 24 hours) at 12/02/15 1925 Last data filed at 12/02/15 1700  Gross per 24 hour  Intake              547 ml  Output             1545 ml  Net             -998 ml   Blood pressure 105/64, pulse 85, temperature 97.2 F (36.2 C), temperature source Oral, resp. rate 16, height 5\' 4"  (1.626 m), weight 125 lb 9.6 oz (57 kg), SpO2 97 %. Temp:  [97.2 F (36.2 C)-98.4 F (36.9 C)] 97.2  F (36.2 C) (08/11 1507) Pulse Rate:  [85-97] 85 (08/11 1507) Resp:  [16] 16 (08/11 1507) BP: (103-113)/(64-74) 105/64 (08/11 1507) SpO2:  [97 %-99 %] 97 % (08/11 1507) Weight:  [125 lb 9.6 oz (57 kg)] 125 lb 9.6 oz (57 kg) (08/11 1507)  Physical Exam: General: weak but awake and oriented to person place HEENT: anicteric sclera, pupils reactive to light and accommodation, EOMI she appears to have thrush on her tongue CVS tachycardic rate, normal r,  no murmur rubs or gallops Chest: clear to auscultation bilaterally, no wheezing, rales or rhonchi Abdomen: soft not especially tender, nondistended, normal bowel sounds, Extremities: no  clubbing or edema noted bilaterally Skin: no rashes Neuro: nonfocal  CBC: CBC Latest Ref Rng & Units 12/01/2015 11/30/2015 11/29/2015  WBC 4.0 - 10.5 K/uL 5.3 4.3 4.1  Hemoglobin 12.0 - 15.0 g/dL 1.6(X) 0.9(U) 0.4(V)  Hematocrit 36.0 - 46.0 % 29.8(L) 30.4(L) 26.0(L)  Platelets 150 - 400 K/uL 215 193 183      BMET  Recent Labs  12/01/15 0500  NA 131*    K 4.8  CL 102  CO2 26  GLUCOSE 82  BUN 11  CREATININE 0.79  CALCIUM 7.9*     Liver Panel   Recent Labs  12/01/15 0500  PROT 5.7*  ALBUMIN 1.3*  AST 26  ALT 14  ALKPHOS 94  BILITOT 0.6       Sedimentation Rate No results for input(s): ESRSEDRATE in the last 72 hours. C-Reactive Protein No results for input(s): CRP in the last 72 hours.  Micro Results: Recent Results (from the past 720 hour(s))  Culture, blood (Routine x 2)     Status: None   Collection Time: 11/06/15  8:30 PM  Result Value Ref Range Status   Specimen Description BLOOD LEFT ARM  Final   Special Requests IN PEDIATRIC BOTTLE 2CC  Final   Culture NO GROWTH 5 DAYS  Final   Report Status 11/11/2015 FINAL  Final  Culture, blood (Routine x 2)     Status: None   Collection Time: 11/06/15  8:45 PM  Result Value Ref Range Status   Specimen Description BLOOD LEFT ARM  Final   Special Requests BOTTLES DRAWN AEROBIC AND ANAEROBIC 4CC   Final   Culture NO GROWTH 5 DAYS  Final   Report Status 11/11/2015 FINAL  Final  Urine culture     Status: None   Collection Time: 11/06/15 10:36 PM  Result Value Ref Range Status   Specimen Description URINE, CATHETERIZED  Final   Special Requests NONE  Final   Culture NO GROWTH  Final   Report Status 11/08/2015 FINAL  Final  MRSA PCR Screening     Status: None   Collection Time: 11/09/15 11:54 AM  Result Value Ref Range Status   MRSA by PCR NEGATIVE NEGATIVE Final    Comment:        The GeneXpert MRSA Assay (FDA approved for NASAL specimens only), is one component of a comprehensive MRSA colonization surveillance program. It is not intended to diagnose MRSA infection nor to guide or monitor treatment for MRSA infections.   Culture, blood (routine x 2)     Status: None   Collection Time: 11/11/15  4:50 PM  Result Value Ref Range Status   Specimen Description BLOOD LEFT ANTECUBITAL  Final   Special Requests BOTTLES DRAWN AEROBIC ONLY 5CC  Final   Culture NO  GROWTH 5 DAYS  Final   Report Status 11/16/2015 FINAL  Final  Culture, blood (routine x 2)     Status: None   Collection Time: 11/11/15  4:55 PM  Result Value Ref Range Status   Specimen Description BLOOD BLOOD LEFT ARM  Final   Special Requests IN PEDIATRIC BOTTLE 1CC  Final   Culture NO GROWTH 5 DAYS  Final   Report Status 11/16/2015 FINAL  Final  Acid Fast Smear (AFB)     Status: None   Collection Time: 11/11/15  8:11 PM  Result Value Ref Range Status   AFB Specimen Processing Concentration  Final    Comment: (NOTE) Performed At: [facility] [address] [place], [place] [id number] Mila HomerHancock William F MD WU:[phone]    Acid Fast Smear NOSMR  Final    Comment: (NOTE) The acid-fast bacilli (AFB) smear will not be performed due to the specimen source (blood) or submission of an insufficient quantity of specimen.    Source (AFB) BLOOD  Corrected  Culture, blood (routine x 2)     Status: None   Collection Time: 11/17/15  1:10 AM  Result Value Ref Range Status   Specimen Description BLOOD RIGHT ANTECUBITAL  Final   Special Requests BOTTLES DRAWN AEROBIC AND ANAEROBIC 10CC,5CC  Final   Culture NO GROWTH 5 DAYS  Final   Report Status 11/22/2015 FINAL  Final  Culture, blood (routine x 2)     Status: None   Collection Time: 11/17/15  1:23 AM  Result Value Ref Range Status   Specimen Description BLOOD RIGHT HAND  Final   Special Requests IN PEDIATRIC BOTTLE 3CC  Final   Culture NO GROWTH 5 DAYS  Final   Report Status 11/22/2015 FINAL  Final  Hsv Culture And Typing     Status: None   Collection Time: 11/23/15  2:55 PM  Result Value Ref Range Status   HSV Culture/Type Comment  Final    Comment: (NOTE) Negative No Herpes simplex virus isolated. Performed At: [facility] [address] [place], [place] [id number] Mila HomerHancock William F MD VH:[phone]    Source of Sample ESOPHAGUS  Final  Cytomegalovirus (CMV) Culture     Status: None   Collection Time: 11/23/15   2:55 PM  Result Value Ref Range Status   Cytomegalovirus (CMV) Culture Comment  Final    Comment: (NOTE) Possible virus present; holding culture for further study. Final report to follow. Performed At: [facility] [address] [place], [place] [id number] Mila HomerHancock William F MD UV:[phone]    Source of Sample ESOPHEGUS  Final    Studies/Results: No results found.    Assessment/Plan:  INTERVAL HISTORY:   Dysphagia persists  11/23/15: EGD performed by Dr. Ewing SchleinMagod (greatly appreciate this) and specimen sent to path but I cannot locate any PCR's for CMV or HSV EGD did show ulcers  8/3-->11/29/15 patient started on gancyclovir. CMV not detected on shell vial culture (would have preferred PCRs), trasnferred to Rehab  11/30/15: having trouble getting sufficient nutrition, appetite poor 12/01/15 through 12/02/15: I discontinued her Mycobacterium avium antibiotics    Principal Problem:   Physical debility Active Problems:   Human immunodeficiency virus (HIV) disease (HCC)   Acute encephalopathy   HIV encephalopathy (HCC)   Benign essential HTN   Dysphagia   Antineoplastic chemotherapy induced pancytopenia (HCC)   Drug-induced folate deficiency anemia   Leukocytosis   Stage 2 skin ulcer of sacral region   Mycobacterium avium complex (HCC)   Thrush   Palliative care encounter   Goals of care, counseling/discussion   Anorexia  Samantha Kline is a 55 y.o. female with  Newly dx HIV/AIDS rx for PCP admitted with FTT, esophagitis, fever, started on empiric rx for M avium, fluconazole for possible esophageal candidiasis.   #1 Poor appetite: I am going to DC her ETH and Biaxin as we have never proven M avium infection and the patient may be suffering SE from these meds. 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 She should still have her CBC checked every few days  Course of treatment will be 21 days, currently with IV GCV though we can change to valcyte along the way at treatment doses and then for prophylaxis until she meets requsite immune reconstitution  #3 Possible M avium infection :  Fevers broke with empiric rx, AFB cultures will incubate for 6 weeks from blood Above I'm stopping her therapy since it may be impacting her appetite  #4 HIV/AIDS: she has started Tivicay and Descovy a regimen that should afford her rapid virological suppression while it will give higher risk for IRIS and indeed her VL has dropped from 200K to 200 in only 3 weeks and now at 40    Lab Results  Component Value Date   HIV1RNAQUANT 40 11/30/2015   HIV1RNAQUANT 200 11/16/2015   Lab Results  Component Value Date   CD4TABS 20 (L) 11/16/2015   #5Candida: she has had 2 weeks of very high dose azole and DC it but tongue with evidence of thrush so weill go back to fluconazole  daily    I will sign off for now  Please call us back as she nears discharge so that we can check in and make sure she is on all of the right meds and that she has followup with [place].       LOS: 7 days   Acey Lav 12/02/2015, 7:25 PM

## 2015-12-02 NOTE — Progress Notes (Signed)
[facility] PHYSICAL MEDICINE & REHABILITATION     PROGRESS NOTE  Subjective/Complaints:  Pt seen working with PT.  She is more awake and interactive.  She states she feels, "a whole lot better".  Encouraged to sit up during the weekend as well.   ROS: +Fatigued. Denies CP, SOB, N/V/D.  Objective: Vital Signs: Blood pressure 111/64, pulse 97, temperature 98.4 F (36.9 C), temperature source Oral, resp. rate 16, height 5\' 4"  (1.626 m), weight 63 kg (138 lb 14.6 oz), SpO2 99 %. No results found.  Recent Labs  11/30/15 0641 12/01/15 0500  WBC 4.3 5.3  HGB 9.8* 9.5*  HCT 30.4* 29.8*  PLT 193 215    Recent Labs  12/01/15 0500  NA 131*  K 4.8  CL 102  GLUCOSE 82  BUN 11  CREATININE 0.79  CALCIUM 7.9*   CBG (last 3)  No results for input(s): GLUCAP in the last 72 hours.  Wt Readings from Last 3 Encounters:  11/28/15 63 kg (138 lb 14.6 oz)  11/23/15 60.6 kg (133 lb 11.2 oz)  10/31/15 58.6 kg (129 lb 3 oz)    Physical Exam:  BP 111/64   Pulse 97   Temp 98.4 F (36.9 C) (Oral)   Resp 16   Ht 5\' 4"  (1.626 m)   Wt 63 kg (138 lb 14.6 oz)   SpO2 99%   BMI 23.84 kg/m  Constitutional: Vital signs are normal. NAD. She appears cachectic. She has a sickly appearance.  HENT: Normocephalic and atraumatic.  Mouth/Throat: Oral lesions present.  Eyes: Conjunctivae and EOM are normal.  Cardiovascular: Regular rate and rhythm. No murmur heard. Respiratory: +[place]. Effort normal and breath sounds normal. No stridor. No respiratory distress. She has no wheezes.  GI: Soft. Bowel sounds are normal. She exhibits no distension. There is no tenderness.  Musculoskeletal: She exhibits no edema or tenderness.  Muscle wasting all limbs.  Neurological: She appears listless.  Motor:  B/l UE: 4/5 deltoid, biceps, triceps, 4/5 wrist and HI.  B/l LE: 4-/5 HF, 4/5 KE/KF and 4/5 ADF/PF (poor tolerance - improving).  Skin: Skin is warm and dry. No rash noted. No erythema.  6 small stage 2 ulcers  sacrum area (not examined today). Psychiatric: Her affect is blunt. She is slowed and withdrawn. Cognition and memory are impaired.   Assessment/Plan: 1. 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 Physiatrist is providing close team supervision and 24 hour management of active medical problems listed below. Physiatrist and rehab team continue to assess barriers to discharge/monitor patient progress toward functional and medical goals.  Function:  Bathing Bathing position   Position: Wheelchair/chair at sink  Bathing parts Body parts bathed by patient: Right arm, Left arm, Chest Body parts bathed by helper: Abdomen, Front perineal area, Buttocks, Right upper leg, Left upper leg, Right lower leg, Left lower leg  Bathing assist Assist Level: 2 helpers      Upper Body Dressing/Undressing Upper body dressing   What is the patient wearing?: Button up shirt         Button up shirt - Perfomed by patient: Thread/unthread left sleeve Button up shirt - Perfomed by helper: Thread/unthread right sleeve, Pull shirt around back, Button/unbutton shirt    Upper body assist Assist Level: More than reasonable time, Set up (MAX A)   Set up : To obtain clothing/put away  Lower Body Dressing/Undressing Lower body dressing   What  is the patient wearing?: Ted Hose, Non-skid slipper socks   Underwear - Performed by helper: Thread/unthread right underwear leg, Thread/unthread left underwear leg, Pull underwear up/down   Pants- Performed by helper: Thread/unthread right pants leg, Thread/unthread left pants leg, Pull pants up/down   Non-skid slipper socks- Performed by helper: Don/doff right sock, Don/doff left sock               TED Hose - Performed by helper: Don/doff left TED hose, Don/doff right TED hose  Lower body assist Assist for lower body dressing: 2 Helpers       Financial trader activity did not occur: No continent bowel/bladder event   Toileting steps completed by helper: Adjust clothing prior to toileting, Performs perineal hygiene, Adjust clothing after toileting Toileting Assistive Devices: Grab bar or rail, Other (comment) (Stedy)  Toileting assist Assist level: Touching or steadying assistance (Pt.75%), More than reasonable time, Two helpers   Transfers Chair/bed transfer Chair/bed transfer activity did not occur: Safety/medical concerns Chair/bed transfer method: Other Chair/bed transfer assist level: 2 helpers Chair/bed transfer assistive device: Mechanical lift Mechanical lift: Landscape architect Ambulation activity did not occur: Safety/medical concerns         Wheelchair   Type: Manual Max wheelchair distance: 150 Assist Level: Dependent (Pt equals 0%)  Cognition Comprehension Comprehension assist level: Understands basic 75 - 89% of the time/ requires cueing 10 - 24% of the time  Expression Expression assist level: Expresses basic 50 - 74% of the time/requires cueing 25 - 49% of the time. Needs to repeat parts of sentences.  Social Interaction Social Interaction assist level: Interacts appropriately 50 - 74% of the time - May be physically or verbally inappropriate.  Problem Solving Problem solving assist level: Solves basic 25 - 49% of the time - needs direction more than half the time to initiate, plan or complete simple activities  Memory Memory assist level: Recognizes or recalls 25 - 49% of the time/requires cueing 50 - 75% of the time    Medical Problem List and Plan: 1.  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  DVT Prophylaxis/Anticoagulation: Mechanical: Sequential compression devices, below knee Bilateral lower extremities 3. Pain Management: Tylenol prn 4. Mood: LCSW to follow for evaluation and support.  5. Neuropsych: This patient is  not fully capable of making decisions on her own behalf. 6. Skin/Wound Care: Ordered air mattress overlay due to malnutrition, poor intake   Stage 2 sacral ulcer, pressure relief, protein supplementation 7. Fluids/Electrolytes/Nutrition:  Continue dextrose for now.   Soft diet at present, will advance as tolerated 8. HIV/AIDS with MAC?:  On descovy, ticovey  Biaxin and ethambutol d/ced per ID on 8/9.   Follow up per ID. Appreciate recs 9. Candida/CMV esophagitis: Diflucan and On Ganciclovir since 08/03 10. FTT: Continue IVF for now. Started calorie count .   11. Potential for pancytopenia:  Check labs periodically given medications 12. Hyponatremia/Hypokalemia: Monitor lytes with Mg and Phos levels every 3-4 days.   Na+ 131 on 8/10: Will cont to monitor, treat if necessary  K+ 4.8 on 8/10: Repleted  Cont to monitor 13. PCP prophylaxis: Bactrim discontinued due to hyponatremia. Has been treated for PCP infection and dapsone resumed 8/4  for prophylaxis.  14. Hypoalbuminemia  Supplement started 8/6 15. Leukocytosis: resolved, secondary to medication 16. Anemia of chronic disease  Hb 9.5 on 8/10  Cont to monitor  LOS (Days) 7 A FACE TO FACE EVALUATION  WAS PERFORMED  Ankit Karis Juba 12/02/2015 9:52 AM

## 2015-12-02 NOTE — Progress Notes (Signed)
Physical Therapy Session Note  Patient Details  Name: Samantha Kline MRN: [id number] Date of Birth: 31-Aug-1960  Today's Date: 12/02/2015 PT IndLorna Fewividual Time: 0801-0900 PT Individual Time Calculation (min): 59 min    Short Term Goals: Week 1:  PT Short Term Goal 1 (Week 1): Pt will maintain static and dynamic sitting balance in midline with mod A PT Short Term Goal 2 (Week 1): Pt will perform bed mobility with max A with pt initiating 25% of transfer sequence PT Short Term Goal 3 (Week 1): Pt will perform bed <> w/c transfers with consistent max A of one person and initiating 25% of transfer PT Short Term Goal 4 (Week 1): Pt will tolerate 1-2 hours up in reclining w/c to improve core strength and positioning for meals PT Short Term Goal 5 (Week 1): Pt will initiate pre-gait activities in standing with max A  Skilled Therapeutic Interventions/Progress Updates:     Patient received sitting up in bed and agreeable to PT.  Semirecumbent to sitting EOB with max A from PT with manual faciliation of LE and proper UE positioning to control trunk in sitting. Patient performed sitting talerance on EOB for 5 minutes with slight increase in pain at wound.  Sit<>stand at sted with max A,  and mod A for trunk support [facility] Patient required max a for improved UE positioning to pull up on support bar and proper LE positioning. Patient able to positing BLE with guidance only.   Patient performed sitting tolerance while eating breakfast for 15 minutes with 3 rest breaks. She was able to hold trunk away from back rest with cues from PT for improved posture while easting for up to 5 minutes at a time. PT was required to encourage small bite, small sips and to to minimize pocketing of eggs. Patient noted to prefer ensure, over pureed food.   Patient transferred to rehab gym and performed standing tolerance at parallel bars x 2 minutes with reaching to target within BOS x 2 BUE. Patient also performed  reciprocal marching in standing position x 2 BLE.   Max A to to returned to sitting position with cues for support from UE.   Patient returned to [facility] and [facility] with [facility] bell in reach.   PT attempted to see patient for second session, but patient reported that she was too tired for any therapy. PT educated patient on importance of continued activity for long term endurance. Patient declined therapy and said that she would try tomorrow.    Therapy Documentation Precautions:  Precautions Precautions: Fall Restrictions Weight Bearing Restrictions: No General:   missed minutes: 30. ( patient refusal)  Vital Signs: Therapy Vitals Temp: 97.2 F (36.2 C) Temp Source: Oral Pulse Rate: 85 Resp: 16 BP: 105/64 Patient Position (if appropriate): Lying Oxygen Therapy SpO2: 97 % O2 Device: Not Delivered Pain: Pain Assessment Pain Assessment: Faces Pain Score: 7  Faces Pain Scale: Hurts even more Pain Type: Acute pain Pain Location: Buttocks Pain Orientation: Mid Pain Descriptors / Indicators: Sore Pain Intervention(s): Repositioned   See Function Navigator for Current Functional Status.   Therapy/Group: Individual Therapy  Golden Popustin E Julietta Batterman 12/02/2015, 3:13 PM

## 2015-12-02 NOTE — Progress Notes (Signed)
Speech Language Pathology Weekly Progress and Session Note  Patient Details  Name: Samantha Kline MRN: [id number] Date of Birth: [birth year]  Beginning of progress report period:  November 25, 2015  End of progress report period: December 02, 2015  Today's Date: 12/02/2015 SLP Individual Time: 3557-3220 SLP Individual Time Calculation (min): 41 min   Short Term Goals:Week 1: SLP Short Term Goal 1 (Week 1): Patient will initiate self-care tasks in under 30 seconds with no more than 2 verbal cues  SLP Short Term Goal 1 - Progress (Week 1): Met SLP Short Term Goal 2 (Week 1): Patient will initiate verbal expression of basic wants and needs with Mod question cues  SLP Short Term Goal 2 - Progress (Week 1): Met SLP Short Term Goal 3 (Week 1): Patient will problem solve use of call bell with Mod assist verbal and visual cues  SLP Short Term Goal 3 - Progress (Week 1): Met SLP Short Term Goal 4 (Week 1): Patient will utilize external aids to recall daily information with Mod assist cues SLP Short Term Goal 4 - Progress (Week 1): Met SLP Short Term Goal 5 (Week 1): Patient will demonstrate sustained attention to familiar tasks for 5 minutes with Mod verbal cues for redirection  SLP Short Term Goal 5 - Progress (Week 1): Progressing toward goal SLP Short Term Goal 6 (Week 1): Patient will demonstrate effective mastication and oral clearance of soft solid textures with Mod verbal cues SLP Short Term Goal 6 - Progress (Week 1): Discontinued (comment) (changed diet to full liquids for comfort due to esophagitis)    New Short Term Goals: Week 2: SLP Short Term Goal 1 (Week 2): Patient will initiate verbal expression of basic wants and needs with Min question cues  SLP Short Term Goal 2 (Week 2): Patient will problem solve use of call bell with Min assist verbal and visual cues  SLP Short Term Goal 3 (Week 2): Patient will initiate self-care tasks in under 15 seconds with no more than 2 verbal cues  SLP  Short Term Goal 4 (Week 2): Patient will utilize external aids to recall daily information with Min assist cues SLP Short Term Goal 5 (Week 2): Patient will demonstrate sustained attention to familiar tasks for 5 minutes with Mod verbal cues for redirection  SLP Short Term Goal 6 (Week 2): Pt will consume 25% of full liquids diet with mod encouragement  Weekly Progress Updates: Pt has made slow but functional gains this reporting period and has met 4 out of 6 short term goals.  Pt currently requires mod-max assist for very basic tasks due to moderately severe cognitive impairment but has demonstrated improved in verbal initiation of requests, task initiation, and sustained attention to tasks.  Pt's diet has been modified to full liquids at this time for comfort and to hopefully promote increased PO intake.  Pt and family education is ongoing.  Pt would continue to benefit from skilled ST while inpatient in order to maximize functional independence and reduce burden of care prior to discharge.       Intensity: Minumum of 1-2 x/day, 30 to 90 minutes Frequency: 3 to 5 out of 7 days Duration/Length of Stay: 12-16 days Treatment/Interventions: Cognitive remediation/compensation;Cueing hierarchy;Functional tasks;Environmental controls;Dysphagia/aspiration precaution training;Internal/external aids;Speech/Language facilitation;Therapeutic Activities   Daily Session  Skilled Therapeutic Interventions: Pt was seen for skilled ST targeting goals for dysphagia and cognition.  Therapist noted that pt's diet had been downgraded to dys 1 textures and thin liquids.  Per Gus Height.,  pt had been downgraded for comfort reasons secondary to esophagitis.  Therapist recommended that diet be changed to full liquids instead for comfort given that pt is consuming nutritional shakes but no real solid foods at this time.  P.A. In agreement.  Pt drank ~10% of an Ensure beverage with min encouragement and no overt s/s of aspiration.   Pt able to initiate request for specific flavor of Ensure with mod assist verbal cues.   Pt also again appeared much clearer today in comparison to previous therapy sessions and was able to sustain her attention to functional conversations with therapist for ~3 minutes with supervision cues for redirection.  Pt was able to recall at least 1 detail from today's therapy session with mod question cues but needed max assist verbal cues to recall that she had a catheter in place.  Pt able to utilize call bell with mod assist to request pain meds from RN.   Pt was left in bed with bed alarm set and call bell within reach.  Continue per current plan of care.     Function:   Eating Eating   Modified Consistency Diet: Yes Eating Assist Level: Supervision or verbal cues           Cognition Comprehension Comprehension assist level: Understands basic 75 - 89% of the time/ requires cueing 10 - 24% of the time  Expression   Expression assist level: Expresses basic 50 - 74% of the time/requires cueing 25 - 49% of the time. Needs to repeat parts of sentences.  Social Interaction Social Interaction assist level: Interacts appropriately 50 - 74% of the time - May be physically or verbally inappropriate.  Problem Solving Problem solving assist level: Solves basic 25 - 49% of the time - needs direction more than half the time to initiate, plan or complete simple activities  Memory Memory assist level: Recognizes or recalls 25 - 49% of the time/requires cueing 50 - 75% of the time   General    Pain Pain Assessment Pain Assessment: Faces Faces Pain Scale: Hurts even more Pain Type: Acute pain Pain Location: Buttocks Pain Descriptors / Indicators: Aching Pain Intervention(s): Repositioned;RN made aware  Therapy/Group: Individual Therapy  Axell Trigueros, Selinda Orion 12/02/2015, 1:48 PM

## 2015-12-03 ENCOUNTER — Inpatient Hospital Stay (HOSPITAL_COMMUNITY): Payer: Managed Care, Other (non HMO) | Admitting: Physical Therapy

## 2015-12-03 NOTE — Progress Notes (Signed)
Physical Therapy Session Note  Patient Details  Name: Samantha Kline MRN: [id number] Date of Birth: August 16, 1960  Today's Date: 12/03/2015 PT Individual Time:1615-1655 Calculated PT individual time: 40 min       Short Term Goals: Week 1:  PT Short Term Goal 1 (Week 1): Pt will maintain static and dynamic sitting balance in midline with mod A PT Short Term Goal 2 (Week 1): Pt will perform bed mobility with max A with pt initiating 25% of transfer sequence PT Short Term Goal 3 (Week 1): Pt will perform bed <> w/c transfers with consistent max A of one person and initiating 25% of transfer PT Short Term Goal 4 (Week 1): Pt will tolerate 1-2 hours up in reclining w/c to improve core strength and positioning for meals PT Short Term Goal 5 (Week 1): Pt will initiate pre-gait activities in standing with max A  Skilled Therapeutic Interventions/Progress Updates:     Patient received semirecumbent in bed and agreeable to PT , but states that she is very tired today.  PT instructed patient to sitting EOB with max-total A and max cues for LE and UE positioning to improve independence with transfer. Patient unable to proper place BUE in position to assist with transfer. PT instructed patient in sitting tolerance at EOB for 10 minute with max A on this day. Patient unable to hold trunk in neutral position without assist from PT and noted to have posterior and R lean throughout sitting. PT assisted patient with sit<>supine through sidelying with max A on 2 attempts. Pt was unable to properly position L UE for sidlying on first attempt and required PT manually reposition UE.  PT instructed patient in bed mobility training with roll L and R x 2 each with heavy use of bed features and min-mod A for improved use of LE and proper technique.   Supine therex.  SAQ x 8 BLE Heel slides x 5 BLE.  Clam shells against manual resistance x 6 BLE.  PT provided mod cues for increased ROM as tolerated by PT.   Patient  left semi recumbent in bed after assisting PT with scoot to [facility] with use of BLE after PT instructed pt in LE placement. Call bell in reach and all needs met at end of treatment.     Therapy Documentation Precautions:  Precautions Precautions: Fall Restrictions Weight Bearing Restrictions: No    Pain:    6/10 R shoulder  See Function Navigator for Current Functional Status.   Therapy/Group: Individual Therapy  Lorie Phenix 12/03/2015, 6:08 PM

## 2015-12-03 NOTE — Plan of Care (Signed)
Problem: RH BLADDER ELIMINATION Goal: RH STG MANAGE BLADDER WITH ASSISTANCE STG Manage Bladder With mod Assistance  Outcome: Not Progressing Pt has foley catheter in place

## 2015-12-03 NOTE — Progress Notes (Signed)
Patient Samantha Kline was 92/57 metoprolol was held and MD was notified.

## 2015-12-03 NOTE — Progress Notes (Signed)
Samantha Kline is a 55 y.o. female 12/21/1960 [id number]  Subjective: No new complaints.  Feeling OK. Weak  Objective: Vital signs in last 24 hours: Temp:  [97.2 F (36.2 C)-98 F (36.7 C)] 98 F (36.7 C) (08/12 0619) Pulse Rate:  [81-85] 82 (08/12 0619) Resp:  [16] 16 (08/12 0619) BP: (92-114)/(57-71) 114/71 (08/12 0619) SpO2:  [95 %-97 %] 95 % (08/12 0619) Weight:  [101 lb (45.8 kg)-125 lb 9.6 oz (57 kg)] 101 lb (45.8 kg) (08/12 0619) Weight change:  Last BM Date: 12/01/15  Intake/Output from previous day: 08/11 0701 - 08/12 0700 In: 547 [P.O.:310] Out: 2520 [Urine:2520] Last cbgs: CBG (last 3)  No results for input(s): GLUCAP in the last 72 hours.   Physical Exam General: No apparent distress  Appears chronically ill HEENT: not dry Lungs: Normal effort. Lungs clear to auscultation, no crackles or wheezes. Cardiovascular: Regular rate and rhythm, no edema Abdomen: S/NT/ND; BS(+) Musculoskeletal:  unchanged Neurological: No new neurological deficits Wounds: N/A    Skin: clear  Aging changes Mental state: Alert, cooperative Thin   Lab Results: BMET    Component Value Date/Time   NA 131 (L) 12/01/2015 0500   K 4.8 12/01/2015 0500   CL 102 12/01/2015 0500   CO2 26 12/01/2015 0500   GLUCOSE 82 12/01/2015 0500   BUN 11 12/01/2015 0500   CREATININE 0.79 12/01/2015 0500   CALCIUM 7.9 (L) 12/01/2015 0500   GFRNONAA >60 12/01/2015 0500   GFRAA >60 12/01/2015 0500   CBC    Component Value Date/Time   WBC 5.3 12/01/2015 0500   RBC 3.15 (L) 12/01/2015 0500   HGB 9.5 (L) 12/01/2015 0500   HCT 29.8 (L) 12/01/2015 0500   PLT 215 12/01/2015 0500   MCV 94.6 12/01/2015 0500   MCH 30.2 12/01/2015 0500   MCHC 31.9 12/01/2015 0500   RDW 17.9 (H) 12/01/2015 0500   LYMPHSABS 1.3 12/01/2015 0500   MONOABS 0.5 12/01/2015 0500   EOSABS 0.0 12/01/2015 0500   BASOSABS 0.1 12/01/2015 0500    Studies/Results: No results found.  Medications: I have reviewed the  patient's current medications.  Assessment/Plan:  1. 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 DVT Prophylaxis/Anticoagulation: Mechanical: Sequential compression devices, below knee Bilateral lower extremities 3. Pain Management: Tylenol prn 4. Mood: LCSW to follow for evaluation and support.  5. Neuropsych: This patient is not fullycapable of making decisions on herown behalf. 6. Skin/Wound Care: Ordered air mattress overlay due to malnutrition, poor intake                        Stage 2 sacral ulcer, pressure relief, protein supplementation 7. Fluids/Electrolytes/Nutrition: Continue dextrose for now.                        Soft diet at present, advance as tolerated 8. 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                        Follow up per ID. Appreciate recs 9. Candida/CMV esophagitis: Diflucan and On Ganciclovir since 08/03 10. FTT: Continue IVF for  now. Started calorie count .   11. Potential for pancytopenia: Check labs periodically given medications 12. Hyponatremia/Hypokalemia: Monitor lytes with Mg and Phos levels every 3-4 d                       Cont to monitor 13. PCP prophylaxis: Bactrim discontinued due to hyponatremia. Has been treated for PCP infection and dapsone resumed 8/4 for prophylaxis.  14. Hypoalbuminemia                       Supplement started 8/6 15. Leukocytosis: resolved, secondary to medication 16. Anemia of chronic disease                       Cont to monitor     Length of stay, days: 8  Sonda PrimesAlex Plotnikov , MD 12/03/2015, 9:45 AM

## 2015-12-04 ENCOUNTER — Inpatient Hospital Stay (HOSPITAL_COMMUNITY): Payer: Managed Care, Other (non HMO) | Admitting: Physical Therapy

## 2015-12-04 NOTE — Progress Notes (Signed)
At around 8:38 pm patient BP was 149/73 and HR was 116 but after given metoprolol, her BP dropped to 93/57, HR 118 and SPO2 92%. Patient was given O2 2L, rapid response paged and MD notified. V/S were monitor Q 15 min for an hr then qhr for 2 hrs. Patient denied any discomfort or distress during the shift. Pass it on in report. We continue to monitor.

## 2015-12-04 NOTE — Plan of Care (Signed)
Problem: RH BOWEL ELIMINATION Goal: RH STG MANAGE BOWEL WITH ASSISTANCE STG Manage Bowel with max Assistance.   Outcome: Not Progressing Poor appetite LBM 8/10; MD aware  Problem: RH BLADDER ELIMINATION Goal: RH STG MANAGE BLADDER WITH ASSISTANCE STG Manage Bladder With mod Assistance  Outcome: Not Progressing Foley catheter

## 2015-12-04 NOTE — Plan of Care (Signed)
Problem: RH BLADDER ELIMINATION Goal: RH STG MANAGE BLADDER WITH ASSISTANCE STG Manage Bladder With mod Assistance  Outcome: Not Progressing Patient has [place] in

## 2015-12-04 NOTE — Significant Event (Signed)
Rapid Response Event Note  Overview: Time Called: 2229 Arrival Time: 2240 Event Type: Hypotension  Initial Focused Assessment: Called by bedside RN to assess patient. Pt is a 55 y.o. female with medical history significant of recently diagnosed HIV, recently treated pneumonia, who presents with altered mental status, generalized weakness, nausea and vomiting. Discharge to CIR for rehab. Nurse called about patient having low BPs. Patient was given metoprolol 12.5 at scheduled time, BP was checked prior to dose giving and it was normal.  After dose was given, follow up BP SBP mid 90s, MAPs >60, HR low 100s from 110-115 range.  Patient was placed on 2L [place] prior to my arrival. Patient alert, talking, soft spoken, follows commands, warm to touch, making urine (+foley), + pulses, + good lung sounds, temp 97.9 Patient in on neutropenic precautions   Interventions: Patient was assessed, BP cycled Q3915min, each pressure improving.  Plan of Care (if not transferred): MD notified, updated. Instructed bedside RN to check BP at 2300 and 0000 and if those are normal then routine per unit.  -called at 2328 for update, SBP > 100, patient resting comforably -called at 1223 for update, SBP > 95, patient resting comfortably -walk by at 0200 for updated, BP stable, patient sleeping - patient is immunocompromised, advised RNs to follow up with MD in morning.  Event Summary: Name of Physician Notified: Dr. Sinclair GroomsPlotinkov at 1052    at    Outcome: Stayed in room and stabalized  Event End Time: 2300  Jatavis Malek R

## 2015-12-04 NOTE — Progress Notes (Signed)
Physical Therapy Weekly Progress Note  Patient Details  Name: Samantha Kline MRN: [id number] Date of Birth: 01-30-1961  Beginning of progress report period: November 26, 2015 End of progress report period: December 04, 2015  Today's Date: 12/04/2015 PT Individual Time: 0867-6195 PT Individual Time Calculation (min): 38 min    Patient has met 0 of 5 short term goals.  Pt is making slow progress towards PT goals 2/2 poor activity tolerance, poor endurance, and minimal strength. Pt continues to fatigue quickly with all activity and treatment sessions can be limited by pt's c/o dizziness with movement. Pt is making slow progress and requires Max A +1 or +2 assistance for all functional tasks. However, pt able to tolerate standing in parallel bars one on occasion.   Patient continues to demonstrate the following deficits: decreased ability to perform bed mobility, impaired static & dynamic sitting balance, decreased ability to perform transfers without Max +1 or +2 assistance, decreased activity tolerance, decreased strength and therefore will continue to benefit from skilled PT intervention to enhance overall performance with activity tolerance, balance, postural control, safety awareness, pt & family education and knowledge of precautions.  Patient progressing toward long term goals..  Continue plan of care.  PT Short Term Goals Week 1:  PT Short Term Goal 1 (Week 1): Pt will maintain static and dynamic sitting balance in midline with mod A PT Short Term Goal 1 - Progress (Week 1): Progressing toward goal PT Short Term Goal 2 (Week 1): Pt will perform bed mobility with max A with pt initiating 25% of transfer sequence PT Short Term Goal 2 - Progress (Week 1): Progressing toward goal PT Short Term Goal 3 (Week 1): Pt will perform bed <> w/c transfers with consistent max A of one person and initiating 25% of transfer PT Short Term Goal 3 - Progress (Week 1): Not met PT Short Term Goal 4 (Week 1): Pt  will tolerate 1-2 hours up in reclining w/c to improve core strength and positioning for meals PT Short Term Goal 4 - Progress (Week 1): Met PT Short Term Goal 5 (Week 1): Pt will initiate pre-gait activities in standing with max A PT Short Term Goal 5 - Progress (Week 1): Met Week 2:  PT Short Term Goal 1 (Week 2): Pt will maintain static & dynamic sitting without back support consistently with  Mod A. PT Short Term Goal 2 (Week 2): Pt will transfer supine<>sitting with use of hospital bed features & mod A. PT Short Term Goal 3 (Week 2): Pt will perform bed<>chair transfers with consistent Max A +1.  PT Short Term Goal 4 (Week 2): Pt will ambulate 10 ft in parallel bars with Max A.   Skilled Therapeutic Interventions/Progress Updates:    Pt received in bed & agreeable to PT, denying c/o pain. Pt's husband, Mitzi Hansen, present & session focused on family education & d/c planning. Educated pt & husband on pt's limited progress during this reporting period, as well as pt's ability to tolerate standing in parallel bars on one occasion. Educated pt & husband on road to recovery & potential amount of time this might take. Discussed husband's goals who states he would like pt to return to her baseline of walking around as much as possible. Mitzi Hansen reports pt will stay on 3rd floor of home after d/c (bed & bath on this level) and that he and family members can provide 24 hr care that this PT recommends. Discussed PT goals for pt while in this setting,  Mitzi Hansen being apart of PT sessions, and hands on family education prior to pt's d/c. Educated pt & husband on interdisciplinary team conference and setting d/c date during that time. Pt's husband reported no questions/concerns following conversation & pt willing to transfer bed>w/c. Pt required maximum mulitmodal cuing for rolling supine>L sidelying>sitting EOB with use of hospital bed features and Max A +1. Pt demonstrated posterior loss of balance when sitting EOB,  requiring assistance to correct. Pt's husband provided +2 assistance during set up of [employer]. Pt transferred sit<>stand with Max A + 1 requiring cuing to shift pelvis anteriorly once in standing. Pt required total A of stedy lift to transfer to TIS w/c & after task noted dizziness that dissipated with sitting. Pt's BP =120/76 mmHg & SpO2 & HR both WNL. At end of session pt left sitting reclined in TIS w/c with husband present. Educated husband on need to notify nurses when he left & he voiced understanding. Pt left with husband present & all needs within reach. Notified RN of pt position in w/c & requested they transfer her back to bed after dinner.  Therapy Documentation Precautions: Precautions Precautions: Fall Restrictions Weight Bearing Restrictions: No   See Function Navigator for Current Functional Status.  Therapy/Group: Individual Therapy  Waunita Schooner 12/04/2015, 5:25 PM

## 2015-12-04 NOTE — Progress Notes (Signed)
Samantha Kline is a 55 y.o. female 09/18/1960 [id number]  Subjective: Low BP. Poor oral intake. Weak  Objective: Vital signs in last 24 hours: Temp:  [97.4 F (36.3 C)-99.5 F (37.5 C)] 97.6 F (36.4 C) (08/13 0557) Pulse Rate:  [71-118] 71 (08/13 0557) Resp:  [16-18] 18 (08/13 0557) BP: (92-149)/(53-76) 127/69 (08/13 0557) SpO2:  [92 %-100 %] 100 % (08/13 0557) Weight:  [94 lb (42.6 kg)] 94 lb (42.6 kg) (08/13 0557) Weight change: -31 lb 9.6 oz (-14.334 kg) Last BM Date: 12/01/15  Intake/Output from previous day: 08/12 0701 - 08/13 0700 In: 0  Out: 1450 [Urine:1450] Last cbgs: CBG (last 3)  No results for input(s): GLUCAP in the last 72 hours.   Physical Exam General: No apparent distress  Appears chronically ill HEENT: not dry Lungs: Normal effort. Lungs clear to auscultation, no crackles or wheezes. Cardiovascular: Regular rate and rhythm, no edema Abdomen: S/NT/ND; BS(+) Musculoskeletal:  unchanged Neurological: No new neurological deficits Wounds: N/A    Skin: clear  Aging changes Mental state: Alert, cooperative Thin   Lab Results: BMET    Component Value Date/Time   NA 131 (L) 12/01/2015 0500   K 4.8 12/01/2015 0500   CL 102 12/01/2015 0500   CO2 26 12/01/2015 0500   GLUCOSE 82 12/01/2015 0500   BUN 11 12/01/2015 0500   CREATININE 0.79 12/01/2015 0500   CALCIUM 7.9 (L) 12/01/2015 0500   GFRNONAA >60 12/01/2015 0500   GFRAA >60 12/01/2015 0500   CBC    Component Value Date/Time   WBC 5.3 12/01/2015 0500   RBC 3.15 (L) 12/01/2015 0500   HGB 9.5 (L) 12/01/2015 0500   HCT 29.8 (L) 12/01/2015 0500   PLT 215 12/01/2015 0500   MCV 94.6 12/01/2015 0500   MCH 30.2 12/01/2015 0500   MCHC 31.9 12/01/2015 0500   RDW 17.9 (H) 12/01/2015 0500   LYMPHSABS 1.3 12/01/2015 0500   MONOABS 0.5 12/01/2015 0500   EOSABS 0.0 12/01/2015 0500   BASOSABS 0.1 12/01/2015 0500    Studies/Results: No results found.  Medications: I have reviewed the patient's  current medications.  Assessment/Plan:  1. 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 DVT Prophylaxis/Anticoagulation: Mechanical: Sequential compression devices, below knee Bilateral lower extremities 3. Pain Management: Tylenol prn 4. Mood: LCSW to follow for evaluation and support.  5. Neuropsych: This patient is not fullycapable of making decisions on herown behalf. 6. Skin/Wound Care: Ordered air mattress overlay due to malnutrition, poor intake                        Stage 2 sacral ulcer, pressure relief, protein supplementation 7. Fluids/Electrolytes/Nutrition: Continue dextrose for now.                        Soft diet at present, advance as tolerated 8. 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                        Follow up per ID. Appreciate recs 9. Candida/CMV esophagitis: Diflucan and On Ganciclovir since 08/03 10. FTT: Continue IVF for now.  Started calorie count .   11. Potential for pancytopenia: Check labs periodically given medications 12. Hyponatremia/Hypokalemia: Monitor lytes with Mg and Phos levels every 3-4 d                       Cont to monitor 13. PCP prophylaxis: Bactrim discontinued due to hyponatremia. Has been treated for PCP infection and dapsone resumed 8/4 for prophylaxis.  14. Hypoalbuminemia                       Supplement started 8/6 15. Leukocytosis: resolved, secondary to medication 16. Anemia of chronic disease                       Cont to monitor 17. Hypotension - d/c metoprolol     Length of stay, days: 9  Sonda PrimesAlex Jabria Loos , MD 12/04/2015, 8:18 AM

## 2015-12-05 ENCOUNTER — Inpatient Hospital Stay (HOSPITAL_COMMUNITY): Payer: Managed Care, Other (non HMO) | Admitting: Occupational Therapy

## 2015-12-05 ENCOUNTER — Inpatient Hospital Stay (HOSPITAL_COMMUNITY): Payer: Managed Care, Other (non HMO) | Admitting: Physical Therapy

## 2015-12-05 ENCOUNTER — Inpatient Hospital Stay (HOSPITAL_COMMUNITY): Payer: Managed Care, Other (non HMO) | Admitting: Speech Pathology

## 2015-12-05 DIAGNOSIS — I9589 Other hypotension: Secondary | ICD-10-CM

## 2015-12-05 DIAGNOSIS — I959 Hypotension, unspecified: Secondary | ICD-10-CM | POA: Insufficient documentation

## 2015-12-05 LAB — COMPREHENSIVE METABOLIC PANEL WITH GFR
ALT: 16 U/L (ref 14–54)
AST: 26 U/L (ref 15–41)
Albumin: 1.4 g/dL — ABNORMAL LOW (ref 3.5–5.0)
Alkaline Phosphatase: 95 U/L (ref 38–126)
Anion gap: 4 — ABNORMAL LOW (ref 5–15)
BUN: 13 mg/dL (ref 6–20)
CO2: 29 mmol/L (ref 22–32)
Calcium: 8.1 mg/dL — ABNORMAL LOW (ref 8.9–10.3)
Chloride: 101 mmol/L (ref 101–111)
Creatinine, Ser: 0.66 mg/dL (ref 0.44–1.00)
GFR calc Af Amer: >60 mL/min
GFR calc non Af Amer: >60 mL/min
Glucose, Bld: 95 mg/dL (ref 65–99)
Potassium: 4.6 mmol/L (ref 3.5–5.1)
Sodium: 134 mmol/L — ABNORMAL LOW (ref 135–145)
Total Bilirubin: 0.6 mg/dL (ref 0.3–1.2)
Total Protein: 6.6 g/dL (ref 6.5–8.1)

## 2015-12-05 LAB — CBC WITH DIFFERENTIAL/PLATELET
Basophils Absolute: 0.1 10*3/uL (ref 0.0–0.1)
Basophils Relative: 2 %
Eosinophils Absolute: 0 10*3/uL (ref 0.0–0.7)
Eosinophils Relative: 1 %
HCT: 29.9 % — ABNORMAL LOW (ref 36.0–46.0)
Hemoglobin: 9.5 g/dL — ABNORMAL LOW (ref 12.0–15.0)
Lymphocytes Relative: 32 %
Lymphs Abs: 1.8 10*3/uL (ref 0.7–4.0)
MCH: 30.6 pg (ref 26.0–34.0)
MCHC: 31.8 g/dL (ref 30.0–36.0)
MCV: 96.5 fL (ref 78.0–100.0)
Monocytes Absolute: 0.4 10*3/uL (ref 0.1–1.0)
Monocytes Relative: 7 %
Neutro Abs: 3.4 10*3/uL (ref 1.7–7.7)
Neutrophils Relative %: 60 %
Platelets: 289 10*3/uL (ref 150–400)
RBC: 3.1 MIL/uL — ABNORMAL LOW (ref 3.87–5.11)
RDW: 17.4 % — ABNORMAL HIGH (ref 11.5–15.5)
WBC: 5.6 10*3/uL (ref 4.0–10.5)

## 2015-12-05 MED ORDER — SODIUM CHLORIDE 0.9 % IV SOLN
2.5000 mg/kg | Freq: Two times a day (BID) | INTRAVENOUS | Status: DC
  Administered 2015-12-05 – 2015-12-15 (×20): 125 mg via INTRAVENOUS
  Filled 2015-12-05 (×25): qty 125

## 2015-12-05 MED ORDER — SODIUM CHLORIDE 0.9 % IV SOLN
INTRAVENOUS | Status: DC
  Administered 2015-12-05 (×2): via INTRAVENOUS
  Administered 2015-12-06: 150 mL/h via INTRAVENOUS
  Administered 2015-12-06: 03:00:00 via INTRAVENOUS
  Administered 2015-12-06: 150 mL/h via INTRAVENOUS
  Administered 2015-12-07 – 2015-12-08 (×2): via INTRAVENOUS

## 2015-12-05 NOTE — Progress Notes (Signed)
Daily Progress Note   Patient Name: Samantha Kline       Date: 12/05/2015 DOB: 01/27/1961  Age: 55 y.o. MRN#: [id number] Attending Physician: Jamse Arn, MD Primary Care Physician: No PCP Per Patient Admit Date: 11/25/2015  Reason for Consultation/Follow-up: Establishing goals of care  Subjective: Rapid Response at 3:30 am 8/13 noted. Per RN patient ate 50%+ of her breakfast today.  Physical Therapy ended their session at the patient's heart rate was 130. Met with patient and husband Samantha Kline at bedside.    We discussed Samantha Kline's medical condition at a high level.  I advised Samantha Kline that while we hoped for the best and wanted to remain optimistic - patient's who have had Samantha Kline's condition in the past have sometimes passed away.  While we feel that she will eventually improve, she needs to be strong enough to allow for time to improve.  Samantha Kline was surprised but seemed to understand.  We discussed Advanced Directives/Living Will.  Samantha Kline had a brother who had been on life support.  He and his family had to make the decision to turn off life support - so he seemed to understand the importance of a living will and documenting his wife's wishes.  I explained the paper work to him.  He asked to speak with his wife about it privately.  I will plan to follow up with the Samantha Kline family in the next day or so.  Length of Stay: 10  Current Medications: Scheduled Meds:  . amitriptyline  10 mg Oral QHS  . antiseptic oral rinse  7 mL Mouth Rinse BID  . dapsone  100 mg Oral Daily  . dimethicone   Topical TID PC  . dolutegravir  50 mg Oral Q1200  . dronabinol  5 mg Oral BID AC  . emtricitabine-tenofovir AF  1 tablet Oral Q1200  . feeding supplement (ENSURE ENLIVE)  237 mL Oral QID  . feeding  supplement (PRO-STAT SUGAR FREE 64)  30 mL Oral Q1500  . ferrous sulfate  325 mg Oral Q breakfast  . fluconazole  200 mg Oral Daily  . ganciclovir (CYTOVENE) IV  2.5 mg/kg Intravenous Q12H  . Gerhardt's butt cream   Topical BID  . multivitamin with minerals  1 tablet Oral Daily  . potassium chloride  20 mEq Oral BID  .  protein supplement  1 scoop Oral TID WC  . vitamin B-12  100 mcg Oral Daily  . Vitamin D (Ergocalciferol)  50,000 Units Oral Q Fri    Continuous Infusions: . sodium chloride 150 mL/hr at 12/05/15 1137    PRN Meds: acetaminophen, albuterol, alum & mag hydroxide-simeth, bisacodyl, dextromethorphan-guaiFENesin, diphenhydrAMINE, ondansetron, prochlorperazine **OR** prochlorperazine **OR** prochlorperazine, senna-docusate, sodium phosphate, traZODone  Physical Exam    Thin, frail female who speaks with a very soft voice.   Making minimal movements, +Pallor CV rrr Resp nad Abdomen soft thin Ext able to move all 4, no edema.  Vital Signs: BP 125/76 (BP Location: Left Arm)   Pulse (!) 103   Temp 98.4 F (36.9 C) (Oral)   Resp 16   Ht '5\' 4"'$  (1.626 m)   Wt 49.1 kg (108 lb 3.6 oz)   SpO2 100%   BMI 18.58 kg/m  SpO2: SpO2: 100 % O2 Device: O2 Device: Nasal Cannula O2 Flow Rate: O2 Flow Rate (L/min): 2 L/min  Intake/output summary:   Intake/Output Summary (Last 24 hours) at 12/05/15 1534 Last data filed at 12/05/15 1430  Gross per 24 hour  Intake              960 ml  Output             2501 ml  Net            -1541 ml   LBM: Last BM Date: 12/05/15 Baseline Weight: Weight: 60.3 kg (133 lb) Most recent weight: Weight: 49.1 kg (108 lb 3.6 oz)       Palliative Assessment/Data:    Flowsheet Rows   Flowsheet Row Most Recent Value  Intake Tab  Referral Department  -- [CIR]  Unit at Time of Referral  Other (Comment)  Palliative Care Primary Diagnosis  Neurology  Date Notified  11/29/15  Palliative Care Type  New Palliative care  Reason for referral  Clarify  Goals of Care  Date of Admission  11/25/15  Date first seen by Palliative Care  11/29/15  # of days Palliative referral response time  0 Day(s)  # of days IP prior to Palliative referral  4  Clinical Assessment  Palliative Performance Scale Score  30%  Psychosocial & Spiritual Assessment  Palliative Care Outcomes  Patient/Family meeting held?  Yes  Who was at the meeting?  patient      Patient Active Problem List   Diagnosis Date Noted  . Hypotension   . Anorexia   . Stage 2 skin ulcer of sacral region   . Mycobacterium avium complex ([place])   . Thrush   . Palliative care encounter   . Goals of care, counseling/discussion   . Leukocytosis   . 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 Benign essential HTN   . Dysphagia   . Antineoplastic chemotherapy induced pancytopenia ([place])   . Drug-induced folate deficiency anemia   . Physical debility 11/25/2015  . Esophageal ulcer   . Malnutrition of moderate degree 11/22/2015  . Anemia due to bone marrow failure ([place])   . 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 Esophagitis, CMV   . FTT (failure to thrive) in adult   . Physical deconditioning   . AIDS ([place])   . Candida esophagitis ([place])   . FUO (fever of unknown origin)   . Arterial hypotension   . Tachypnea   . Tachycardia   . Anemia of chronic disease   . Hyperkalemia 11/07/2015  . Nausea without vomiting 11/07/2015  .  Pressure ulcer 11/07/2015  . Elevated lactic acid level 11/07/2015  . Sepsis ([place])   . Acute encephalopathy 11/06/2015  . Hyponatremia 11/06/2015  . Bilateral pneumonia   . Protein-calorie malnutrition, severe ([place]) 10/28/2015  . CAP (community acquired pneumonia) 10/27/2015  . Human immunodeficiency virus (HIV) disease ([place])   . Acute hypoxemic respiratory failure ([place]) 10/25/2015  . Hypokalemia   . 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 Anemia 10/24/2015  . Weight loss     Palliative Care Assessment & Plan   Patient  Profile: 55 y.o. female  with past medical history of anemia, 50+ lb weight loss and recently diagnosed AIDS infection.  She was admitted on 11/25/2015 for rehabilitation after being discharged from a hospital stay for hyponatremia, fever of unknown origin (PCP infection vs IRIS), severe protein calorie malnutrition and severe dysphagia secondary to ulcerations from possible CMV vs HSV esophagitis.  Albumin of 1.3. She currently has poor functional status and is too weak to walk.  She is refusing a PEG, but is attempting to consume protein drinks.  Assessment: Extremely weak 55 yo female with AIDS who started ART therapy and dropped both her viral load and her CD4 count dramatically (IRIS?).  Also diagnosed with ulcerative esophagitis.  Path indicates possible CMV.    Recommendations/Plan:  Full Scope Treatment  Will work with family on HCPOA and Living Will  - hopefully will finalize this documentation this week.  PMT will continue to follow intermittently and maintain a relationship with Mrs. Owens Shark  Out Patient Palliative Follow up is recommended at discharge.    Code Status:  Full Code  Prognosis:  Unable to determine.  While she is extremely weak and frail - it is anticipated that this is temporary and she has an opportunity to gain strength back.  Discharge Planning:  To Be Determined.  Palliative follow up recommended at discharge for continued support.  Care plan was discussed with Dr. Tommy Medal, patient  Thank you for allowing the Palliative Medicine Team to assist in the care of this patient.   Time In: 11:00 Time Out: 11:30 Total Time 30 Prolonged Time Billed no      Greater than 50%  of this time was spent counseling and coordinating care related to the above assessment and plan.  Imogene Burn, PA-C Palliative Medicine Pager: [phone]  Please contact Palliative Medicine Team phone at [phone] for questions and concerns.

## 2015-12-05 NOTE — Progress Notes (Addendum)
Ganciclovir per pharmacy  Samantha Brownis a54 y.o.femaleadmittedon 8/4/2017with CMV. Pt has been transferred to Rehab and Pharmacy has been consulted for continued Ganciclovir dosing.  There was discrepancy in weight; we have been dosing her with 60 kg and the weight yesterday was 42.6 kg?  RN reweighted the patient and patient hasn't been eating great; the weight now is confirmed at 49.1 kg  Renal function stable, scr 0.66 stable hgb 9.5, pltc 289K, ANC 3.4 K  Plan: Adjust Ganciclovir (2.5mg /kg)IV q12 based on new weight Monitor CBC  Height: 5\' 4"  (162.6 cm) Weight: 94 lb (42.6 kg) IBW/kg (Calculated) : 54.7  Temp (24hrs), Avg:98.5 F (36.9 C), Min:98.3 F (36.8 C), Max:98.6 F (37 C)   Recent Labs Lab 11/28/15 1027 11/29/15 0705 11/30/15 0641 12/01/15 0500 12/05/15 0505  WBC  --  4.1 4.3 5.3 5.6  CREATININE 0.75  --   --  0.79 0.66    Estimated Creatinine Clearance: 52.8 mL/min (by C-G formula based on SCr of 0.8 mg/dL).    Allergies  Allergen Reactions  . Septra [Sulfamethoxazole-Trimethoprim]     Possible cause of hyponatremia     Thank you for allowing pharmacy to be a part of this patient's care.  Bobbyjoe Pabst, Tsz-Yin, PharmD, BCPS 12/05/2015 8:26 AM

## 2015-12-05 NOTE — Plan of Care (Signed)
Problem: RH BLADDER ELIMINATION Goal: RH STG MANAGE BLADDER WITH ASSISTANCE STG Manage Bladder With mod Assistance  Outcome: Not Progressing Patient has Foley

## 2015-12-05 NOTE — Progress Notes (Signed)
[facility]     PROGRESS NOTE  Subjective/Complaints:  Pt laying in bed this AM.  She states that she had a "great weekend" and also mentions that she is eating better.   ROS: +Fatigued. Denies CP, SOB, N/V/D.  Objective: Vital Signs: Blood pressure (!) 91/55, pulse (!) 134, temperature 97.6 F (36.4 C), resp. rate 17, height 5\' 4"  (1.626 m), weight 49.1 kg (108 lb 3.6 oz), SpO2 100 %. No results found.  Recent Labs  12/05/15 0505  WBC 5.6  HGB 9.5*  HCT 29.9*  PLT 289    Recent Labs  12/05/15 0505  NA 134*  K 4.6  CL 101  GLUCOSE 95  BUN 13  CREATININE 0.66  CALCIUM 8.1*   CBG (last 3)  No results for input(s): GLUCAP in the last 72 hours.  Wt Readings from Last 3 Encounters:  12/05/15 49.1 kg (108 lb 3.6 oz)  11/23/15 60.6 kg (133 lb 11.2 oz)  10/31/15 58.6 kg (129 lb 3 oz)    Physical Exam:  BP (!) 91/55 (BP Location: Left Arm)   Pulse (!) 134   Temp 97.6 F (36.4 C)   Resp 17   Ht 5\' 4"  (1.626 m)   Wt 49.1 kg (108 lb 3.6 oz)   SpO2 100%   BMI 18.58 kg/m  Constitutional: Vital signs are normal. NAD. She appears cachectic. She has a sickly appearance.  HENT: Normocephalic and atraumatic.  Mouth/Throat: Oral lesions present.  Eyes: Conjunctivae and EOM are normal.  Cardiovascular: Regular rate and rhythm. No murmur heard. Respiratory: +[place]. Effort normal and breath sounds normal. No stridor. No respiratory distress. She has no wheezes.  GI: Soft. Bowel sounds are normal. She exhibits no distension. There is no tenderness.  Musculoskeletal: She exhibits no edema or tenderness.  Muscle wasting all limbs.  Neurological: She appears listless.  Motor:  B/l UE: 4/5 deltoid, biceps, triceps, 4/5 wrist and HI.  B/l LE: 4-/5 HF, 4/5 KE/KF and 4/5 ADF/PF (poor tolerance - improving).  Skin: Skin is warm and dry. No rash noted. No erythema.  6 small stage 2 ulcers sacrum area (not examined today). Psychiatric: Her affect is  blunt. She is slowed and withdrawn (improving). Cognition and memory are impaired.   Assessment/Plan: 1. 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 Physiatrist is providing close team supervision and 24 hour management of active medical problems listed below. Physiatrist and rehab team continue to assess barriers to discharge/monitor patient progress toward functional and medical goals.  Function:  Bathing Bathing position   Position: Shower  Bathing parts Body parts bathed by patient: Chest, Left upper leg, Right lower leg Body parts bathed by helper: Right arm, Left arm, Front perineal area, Buttocks, Right upper leg, Left lower leg, Abdomen  Bathing assist Assist Level: Touching or steadying assistance(Pt > 75%)      Upper Body Dressing/Undressing Upper body dressing   What is the patient wearing?: Pull over shirt/dress     Pull over shirt/dress - Perfomed by patient: Pull shirt over trunk Pull over shirt/dress - Perfomed by helper: Thread/unthread right sleeve, Thread/unthread left sleeve, Put head through opening Button up shirt - Perfomed by patient: Thread/unthread left sleeve Button up shirt - Perfomed by helper: Thread/unthread right sleeve, Pull shirt around back, Button/unbutton shirt    Upper body assist Assist Level: Touching or steadying assistance(Pt > 75%)   Set up :  To obtain clothing/put away  Lower Body Dressing/Undressing Lower body dressing   What is the patient wearing?: Non-skid slipper socks, Pants, Ted Hose   Underwear - Performed by helper: Thread/unthread right underwear leg, Thread/unthread left underwear leg, Pull underwear up/down Pants- Performed by patient: Thread/unthread right pants leg Pants- Performed by helper: Thread/unthread left pants leg, Pull pants up/down   Non-skid slipper socks- Performed by helper: Don/doff right sock,  Don/doff left sock               TED Hose - Performed by helper: Don/doff right TED hose, Don/doff left TED hose  Lower body assist Assist for lower body dressing: Touching or steadying assistance (Pt > 75%)      Toileting Toileting Toileting activity did not occur: No continent bowel/bladder event   Toileting steps completed by helper: Adjust clothing prior to toileting, Performs perineal hygiene, Adjust clothing after toileting Toileting Assistive Devices: Grab bar or rail, Other (comment) ([place])  Toileting assist Assist level: Touching or steadying assistance (Pt.75%), More than reasonable time, Two helpers   Transfers Chair/bed transfer Chair/bed transfer activity did not occur: Safety/medical concerns Chair/bed transfer method: Other Chair/bed transfer assist level: 2 helpers Chair/bed transfer assistive device: Mechanical lift Mechanical lift: Landscape architecttedy   Locomotion Ambulation Ambulation activity did not occur: Safety/medical concerns         Wheelchair   Type: Manual Max wheelchair distance: 150 Assist Level: Dependent (Pt equals 0%)  Cognition Comprehension Comprehension assist level: Understands basic 75 - 89% of the time/ requires cueing 10 - 24% of the time  Expression Expression assist level: Expresses basic 50 - 74% of the time/requires cueing 25 - 49% of the time. Needs to repeat parts of sentences.  Social Interaction Social Interaction assist level: Interacts appropriately 75 - 89% of the time - Needs redirection for appropriate language or to initiate interaction.  Problem Solving Problem solving assist level: Solves basic 75 - 89% of the time/requires cueing 10 - 24% of the time  Memory Memory assist level: Recognizes or recalls 75 - 89% of the time/requires cueing 10 - 24% of the time    Medical Problem List and Plan: 1.  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  DVT Prophylaxis/Anticoagulation:  Mechanical: Sequential compression devices, below knee Bilateral lower extremities 3. Pain Management: Tylenol prn 4. Mood: LCSW to follow for evaluation and support.  5. Neuropsych: This patient is not fully capable of making decisions on her own behalf. 6. Skin/Wound Care: Ordered air mattress overlay due to malnutrition, poor intake   Stage 2 sacral ulcer, pressure relief, protein supplementation 7. Fluids/Electrolytes/Nutrition:  Continue dextrose for now.   Liquid diet at present, will advance as tolerated 8. HIV/AIDS with MAC?:  On descovy, ticovey  Biaxin and ethambutol d/ced per ID on 8/9.   Follow up per ID. Appreciate recs 9. Candida/CMV esophagitis: Diflucan and On Ganciclovir since 08/03 10. FTT: Continue IVF for now. Started calorie count .   11. Potential for pancytopenia:  Check labs periodically given medications 12. Hyponatremia/Hypokalemia: Monitor lytes with Mg and Phos levels every 3-4 days.   Na+ 134 on 8/14: Will cont to monitor, treat if necessary  K+ 4.6 on 8/14.   Cont to monitor 13. PCP prophylaxis: Bactrim discontinued due to hyponatremia. Has been treated for PCP infection and dapsone resumed 8/4  for prophylaxis.  14. Hypoalbuminemia  Supplement started 8/6 15. Leukocytosis: resolved, secondary to medication 16. Anemia of chronic disease  Hb 9.5 on 8/14  Cont to monitor 17. Hypotension/Tachycardia  IVF on 8/14  LOS (Days) 10 A FACE TO FACE EVALUATION WAS PERFORMED  Ankit Karis Juba 12/05/2015 10:42 AM

## 2015-12-05 NOTE — Progress Notes (Signed)
Speech Language Pathology Daily Session Note  Patient Details  Name: Samantha Kline MRN: [id number] Date of Birth: 06/14/1960  Today's Date: 12/05/2015 SLP Individual Time: 1400-1445 SLP Individual Time Calculation (min): 45 min   Short Term Goals:Week 2: SLP Short Term Goal 1 (Week 2): Patient will initiate verbal expression of basic wants and needs with Min question cues  SLP Short Term Goal 2 (Week 2): Patient will problem solve use of call bell with Min assist verbal and visual cues  SLP Short Term Goal 3 (Week 2): Patient will initiate self-care tasks in under 15 seconds with no more than 2 verbal cues  SLP Short Term Goal 4 (Week 2): Patient will utilize external aids to recall daily information with Min assist cues SLP Short Term Goal 5 (Week 2): Patient will demonstrate sustained attention to familiar tasks for 5 minutes with Mod verbal cues for redirection  SLP Short Term Goal 6 (Week 2): Pt will consume 25% of full liquids diet with mod encouragement  Skilled Therapeutic Interventions:  Pt was seen for skilled ST targeting goals for cognition and dysphagia.  Pt had been incontinent of bowel upon therapist's arrival and did not initiate request for assistance until questioned.  Pt assisted in hygiene by rolling and bridging along with assistance from nurse tech to don clean brief.  Pt initiated request for ice cream with min assist verbal cues.  Pt consumed ~3 bites of magic cup without overt s/s of aspiration and timely clearance of purees from the oral cavity without difficulty.  Mod assist needed to recall details from previous therapies during conversations with therapist.  Pt was left in bed with call bell within reach.  Continue per current plan of care.     Function:  Eating Eating   Modified Consistency Diet: Yes Eating Assist Level: Supervision or verbal cues           Cognition Comprehension Comprehension assist level: Understands basic 90% of the time/cues < 10% of  the time  Expression   Expression assist level: Expresses basic 50 - 74% of the time/requires cueing 25 - 49% of the time. Needs to repeat parts of sentences.  Social Interaction Social Interaction assist level: Interacts appropriately 75 - 89% of the time - Needs redirection for appropriate language or to initiate interaction.  Problem Solving Problem solving assist level: Solves basic 50 - 74% of the time/requires cueing 25 - 49% of the time  Memory Memory assist level: Recognizes or recalls 50 - 74% of the time/requires cueing 25 - 49% of the time    Pain Pain Assessment Pain Assessment: No/denies pain  Therapy/Group: Individual Therapy  Ruchy Wildrick, [name] 12/05/2015, 2:46 PM

## 2015-12-05 NOTE — Progress Notes (Signed)
Occupational Therapy Session Note  Patient Details  Name: Samantha Kline MRN: [id number] Date of Birth: 1961/01/29  Today's Date: 12/05/2015 OT Individual Time: 1006-1050 OT Individual Time Calculation (min): 44 min   Short Term Goals: Week 2:  OT Short Term Goal 1 (Week 2): Pt will complete functional toilet transfer with Max A OT Short Term Goal 2 (Week 2): Pt will complete UB bathing with Min A OT Short Term Goal 3 (Week 2): Pt will complete sit to stand for LB bathing with Max A OT Short Term Goal 4 (Week 2): Pt will engage in 5 minutes of therapeutic activity without rest to increase activity tolerance for self-care tasks  Skilled Therapeutic Interventions/Progress Updates:   OT treatment focused on family education, bed level bathing/dressing, bed mobility, endurance, and strengthening. Mod A for sup<>sit, w/ HR up to 130 (95 in supine). Declined transfer 2/2 increased HR . Pt returned to bed and completed bed level bathing and dressing w/ Mod A. Pt able to bridge bottom with OT providing foot support, in order to pull pants up over hips with Min A. She also completed upper body dressing in long-sitting, requiring Mod A to sit up, and Min A to pull shirt down over trunk. HR up to 125 in long sitting, OT educated pt's spouse on progression to shower/toilet transfers and how to assist with bathing/dressing tasks. Pt left supine in bed with needs met and family present.  Therapy Documentation Precautions:  Precautions Precautions: Fall Restrictions Weight Bearing Restrictions: No General: General PT Missed Treatment Reason: Other (Comment) (medical/elevated HR at rest) Vital Signs: Therapy Vitals Pulse Rate: (!) 130 (sitting EOB, 96 at rest, 123 in long-sitting) BP: (!) 95/54 Patient Position (if appropriate): Lying Oxygen Therapy SpO2: 100 % O2 Device: Nasal Cannula O2 Flow Rate (L/min): 2 L/min Pain: Pain Assessment Pain Assessment: No/denies pain  See Function Navigator for  Current Functional Status.   Therapy/Group: Individual Therapy  Valma Cava 12/05/2015, 12:28 PM

## 2015-12-05 NOTE — Progress Notes (Signed)
Physical Therapy Session Note  Patient Details  Name: Samantha Kline MRN: [id number] Date of Birth: 10-24-1960  Today's Date: 12/05/2015 PT Individual Time: [id number] and [id number] PT Individual Time Calculation (min): 45 min and 31 min   Short Term Goals: Week 2:  PT Short Term Goal 1 (Week 2): Pt will maintain static & dynamic sitting without back support consistently with  Mod A. PT Short Term Goal 2 (Week 2): Pt will transfer supine<>sitting with use of hospital bed features & mod A. PT Short Term Goal 3 (Week 2): Pt will perform bed<>chair transfers with consistent Max A +1.  PT Short Term Goal 4 (Week 2): Pt will ambulate 10 ft in parallel bars with Max A.  Skilled Therapeutic Interventions/Progress Updates:    Treatment 1: Pt received in bed & agreeable to PT, denying c/o pain. PT observed pt's nasal cannula on floor; vitals at rest: HR = 104 bpm, SpO2 on room air = 99%. Pt transferred supine>L sidelying>sitting at EOB with max A + 1 with pt initiating movements, but requiring cuing for technique, assistance from bed rails & HOB elevated, and assistance to complete movement. Once sitting at EOB pt reported dizziness; BP = 92/61 mmHg, HR = 142 bpm. Pt returned to bed via +2 total A & vitals reassessed. In supine HR decreased to 109 bpm, BP = 99/64 mmHg. Notified RN of pt's elevated HR & low BP at rest & sitting EOB. PA made aware; PA reports pt does not require supplemental oxygen if SpO2 > or = 90% on room air, regardless of HR. Session ended early 2/2 pt's abnormal vitals; PA reports, & order states, therapy should be occur only when pt's HR <120 bpm at rest. Pt left in bed with husband present & call bell within reach.  Treatment 2: Pt received in bed in handoff from ST. Pt agreeable to PT denying c/o pain at rest. Supine in bed at rest pt's HR = 99-132bpm, SpO2 = 100% on 1L/min, and BP = 124/79 mmHg. Pt c/o pain "up under my chest" but pointed to stomach; RN made aware. Later pt c/o  intermittent stomach pain. Monitored pt's HR constantly during session and only performed supine therapeutic exercises when resting HR <120 bpm. Pt performed: hip flexion, hip adduction pillow squeezes, and hip abduction all AAROM. At end of session pt left in bed with all needs within reach, HR = 92 bpm, SpO2 = 100% on 1L/min.  Therapy Documentation Precautions:  Precautions Precautions: Fall Restrictions Weight Bearing Restrictions: No   General: PT Amount of Missed Time (min): 15 Minutes PT Missed Treatment Reason: Other (Comment) (medical/elevated HR at rest)   Pain: Pain Assessment Pain Assessment: No/denies pain   See Function Navigator for Current Functional Status.   Therapy/Group: Individual Therapy  Sandi MariscalVictoria M Miller 12/05/2015, 8:54 AM

## 2015-12-06 ENCOUNTER — Inpatient Hospital Stay (HOSPITAL_COMMUNITY): Payer: Managed Care, Other (non HMO) | Admitting: Speech Pathology

## 2015-12-06 ENCOUNTER — Inpatient Hospital Stay (HOSPITAL_COMMUNITY): Payer: Managed Care, Other (non HMO) | Admitting: Occupational Therapy

## 2015-12-06 ENCOUNTER — Inpatient Hospital Stay (HOSPITAL_COMMUNITY): Payer: Managed Care, Other (non HMO) | Admitting: Physical Therapy

## 2015-12-06 DIAGNOSIS — R451 Restlessness and agitation: Secondary | ICD-10-CM

## 2015-12-06 LAB — CYTOMEGALOVIRUS (CMV) CULTURE - CMVCUL

## 2015-12-06 MED ORDER — PRO-STAT SUGAR FREE PO LIQD
30.0000 mL | Freq: Three times a day (TID) | ORAL | Status: DC
  Administered 2015-12-06 – 2015-12-13 (×17): 30 mL via ORAL
  Filled 2015-12-06 (×18): qty 30

## 2015-12-06 NOTE — Progress Notes (Signed)
[place] PHYSICAL MEDICINE & REHABILITATION     PROGRESS NOTE  Subjective/Complaints:  Pt laying in bed this AM, eating breakfast.  She appears more confused and slightly agitated.  She states she slept well.   ROS: +Fatigued. Denies CP, SOB, N/V/D.  Objective: Vital Signs: Blood pressure 138/81, pulse 79, temperature 97.1 F (36.2 C), temperature source Oral, resp. rate 16, height 5\' 4"  (1.626 m), weight 49.1 kg (108 lb 3.6 oz), SpO2 100 %. No results found.  Recent Labs  12/05/15 0505  WBC 5.6  HGB 9.5*  HCT 29.9*  PLT 289    Recent Labs  12/05/15 0505  NA 134*  K 4.6  CL 101  GLUCOSE 95  BUN 13  CREATININE 0.66  CALCIUM 8.1*   CBG (last 3)  No results for input(s): GLUCAP in the last 72 hours.  Wt Readings from Last 3 Encounters:  12/05/15 49.1 kg (108 lb 3.6 oz)  11/23/15 60.6 kg (133 lb 11.2 oz)  10/31/15 58.6 kg (129 lb 3 oz)    Physical Exam:  BP 138/81 (BP Location: Right Arm)   Pulse 79   Temp 97.1 F (36.2 C) (Oral)   Resp 16   Ht 5\' 4"  (1.626 m)   Wt 49.1 kg (108 lb 3.6 oz)   SpO2 100%   BMI 18.58 kg/m  Constitutional: Vital signs are normal. NAD. She appears cachectic. She has a sickly appearance.  HENT: Normocephalic and atraumatic.  Mouth/Throat: Oral lesions present.  Eyes: Conjunctivae and EOM are normal.  Cardiovascular: Regular rate and rhythm. No murmur heard. Respiratory: +Farley. Effort normal and breath sounds normal. No stridor. No respiratory distress. She has no wheezes.  GI: Soft. Bowel sounds are normal. She exhibits no distension. There is no tenderness.  Musculoskeletal: She exhibits no edema or tenderness.  Muscle wasting all limbs.  Neurological:   Motor:  B/l UE: 4/5 deltoid, biceps, triceps, 4/5 wrist and HI.  B/l LE: 4-/5 HF, 4/5 KE/KF and 4/5 ADF/PF (poor tolerance - improving).  Skin: Skin is warm and dry. No rash noted. No erythema.  6 small stage 2 ulcers sacrum area (not examined today). Psychiatric: Her affect  is blunt. She is slowed and agitated. Cognition and memory are impaired.   Assessment/Plan: 1. 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 Physiatrist is providing close team supervision and 24 hour management of active medical problems listed below. Physiatrist and rehab team continue to assess barriers to discharge/monitor patient progress toward functional and medical goals.  Function:  Bathing Bathing position   Position: Bed  Bathing parts Body parts bathed by patient: Chest, Left arm, Right arm, Abdomen, Right upper leg, Left upper leg Body parts bathed by helper: Front perineal area, Buttocks, Left lower leg, Right lower leg  Bathing assist Assist Level: Touching or steadying assistance(Pt > 75%), Set up   Set up : To obtain items  Upper Body Dressing/Undressing Upper body dressing   What is the patient wearing?: Pull over shirt/dress     Pull over shirt/dress - Perfomed by patient: Thread/unthread right sleeve, Thread/unthread left sleeve Pull over shirt/dress - Perfomed by helper: Put head through opening, Pull shirt over trunk Button up shirt - Perfomed by patient: Thread/unthread left sleeve Button up shirt - Perfomed by helper: Thread/unthread right sleeve, Pull shirt around back, Button/unbutton shirt    Upper body assist Assist Level: Touching or steadying assistance(Pt > 75%)  Set up : To obtain clothing/put away  Lower Body Dressing/Undressing Lower body dressing   What is the patient wearing?: Non-skid slipper socks, Pants   Underwear - Performed by helper: Thread/unthread right underwear leg, Thread/unthread left underwear leg, Pull underwear up/down Pants- Performed by patient: Thread/unthread right pants leg Pants- Performed by helper: Pull pants up/down, Thread/unthread left pants leg, Thread/unthread right pants leg   Non-skid slipper socks-  Performed by helper: Don/doff left sock, Don/doff right sock               TED Hose - Performed by helper: Don/doff right TED hose, Don/doff left TED hose  Lower body assist Assist for lower body dressing: Touching or steadying assistance (Pt > 75%)      Toileting Toileting Toileting activity did not occur: No continent bowel/bladder event   Toileting steps completed by helper: Adjust clothing prior to toileting, Performs perineal hygiene, Adjust clothing after toileting Toileting Assistive Devices: Grab bar or rail, Other (comment) (Stedy)  Toileting assist Assist level: Touching or steadying assistance (Pt.75%), More than reasonable time, Two helpers   Transfers Chair/bed transfer Chair/bed transfer activity did not occur: Safety/medical concerns Chair/bed transfer method: Other Chair/bed transfer assist level: 2 helpers Chair/bed transfer assistive device: Mechanical lift Mechanical lift: Landscape architect Ambulation activity did not occur: Safety/medical concerns         Wheelchair   Type: Manual Max wheelchair distance: 150 Assist Level: Dependent (Pt equals 0%)  Cognition Comprehension Comprehension assist level: Understands basic 90% of the time/cues < 10% of the time  Expression Expression assist level: Expresses basic 50 - 74% of the time/requires cueing 25 - 49% of the time. Needs to repeat parts of sentences.  Social Interaction Social Interaction assist level: Interacts appropriately 75 - 89% of the time - Needs redirection for appropriate language or to initiate interaction.  Problem Solving Problem solving assist level: Solves basic 50 - 74% of the time/requires cueing 25 - 49% of the time  Memory Memory assist level: Recognizes or recalls 50 - 74% of the time/requires cueing 25 - 49% of the time    Medical Problem List and Plan: 1.  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  DVT  Prophylaxis/Anticoagulation: Mechanical: Sequential compression devices, below knee Bilateral lower extremities 3. Pain Management: Tylenol prn 4. Mood: LCSW to follow for evaluation and support.  5. Neuropsych: This patient is not fully capable of making decisions on her own behalf. 6. Skin/Wound Care: Ordered air mattress overlay due to malnutrition, poor intake   Stage 2 sacral ulcer, pressure relief, protein supplementation 7. Fluids/Electrolytes/Nutrition:  Continue dextrose for now.   Liquid diet at present, will advance as tolerated 8. HIV/AIDS with MAC?:  On descovy, ticovey  Biaxin and ethambutol d/ced per ID on 8/9.   Follow up per ID. Appreciate recs 9. Candida/CMV esophagitis: Diflucan and On Ganciclovir since 08/03 10. FTT: Continue IVF for now. Started calorie count .   11. Potential for pancytopenia:  Check labs periodically given medications 12. Hyponatremia/Hypokalemia: Monitor lytes with Mg and Phos levels every 3-4 days.   Na+ 134 on 8/14: Will cont to monitor, treat if necessary  K+ 4.6 on 8/14.   Cont to monitor 13. PCP prophylaxis: Bactrim discontinued due to hyponatremia. Has been treated for PCP infection and dapsone resumed 8/4  for prophylaxis.  14. Hypoalbuminemia  Supplement started 8/6 15. Leukocytosis: Resolved 16. Anemia of chronic disease  Hb 9.5 on  8/14  Cont to monitor 17. Hypotension/Tachycardia: Improved this AM  IVF on 8/14  LOS (Days) 11 A FACE TO FACE EVALUATION WAS PERFORMED  Ankit Karis Jubanil Patel 12/06/2015 9:39 AM

## 2015-12-06 NOTE — Progress Notes (Signed)
Speech Language Pathology Daily Session Note  Patient Details  Name: Samantha Kline MRN: [id number] Date of Birth: 1960/07/16  Today's Date: 12/06/2015 SLP Individual Time: [id number] SLP Individual Time Calculation (min): 27 min   Short Term Goals: Week 2: SLP Short Term Goal 1 (Week 2): Patient will initiate verbal expression of basic wants and needs with Min question cues  SLP Short Term Goal 2 (Week 2): Patient will problem solve use of call bell with Min assist verbal and visual cues  SLP Short Term Goal 3 (Week 2): Patient will initiate self-care tasks in under 15 seconds with no more than 2 verbal cues  SLP Short Term Goal 4 (Week 2): Patient will utilize external aids to recall daily information with Min assist cues SLP Short Term Goal 5 (Week 2): Patient will demonstrate sustained attention to familiar tasks for 5 minutes with Mod verbal cues for redirection  SLP Short Term Goal 6 (Week 2): Pt will consume 25% of full liquids diet with mod encouragement  Skilled Therapeutic Interventions:  Pt was seen for skilled ST targeting dysphagia goals.  Pt asleep upon arrival but easily awakened and agreeable to consuming presentations of her currently prescribed diet.  Pt consumed ~25% of her bowl of tomato soup and ~3 sips of thin liquids via straw with supervision.  No overt s/s of aspiration evident despite slight delay (orally) in swallow initiation.  Husband reports improving PO intake since  Initiation of full liquids diet.   Pt became more fatigued as session progressed and pt required increased assistance to maintain alertness. Per RN, pt recently began taking Compazine for nausea which could result in lethargy.   As a result, session was ended early due to pt fatigue.  Pt was left in bed with husband at bedside.  Continue per current plan of care.    Function:  Eating Eating   Modified Consistency Diet: Yes Eating Assist Level: Supervision or verbal cues   Eating Set Up Assist For:  Opening containers Helper Scoops Food on Utensil: Occasionally     Cognition Comprehension Comprehension assist level: Understands basic 90% of the time/cues < 10% of the time  Expression   Expression assist level: Expresses basic 50 - 74% of the time/requires cueing 25 - 49% of the time. Needs to repeat parts of sentences.  Social Interaction Social Interaction assist level: Interacts appropriately 75 - 89% of the time - Needs redirection for appropriate language or to initiate interaction.  Problem Solving Problem solving assist level: Solves basic 25 - 49% of the time - needs direction more than half the time to initiate, plan or complete simple activities  Memory Memory assist level: Recognizes or recalls 50 - 74% of the time/requires cueing 25 - 49% of the time    Pain Pain Assessment Pain Assessment: No/denies pain  Therapy/Group: Individual Therapy  Samantha Kline, Samantha Kline 12/06/2015, 3:49 PM

## 2015-12-06 NOTE — Progress Notes (Signed)
Patient right arm non-pitting edema upon arrival of shift with IV team RN at bedside to hang IVPB, RFA IV flushing, no redness, tenderness.  IV team RN uncomfortable giving IVPB in line, started LFA IV and hung ganciclovir dose.  No further complaints, right arm elevated on pillows HS.    This AM upon foley care assessment, patient brief wet with urine. Site assessed, foley catheter deflated (10 ml present) and reinflated with 10 ml. Not currently leaking. Will report to dayshift to monitor.  No complaints from patient.

## 2015-12-06 NOTE — Progress Notes (Signed)
Occupational Therapy Session Note  Patient Details  Name: Samantha Kline MRN: [id number] Date of Birth: 1960/07/07  Today's Date: 12/06/2015 OT Individual Time: 0902-1001 OT Individual Time Calculation (min): 59 min   Short Term Goals: Week 2:  OT Short Term Goal 1 (Week 2): Pt will complete functional toilet transfer with Max A OT Short Term Goal 2 (Week 2): Pt will complete UB bathing with Min A OT Short Term Goal 3 (Week 2): Pt will complete sit to stand for LB bathing with Max A OT Short Term Goal 4 (Week 2): Pt will engage in 5 minutes of therapeutic activity without rest to increase activity tolerance for self-care tasks  Skilled Therapeutic Interventions/Progress Updates:   OT treatment focused on activity tolerance, functional BSC transfer, toileting/self-care, sit<>stand, and increased ADL participation. Pt transferred to sitting EOB with Mod A, then RN paused IV for UB dressing w/ Max A, and Min-Mod A to maintain sitting balance at EOB. RN re-started Iv, then pt reported need to have BM . Roll-in shower chair/BSC brought beside bed and pt completed stand-pivot transfer with Max A. Pt with + Bm,  able to maintain standing balance with min steady assist while dependent toileting completed. Pt required Max A for LB dressing, but was able to assist with pulling pants up over hips and buttoning pants once seated back in w/c. Pt then completed oral hygiene in w/c at the sink with min set-up assist and Min questioning cues to complete task thoroughly. Pt left seated in w/c at end of session with needs met, quick-release belt secured, and call bell within reach.   Therapy Documentation Precautions:  Precautions Precautions: Fall Restrictions Weight Bearing Restrictions: No Vital Signs: Therapy Vitals Pulse Rate: (!) 119 (seated on BSC after transfer, HR 79 prior to transfer at rest) Oxygen Therapy SpO2: 95 % O2 Device: Not Delivered Pulse Oximetry Type: Intermittent Pain: Pain  Assessment Pain Assessment: No/denies pain ADL: ADL Grooming: Setup, Contact guard Where Assessed-Grooming: Sitting at sink, Wheelchair Upper Body Bathing: Moderate assistance Where Assessed-Upper Body Bathing: Sitting at sink Lower Body Bathing: Maximal assistance Where Assessed-Lower Body Bathing: Standing at sink Upper Body Dressing: Maximal assistance Where Assessed-Upper Body Dressing: Edge of bed Lower Body Dressing: Maximal assistance Where Assessed-Lower Body Dressing: Wheelchair Toileting: Dependent Where Assessed-Toileting: Bedside Commode Toilet Transfer: Maximal assistance Toilet Transfer Method: Stand pivot Toilet Transfer Equipment: Engineer, technical sales Transfer: Not assessed  See Function Navigator for Current Functional Status.   Therapy/Group: Individual Therapy  Valma Cava 12/06/2015, 3:55 PM

## 2015-12-06 NOTE — Progress Notes (Signed)
Nutrition Follow-up  DOCUMENTATION CODES:   Non-severe (moderate) malnutrition in context of acute illness/injury  INTERVENTION:  Continue Ensure Enlive po 4 times daily, each supplement provides 350 kcal and 20 grams of protein.  Provide Magic cup between meals, each supplement provides 290 kcal and 9 grams of protein.  Provide nourishment snacks between meals (Ordered).  Continue Beneprotein (1 scoop) TID at meals, each scoop provides 25 kcal and 6 grams of protein.   Provide 30 ml Prostat po TID, each supplement provides 100 kcal and 15 grams of protein.   Encourage adequate PO intake.   NUTRITION DIAGNOSIS:   Inadequate oral intake related to lethargy/confusion, poor appetite as evidenced by meal completion < 25%; ongoing  GOAL:   Patient will meet greater than or equal to 90% of their needs; progressing  MONITOR:   PO intake, Supplement acceptance, Diet advancement, Labs, Weight trends, Skin, I & O's  REASON FOR ASSESSMENT:   Consult Calorie Count  ASSESSMENT:   55 y/o female who had originally presented on 7/3 with 50 lb weight loss over few months, anorexia, SOB, progressive weakness. Worked up for PCP PNA and diagnosed with AIDS. Went home on antiviral therapy but continued with n/v, poor intake and inability to walk. Readmitted on 7/17 with lethargy, confusion and weakness and severe hyponatremia (110). Developed candida esophagitis. Progressed, but intake has remained poor with refusal of PEG/NGT. Transferred to inpatient rehab on 8/4. RD consulted for calorie count/poor PO intake  Pt is currently on a full liquid diet. Meal completion has been 0-50%. Pt mostly recently has been refusing her Ensure shakes. Pt however has been consuming her Prostat. RD to increase Prostat to aid in caloric and protein needs. RD to continue with the additional nutritional supplements ordered. Pt encouraged to eat her food at meals and to consume her supplements.   Labs and  medications reviewed.   Diet Order:  Diet full liquid Room service appropriate? Yes; Fluid consistency: Thin  Skin:  Wound (see comment) (MASD on peri-rectal area)  Last BM:  8/15  Height:   Ht Readings from Last 1 Encounters:  11/25/15 5\' 4"  (1.626 m)    Weight:   Wt Readings from Last 1 Encounters:  12/05/15 108 lb 3.6 oz (49.1 kg)  Question weight accuracy?  Ideal Body Weight:  54.54 kg  BMI:  Body mass index is 18.58 kg/m.  Estimated Nutritional Needs:   Kcal:  1700-1900 (28-31 kcal/kg bw)  Protein:  84-96 g (1.4-1.6 g/kg bw)  Fluid:  >1.8 liters (30 ml/kg bw)  EDUCATION NEEDS:   No education needs identified at this time  Roslyn SmilingStephanie Eleanor Gatliff, MS, RD, LDN Pager # [phone] After hours/ weekend pager # [phone]

## 2015-12-06 NOTE — Progress Notes (Signed)
Speech Language Pathology Daily Session Note  Patient Details  Name: Samantha Kline MRN: [id number] Date of Birth: April 13, 1961  Today's Date: 12/06/2015 SLP Individual Time: 0830-0900 SLP Individual Time Calculation (min): 30 min   Short Term Goals: Week 2: SLP Short Term Goal 1 (Week 2): Patient will initiate verbal expression of basic wants and needs with Min question cues  SLP Short Term Goal 2 (Week 2): Patient will problem solve use of call bell with Min assist verbal and visual cues  SLP Short Term Goal 3 (Week 2): Patient will initiate self-care tasks in under 15 seconds with no more than 2 verbal cues  SLP Short Term Goal 4 (Week 2): Patient will utilize external aids to recall daily information with Min assist cues SLP Short Term Goal 5 (Week 2): Patient will demonstrate sustained attention to familiar tasks for 5 minutes with Mod verbal cues for redirection  SLP Short Term Goal 6 (Week 2): Pt will consume 25% of full liquids diet with mod encouragement  Skilled Therapeutic Interventions: Skilled treatment session focused on cognitive goals. SLP facilitated session by providing Max A verbal and visual cues for problem solving and recall in regards to utilizing her schedule and calendar to anticipate upcoming therapy sessions and for orientation to date. However, patient independently recalled date after a 10 minute delay. Patient with decreased frustration tolerance throughout tasks. Patient also independently asked the clinician a question about neck pain, RN made aware. Patient left supine in bed with all needs within reach and RN present. Continue with current plan of care.   Function:  Cognition Comprehension Comprehension assist level: Understands basic 90% of the time/cues < 10% of the time  Expression   Expression assist level: Expresses basic 50 - 74% of the time/requires cueing 25 - 49% of the time. Needs to repeat parts of sentences.  Social Interaction Social Interaction  assist level: Interacts appropriately 75 - 89% of the time - Needs redirection for appropriate language or to initiate interaction.  Problem Solving Problem solving assist level: Solves basic 25 - 49% of the time - needs direction more than half the time to initiate, plan or complete simple activities  Memory Memory assist level: Recognizes or recalls 50 - 74% of the time/requires cueing 25 - 49% of the time    Pain Intermittent pain in neck, RN made aware.   Therapy/Group: Individual Therapy  Cloa Bushong 12/06/2015, 2:49 PM

## 2015-12-06 NOTE — Progress Notes (Signed)
Physical Therapy Session Note  Patient Details  Name: Samantha Kline MRN: [id number] Date of Birth: October 30, 1960  Today's Date: 12/06/2015 PT Individual Time: 1100-1153 PT Individual Time Calculation (min): 53 min    Short Term Goals: Week 2:  PT Short Term Goal 1 (Week 2): Pt will maintain static & dynamic sitting without back support consistently with  Mod A. PT Short Term Goal 2 (Week 2): Pt will transfer supine<>sitting with use of hospital bed features & mod A. PT Short Term Goal 3 (Week 2): Pt will perform bed<>chair transfers with consistent Max A +1.  PT Short Term Goal 4 (Week 2): Pt will ambulate 10 ft in parallel bars with Max A.  Skilled Therapeutic Interventions/Progress Updates:    Pt received in w/c & agreeable to PT, denying c/o pain. Pt's vitals at rest:  BP = 131/81 mmHg (1L/min) SpO2 = 100% (1L/min) HR = 79 bpm (room air)  SpO2 = 100% room air after 2 minutes HR = 77 bpm on room air after 2 minutes  Pt on room air for remainder of session & SpO2 = 95-100%. Transported pt to ortho gym via w/c total A. Instructed pt in sit>stand transfer from w/c>stedy lift with pt requiring Max A and pulling up on Stedy lift. Pt able to tolerate standing x 1 minute in [employer] with BUE support and steady A. Pt requested to transfer to sitting due to not feeling well & c/o dizziness. Pt able to sit on seat of Stedy with steady A for balance. HR increased to 135 bpm sitting on seat of Stedy & BP= 103/79 mmHg. Pt continued to not feel well and reported continued dizziness. Transferred pt back to w/c and pt able to complete sit>stand from seat of Stedy with steady A. Reclined pt in TIS w/c and transported pt back to room but en route pt began vomiting and RN notified. Once in room pt transferred back to bed via [employer] Lift + 2 assist and returned to supine +2 assist. At end of session pt left in bed with all needs within reach & RN present.  Notified RN of pt's vitals > or = 95% on room air  throughout session.   Therapy Documentation Precautions:  Precautions Precautions: Fall Restrictions Weight Bearing Restrictions: No  Vital Signs: Therapy Vitals Pulse Rate: 79 (room air) BP: 131/81 Patient Position (if appropriate): Sitting Oxygen Therapy SpO2: 100 % O2 Device: Nasal Cannula O2 Flow Rate (L/min): 1 L/min   See Function Navigator for Current Functional Status.   Therapy/Group: Individual Therapy  Sandi MariscalVictoria M Priscila Bean 12/06/2015, 12:07 PM

## 2015-12-07 ENCOUNTER — Inpatient Hospital Stay (HOSPITAL_COMMUNITY): Payer: Managed Care, Other (non HMO) | Admitting: Physical Therapy

## 2015-12-07 ENCOUNTER — Inpatient Hospital Stay (HOSPITAL_COMMUNITY): Payer: Managed Care, Other (non HMO) | Admitting: Occupational Therapy

## 2015-12-07 ENCOUNTER — Inpatient Hospital Stay (HOSPITAL_COMMUNITY): Payer: Managed Care, Other (non HMO) | Admitting: Speech Pathology

## 2015-12-07 NOTE — Plan of Care (Signed)
Problem: RH BLADDER ELIMINATION Goal: RH STG MANAGE BLADDER WITH ASSISTANCE STG Manage Bladder With mod Assistance  Outcome: Not Progressing Foley

## 2015-12-07 NOTE — Progress Notes (Signed)
Speech Language Pathology Daily Session Note  Patient Details  Name: Samantha Kline MRN: [id number] Date of Birth: Aug 01, 1960  Today's Date: 12/07/2015 SLP Individual Time: 1135-1200 SLP Individual Time Calculation (min): 25 min   Short Term Goals: Week 2: SLP Short Term Goal 1 (Week 2): Patient will initiate verbal expression of basic wants and needs with Min question cues  SLP Short Term Goal 2 (Week 2): Patient will problem solve use of call bell with Min assist verbal and visual cues  SLP Short Term Goal 3 (Week 2): Patient will initiate self-care tasks in under 15 seconds with no more than 2 verbal cues  SLP Short Term Goal 4 (Week 2): Patient will utilize external aids to recall daily information with Min assist cues SLP Short Term Goal 5 (Week 2): Patient will demonstrate sustained attention to familiar tasks for 5 minutes with Mod verbal cues for redirection  SLP Short Term Goal 6 (Week 2): Pt will consume 25% of full liquids diet with mod encouragement  Skilled Therapeutic Interventions:  Pt was seen for skilled ST targeting cognitive goals.  SLP facilitated the session with a previously targeted card game targeting sustained attention to task and task initiation.  Pt with significantly improved task initiation in comparison to previous attempts, no cues needed.  Pt was also able to sustain her attention to task for its duration (15 minutes) with supervision in a moderately distracting environment.  Attempted to get pt to eat but she reported that she was not hungry and had only recently finished breakfast.  Was agreeable to taking several sips of Ensure supplemental beverage and ~1 bite of Jello towards the end of today's therapy session.  Pt was left in wheelchair with quick release belt donned and call bell left within reach.  Continue per current plan of care.    Function:  Eating Eating   Modified Consistency Diet: Yes Eating Assist Level: Set up assist for;Supervision or verbal  cues   Eating Set Up Assist For: Opening containers       Cognition Comprehension Comprehension assist level: Understands complex 90% of the time/cues 10% of the time  Expression   Expression assist level: Expresses basic 50 - 74% of the time/requires cueing 25 - 49% of the time. Needs to repeat parts of sentences.  Social Interaction Social Interaction assist level: Interacts appropriately 75 - 89% of the time - Needs redirection for appropriate language or to initiate interaction.  Problem Solving Problem solving assist level: Solves basic 25 - 49% of the time - needs direction more than half the time to initiate, plan or complete simple activities  Memory Memory assist level: Recognizes or recalls 50 - 74% of the time/requires cueing 25 - 49% of the time    Pain Pain Assessment Pain Assessment: 0-10 Pain Score: 6  Pain Type: Acute pain Pain Location: Abdomen Pain Orientation: Mid Pain Descriptors / Indicators: Aching Patients Stated Pain Goal: 3 Pain Intervention(s): Other (Comment) (RN already aware prior to therapist's arrival )  Therapy/Group: Individual Therapy  Katye Valek, [name] 12/07/2015, 12:28 PM

## 2015-12-07 NOTE — Progress Notes (Signed)
Physical Therapy Session Note  Patient Details  Name: Samantha Kline MRN: [id number] Date of Birth: Dec 21, 1960  Today's Date: 12/07/2015 PT Individual Time: 8185-6314 PT Individual Time Calculation (min): 54 min    Short Term Goals: Week 1:  PT Short Term Goal 1 (Week 1): Pt will maintain static and dynamic sitting balance in midline with mod A PT Short Term Goal 1 - Progress (Week 1): Progressing toward goal PT Short Term Goal 2 (Week 1): Pt will perform bed mobility with max A with pt initiating 25% of transfer sequence PT Short Term Goal 2 - Progress (Week 1): Progressing toward goal PT Short Term Goal 3 (Week 1): Pt will perform bed <> w/c transfers with consistent max A of one person and initiating 25% of transfer PT Short Term Goal 3 - Progress (Week 1): Not met PT Short Term Goal 4 (Week 1): Pt will tolerate 1-2 hours up in reclining w/c to improve core strength and positioning for meals PT Short Term Goal 4 - Progress (Week 1): Met PT Short Term Goal 5 (Week 1): Pt will initiate pre-gait activities in standing with max A PT Short Term Goal 5 - Progress (Week 1): Met Week 2:  PT Short Term Goal 1 (Week 2): Pt will maintain static & dynamic sitting without back support consistently with  Mod A. PT Short Term Goal 2 (Week 2): Pt will transfer supine<>sitting with use of hospital bed features & mod A. PT Short Term Goal 3 (Week 2): Pt will perform bed<>chair transfers with consistent Max A +1.  PT Short Term Goal 4 (Week 2): Pt will ambulate 10 ft in parallel bars with Max A.  Skilled Therapeutic Interventions/Progress Updates:     Patient received sittin in tilt in space WC  And agreeable to PT.   PT assessed orthostatic vitals with TED hose in place. See below for results. Patient required prolonged rest break following sit<>stand, but only requred 1.5-2 minutes for BP and R to return to baseline. NR notified following session of orthostasis and tachycardia.   Sitting therex: hip  flexion, hip extension, ankle PF, hip abduction.  All therex completed x 8 BLE with manual resistance from PT as well as mod-max cues for increased ROM, decreased speed of eccentric movement and prolonged therapeutic rest break after each exercise. Patient noted to have elevated HR and nausea after each bout of exertion with HR reaching to 138bpm. <1 minute for HR to return to 120pm.  Patient left sitting in [facility] with call bell in reach and all needs met.    Therapy Documentation Precautions:  Precautions Precautions: Fall Restrictions Weight Bearing Restrictions: No General:   Vital Signs: Therapy Vitals Pulse Rate: (!) 111 BP: 111/70 Patient Position (if appropriate): Orthostatic Vitals Oxygen Therapy SpO2: 100 %  Sitting at 3 minutes: BP109//85; HR 121bpm Standing at 0 minutes: 77/61; HR 156bpm Sitting: BP 120/77; HR 120bpm  Pain: 0/10 Pain Assessment Pain Assessment: 0-10 Pain Score: 4 Pain Type: Acute pain Pain Location: Abdomen Pain Orientation: Mid Pain Descriptors / Indicators: Aching Patients Stated Pain Goal: 2    See Function Navigator for Current Functional Status.   Therapy/Group: Individual Therapy  Lorie Phenix 12/07/2015, 12:58 PM

## 2015-12-07 NOTE — Progress Notes (Signed)
Occupational Therapy Session Note  Patient Details  Name: Samantha Kline MRN: [id number] Date of Birth: 1961/04/07  Today's Date: 12/07/2015   Session 1: OT Individual Time: 08:48-09:31 OT Individual Time Calculation (min): 43 min  Session 2: OT Individual Time: 1433-1500 OT Individual Time Calculation (min): 27 min   Short Term Goals: Week 1:  OT Short Term Goal 1 (Week 1): Pt will complete functional toilet transfer with Max A OT Short Term Goal 1 - Progress (Week 1): Progressing toward goal OT Short Term Goal 2 (Week 1): Pt will complete UB bathing with Min A OT Short Term Goal 2 - Progress (Week 1): Progressing toward goal OT Short Term Goal 3 (Week 1): Pt will complete sit to stand for LB bathing with Max A OT Short Term Goal 3 - Progress (Week 1): Progressing toward goal OT Short Term Goal 4 (Week 1): Pt will engage in 5 minutes of therapeutic activity without rest to increase activity tolerance for self care completion  OT Short Term Goal 4 - Progress (Week 1): Progressing toward goal Week 2:  OT Short Term Goal 1 (Week 2): Pt will complete functional toilet transfer with Max A OT Short Term Goal 2 (Week 2): Pt will complete UB bathing with Min A OT Short Term Goal 3 (Week 2): Pt will complete sit to stand for LB bathing with Max A OT Short Term Goal 4 (Week 2): Pt will engage in 5 minutes of therapeutic activity without rest to increase activity tolerance for self-care tasks  Skilled Therapeutic Interventions/Progress Updates:     Session 1: OT session focused on activity tolerance and ADL participation. Pt transferred to sitting EOB w/ Mod A. Pt required Max A for LB dressing in sitting, HR 154 and tachy sitting EOB, reporting dizziness and nausea. Pt returned to bed and pt status discussed with nurse. Symptoms resolved after rest break, then pt completed stand-pivot transfer from bed >w/c with Max A. Pt tolerated standing one additional time to pull up and fasten pants. Pt  reported max fatigue and was left seated in w/c with needs met and quick release belt in place.  Session 2: OT session focused on self-care/grooming tasks and activity tolerance. Pt propelled w/c w/ mod A to the sink where she completed in grooming tasks. With min A, progressing to min verbal cues, pt was able to reach and adjust water temperature to perform 3 grooming tasks without rest break (approx 7 mins of activity). Pt with onset of "cramping" abdominal pain and request to return to bed. Pt completed stand-pivot transfer back to bed with Max A. Pt left in side-lying in bed with needs met and relief from pain.   Therapy Documentation Precautions:  Precautions Precautions: Fall Restrictions Weight Bearing Restrictions: No Vital Signs: Therapy Vitals Pulse Rate: (!) 152 (sitting EOB after LB dressing) Oxygen Therapy SpO2: 98 % Pain: Pain Assessment Pain Assessment: Faces Pain Score: 6  Faces Pain Scale: Hurts even more Pain Type: Acute pain Pain Location: Abdomen Pain Orientation: Mid Pain Descriptors / Indicators: Cramping;Aching Patients Stated Pain Goal: 3 Pain Intervention(s): Repositioned ADL:   See Function Navigator for Current Functional Status.   Therapy/Group: Individual Therapy  Valma Cava 12/07/2015, 3:18 PM

## 2015-12-07 NOTE — Progress Notes (Addendum)
[place] PHYSICAL MEDICINE & REHABILITATION     PROGRESS NOTE  Subjective/Complaints:  Pt laying in bed.  She again, states she slept well.  She is less confused and agitated this AM.    ROS: +Fatigued. Denies CP, SOB, N/V/D.  Objective: Vital Signs: Blood pressure 109/87, pulse 97, temperature 98.4 F (36.9 C), temperature source Oral, resp. rate 18, height 5\' 4"  (1.626 m), weight 49.1 kg (108 lb 3.6 oz), SpO2 97 %. No results found.  Recent Labs  12/05/15 0505  WBC 5.6  HGB 9.5*  HCT 29.9*  PLT 289    Recent Labs  12/05/15 0505  NA 134*  K 4.6  CL 101  GLUCOSE 95  BUN 13  CREATININE 0.66  CALCIUM 8.1*   CBG (last 3)  No results for input(s): GLUCAP in the last 72 hours.  Wt Readings from Last 3 Encounters:  12/05/15 49.1 kg (108 lb 3.6 oz)  11/23/15 60.6 kg (133 lb 11.2 oz)  10/31/15 58.6 kg (129 lb 3 oz)    Physical Exam:  BP 109/87 (BP Location: Right Arm)   Pulse 97   Temp 98.4 F (36.9 C) (Oral)   Resp 18   Ht 5\' 4"  (1.626 m)   Wt 49.1 kg (108 lb 3.6 oz)   SpO2 97%   BMI 18.58 kg/m  Constitutional: Vital signs are normal. NAD. She appears cachectic. She has a sickly appearance.  HENT: Normocephalic and atraumatic.  Mouth/Throat: Oral lesions present.  Eyes: Conjunctivae and EOM are normal.  Cardiovascular: Regular rate and rhythm. No murmur heard. Respiratory: +[place]. Effort normal and breath sounds normal. No stridor. No respiratory distress. She has no wheezes.  GI: Soft. Bowel sounds are normal. She exhibits no distension. There is no tenderness.  Musculoskeletal: She exhibits no edema or tenderness.  Muscle wasting all limbs.  Neurological:  A&O X3. Motor:  B/l UE: 4/5 deltoid, biceps, triceps, 4/5 wrist and HI.  B/l LE: 4-/5 HF, 4/5 KE/KF and 4/5 ADF/PF (poor tolerance - improving).  Skin: Skin is warm and dry. No rash noted. No erythema.  6 small moisture associated ulcers sacrum area (not examined today). Psychiatric: Her affect is  blunt. She is slowed. Cognition and memory are impaired.   Assessment/Plan: 1. 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 Physiatrist is providing close team supervision and 24 hour management of active medical problems listed below. Physiatrist and rehab team continue to assess barriers to discharge/monitor patient progress toward functional and medical goals.  Function:  Bathing Bathing position   Position: Bed  Bathing parts Body parts bathed by patient: Chest, Left arm, Right arm, Abdomen, Right upper leg, Left upper leg Body parts bathed by helper: Front perineal area, Buttocks, Left lower leg, Right lower leg  Bathing assist Assist Level: Touching or steadying assistance(Pt > 75%), Set up   Set up : To obtain items  Upper Body Dressing/Undressing Upper body dressing   What is the patient wearing?: Pull over shirt/dress     Pull over shirt/dress - Perfomed by patient: Pull shirt over trunk Pull over shirt/dress - Perfomed by helper: Thread/unthread right sleeve, Thread/unthread left sleeve, Put head through opening Button up shirt - Perfomed by patient: Thread/unthread left sleeve Button up shirt - Perfomed by helper: Thread/unthread right sleeve, Pull shirt around back, Button/unbutton shirt    Upper body assist Assist Level: Touching or steadying assistance(Pt > 75%) (maxA)   Set  up : To obtain clothing/put away  Lower Body Dressing/Undressing Lower body dressing   What is the patient wearing?: Non-skid slipper socks, Pants, Ted Hose   Underwear - Performed by helper: Thread/unthread right underwear leg, Thread/unthread left underwear leg, Pull underwear up/down Pants- Performed by patient: Pull pants up/down, Fasten/unfasten pants Pants- Performed by helper: Thread/unthread right pants leg, Thread/unthread left pants leg   Non-skid slipper socks- Performed by  helper: Don/doff right sock, Don/doff left sock               TED Hose - Performed by helper: Don/doff right TED hose, Don/doff left TED hose  Lower body assist Assist for lower body dressing: Touching or steadying assistance (Pt > 75%)      Toileting Toileting Toileting activity did not occur: No continent bowel/bladder event   Toileting steps completed by helper: Adjust clothing prior to toileting, Performs perineal hygiene, Adjust clothing after toileting Toileting Assistive Devices: Grab bar or rail  Toileting assist Assist level: Two helpers   Transfers Chair/bed transfer Chair/bed transfer activity did not occur: Safety/medical concerns Chair/bed transfer method: Other Chair/bed transfer assist level: 2 helpers Chair/bed transfer assistive device: Mechanical lift Mechanical lift: Landscape architect Ambulation activity did not occur: Safety/medical concerns         Wheelchair   Type: Manual Max wheelchair distance: 150 Assist Level: Dependent (Pt equals 0%)  Cognition Comprehension Comprehension assist level: Understands complex 90% of the time/cues 10% of the time  Expression Expression assist level: Expresses basic 50 - 74% of the time/requires cueing 25 - 49% of the time. Needs to repeat parts of sentences.  Social Interaction Social Interaction assist level: Interacts appropriately 75 - 89% of the time - Needs redirection for appropriate language or to initiate interaction.  Problem Solving Problem solving assist level: Solves basic 25 - 49% of the time - needs direction more than half the time to initiate, plan or complete simple activities  Memory Memory assist level: Recognizes or recalls 50 - 74% of the time/requires cueing 25 - 49% of the time    Medical Problem List and Plan: 1.  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  DVT Prophylaxis/Anticoagulation: Mechanical: Sequential compression  devices, below knee Bilateral lower extremities 3. Pain Management: Tylenol prn 4. Mood: LCSW to follow for evaluation and support.  5. Neuropsych: This patient is not fully capable of making decisions on her own behalf. 6. Skin/Wound Care: Ordered air mattress overlay due to malnutrition, poor intake   Moisture associated sacral ulcer, pressure relief, keep dry, protein supplementation 7. Fluids/Electrolytes/Nutrition:  Continue dextrose for now.   Liquid diet at present, will advance as tolerated 8. HIV/AIDS with MAC?:  On descovy, ticovey  Biaxin and ethambutol d/ced per ID on 8/9.   Follow up per ID. Appreciate recs 9. Candida/CMV esophagitis: Diflucan and On Ganciclovir since 08/03 10. FTT: Continue IVF for now. Started calorie count .   11. Potential for pancytopenia:  Check labs periodically given medications 12. Hyponatremia/Hypokalemia: Monitor lytes with Mg and Phos levels every 3-4 days.   Na+ 134 on 8/14: Will cont to monitor, treat if necessary  K+ 4.6 on 8/14.   Cont to monitor 13. PCP prophylaxis: Bactrim discontinued due to hyponatremia. Has been treated for PCP infection and dapsone resumed 8/4  for prophylaxis.  14. Hypoalbuminemia  Supplement started 8/6 15. Leukocytosis: Resolved 16. Anemia of chronic disease  Hb 9.5 on 8/14  Cont to  monitor 17. Hypotension/Tachycardia: Remains WNL  IVF on 8/14  LOS (Days) 12 A FACE TO FACE EVALUATION WAS PERFORMED  Ankit Karis Jubanil Patel 12/07/2015 8:52 AM

## 2015-12-07 NOTE — Progress Notes (Signed)
Continues to have orthostatic changes.  Will discontinued elavil. Will have staff work on [employer]--sitting program during the day to help build endurance and activity tolerance.

## 2015-12-08 ENCOUNTER — Inpatient Hospital Stay (HOSPITAL_COMMUNITY): Payer: Managed Care, Other (non HMO) | Admitting: Occupational Therapy

## 2015-12-08 ENCOUNTER — Inpatient Hospital Stay (HOSPITAL_COMMUNITY): Payer: Managed Care, Other (non HMO) | Admitting: Physical Therapy

## 2015-12-08 ENCOUNTER — Inpatient Hospital Stay (HOSPITAL_COMMUNITY): Payer: Managed Care, Other (non HMO) | Admitting: Speech Pathology

## 2015-12-08 DIAGNOSIS — D72819 Decreased white blood cell count, unspecified: Secondary | ICD-10-CM

## 2015-12-08 DIAGNOSIS — I951 Orthostatic hypotension: Secondary | ICD-10-CM | POA: Insufficient documentation

## 2015-12-08 LAB — CBC WITH DIFFERENTIAL/PLATELET
Basophils Absolute: 0.1 10*3/uL (ref 0.0–0.1)
Basophils Relative: 2 %
Eosinophils Absolute: 0 10*3/uL (ref 0.0–0.7)
Eosinophils Relative: 1 %
HCT: 26.2 % — ABNORMAL LOW (ref 36.0–46.0)
Hemoglobin: 8.3 g/dL — ABNORMAL LOW (ref 12.0–15.0)
Lymphocytes Relative: 37 %
Lymphs Abs: 1.2 10*3/uL (ref 0.7–4.0)
MCH: 31.1 pg (ref 26.0–34.0)
MCHC: 31.7 g/dL (ref 30.0–36.0)
MCV: 98.1 fL (ref 78.0–100.0)
Monocytes Absolute: 0.3 10*3/uL (ref 0.1–1.0)
Monocytes Relative: 8 %
Neutro Abs: 1.6 10*3/uL — ABNORMAL LOW (ref 1.7–7.7)
Neutrophils Relative %: 52 %
Platelets: 287 10*3/uL (ref 150–400)
RBC: 2.67 MIL/uL — ABNORMAL LOW (ref 3.87–5.11)
RDW: 17.1 % — ABNORMAL HIGH (ref 11.5–15.5)
WBC: 3.1 10*3/uL — ABNORMAL LOW (ref 4.0–10.5)

## 2015-12-08 LAB — COMPREHENSIVE METABOLIC PANEL
ALT: 17 U/L (ref 14–54)
AST: 27 U/L (ref 15–41)
Albumin: 1.2 g/dL — ABNORMAL LOW (ref 3.5–5.0)
Alkaline Phosphatase: 75 U/L (ref 38–126)
Anion gap: 3 — ABNORMAL LOW (ref 5–15)
BUN: 10 mg/dL (ref 6–20)
CO2: 25 mmol/L (ref 22–32)
Calcium: 7.6 mg/dL — ABNORMAL LOW (ref 8.9–10.3)
Chloride: 108 mmol/L (ref 101–111)
Creatinine, Ser: 0.52 mg/dL (ref 0.44–1.00)
GFR calc Af Amer: >60 mL/min (ref >60)
GFR calc non Af Amer: >60 mL/min (ref >60)
Glucose, Bld: 70 mg/dL (ref 65–99)
Potassium: 4.3 mmol/L (ref 3.5–5.1)
Sodium: 136 mmol/L (ref 135–145)
Total Bilirubin: 0.3 mg/dL (ref 0.3–1.2)
Total Protein: 5.8 g/dL — ABNORMAL LOW (ref 6.5–8.1)

## 2015-12-08 MED ORDER — SODIUM CHLORIDE 0.9 % IV SOLN
INTRAVENOUS | Status: DC
  Administered 2015-12-08 – 2015-12-22 (×14): via INTRAVENOUS
  Filled 2015-12-08: qty 1000

## 2015-12-08 MED ORDER — MUSCLE RUB 10-15 % EX CREA
TOPICAL_CREAM | Freq: Two times a day (BID) | CUTANEOUS | Status: DC
  Administered 2015-12-08 – 2015-12-11 (×5): via TOPICAL
  Administered 2015-12-11: 1 via TOPICAL
  Administered 2015-12-12 – 2015-12-23 (×16): via TOPICAL
  Filled 2015-12-08: qty 85

## 2015-12-08 MED ORDER — LIDOCAINE HCL 2 % EX GEL: CUTANEOUS | Status: DC | PRN

## 2015-12-08 MED ORDER — TBO-FILGRASTIM 300 MCG/0.5ML ~~LOC~~ SOSY
300.0000 ug | PREFILLED_SYRINGE | Freq: Once | SUBCUTANEOUS | Status: AC
  Administered 2015-12-08: 300 ug via SUBCUTANEOUS
  Filled 2015-12-08: qty 0.5

## 2015-12-08 NOTE — Progress Notes (Signed)
Daily Progress Note   Patient Name: Samantha Kline       Date: 12/08/2015 DOB: Apr 27, 1960  Age: 55 y.o. MRN#: [id number] Attending Physician: Samantha FennelAnkit Anil Patel, MD Primary Care Physician: No PCP Per Patient Admit Date: 11/25/2015  Reason for Consultation/Follow-up: Establishing goals of care  Subjective: Patient requesting cheerios and milk.  States she is trying to eat more.  PMT following up on Advanced Directives Paperwork.  Left voicemail for Samantha Kline to attempt the get living will completed and notarized.  Length of Stay: 13  Current Medications: Scheduled Meds:  . antiseptic oral rinse  7 mL Mouth Rinse BID  . dapsone  100 mg Oral Daily  . dimethicone   Topical TID PC  . dolutegravir  50 mg Oral Q1200  . dronabinol  5 mg Oral BID AC  . emtricitabine-tenofovir AF  1 tablet Oral Q1200  . feeding supplement (ENSURE ENLIVE)  237 mL Oral QID  . feeding supplement (PRO-STAT SUGAR FREE 64)  30 mL Oral TID BM  . ferrous sulfate  325 mg Oral Q breakfast  . fluconazole  200 mg Oral Daily  . ganciclovir (CYTOVENE) IV  2.5 mg/kg Intravenous Q12H  . Gerhardt's butt cream   Topical BID  . multivitamin with minerals  1 tablet Oral Daily  . MUSCLE RUB   Topical BID  . potassium chloride  20 mEq Oral BID  . sodium chloride 0.9 % 1,000 mL infusion   Intravenous Daily  . Tbo-filgastrim (GRANIX) SQ  300 mcg Subcutaneous ONCE-1800  . vitamin B-12  100 mcg Oral Daily  . Vitamin D (Ergocalciferol)  50,000 Units Oral Q Fri    Continuous Infusions:    PRN Meds: acetaminophen, albuterol, alum & mag hydroxide-simeth, bisacodyl, dextromethorphan-guaiFENesin, diphenhydrAMINE, lidocaine, ondansetron, prochlorperazine **OR** prochlorperazine **OR** prochlorperazine, senna-docusate, sodium phosphate,  traZODone  Physical Exam    Thin, frail female sitting in wheel chair eating jello.  Soft voice. CV rrr Resp nad Abdomen soft thin Ext able to move all 4, no edema.  Vital Signs: BP 115/75 (BP Location: Right Arm)   Pulse (!) 149 Comment: s/p stand-pivot ransfer  Temp 98.4 F (36.9 C) (Oral)   Resp 16   Ht 5\' 4"  (1.626 m)   Wt 49.1 kg (108 lb 3.6 oz)   SpO2 99%   BMI 18.58 kg/m  SpO2: SpO2: 99 % O2 Device: O2 Device: Not Delivered O2 Flow Rate: O2 Flow Rate (L/min): 1 L/min  Intake/output summary:   Intake/Output Summary (Last 24 hours) at 12/08/15 1602 Last data filed at 12/08/15 1300  Gross per 24 hour  Intake             1705 ml  Output             3500 ml  Net            -1795 ml   LBM: Last BM Date: 12/08/15 Baseline Weight: Weight: 60.3 kg (133 lb) Most recent weight: Weight: 49.1 kg (108 lb 3.6 oz)       Palliative Assessment/Data:    Flowsheet Rows   Flowsheet Row Most Recent Value  Intake Tab  Referral Department  -- [CIR]  Unit at Time of Referral  Other (Comment)  Palliative Care Primary Diagnosis  Neurology  Date Notified  11/29/15  Palliative Care Type  New Palliative care  Reason for referral  Clarify Goals of Care  Date of Admission  11/25/15  Date first seen by Palliative Care  11/29/15  # of days Palliative referral response time  0 Day(s)  # of days IP prior to Palliative referral  4  Clinical Assessment  Palliative Performance Scale Score  40%  Psychosocial & Spiritual Assessment  Palliative Care Outcomes  Patient/Family meeting held?  Yes  Who was at the meeting?  patient      Patient Active Problem List   Diagnosis Date Noted  . Orthostasis   . Leukopenia   . Agitation   . Hypotension   . Anorexia   . Stage 2 skin ulcer of sacral region   . Mycobacterium avium complex (HCC)   . Thrush   . Palliative care encounter   . Goals of care, counseling/discussion   . Leukocytosis   . HIV encephalopathy (HCC)   . Benign essential  HTN   . Dysphagia   . Antineoplastic chemotherapy induced pancytopenia (HCC)   . Drug-induced folate deficiency anemia   . Physical debility 11/25/2015  . Esophageal ulcer   . Malnutrition of moderate degree 11/22/2015  . Anemia due to bone marrow failure (HCC)   . Cytomegalovirus (CMV) viremia (HCC)   . Esophagitis, CMV   . FTT (failure to thrive) in adult   . Physical deconditioning   . AIDS (HCC)   . Candida esophagitis (HCC)   . FUO (fever of unknown origin)   . Arterial hypotension   . Tachypnea   . Tachycardia   . Anemia of chronic disease   . Hyperkalemia 11/07/2015  . Nausea without vomiting 11/07/2015  . Pressure ulcer 11/07/2015  . Elevated lactic acid level 11/07/2015  . Sepsis (HCC)   . Acute encephalopathy 11/06/2015  . Hyponatremia 11/06/2015  . Bilateral pneumonia   . Protein-calorie malnutrition, severe (HCC) 10/28/2015  . CAP (community acquired pneumonia) 10/27/2015  . Human immunodeficiency virus (HIV) disease (HCC)   . Acute hypoxemic respiratory failure (HCC) 10/25/2015  . Hypokalemia   . 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 Anemia 10/24/2015  . Weight loss     Palliative Care Assessment & Plan   Patient Profile: 55 y.o. female  with past medical history of anemia, 50+ lb weight loss and recently diagnosed AIDS infection.  She was admitted on 11/25/2015 for rehabilitation after being discharged from a hospital stay for hyponatremia, fever of unknown origin (PCP infection vs  IRIS), severe protein calorie malnutrition and severe dysphagia secondary to ulcerations from possible CMV vs HSV esophagitis.  Albumin of 1.3. She currently has poor functional status and is too weak to walk.  She is refusing a PEG, but is attempting to consume protein drinks.  She is making slow improvement.  Assessment: Extremely weak 55 yo female with AIDS who started ART therapy and dropped both her viral load and her CD4 count  dramatically (IRIS?).  Also diagnosed with ulcerative esophagitis.  Path indicates possible CMV.  Patient very slowly improving.  Family hopeful for recovery.  Recommendations/Plan:  Full Scope Treatment  Will call chaplain to notarize HCPOA and Living Will    PMT will continue to follow intermittently and maintain a relationship with Mrs. Samantha Kline  Out Patient Palliative Follow up is recommended at discharge.    Code Status:  Full Code  Prognosis:  Unable to determine.  While she is extremely weak and frail - it is anticipated that this is temporary and she has an opportunity to gain strength back.  Discharge Planning:  To Be Determined.  Palliative outpatient follow up recommended at discharge for continued support.  Care plan was discussed with the patient.  Thank you for allowing the Palliative Medicine Team to assist in the care of this patient.   Time In: 12:30 Time Out: 12:45 Total Time 15 Prolonged Time Billed no      Greater than 50%  of this time was spent counseling and coordinating care related to the above assessment and plan.  Algis DownsMarianne York, PA-C Palliative Medicine Pager: [phone]  Please contact Palliative Medicine Team phone at [phone] for questions and concerns.

## 2015-12-08 NOTE — Plan of Care (Signed)
Problem: RH BLADDER ELIMINATION Goal: RH STG MANAGE BLADDER WITH ASSISTANCE STG Manage Bladder With mod Assistance  Outcome: Not Progressing Total assist- foley

## 2015-12-08 NOTE — Progress Notes (Signed)
Social Work Patient ID: Samantha Kline, female   DOB: Jul 25, 1960, 55 y.o.   MRN: [id number]   Met with pt and spouse yesterday following team conference.  They are aware and agreeable with targeted d/c date of 9/5 and mod assist goals overall.  Reviewed concerns of po intake as well as BP/HR issues which they are aware of.  Becky Dupree, LCSW to resume case upon her return tomorrow.  Lilias Lorensen, LCSW

## 2015-12-08 NOTE — Progress Notes (Signed)
[place] PH[facility]ICAL [facility]ICINE & REHABILITATION     PROGRESS NOTE  Subjective/Complaints:  Pt laying in bed.  She states she is doing well and does not require anything.   ROS: +Fatigued. Denies CP, SOB, N/V/D.  Objective: Vital Signs: Blood pressure 115/75, pulse (!) 152, temperature 98.4 F (36.9 C), temperature source Oral, resp. rate 16, height 5\' 4"  (1.626 m), weight 49.1 kg (108 lb 3.6 oz), SpO2 99 %. No results found.  Recent Labs  12/08/15 0516  WBC 3.1*  HGB 8.3*  HCT 26.2*  PLT 287    Recent Labs  12/08/15 0516  NA 136  K 4.3  CL 108  GLUCOSE 70  BUN 10  CREATININE 0.52  CALCIUM 7.6*   CBG (last 3)  No results for input(s): GLUCAP in the last 72 hours.  Wt Readings from Last 3 Encounters:  12/05/15 49.1 kg (108 lb 3.6 oz)  11/23/15 60.6 kg (133 lb 11.2 oz)  10/31/15 58.6 kg (129 lb 3 oz)    Physical Exam:  BP 115/75 (BP Location: Right Arm)   Pulse (!) 152 Comment: sitting EOB after LB dressing  Temp 98.4 F (36.9 C) (Oral)   Resp 16   Ht 5\' 4"  (1.626 m)   Wt 49.1 kg (108 lb 3.6 oz)   SpO2 99%   BMI 18.58 kg/m  Constitutional: Vital signs are normal. NAD. She appears cachectic. She has a sickly appearance.  HENT: Normocephalic and atraumatic.  Mouth/Throat: Oral lesions present.  Eyes: Conjunctivae and EOM are normal.  Cardiovascular: Regular rate and rhythm. No murmur heard. Respiratory: +[place]. Effort normal and breath sounds normal. No stridor. No respiratory distress. She has no wheezes.  GI: Soft. Bowel sounds are normal. She exhibits no distension. There is no tenderness.  Musculoskeletal: She exhibits no edema or tenderness.  Muscle wasting all limbs.  Neurological:  A&O X3. Motor:  B/l UE: 4/5 deltoid, biceps, triceps, 4/5 wrist and HI.  B/l LE: 4/5 HF, 4+/5 KE/KF and 4+/5 ADF/PF.  Skin: Skin is warm and dry. No rash noted. No erythema.  6 small moisture associated ulcers sacrum area (not examined today). Psychiatric: Her affect is  blunt. She is slowed. Cognition and memory are impaired.   Assessment/Plan: 1. 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 Physiatrist is providing close team supervision and 24 hour management of active medical problems listed below. Physiatrist and rehab team continue to assess barriers to discharge/monitor patient progress toward functional and medical goals.  Function:  Bathing Bathing position   Position: Bed  Bathing parts Body parts bathed by patient: Chest, Left arm, Right arm, Abdomen, Right upper leg, Left upper leg Body parts bathed by helper: Front perineal area, Buttocks, Left lower leg, Right lower leg  Bathing assist Assist Level: Touching or steadying assistance(Pt > 75%), Set up   Set up : To obtain items  Upper Body Dressing/Undressing Upper body dressing   What is the patient wearing?: Pull over shirt/dress     Pull over shirt/dress - Perfomed by patient: Pull shirt over trunk Pull over shirt/dress - Perfomed by helper: Thread/unthread right sleeve, Thread/unthread left sleeve, Put head through opening Button up shirt - Perfomed by patient: Thread/unthread left sleeve Button up shirt - Perfomed by helper: Thread/unthread right sleeve, Pull shirt around back, Button/unbutton shirt    Upper body assist Assist Level: Touching or steadying assistance(Pt > 75%) (maxA)   Set up :  To obtain clothing/put away  Lower Body Dressing/Undressing Lower body dressing   What is the patient wearing?: Non-skid slipper socks, Pants   Underwear - Performed by helper: Thread/unthread right underwear leg, Thread/unthread left underwear leg, Pull underwear up/down Pants- Performed by patient: Fasten/unfasten pants Pants- Performed by helper: Thread/unthread right pants leg, Thread/unthread left pants leg, Pull pants up/down   Non-skid slipper socks- Performed by helper:  Don/doff left sock, Don/doff right sock               TED Hose - Performed by helper: Don/doff right TED hose, Don/doff left TED hose  Lower body assist Assist for lower body dressing: Touching or steadying assistance (Pt > 75%)      Toileting Toileting Toileting activity did not occur: No continent bowel/bladder event   Toileting steps completed by helper: Adjust clothing prior to toileting, Performs perineal hygiene, Adjust clothing after toileting Toileting Assistive Devices: Grab bar or rail  Toileting assist Assist level: Two helpers   Transfers Chair/bed transfer Chair/bed transfer activity did not occur: Safety/medical concerns Chair/bed transfer method: Other Chair/bed transfer assist level: 2 helpers Chair/bed transfer assistive device: Systems developer lift: Landscape architect Ambulation activity did not occur: Safety/medical concerns         Wheelchair   Type: Manual Max wheelchair distance: 150 Assist Level: Dependent (Pt equals 0%)  Cognition Comprehension Comprehension assist level: Understands basic 90% of the time/cues < 10% of the time  Expression Expression assist level: Expresses basic 50 - 74% of the time/requires cueing 25 - 49% of the time. Needs to repeat parts of sentences.  Social Interaction Social Interaction assist level: Interacts appropriately 75 - 89% of the time - Needs redirection for appropriate language or to initiate interaction.  Problem Solving Problem solving assist level: Solves basic 25 - 49% of the time - needs direction more than half the time to initiate, plan or complete simple activities  Memory Memory assist level: Recognizes or recalls 50 - 74% of the time/requires cueing 25 - 49% of the time    Medical Problem List and Plan: 1.  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  DVT Prophylaxis/Anticoagulation: Mechanical: Sequential compression devices,  below knee Bilateral lower extremities 3. Pain Management: Tylenol prn 4. Mood: LCSW to follow for evaluation and support.  5. Neuropsych: This patient is not fully capable of making decisions on her own behalf. 6. Skin/Wound Care: Ordered air mattress overlay due to malnutrition, poor intake   Moisture associated sacral ulcer, pressure relief, keep dry, protein supplementation 7. Fluids/Electrolytes/Nutrition:  Continue dextrose for now.   Liquid diet at present, will advance as tolerated 8. HIV/AIDS with MAC?:  On descovy, ticovey  Biaxin and ethambutol d/ced per ID on 8/9.   Follow up per ID. Appreciate recs 9. Candida/CMV esophagitis: Diflucan and On Ganciclovir since 08/03 10. FTT: Continue IVF for now. Started calorie count .   11. Potential for pancytopenia:  Check labs periodically given medications 12. Hyponatremia/Hypokalemia: Monitor lytes with Mg and Phos levels every 3-4 days.   Na+ 136 on 8/17: Will cont to monitor, treat if necessary  K+ 4.3 on 8/17.   Cont to monitor 13. PCP prophylaxis: Bactrim discontinued due to hyponatremia. Has been treated for PCP infection and dapsone resumed 8/4  for prophylaxis.  14. Hypoalbuminemia  Supplement started 8/6 15. Leukocytosis: Resolved 16. Anemia of chronic disease  Hb 8.3 on 8/17  Cont to monitor 17. Orthostasis  TEDS, abdominal binder  IVF 18. Leukopenia  Will ask ID for recs  LOS (Days) 13 A FACE TO FACE EVALUATION WAS PERFORMED  Shaquanna Lycan Karis Jubanil Laneya Gasaway 12/08/2015 9:38 AM

## 2015-12-08 NOTE — Patient Care Conference (Signed)
Inpatient RehabilitationTeam Conference and Plan of Care Update Date: 12/08/2015   Time: 11:26 AM    Patient Name: Samantha Kline      Medical Record Number: [id number]  Date of Birth: Sep 13, 1960 Sex: Female         Room/Bed: 4W24C/4W24C-01 Payor Info: Payor: C[employer] / Plan: CIGNA MANAGED / Product Type: *No Product type* /    Admitting Diagnosis: Debility  Admit Date/Time:  11/25/2015  5:22 PM Admission Comments: No comment available   Primary Diagnosis:  Physical debility Principal Problem: Physical debility  Patient Active Problem List   Diagnosis Date Noted  . Orthostasis   . Leukopenia   . Agitation   . Hypotension   . Anorexia   . Stage 2 skin ulcer of sacral region   . Mycobacterium avium complex (HCC)   . Thrush   . Palliative care encounter   . Goals of care, counseling/discussion   . Leukocytosis   . HIV encephalopathy (HCC)   . Benign essential HTN   . Dysphagia   . Antineoplastic chemotherapy induced pancytopenia (HCC)   . Drug-induced folate deficiency anemia   . Physical debility 11/25/2015  . Esophageal ulcer   . Malnutrition of moderate degree 11/22/2015  . Anemia due to bone marrow failure (HCC)   . Cytomegalovirus (CMV) viremia (HCC)   . Esophagitis, CMV   . FTT (failure to thrive) in adult   . Physical deconditioning   . AIDS (HCC)   . Candida esophagitis (HCC)   . FUO (fever of unknown origin)   . Arterial hypotension   . Tachypnea   . Tachycardia   . Anemia of chronic disease   . Hyperkalemia 11/07/2015  . Nausea without vomiting 11/07/2015  . Pressure ulcer 11/07/2015  . Elevated lactic acid level 11/07/2015  . Sepsis (HCC)   . Acute encephalopathy 11/06/2015  . Hyponatremia 11/06/2015  . Bilateral pneumonia   . Protein-calorie malnutrition, severe (HCC) 10/28/2015  . CAP (community acquired pneumonia) 10/27/2015  . Human immunodeficiency virus (HIV) disease (HCC)   . Acute hypoxemic respiratory failure (HCC) 10/25/2015  . Hypokalemia   .  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 Anemia 10/24/2015  . Weight loss     Expected Discharge Date: Expected Discharge Date: 12/27/15  Team Members Present: Physician leading conference: Dr. Maryla MorrowAnkit Patel Social Worker Present: Amada JupiterLucy Sharine Cadle, LCSW Nurse Present: Ronny BaconWhitney Reardon, RN PT Present: Grier RocherAustin Tucker, PT OT Present: Other (comment) Gentry Fitz(Elisabeth Doe, OT) SLP Present: Feliberto Gottronourtney Payne, SLP PPS Coordinator present : Tora DuckMarie Noel, RN, CRRN     Current Status/Progress Goal Weekly Team Focus  Medical   Function, mobility and cognitive deficits secondary to HIV encephalopathy  Improve endurance, safety, cognition, electrolytes, blood pressure  See above   Bowel/Bladder   Incontinent of bowel and badder LBM 12/06/15 with foley catheter  managed bowel and bladder  continue to monitor q shift   Swallow/Nutrition/ Hydration   diet modified to full liquids for comfort due to esophagitis, slightly improving PO intake  min assist   increase PO intake and trials of solids as tolerated    ADL's   Max A bathing, dressing, grooming and functional transfers-improved activity tolerance, tolerating sitting up in w/c for 1 hour +, limited Monday 8/14 to bed level ADLs 2/2 elevatrd HR  Mod A bathing, Min A UB dressing, Mod A LB dressing, Mod A Toileting, Min A grooming   activity tolerance, sitting balanc,e  ADL/self-care participation, decreased assistance with transfers  Mobility   max A for rolling in bed & supine>sit with bed features, max<>+2 for bed<>chair transfer with use of steady, assistance to maintain sitting balance, initiating more movements, pt limited by fluctuating vitals during therapy sessions  min A sitting balance, mod A standing balance, min A bed mobility, mod A bed<>chair transfers, mod A ambulation 25 ft  pt & family education, bed mobility, sitting balance, activity tolerance, BLE strengthening   Communication             Safety/Cognition/  Behavioral Observations  no unsafe behavior  min assist  continue to monitor q shift   Pain   no c/o pain  pain less than or equal to 3  monitor q shift   Skin   MASD Gerhardt butt cream w/Telfa, and Alleyn dressing  no additional skin breakdown this admission  continue to monitor q shift    Rehab Goals Patient on target to meet rehab goals: Yes *See Care Plan and progress notes for long and short-term goals.  Barriers to Discharge: Electrolyte abnormalities, hematological abnormalities, wounds, endurance, orthostasis, cognition    Possible Resolutions to Barriers:  Follow labs, cont meds, pressure relief, therapies, IVF    Discharge Planning/Teaching Needs:  Plan to go home with husband and children-son's and daughter in laws assisting. Made aware will need 24 hr physical care      Team Discussion:  Multiple med issues.  Likely to need IVF a few more days.  Better OOB tolerance but watching HR closely.  Still max assist with ADLs.  Goals being set for mod assist overall.  Therapists feel this is obtainable if BP and HR manageable.  Improved initiation and attention.    Revisions to Treatment Plan:  None   Continued Need for Acute Rehabilitation Level of Care: The patient requires daily medical management by a physician with specialized training in physical medicine and rehabilitation for the following conditions: Daily direction of a multidisciplinary physical rehabilitation program to ensure safe treatment while eliciting the highest outcome that is of practical value to the patient.: Yes Daily medical management of patient stability for increased activity during participation in an intensive rehabilitation regime.: Yes Daily analysis of laboratory values and/or radiology reports with any subsequent need for medication adjustment of medical intervention for : Wound care problems;Other;Nutritional problems;Blood pressure problems  [name] 12/08/2015, 11:26 AM

## 2015-12-08 NOTE — Progress Notes (Signed)
      INFECTIOUS DISEASE ATTENDING ADDENDUM:   Date: 12/08/2015  Patient name: Samantha Kline  Medical record number: [id number]  Date of birth: [birth year]    Received call re pts low WBC due to GCV and prior low ANC in context of HIV/AIDS  I would continue IV Gancyclovir as long as the patient is an inpatient, aiming for 21 days of therapy. When she is ready for DC would change her to 900 mg of valcyte BID  She will need GCSF to support her marrow at least twice a week  Continue ARV, PCP prophylaxis  Can trial her off of fluconazole again after 14 days  She has an appt with me on 01/03/16  Please call our inpatient consult team to check on her JUST prior to DC to make sure she has everything she needs at DC   [facility]Cornelius Van Dam 12/08/2015, 7:54 PM

## 2015-12-08 NOTE — Progress Notes (Signed)
Patient's po intake is improving and she usually drinks an Ensure tid with meds per nursing.  She does not want diet to be advanced just yet.  Will d/c foley as mobility and intake improving. Change IVF to nights to help with continence.   Discussed neutropenia with Dr. Zenaida NieceVan Dam--to start Granix with pharm input. Continue IV Gancyclovir X 21 days then transition to oral.  PharmD feels that Granix X 1 should help boost count for 1-2 weeks (per prior response). Will continue to check labs  Mon/Thus

## 2015-12-08 NOTE — Progress Notes (Signed)
Pharmacy Antibiotic Note  Ganciclovir per pharmacy  Marvia Brownis a54 y.o.femaleadmittedon 8/4/2017with CMV. Pt has been transferred to Rehab and Pharmacy has been consulted for continued Ganciclovir dosing.  SCr stable at 0.52 but ANC went down to 1.6 K.  Hgb 8.3 and Plt 287 K.  Today is day 16 of ganciclovir    Plan: Continue Ganciclovir (2.5mg /kg)IV q12 based on new weight; monitor weight  Monitor CBC, especially ANC.  Left note about Granix.   Height: 5\' 4"  (162.6 cm) Weight: 108 lb 3.6 oz (49.1 kg) IBW/kg (Calculated) : 54.7  Temp (24hrs), Avg:98.5 F (36.9 C), Min:98.4 F (36.9 C), Max:98.6 F (37 C)   Recent Labs Lab 12/05/15 0505 12/08/15 0516  WBC 5.6 3.1*  CREATININE 0.66 0.52    Estimated Creatinine Clearance: 60.9 mL/min (by C-G formula based on SCr of 0.8 mg/dL).    Allergies  Allergen Reactions  . Septra [Sulfamethoxazole-Trimethoprim]     Possible cause of hyponatremia     Thank you for allowing pharmacy to be a part of this patient's care.  Tejuan Gholson, Tsz-Yin 12/08/2015 8:22 AM

## 2015-12-08 NOTE — Progress Notes (Signed)
Occupational Therapy Weekly and Daily Progress Note  Patient Details  Name: Samantha Kline MRN: [id number] Date of Birth: Oct 01, 1960  Beginning of progress report period: November 26, 2015 End of progress report period: December 08, 2015    Today's Date: 12/08/2015   Session 1 OT Individual Time: 5009-3818 OT Individual Time Calculation (min): 75 min    Session  2 OT Individual Time: 2993-7169 OT Individual Time Calculation (min): 33 min    Patient has met 3 of 4 short term goals.  Pt is tolerating most of her day up in the tilt-in-space wheelchair, requiring Max A for stand-pivot transfers, and is participating more in her daily self care with increased overall strength , endurance, and improved medical status. Continued to be limited by orthostatics and elevated HR w/ Tachycardia.    Patient continues to demonstrate the following deficits: muscle weakness, decreased endurance, decreased activity tolerance, decreased memory and decreased sitting balance, decreased standing balance and decreased postural control  and therefore will continue to benefit from skilled OT intervention to enhance overall performance with BADL.  Patient progressing toward long term goals..  Continue plan of care.  OT Short Term Goals Week 1:  OT Short Term Goal 1 (Week 1): Pt will complete functional toilet transfer with Max A OT Short Term Goal 1 - Progress (Week 1): Met OT Short Term Goal 2 (Week 1): Pt will complete UB bathing with Min A OT Short Term Goal 2 - Progress (Week 1): Met OT Short Term Goal 3 (Week 1): Pt will complete sit to stand for LB bathing with Max A OT Short Term Goal 3 - Progress (Week 1): Met OT Short Term Goal 4 (Week 1): Pt will engage in 5 minutes of therapeutic activity without rest to increase activity tolerance for self care completion  OT Short Term Goal 4 - Progress (Week 1): Progressing toward goal Week 2:  OT Short Term Goal 1 (Week 2): Pt will complete functional toilet  transfer with Max A OT Short Term Goal 1 - Progress (Week 2): Met OT Short Term Goal 2 (Week 2): Pt will complete UB bathing with Min A OT Short Term Goal 2 - Progress (Week 2): Met OT Short Term Goal 3 (Week 2): Pt will complete sit to stand for LB bathing with Max A OT Short Term Goal 3 - Progress (Week 2): Met OT Short Term Goal 4 (Week 2): Pt will engage in 5 minutes of therapeutic activity without rest to increase activity tolerance for self-care tasks OT Short Term Goal 4 - Progress (Week 2): Progressing toward goal Week 3:  OT Short Term Goal 1 (Week 3): Pt will complete functional shower transfer with Max A OT Short Term Goal 2 (Week 3): Pt will complete lower body dressing with Mod A and no rest breaks OT Short Term Goal 3 (Week 3): Pt will engage in 5 minutes of therapeutic activity without rest to increase activity tolerance for self-care tasks OT Short Term Goal 4 (Week 3): Pt will tolerate standing at the sink for grooming task for 2 munites with moderate assistance for standing balance OT Short Term Goal 5 (Week 3): Pt will be given HEP   Skilled Therapeutic Interventions/Progress Updates:    Session 1:  Pt participated in skilled OT intervention focusing on ADL retraining, improved alertness, functional mobility, functional toilet and shower transfers, and endurance. Pt completed stand-pivot transfer to [facility] w/ Max A, incontinent of stool in standing, but able to finish BM on BSC. Dependent  hygiene performed. Pt then rolled into shower using roll-in shower chair. She required Max A for bathing, but was able to participate in some UB bathing with questioning cues for thoroughness. PT completed UB dressing with Min A, and Max A for LB dressing. RN entered room and changed sacral dressing, then she was left seated in w/c with needs met and safety belt secured.   Session 2: Second skilled OT intervention focused on self-feeding,  increased endurance, sit<>stand, and general  strengthening. Pt utilized utensil appropriately, but required Mod verbal cues to take appropriate bites and to swallow after each bite. OT assisted pt with donning TED hose with Max A, then pt completed 3 sit<>stands with 2 minute rest breaks in between. She then participated in light UE there-ex sitting in w/c. Pt left with spouse in the room, quick release belt secured, and needs met.    Therapy Documentation Precautions:  Precautions Precautions: Fall Restrictions Weight Bearing Restrictions: No Vital Signs: Therapy Vitals Pulse Rate: (!) 149 (s/p stand-pivot ransfer) Pain: Pain Assessment Pain Assessment: Faces Faces Pain Scale: Hurts even more Pain Type: Acute pain Pain Location: Buttocks Pain Intervention(s): Repositioned ADL: ADL Grooming: Setup, Contact guard Where Assessed-Grooming: Sitting at sink, Wheelchair Upper Body Bathing: Moderate assistance Where Assessed-Upper Body Bathing: Sitting at sink Lower Body Bathing: Maximal assistance Where Assessed-Lower Body Bathing: Standing at sink Upper Body Dressing: Maximal assistance Where Assessed-Upper Body Dressing: Edge of bed Lower Body Dressing: Maximal assistance Where Assessed-Lower Body Dressing: Wheelchair Toileting: Dependent Where Assessed-Toileting: Bedside Commode Toilet Transfer: Moderate assistance Toilet Transfer Method: Stand pivot Toilet Transfer Equipment: Engineer, technical sales Transfer: Not assessed  See Function Navigator for Current Functional Status.   Therapy/Group: Individual Therapy  Valma Cava 12/08/2015, 2:59 PM

## 2015-12-08 NOTE — Progress Notes (Signed)
Speech Language Pathology Daily Session Note  Patient Details  Name: Samantha Kline MRN: [id number] Date of Birth: Nov 21, 1960  Today's Date: 12/08/2015 SLP Individual Time: [id number] SLP Individual Time Calculation (min): 48 min   Short Term Goals: Week 2: SLP Short Term Goal 1 (Week 2): Patient will initiate verbal expression of basic wants and needs with Min question cues  SLP Short Term Goal 2 (Week 2): Patient will problem solve use of call bell with Min assist verbal and visual cues  SLP Short Term Goal 3 (Week 2): Patient will initiate self-care tasks in under 15 seconds with no more than 2 verbal cues  SLP Short Term Goal 4 (Week 2): Patient will utilize external aids to recall daily information with Min assist cues SLP Short Term Goal 5 (Week 2): Patient will demonstrate sustained attention to familiar tasks for 5 minutes with Mod verbal cues for redirection  SLP Short Term Goal 6 (Week 2): Pt will consume 25% of full liquids diet with mod encouragement  Skilled Therapeutic Interventions: Pt was seen for skilled ST targeting goals for dysphagia and cognition.  Pt consumed 25% of a bowl of tomato soup and ~5 sips of sprite via straw with min encouragement.  Supervision cues needed for use of standard swallowing precautions and no overt s/s of aspiration were evident with solids or liquids.  Therapist facilitated the session with a novel card game targeting basic problem solving and sustained attention to task.   Pt initially agreeable to participating in card game and was able to sustain her attention to task for ~5 minute intervals with min verbal cues for redirection; however, as session progressed pt became increasingly fatigued and eventually required almost constant verbal cues for redirection.  Pt appeared to become frustrated with therapist's increased cuing and implied that she felt that SLP was being condescending.   Pt could not specifically identify what about therapist's behavior  was upsetting to her and appeared to have paranoid ideation about this therapist discussing pt with other staff members "behind her back"  despite therapist not having had any interaction with staff members immediately before or during session.  Pt increasingly agitated with therapist's attempts to rectify the situation and as a result, session was ended early.  Pt was left in wheelchair with call bell within reach and quick release belt donned for safety.  Continue per current plan of care.    Function:  Eating Eating   Modified Consistency Diet: Yes Eating Assist Level: Set up assist for;Supervision or verbal cues   Eating Set Up Assist For: Opening containers       Cognition Comprehension Comprehension assist level: Understands basic 90% of the time/cues < 10% of the time  Expression   Expression assist level: Expresses basic 50 - 74% of the time/requires cueing 25 - 49% of the time. Needs to repeat parts of sentences.  Social Interaction Social Interaction assist level: Interacts appropriately 50 - 74% of the time - May be physically or verbally inappropriate.  Problem Solving Problem solving assist level: Solves basic 25 - 49% of the time - needs direction more than half the time to initiate, plan or complete simple activities  Memory Memory assist level: Recognizes or recalls 50 - 74% of the time/requires cueing 25 - 49% of the time    Pain Pain Assessment Pain Assessment: No/denies pain  Therapy/Group: Individual Therapy  Jordan Pardini, [name] 12/08/2015, 4:31 PM

## 2015-12-08 NOTE — Progress Notes (Signed)
Physical Therapy Session Note  Patient Details  Name: Samantha Kline MRN: [id number] Date of Birth: 1961/03/02  Today's Date: 12/08/2015 PT Individual Time: 0800-0910 PT Individual Time Calculation (min): 70 min    Short Term Goals: Week 1:  PT Short Term Goal 1 (Week 1): Pt will maintain static and dynamic sitting balance in midline with mod A PT Short Term Goal 1 - Progress (Week 1): Progressing toward goal PT Short Term Goal 2 (Week 1): Pt will perform bed mobility with max A with pt initiating 25% of transfer sequence PT Short Term Goal 2 - Progress (Week 1): Progressing toward goal PT Short Term Goal 3 (Week 1): Pt will perform bed <> w/c transfers with consistent max A of one person and initiating 25% of transfer PT Short Term Goal 3 - Progress (Week 1): Not met PT Short Term Goal 4 (Week 1): Pt will tolerate 1-2 hours up in reclining w/c to improve core strength and positioning for meals PT Short Term Goal 4 - Progress (Week 1): Met PT Short Term Goal 5 (Week 1): Pt will initiate pre-gait activities in standing with max A PT Short Term Goal 5 - Progress (Week 1): Met Week 2:  PT Short Term Goal 1 (Week 2): Pt will maintain static & dynamic sitting without back support consistently with  Mod A. PT Short Term Goal 2 (Week 2): Pt will transfer supine<>sitting with use of hospital bed features & mod A. PT Short Term Goal 3 (Week 2): Pt will perform bed<>chair transfers with consistent Max A +1.  PT Short Term Goal 4 (Week 2): Pt will ambulate 10 ft in parallel bars with Max A.  Skilled Therapeutic Interventions/Progress Updates:     Patient received semirecumbent in bed and agreeable to PT. Patient reports that she is "feeling good" this AM. BP assessed in supine : 138/79, HR 89bpm. PT instructed patient in supine>sit with mod A from PT with HOB elevated and heavy use of rails. Patient able to demonstrated sitting tolerance without back support for 5 minutes. Vitals assessed in sitting  position EOB: BP 121/80, HR 120. Patient able to tolerate sitting EOB, supervision A for PT to don teds and grip socks and correct posterior LOB with use of UE x 3. Stand pivot transfer from bed to [facility] with max A from PT as well as cues for UE placement on PT and LE movement to allow step to chair. Following transfer patient reports upset stomach. Vital assessed following transfer BP 144/95, HR; 126. Patient reclined in tilt in space chair and allowed to rest for 3 minutes, then returned to upright position; vitals reassessed BP 138/90, HR 118bpm.  Seated therex:  LAQ AROM x 8 BLE Marches x 8 BLE  Hip abduction with manula resistance x 10 BLE  Hip adduction x 10 BLE with manual resistance Ankle PF with manual resistance x 15 BLE  Hip extension with manual resistance from PT x 10 BLE   Bicep curls with level 1 tband x 10 BUE  Low row x 8 BUE  Patient noted to have increased HR following hip flexion and both UE therex with bmp~145 with each exercise. With increased HR, patient also reports upset stomach. PT allowed rest break following each activity for ~ 2 minutes and HR returned to 120bpm.   PT instructed patient in sitting tolerance in WC at full upright position. Patient noted to have increased HR to 135 in full upright position, and BP 133/81. HR increased to 140bpm x  4 after 4 -6 minutes of sitting upright. Patient allowed to return to semireclined for 2 minutes with each HR elevation; HR returned to 118-120bpm with each break.   SpO2 remained >97% throughout treatment.   Patient left sitting tilt in space WC with call bell in reach and RN present.     Therapy Documentation Precautions:  Precautions Precautions: Fall Restrictions Weight Bearing Restrictions: No General:   Vital Signs: See above  Pain: 0/10    See Function Navigator for Current Functional Status.   Therapy/Group: Individual Therapy  Lorie Phenix 12/08/2015, 12:19 PM

## 2015-12-09 ENCOUNTER — Inpatient Hospital Stay (HOSPITAL_COMMUNITY): Payer: Managed Care, Other (non HMO)

## 2015-12-09 ENCOUNTER — Inpatient Hospital Stay (HOSPITAL_COMMUNITY): Payer: Managed Care, Other (non HMO) | Admitting: Occupational Therapy

## 2015-12-09 ENCOUNTER — Ambulatory Visit: Payer: Self-pay | Admitting: *Deleted

## 2015-12-09 ENCOUNTER — Inpatient Hospital Stay (HOSPITAL_COMMUNITY): Payer: Managed Care, Other (non HMO) | Admitting: Speech Pathology

## 2015-12-09 DIAGNOSIS — R899 Unspecified abnormal finding in specimens from other organs, systems and tissues: Secondary | ICD-10-CM | POA: Insufficient documentation

## 2015-12-09 DIAGNOSIS — B20 Human immunodeficiency virus [HIV] disease: Secondary | ICD-10-CM

## 2015-12-09 LAB — CBC WITH DIFFERENTIAL/PLATELET
Basophils Absolute: 0.1 10*3/uL (ref 0.0–0.1)
Basophils Relative: 0 %
EOS ABS: 0 10*3/uL (ref 0.0–0.7)
EOS PCT: 0 %
HCT: 26.5 % — ABNORMAL LOW (ref 36.0–46.0)
Hemoglobin: 8.2 g/dL — ABNORMAL LOW (ref 12.0–15.0)
LYMPHS ABS: 1.9 10*3/uL (ref 0.7–4.0)
LYMPHS PCT: 10 %
MCH: 30.3 pg (ref 26.0–34.0)
MCHC: 30.9 g/dL (ref 30.0–36.0)
MCV: 97.8 fL (ref 78.0–100.0)
MONO ABS: 0.7 10*3/uL (ref 0.1–1.0)
Monocytes Relative: 4 %
Neutro Abs: 15.5 10*3/uL — ABNORMAL HIGH (ref 1.7–7.7)
Neutrophils Relative %: 86 %
PLATELETS: 316 10*3/uL (ref 150–400)
RBC: 2.71 MIL/uL — ABNORMAL LOW (ref 3.87–5.11)
RDW: 17.3 % — AB (ref 11.5–15.5)
WBC: 18.2 10*3/uL — ABNORMAL HIGH (ref 4.0–10.5)

## 2015-12-09 NOTE — Progress Notes (Signed)
Physical Therapy Session Note  Patient Details  Name: Samantha Kline MRN: [id number] Date of Birth: 11-07-1960  Today's Date: 12/09/2015 PT Individual Time: 0909-1004 PT Individual Time Calculation (min): 55 min    Short Term Goals: Week 1:  PT Short Term Goal 1 (Week 1): Pt will maintain static and dynamic sitting balance in midline with mod A PT Short Term Goal 1 - Progress (Week 1): Progressing toward goal PT Short Term Goal 2 (Week 1): Pt will perform bed mobility with max A with pt initiating 25% of transfer sequence PT Short Term Goal 2 - Progress (Week 1): Progressing toward goal PT Short Term Goal 3 (Week 1): Pt will perform bed <> w/c transfers with consistent max A of one person and initiating 25% of transfer PT Short Term Goal 3 - Progress (Week 1): Not met PT Short Term Goal 4 (Week 1): Pt will tolerate 1-2 hours up in reclining w/c to improve core strength and positioning for meals PT Short Term Goal 4 - Progress (Week 1): Met PT Short Term Goal 5 (Week 1): Pt will initiate pre-gait activities in standing with max A PT Short Term Goal 5 - Progress (Week 1): Met Week 2:  PT Short Term Goal 1 (Week 2): Pt will maintain static & dynamic sitting without back support consistently with  Mod A. PT Short Term Goal 2 (Week 2): Pt will transfer supine<>sitting with use of hospital bed features & mod A. PT Short Term Goal 3 (Week 2): Pt will perform bed<>chair transfers with consistent Max A +1.  PT Short Term Goal 4 (Week 2): Pt will ambulate 10 ft in parallel bars with Max A.  Skilled Therapeutic Interventions/Progress Updates:     Patient received supine in bed and agreeable to PT.  PT assisted patient to don Ted hos and grip socks with max A. As well as to don pants with gluteal lift to bring pants to waist.  Supine to sitting through log roll with min A from Pt and mod cues for use of UE on rails. Patient reports slight dizziness with supine>sit that resolved after 1 minutes; see  BP below  Patient performed stand pivot transfer with max A from PT and BUE on therapist PT, PT provided mod cues for LE placement and improved weight shifting. Patients HR increased to 136. Sit<>stand followed by standing tolerance of 1.5 minutes with orthostasic vital check; see below.   Patient transported to hall way for gait training x 15 ft with max A +2 from PT and BUE support on therapist with close [place] follow. PT provided mod-max multimodal cues for improved posture, as well as increased step length L>R. Patient able to demonstrate step through pattern 25% of the time with cues and step to gait due to decreased strength. HR increased to 145bpm following gait and returned to 120 with 4 minute rest break.  Patient instructed in stand pivot transfer to standard WC from tilt in space WC with max A and heavy use of arm rests for support. Mod cues for LE postioning as well as UE placement on arm rests for control of descent into seat.   WC mobility training instructed by PT for 69f with min A from PT to prevent veer to R. PT provided mod cues for improved shoulder ROM and increased use of R UE to maintain stright trajectory. HR increased to 135, and reduced to 120s with 3 minute rest break.   Patient transfer back to tilt in space WC following  WC mobility with stand pivot transfer with Max A.  Patient left sitting in [facility] with call bell in reach and all needs met.   Ortostatic vital:  Supine: 107/69, 108 bpm Sitting: 105:71; 128bpm  Standing: 99/64; 146bpm      Therapy Documentation Precautions:  Precautions Precautions: Fall Restrictions Weight Bearing Restrictions: No General:   Vital Signs: Therapy Vitals Temp: 98.4 F (36.9 C) Temp Source: Oral Pulse Rate: 96 Resp: 18 BP: 118/77 Patient Position (if appropriate): Sitting Oxygen Therapy SpO2: 100 % O2 Device: Not Delivered Pain: 0/10      See Function Navigator for Current Functional Status.   Therapy/Group:  Individual Therapy  Lorie Phenix 12/09/2015, 1:41 PM

## 2015-12-09 NOTE — Progress Notes (Signed)
Speech Language Pathology Daily Session Note  Patient Details  Name: Samantha Kline MRN: [id number] Date of Birth: 1961/01/06  Today's Date: 12/09/2015 SLP Individual Time: 1435-1531 SLP Individual Time Calculation (min): 56 min   Short Term Goals: Week 2: SLP Short Term Goal 1 (Week 2): Patient will initiate verbal expression of basic wants and needs with Min question cues  SLP Short Term Goal 2 (Week 2): Patient will problem solve use of call bell with Min assist verbal and visual cues  SLP Short Term Goal 3 (Week 2): Patient will initiate self-care tasks in under 15 seconds with no more than 2 verbal cues  SLP Short Term Goal 4 (Week 2): Patient will utilize external aids to recall daily information with Min assist cues SLP Short Term Goal 5 (Week 2): Patient will demonstrate sustained attention to familiar tasks for 5 minutes with Mod verbal cues for redirection  SLP Short Term Goal 6 (Week 2): Pt will consume 25% of full liquids diet with mod encouragement  Skilled Therapeutic Interventions:  Pt was seen for skilled ST targeting cognitive goals.  Pt bright and alert upon arrival and was able to recall ~3 details from previous therapy session with min question cues.  Pt with complaints of nausea, RN made aware and administered medication.  Pt also with bladder urgency during therapy session but was able to initiate request to use the restroom with mod I and hold bladder until she could be transferred to toilet with assistance from nurse tech for continent void.  Of note, nurse tech reported while in pt's room that pt's appetite has been better today and pt has consumed close to 100% of meals.  Discussed with pt reinitiating solids as part of soft diet.  Pt verbalized readiness for advancement.  SLP discussed with P.A. Who is in agreement.  Therapist facilitated the session with a novel verbal description task targeting sustained attention.  Pt was able to sustain her attention to task for its  duration (15 minutes) in a quiet environment with no cues needed for redirection.  Pt was left in wheelchair with husband at bedside and quick release belt donned.  Continue per current plan of care.    Function:  Eating Eating                 Cognition Comprehension Comprehension assist level: Understands basic 90% of the time/cues < 10% of the time  Expression   Expression assist level: Expresses basic 75 - 89% of the time/requires cueing 10 - 24% of the time. Needs helper to occlude trach/needs to repeat words.  Social Interaction Social Interaction assist level: Interacts appropriately 75 - 89% of the time - Needs redirection for appropriate language or to initiate interaction.  Problem Solving Problem solving assist level: Solves basic 75 - 89% of the time/requires cueing 10 - 24% of the time  Memory Memory assist level: Recognizes or recalls 50 - 74% of the time/requires cueing 25 - 49% of the time    Pain Pain Assessment Pain Assessment: No/denies pain  Therapy/Group: Individual Therapy  Azelie Noguera, [name] 12/09/2015, 4:01 PM

## 2015-12-09 NOTE — Patient Instructions (Signed)
Please refer to progress note for the details of today's visit 

## 2015-12-09 NOTE — Progress Notes (Signed)
Occupational Therapy Session Note  Patient Details  Name: Samantha Kline MRN: [id number] Date of Birth: 1960-09-28  Today's Date: 12/09/2015 OT Individual Time: 3912-2583 OT Individual Time Calculation (min): 59 min    Short Term Goals: Week 2:  OT Short Term Goal 1 (Week 2): Pt will complete functional toilet transfer with Max A OT Short Term Goal 1 - Progress (Week 2): Met OT Short Term Goal 2 (Week 2): Pt will complete UB bathing with Min A OT Short Term Goal 2 - Progress (Week 2): Met OT Short Term Goal 3 (Week 2): Pt will complete sit to stand for LB bathing with Max A OT Short Term Goal 3 - Progress (Week 2): Met OT Short Term Goal 4 (Week 2): Pt will engage in 5 minutes of therapeutic activity without rest to increase activity tolerance for self-care tasks OT Short Term Goal 4 - Progress (Week 2): Progressing toward goal  Skilled Therapeutic Interventions/Progress Updates:   Pt seen for skilled OT addressing functional toilet/shower transfers, ADL participation, general strengthening, and activity tolerance. Pt required max A for BSC and shower transfer today. She had + BM and required Max A to stand and maintain standing balance while 2nd helper assisted with toileting and clothing management. Pt tolerated standing for longer today, and tolerated showering and dressing with multiple sit<> stands and Mod>Max A for LB ADLs.   Therapy Documentation Precautions:  Precautions Precautions: Fall Restrictions Weight Bearing Restrictions: No   Pain: Pain Assessment Pain Assessment: No/denies pain  See Function Navigator for Current Functional Status.   Therapy/Group: Individual Therapy  Valma Cava 12/09/2015, 4:46 PM

## 2015-12-09 NOTE — Progress Notes (Signed)
[facility] PHYSICAL MEDICINE & REHABILITATION     PROGRESS NOTE  Subjective/Complaints:  Pt sitting up in bed this AM eating breakfast.  She states she had a "great" night.  Pt encouraged to drink more fluids.     ROS: Denies CP, SOB, N/V/D.  Objective: Vital Signs: Blood pressure 111/65, pulse 99, temperature 98.3 F (36.8 C), temperature source Oral, resp. rate 18, height 5\' 4"  (1.626 m), weight 49.1 kg (108 lb 3.6 oz), SpO2 99 %. No results found.  Recent Labs  12/08/15 0516 12/09/15 0548  WBC 3.1* 18.2*  HGB 8.3* 8.2*  HCT 26.2* 26.5*  PLT 287 316    Recent Labs  12/08/15 0516  NA 136  K 4.3  CL 108  GLUCOSE 70  BUN 10  CREATININE 0.52  CALCIUM 7.6*   CBG (last 3)  No results for input(s): GLUCAP in the last 72 hours.  Wt Readings from Last 3 Encounters:  12/05/15 49.1 kg (108 lb 3.6 oz)  11/23/15 60.6 kg (133 lb 11.2 oz)  10/31/15 58.6 kg (129 lb 3 oz)    Physical Exam:  BP 111/65 (BP Location: Right Arm)   Pulse 99   Temp 98.3 F (36.8 C) (Oral)   Resp 18   Ht 5\' 4"  (1.626 m)   Wt 49.1 kg (108 lb 3.6 oz)   SpO2 99%   BMI 18.58 kg/m  Constitutional: Vital signs are normal. NAD. She appears cachectic. She has a sickly appearance.  HENT: Normocephalic and atraumatic.  Eyes: Conjunctivae and EOM are normal.  Cardiovascular: Regular rate and rhythm. No murmur heard. Respiratory: Effort normal and breath sounds normal. No stridor. No respiratory distress. She has no wheezes.  GI: Soft. Bowel sounds are normal. She exhibits no distension. There is no tenderness.  Musculoskeletal: She exhibits no edema or tenderness.  Muscle wasting all limbs.  Neurological:  A&O X3. Motor:  B/l UE: 4/5 deltoid, biceps, triceps, 4/5 wrist and HI.  B/l LE: 4/5 HF, 4+/5 KE/KF and 4+/5 ADF/PF.  Skin: Skin is warm and dry. No rash noted. No erythema.  6 small moisture associated ulcers sacrum area (not examined today). Psychiatric: Her affect is blunt. She is slowed.  Cognition and memory are impaired.   Assessment/Plan: 1. 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 Physiatrist is providing close team supervision and 24 hour management of active medical problems listed below. Physiatrist and rehab team continue to assess barriers to discharge/monitor patient progress toward functional and medical goals.  Function:  Bathing Bathing position   Position: Shower  Bathing parts Body parts bathed by patient: Chest, Left arm, Right arm, Right upper leg, Left upper leg Body parts bathed by helper: Right lower leg, Left lower leg, Buttocks, Abdomen, Front perineal area  Bathing assist Assist Level: 2 helpers   Set up : To obtain items  Upper Body Dressing/Undressing Upper body dressing   What is the patient wearing?: Pull over shirt/dress     Pull over shirt/dress - Perfomed by patient: Thread/unthread right sleeve, Thread/unthread left sleeve Pull over shirt/dress - Perfomed by helper: Pull shirt over trunk, Put head through opening Button up shirt - Perfomed by patient: Thread/unthread left sleeve Button up shirt - Perfomed by helper: Thread/unthread right sleeve, Pull shirt around back, Button/unbutton shirt    Upper body assist Assist Level: Touching or steadying assistance(Pt > 75%)   Set up : To obtain clothing/put away  Lower Body Dressing/Undressing Lower body dressing   What is the patient wearing?: Non-skid slipper socks, Ted Hose, Pants   Underwear - Performed by helper: Thread/unthread right underwear leg, Thread/unthread left underwear leg, Pull underwear up/down Pants- Performed by patient: Fasten/unfasten pants Pants- Performed by helper: Pull pants up/down, Thread/unthread left pants leg, Thread/unthread right pants leg   Non-skid slipper socks- Performed by helper: Don/doff right sock, Don/doff left sock                TED Hose - Performed by helper: Don/doff right TED hose, Don/doff left TED hose  Lower body assist Assist for lower body dressing: Touching or steadying assistance (Pt > 75%)      Toileting Toileting Toileting activity did not occur: No continent bowel/bladder event   Toileting steps completed by helper: Performs perineal hygiene, Adjust clothing prior to toileting, Adjust clothing after toileting Toileting Assistive Devices: Grab bar or rail  Toileting assist Assist level: Two helpers   Transfers Chair/bed transfer Chair/bed transfer activity did not occur: Safety/medical concerns Chair/bed transfer method: Stand pivot Chair/bed transfer assist level: Maximal assist (Pt 25 - 49%/lift and lower) Chair/bed transfer assistive device: Mechanical lift Mechanical lift: Stedy   Locomotion Ambulation Ambulation activity did not occur: Safety/medical concerns         Wheelchair   Type: Manual Max wheelchair distance: 150 Assist Level: Dependent (Pt equals 0%)  Cognition Comprehension Comprehension assist level: Understands basic 90% of the time/cues < 10% of the time  Expression Expression assist level: Expresses basic 50 - 74% of the time/requires cueing 25 - 49% of the time. Needs to repeat parts of sentences.  Social Interaction Social Interaction assist level: Interacts appropriately 50 - 74% of the time - May be physically or verbally inappropriate.  Problem Solving Problem solving assist level: Solves basic 25 - 49% of the time - needs direction more than half the time to initiate, plan or complete simple activities  Memory Memory assist level: Recognizes or recalls 50 - 74% of the time/requires cueing 25 - 49% of the time    Medical Problem List and Plan: 1.  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  DVT Prophylaxis/Anticoagulation: Mechanical: Sequential compression devices, below knee Bilateral lower extremities 3. Pain  Management: Tylenol prn 4. Mood: LCSW to follow for evaluation and support.  5. Neuropsych: This patient is not fully capable of making decisions on her own behalf. 6. Skin/Wound Care: Ordered air mattress overlay due to malnutrition, poor intake   Moisture associated sacral ulcer, pressure relief, keep dry, protein supplementation 7. Fluids/Electrolytes/Nutrition:  Continue dextrose for now.   Liquid diet at present, will advance as tolerated 8. HIV/AIDS with MAC?:  On descovy, ticovey  Biaxin and ethambutol d/ced per ID on 8/9.   Follow up per ID. Appreciate recs 9. Candida/CMV esophagitis: Diflucan and on Ganciclovir since 08/03 for 21 day therapy/while inpatient be changed to PO 900 mg of valcyte BID at discharge per ID.  Appreciate recs.  Trial off fluconazole after 14 days per ID 10. FTT: Continue IVF for now. Started calorie count .   11. Potential for pancytopenia/fluctuation:  Check labs periodically given medications  GCSF to support marrow at least twice a week per ID 12. Hyponatremia/Hypokalemia: Monitor lytes with Mg and Phos levels every 3-4 days.   Na+ 136 on 8/17: Will cont to monitor, treat if necessary  K+ 4.3 on 8/17.   Cont to monitor 13. PCP prophylaxis: Bactrim  discontinued due to hyponatremia. Has been treated for PCP infection and dapsone resumed 8/4  for prophylaxis.  14. Hypoalbuminemia  Supplement started 8/6 15. Leukocytosis: Resolved 16. Anemia of chronic disease  Hb 8.3 on 8/17  Cont to monitor 17. Orthostasis: Appears to be improving  TEDS, abdominal binder  IVF qHS x2 days  Encouraged more fluids  LOS (Days) 14 A FACE TO FACE EVALUATION WAS PERFORMED  Samantha Kline Karis Jubanil Dane Bloch 12/09/2015 9:26 AM

## 2015-12-09 NOTE — Progress Notes (Signed)
RN received a referral from Dr Daiva [place] requesting a hospital visit with the patient to offer services. RN made a hospital visit with the patient today. Patient was noted to be sitting comfortably in her chair. She was alert and oriented and interested in our conversation. Samantha Kline expressed that she was surprised to receive the news that she is HIV positive and has shared the information with her closest sister. Samantha Kline states her husband is planning to get tested as well. Patient stated her mom has a lot of health concerns and she does not want to burden her family with sharing the news of her HIV status. Together was spoke about how the HIV virus is transmitted and we discussed the process of grief which includes a feeling of guilt. RN explained to Samantha Kline my services and how I would love to help her once she is discharged from the rehabilitation center. Samantha Kline is interested and I look forward to connecting with her once she is discharged.

## 2015-12-09 NOTE — Progress Notes (Signed)
Orthopedic Tech Progress Note Patient Details:  Lorna FewHythia Azbill Aug 11, 1960 [id number]  Ortho Devices Type of Ortho Device: Abdominal binder   Saul FordyceJennifer C Giavonni Fonder 12/09/2015, 11:03 AM

## 2015-12-10 ENCOUNTER — Inpatient Hospital Stay (HOSPITAL_COMMUNITY): Payer: Managed Care, Other (non HMO) | Admitting: Physical Therapy

## 2015-12-10 ENCOUNTER — Inpatient Hospital Stay (HOSPITAL_COMMUNITY): Payer: Managed Care, Other (non HMO)

## 2015-12-10 NOTE — Progress Notes (Signed)
Occupational Therapy Session Note  Patient Details  Name: Samantha Kline MRN: [id number] Date of Birth: 04/20/1961  Today's Date: 12/10/2015 OT Individual Time: 5035-4656 OT Individual Time Calculation (min): 50 min     Short Term Goals: Week 1:  OT Short Term Goal 1 (Week 1): Pt will complete functional toilet transfer with Max A OT Short Term Goal 1 - Progress (Week 1): Met OT Short Term Goal 2 (Week 1): Pt will complete UB bathing with Min A OT Short Term Goal 2 - Progress (Week 1): Met OT Short Term Goal 3 (Week 1): Pt will complete sit to stand for LB bathing with Max A OT Short Term Goal 3 - Progress (Week 1): Met OT Short Term Goal 4 (Week 1): Pt will engage in 5 minutes of therapeutic activity without rest to increase activity tolerance for self care completion  OT Short Term Goal 4 - Progress (Week 1): Progressing toward goal  Skilled Therapeutic Interventions/Progress Updates:    OT session focused on LB dressing, sitting balance, strengthening, and activity tolerance. Pt received supine in bed requesting to "clean up" and get dressed. Completed peri and buttocks hygiene at bed level with total assist from OT and mod A for rolling L<>R. Transitioned supine>sit with min A and OT donned abdominal binder. Completed threading of BLEs with pt threading RLE with supervision. Pt verbalized increaed dizziness therefore transitioned to supine and BP was 134/72 with HR 92. Provided long rest break before managing pants around waist from bed level. At end of session, pt left supine in bed with HOB elevated and all needs in reach.  Therapy Documentation Precautions: Precautions Precautions: Fall Restrictions Weight Bearing Restrictions: No General:   Vital Signs: Therapy Vitals Temp: 98.4 F (36.9 C) Temp Source: Oral Resp: 18 Oxygen Therapy SpO2: 99 % O2 Device: Not Delivered Pain:   ADL: ADL Grooming: Setup, Contact guard Where Assessed-Grooming: Sitting at sink,  Wheelchair Upper Body Bathing: Moderate assistance Where Assessed-Upper Body Bathing: Sitting at sink Lower Body Bathing: Maximal assistance Where Assessed-Lower Body Bathing: Standing at sink Upper Body Dressing: Maximal assistance Where Assessed-Upper Body Dressing: Edge of bed Lower Body Dressing: Maximal assistance Where Assessed-Lower Body Dressing: Wheelchair Toileting: Dependent Where Assessed-Toileting: Bedside Commode Toilet Transfer: Moderate assistance Toilet Transfer Method: Stand pivot Toilet Transfer Equipment: Engineer, technical sales Transfer: Not assessed Exercises:   Other Treatments:    See Function Navigator for Current Functional Status.   Therapy/Group: Individual Therapy  Camila Norville, Quillian Quince 12/10/2015, 9:30 AM

## 2015-12-10 NOTE — Progress Notes (Signed)
Physical Therapy Session Note  Patient Details  Name: Samantha Kline MRN: [id number] Date of Birth: 12-10-1960  Today's Date: 12/10/2015 PT Individual Time: 2161-6328 PT Individual Time Calculation (min): 70 min    Short Term Goals: Week 1:  PT Short Term Goal 1 (Week 1): Pt will maintain static and dynamic sitting balance in midline with mod A PT Short Term Goal 1 - Progress (Week 1): Progressing toward goal PT Short Term Goal 2 (Week 1): Pt will perform bed mobility with max A with pt initiating 25% of transfer sequence PT Short Term Goal 2 - Progress (Week 1): Progressing toward goal PT Short Term Goal 3 (Week 1): Pt will perform bed <> w/c transfers with consistent max A of one person and initiating 25% of transfer PT Short Term Goal 3 - Progress (Week 1): Not met PT Short Term Goal 4 (Week 1): Pt will tolerate 1-2 hours up in reclining w/c to improve core strength and positioning for meals PT Short Term Goal 4 - Progress (Week 1): Met PT Short Term Goal 5 (Week 1): Pt will initiate pre-gait activities in standing with max A PT Short Term Goal 5 - Progress (Week 1): Met Week 2:  PT Short Term Goal 1 (Week 2): Pt will maintain static & dynamic sitting without back support consistently with  Mod A. PT Short Term Goal 2 (Week 2): Pt will transfer supine<>sitting with use of hospital bed features & mod A. PT Short Term Goal 3 (Week 2): Pt will perform bed<>chair transfers with consistent Max A +1.  PT Short Term Goal 4 (Week 2): Pt will ambulate 10 ft in parallel bars with Max A.  Skilled Therapeutic Interventions/Progress Updates:      Patient received supine in bed and agreeable to PT. Pt noted to have abdominal binder and ted hose in place at start of therapy session.  PT assessed orthostatic vital signs; see below. Supine>sit with min A from PT and moderate use of bed features. Sit<>stand with mod A and push from bed rails. Stand pivot transfer with mod A and min cues for UE positioning  on WC arm rests.  PT transported patient to rehab gym for gait training 66ft and 7ft with RW and mod A from PT. Patient HR measured after ambulation trials at 114 and 125 respectively. PT provided min cues for proper use of UD in turns and increased step length bilaterally.   Throughout treatment patient performed sit<>stand x 8 with min using RW and Mod A without AD. PT provided min-mod ues for proper use of UE to push from sitting surface.   Car transfer with mod A and RW. Mod cues for LE and AD management. Pt noted to have posterior LOB with turn to sit in car. Able to correct with increased WB through BUE with cues from PT.   Sitting tolerance on edege of mat x 5 minutes with supervision-mod I. Patient noted no increased dizziness with HR 104-114 throughout 5 minutes.   WC mobility training x17ft and 125 with min A progressing to Mod A for maintainance of  Straight trajectory and improved use of R UE to prevent veer to R.  Stair negotiation up/down 1 step(3") x 4 with mod A from PT. Mod cues for improved step height and increased hip and knee flexion for improve foot clearance. HR following stair training. 117 and decreased to 110 after 1 min.   Patient performed stand pivot transfer with mod A to tilt in space WC and  left sitting with call bell in reach.    Therapy Documentation Precautions:  Precautions Precautions: Fall Restrictions Weight Bearing Restrictions: No General:   Vital Signs: Therapy Vitals Temp Source: Oral Resp: 18 BP: (!) 138/93 (rn notifed) Patient Position (if appropriate): Sitting Oxygen Therapy O2 Device: Not Delivered   Orthostatic vitals: with binder and ted hose  Supine 131/84; HR: 84 bpm Sitting: 133/87: HR 98bpm Standing: 128/80; HR 125bpm   Pain: 0/10  See Function Navigator for Current Functional Status.   Therapy/Group: Individual Therapy  Samantha Kline 12/10/2015, 12:56 PM

## 2015-12-10 NOTE — Progress Notes (Signed)
[place] PHYSICAL MEDICINE & REHABILITATION     PROGRESS NOTE  Subjective/Complaints:  No issues overnite Pt denies pains, tolerated therapy , aware that she has another in a few minutes  ROS: Denies CP, SOB, N/V/D.  Objective: Vital Signs: Blood pressure 121/71, pulse 96, temperature 98.4 F (36.9 C), temperature source Oral, resp. rate 18, height 5\' 4"  (1.626 m), weight 49.1 kg (108 lb 3.6 oz), SpO2 99 %. No results found.  Recent Labs  12/08/15 0516 12/09/15 0548  WBC 3.1* 18.2*  HGB 8.3* 8.2*  HCT 26.2* 26.5*  PLT 287 316    Recent Labs  12/08/15 0516  NA 136  K 4.3  CL 108  GLUCOSE 70  BUN 10  CREATININE 0.52  CALCIUM 7.6*   CBG (last 3)  No results for input(s): GLUCAP in the last 72 hours.  Wt Readings from Last 3 Encounters:  12/05/15 49.1 kg (108 lb 3.6 oz)  11/23/15 60.6 kg (133 lb 11.2 oz)  10/31/15 58.6 kg (129 lb 3 oz)    Physical Exam:  BP 121/71 (BP Location: Left Arm)   Pulse 96   Temp 98.4 F (36.9 C) (Oral)   Resp 18   Ht 5\' 4"  (1.626 m)   Wt 49.1 kg (108 lb 3.6 oz)   SpO2 99%   BMI 18.58 kg/m  Constitutional: Vital signs are normal. NAD. She appears cachectic. She has a sickly appearance.  HENT: Normocephalic and atraumatic.  Eyes: Conjunctivae and EOM are normal.  Cardiovascular: Regular rate and rhythm. No murmur heard. Respiratory: Effort normal and breath sounds normal. No stridor. No respiratory distress. She has no wheezes.  GI: Soft. Bowel sounds are normal. She exhibits no distension. There is no tenderness.  Musculoskeletal: She exhibits no edema or tenderness.  Muscle wasting all limbs.  Neurological:  A&O X3. Motor:  B/l UE: 4/5 deltoid, biceps, triceps, 4/5 wrist and HI.  B/l LE: 4/5 HF, 4+/5 KE/KF and 4+/5 ADF/PF.  Skin: Skin is warm and dry. No rash noted. No erythema.  6 small moisture associated ulcers sacrum area (not examined today). Psychiatric: Her affect is blunt. She is slowed. Cognition and memory are  impaired.   Assessment/Plan: 1. 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 Physiatrist is providing close team supervision and 24 hour management of active medical problems listed below. Physiatrist and rehab team continue to assess barriers to discharge/monitor patient progress toward functional and medical goals.  Function:  Bathing Bathing position   Position: Shower  Bathing parts Body parts bathed by patient: Chest, Right arm, Left arm, Left upper leg, Right upper leg Body parts bathed by helper: Left lower leg, Right lower leg, Buttocks, Front perineal area  Bathing assist Assist Level: 2 helpers   Set up : To obtain items  Upper Body Dressing/Undressing Upper body dressing   What is the patient wearing?: Pull over shirt/dress     Pull over shirt/dress - Perfomed by patient: Thread/unthread right sleeve, Thread/unthread left sleeve, Pull shirt over trunk Pull over shirt/dress - Perfomed by helper: Put head through opening Button up shirt - Perfomed by patient: Thread/unthread left sleeve Button up shirt - Perfomed by helper: Thread/unthread right sleeve, Pull shirt around back, Button/unbutton shirt    Upper body assist Assist Level: Touching or steadying assistance(Pt > 75%)   Set up : To obtain clothing/put away  Lower Body Dressing/Undressing Lower body dressing   What is  the patient wearing?: Non-skid slipper socks, Pants, Socks, [employer]. [employer]neyed Hose   Underwear - Performed by helper: Thread/unthread right underwear leg, Thread/unthread left underwear leg, Pull underwear up/down Pants- Performed by patient: Fasten/unfasten pants Pants- Performed by helper: Thread/unthread right pants leg, Thread/unthread left pants leg, Pull pants up/down   Non-skid slipper socks- Performed by helper: Don/doff right sock, Don/doff left sock               TED Hose - Performed by  helper: Don/doff right TED hose, Don/doff left TED hose  Lower body assist Assist for lower body dressing: 2 Helpers      Financial traderToileting Toileting Toileting activity did not occur: No continent bowel/bladder event   Toileting steps completed by helper: Adjust clothing prior to toileting, Performs perineal hygiene, Adjust clothing after toileting Toileting Assistive Devices: Grab bar or rail  Toileting assist Assist level: Two helpers   Transfers Chair/bed transfer Chair/bed transfer activity did not occur: Safety/medical concerns Chair/bed transfer method: Stand pivot Chair/bed transfer assist level: Maximal assist (Pt 25 - 49%/lift and lower) Chair/bed transfer assistive device: Armrests, Mechanical lift Mechanical lift: Stedy   Locomotion Ambulation Ambulation activity did not occur: Safety/medical concerns   Max distance: 3115ft Assist level: Maximal assist (Pt 25 - 49%)   Wheelchair   Type: Manual Max wheelchair distance: 3450ft Assist Level: Touching or steadying assistance (Pt > 75%)  Cognition Comprehension Comprehension assist level: Understands basic 90% of the time/cues < 10% of the time  Expression Expression assist level: Expresses basic 75 - 89% of the time/requires cueing 10 - 24% of the time. Needs helper to occlude trach/needs to repeat words.  Social Interaction Social Interaction assist level: Interacts appropriately 75 - 89% of the time - Needs redirection for appropriate language or to initiate interaction.  Problem Solving Problem solving assist level: Solves basic 75 - 89% of the time/requires cueing 10 - 24% of the time  Memory Memory assist level: Recognizes or recalls 50 - 74% of the time/requires cueing 25 - 49% of the time    Medical Problem List and Plan: 1.  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  DVT Prophylaxis/Anticoagulation: Mechanical: Sequential compression devices, below knee Bilateral lower  extremities 3. Pain Management: Tylenol prn 4. Mood: LCSW to follow for evaluation and support.  5. Neuropsych: This patient is not fully capable of making decisions on her own behalf. 6. Skin/Wound Care: Ordered air mattress overlay due to malnutrition, poor intake   Moisture associated sacral ulcer, pressure relief, keep dry, protein supplementation 7. Fluids/Electrolytes/Nutrition:  Continue dextrose for now.   Liquid diet at present, will advance as tolerated 8. HIV/AIDS with MAC?:  On descovy, ticovey  Biaxin and ethambutol d/ced per ID on 8/9.   Follow up per ID. Appreciate recs 9. Candida/CMV esophagitis: Diflucan and on Ganciclovir since 08/03 for 21 day therapy/while inpatient be changed to PO 900 mg of valcyte BID at discharge per ID.  Appreciate recs.  Trial off fluconazole after 14 days per ID 10. FTT: Continue IVF for now. Started calorie count .   11. Potential for pancytopenia/fluctuation:  Check labs periodically given medications  GCSF to support marrow at least twice a week per ID 12. Hyponatremia/Hypokalemia: Monitor lytes with Mg and Phos levels every 3-4 days.   Na+ 136 on 8/17: Will cont to monitor, treat if necessary  K+ 4.3 on 8/17.   Cont to monitor 13. PCP prophylaxis: Bactrim discontinued due to hyponatremia. Has been  treated for PCP infection and dapsone resumed 8/4  for prophylaxis.  14. Hypoalbuminemia  Supplement started 8/6 15. Leukocytosis: recurrent, afebrile- will contact ID if pt becomes febrile 16. Anemia of chronic disease  Hb 8.3 on 8/17  Cont to monitor 17. Orthostasis: Appears to be improving  TEDS, abdominal binder  IVF qHS x2 days  Encouraged more fluids  LOS (Days) 15 A FACE TO FACE EVALUATION WAS PERFORMED  Samantha LawsKIRSTEINS,Samantha Kline E 12/10/2015 9:14 AM

## 2015-12-11 ENCOUNTER — Inpatient Hospital Stay (HOSPITAL_COMMUNITY): Payer: Managed Care, Other (non HMO) | Admitting: Physical Therapy

## 2015-12-11 ENCOUNTER — Inpatient Hospital Stay (HOSPITAL_COMMUNITY): Payer: Managed Care, Other (non HMO) | Admitting: Occupational Therapy

## 2015-12-11 NOTE — Plan of Care (Signed)
Problem: RH Bed Mobility Goal: LTG Patient will perform bed mobility with assist (PT) LTG: Patient will perform bed mobility with assistance, with/without cues (PT).  With hospital bed features; upgrade 2/2 progress

## 2015-12-11 NOTE — Progress Notes (Signed)
[facility]     PROGRESS NOTE  Subjective/Complaints:   No issues overnite per RN, RN notes decubiti on buttocks Pt incont, PVRs reviewed with RN, look normal  ROS: Denies CP, SOB, N/V/D.  Objective: Vital Signs: Blood pressure (!) 138/93, pulse 96, temperature 97.9 F (36.6 C), temperature source Oral, resp. rate 20, height 5\' 4"  (1.626 m), weight 46.3 kg (102 lb), SpO2 99 %. No results found.  Recent Labs  12/09/15 0548  WBC 18.2*  HGB 8.2*  HCT 26.5*  PLT 316   No results for input(s): NA, K, CL, GLUCOSE, BUN, CREATININE, CALCIUM in the last 72 hours.  Invalid input(s): CO CBG (last 3)  No results for input(s): GLUCAP in the last 72 hours.  Wt Readings from Last 3 Encounters:  12/11/15 46.3 kg (102 lb)  11/23/15 60.6 kg (133 lb 11.2 oz)  10/31/15 58.6 kg (129 lb 3 oz)    Physical Exam:  BP (!) 138/93 (BP Location: Right Arm) Comment: rn notifed  Pulse 96   Temp 97.9 F (36.6 C) (Oral)   Resp 20   Ht 5\' 4"  (1.626 m)   Wt 46.3 kg (102 lb)   SpO2 99%   BMI 17.51 kg/m  Constitutional: Vital signs are normal. NAD. She appears cachectic. She has a sickly appearance.  HENT: Normocephalic and atraumatic.  Eyes: Conjunctivae and EOM are normal.  Cardiovascular: Regular rate and rhythm. No murmur heard. Respiratory: Effort normal and breath sounds normal. No stridor. No respiratory distress. She has no wheezes.  GI: Soft. Bowel sounds are normal. She exhibits no distension. There is no tenderness.  Musculoskeletal: She exhibits no edema or tenderness.  Muscle wasting all limbs.  Neurological:  A&O X3. Motor:  B/l UE: 4/5 deltoid, biceps, triceps, 4/5 wrist and HI.  B/l LE: 4/5 HF, 4+/5 KE/KF and 4+/5 ADF/PF.  Skin: Skin is warm and dry. No rash noted. No erythema.  3 small moisture associated ulcers sacrum area  Psychiatric: Her affect is blunt. She is slowed. Cognition and memory are impaired.   Assessment/Plan: 1. 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 Physiatrist is providing close team supervision and 24 hour management of active medical problems listed below. Physiatrist and rehab team continue to assess barriers to discharge/monitor patient progress toward functional and medical goals.  Function:  Bathing Bathing position   Position: Shower  Bathing parts Body parts bathed by patient: Chest, Right arm, Left arm, Left upper leg, Right upper leg Body parts bathed by helper: Left lower leg, Right lower leg, Buttocks, Front perineal area  Bathing assist Assist Level: 2 helpers   Set up : To obtain items  Upper Body Dressing/Undressing Upper body dressing   What is the patient wearing?: Pull over shirt/dress     Pull over shirt/dress - Perfomed by patient: Thread/unthread right sleeve, Thread/unthread left sleeve, Pull shirt over trunk, Put head through opening Pull over shirt/dress - Perfomed by helper: Put head through opening Button up shirt - Perfomed by patient: Thread/unthread left sleeve Button up shirt - Perfomed by helper: Thread/unthread right sleeve, Pull shirt around back, Button/unbutton shirt    Upper body assist Assist Level: Set up   Set up : To obtain clothing/put away  Lower Body Dressing/Undressing Lower body dressing   What is the patient wearing?: Non-skid slipper socks, Pants, Socks, [employer]ed Hose   Underwear - Performed by helper: Thread/unthread  right underwear leg, Thread/unthread left underwear leg, Pull underwear up/down Pants- Performed by patient: Thread/unthread right pants leg Pants- Performed by helper: Thread/unthread left pants leg, Pull pants up/down   Non-skid slipper socks- Performed by helper: Don/doff right sock, Don/doff left sock               TED Hose - Performed by helper: Don/doff right TED hose, Don/doff left TED hose  Lower body assist  Assist for lower body dressing: Touching or steadying assistance (Pt > 75%) (max assist LB dressing)      Toileting Toileting Toileting activity did not occur: No continent bowel/bladder event   Toileting steps completed by helper: Adjust clothing prior to toileting, Performs perineal hygiene, Adjust clothing after toileting Toileting Assistive Devices: Grab bar or rail  Toileting assist Assist level: Two helpers   Transfers Chair/bed transfer Chair/bed transfer activity did not occur: Safety/medical concerns Chair/bed transfer method: Stand pivot Chair/bed transfer assist level: Moderate assist (Pt 50 - 74%/lift or lower) Chair/bed transfer assistive device: Armrests Mechanical lift: Stedy   Locomotion Ambulation Ambulation activity did not occur: Safety/medical concerns   Max distance: 6420ft Assist level: Moderate assist (Pt 50 - 74%)   Wheelchair   Type: Manual Max wheelchair distance: 16200ft Assist Level: Moderate assistance (Pt 50 - 74%)  Cognition Comprehension Comprehension assist level: Understands basic 90% of the time/cues < 10% of the time  Expression Expression assist level: Expresses basic 75 - 89% of the time/requires cueing 10 - 24% of the time. Needs helper to occlude trach/needs to repeat words.  Social Interaction Social Interaction assist level: Interacts appropriately 75 - 89% of the time - Needs redirection for appropriate language or to initiate interaction.  Problem Solving Problem solving assist level: Solves basic 75 - 89% of the time/requires cueing 10 - 24% of the time  Memory Memory assist level: Recognizes or recalls 50 - 74% of the time/requires cueing 25 - 49% of the time    Medical Problem List and Plan: 1.  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  DVT Prophylaxis/Anticoagulation: Mechanical: Sequential compression devices, below knee Bilateral lower extremities 3. Pain Management:  Tylenol prn 4. Mood: LCSW to follow for evaluation and support.  5. Neuropsych: This patient is not fully capable of making decisions on her own behalf. 6. Skin/Wound Care: Ordered air mattress overlay due to malnutrition, poor intake   Moisture associated sacral ulcer, pressure relief, keep dry, protein supplementation             Will also try to keep pt on side 7. Fluids/Electrolytes/Nutrition:  Continue dextrose for now.   Liquid diet at present, will advance as tolerated 8. HIV/AIDS with MAC?:  On descovy, ticovey  Biaxin and ethambutol d/ced per ID on 8/9.   Follow up per ID. Appreciate recs 9. Candida/CMV esophagitis: Diflucan and on Ganciclovir since 08/03 for 21 day therapy/while inpatient be changed to PO 900 mg of valcyte BID at discharge per ID.  Appreciate recs.  Trial off fluconazole after 14 days per ID 10. FTT: Continue IVF for now. Started calorie count .   11. Potential for pancytopenia/fluctuation:  Check labs periodically given medications  GCSF to support marrow at least twice a week per ID 12. Hyponatremia/Hypokalemia: Monitor lytes with Mg and Phos levels every 3-4 days.   Na+ 136 on 8/17: Will cont to monitor, treat if necessary  K+ 4.3 on 8/17.   Cont to monitor  13. PCP prophylaxis: Bactrim discontinued due to hyponatremia. Has been treated for PCP infection and dapsone resumed 8/4  for prophylaxis.  14. Hypoalbuminemia  Supplement started 8/6 15. Leukocytosis: recurrent, afebrile- will contact ID if pt becomes febrile 16. Anemia of chronic disease  Hb 8.3 on 8/17  Cont to monitor 17. Orthostasis: Appears to be improving  TEDS, abdominal binder  IVF qHS x2 days  Encouraged more fluids  LOS (Days) 16 A FACE TO FACE EVALUATION WAS PERFORMED  KIRSTEINS,ANDREW E 12/11/2015 9:09 AM

## 2015-12-11 NOTE — Progress Notes (Signed)
Occupational Therapy Session Note  Patient Details  Name: Samantha Kline MRN: [id number] Date of Birth: 1961-04-16  Today's Date: 12/11/2015 OT Individual Time:  - 1000-1115  (75 min)       Short Term Goals: Week 1:  OT Short Term Goal 1 (Week 1): Pt will complete functional toilet transfer with Max A OT Short Term Goal 1 - Progress (Week 1): Met OT Short Term Goal 2 (Week 1): Pt will complete UB bathing with Min A OT Short Term Goal 2 - Progress (Week 1): Met OT Short Term Goal 3 (Week 1): Pt will complete sit to stand for LB bathing with Max A OT Short Term Goal 3 - Progress (Week 1): Met OT Short Term Goal 4 (Week 1): Pt will engage in 5 minutes of therapeutic activity without rest to increase activity tolerance for self care completion  OT Short Term Goal 4 - Progress (Week 1): Progressing toward goal Week 2:  OT Short Term Goal 1 (Week 2): Pt will complete functional toilet transfer with Max A OT Short Term Goal 1 - Progress (Week 2): Met OT Short Term Goal 2 (Week 2): Pt will complete UB bathing with Min A OT Short Term Goal 2 - Progress (Week 2): Met OT Short Term Goal 3 (Week 2): Pt will complete sit to stand for LB bathing with Max A OT Short Term Goal 3 - Progress (Week 2): Met OT Short Term Goal 4 (Week 2): Pt will engage in 5 minutes of therapeutic activity without rest to increase activity tolerance for self-care tasks OT Short Term Goal 4 - Progress (Week 2): Progressing toward goal  Skilled Therapeutic Interventions/Progress Updates:     Supine  Bp 11057:  HR= 87; 02=100% OT session focused on LB dressing, sitting balance, strengthening, and activity tolerance. Pt received supine in bed requesting she had several accidents last night and stated she did not need to do any bathing.  Pt did not have any clean clothes.  Pt donned teds and footies with mod assist.  Pt performed roll to left and supine to sit with SBA and increased time   OT donned abdominal binder. Pt verbalized  increaed dizziness.  BP was 106/67 with [id number]. Provided 5 min  rest break before transferring to wc.   Went from sit to stand and stood for 40 sec before sitting down due to increased dizziness.  Bp=100/58; HR = 115.  Rested for 2-3 min.  Decreased symptoms. BP= 101/63.  Pt reported decreased dizziness.  Min assist transfer to wc.   Engaged in UE exercises for 5 min with no problems.  Transferred to regular toilet with mod assist with sit to stand  using grab bar.  Pt performed peeri care with min assit.   Session focused on bed mobility, donning ted and footies, sitting tolerance, sitting balance, transfers, strengthening, and activity tolerance.   Therapy Documentation Precautions:  Precautions Precautions: Fall Restrictions Weight Bearing Restrictions: No General:   Vital Signs: Therapy Vitals Temp: 97.9 F (36.6 C) Temp Source: Oral Resp: 20 Oxygen Therapy SpO2: 99 % O2 Device: Not Delivered Pain:3/10   ADL: ADL Grooming: Setup, Contact guard Where Assessed-Grooming: Sitting at sink, Wheelchair Upper Body Bathing: Moderate assistance Where Assessed-Upper Body Bathing: Sitting at sink Lower Body Bathing: Maximal assistance Where Assessed-Lower Body Bathing: Standing at sink Upper Body Dressing: Maximal assistance Where Assessed-Upper Body Dressing: Edge of bed Lower Body Dressing: Maximal assistance Where Assessed-Lower Body Dressing: Wheelchair Toileting: Dependent Where Assessed-Toileting: Bedside Commode Toilet  Transfer: Moderate assistance Toilet Transfer Method: Stand pivot Toilet Transfer Equipment: Engineer, technical sales Transfer: Not assessed    Other Treatments:    See Function Navigator for Current Functional Status.   Therapy/Group: Individual Therapy  Lisa Roca 12/11/2015, 8:03 AM

## 2015-12-11 NOTE — Progress Notes (Signed)
Pharmacy Antibiotic Note  Ganciclovir per pharmacy  Samantha Brownis a54 y.o.femaleadmittedon 8/4/2017with CMV. Pt has been transferred to Rehab and Pharmacy has been consulted for continued Ganciclovir dosing.  SCr stable at 0.52. WBC up to 18.2, ANC 15.5 on 8/18. Afebrile. Today is day 19 of ganciclovir.  Plan: Continue ganciclovir 125 mg (2.5mg /kg)IV q12h Monitor weight Monitor renal function and CBC   Height: 5\' 4"  (162.6 cm) Weight: 102 lb (46.3 kg) IBW/kg (Calculated) : 54.7  Temp (24hrs), Avg:97.9 F (36.6 C), Min:97.9 F (36.6 C), Max:97.9 F (36.6 C)   Recent Labs Lab 12/05/15 0505 12/08/15 0516 12/09/15 0548  WBC 5.6 3.1* 18.2*  CREATININE 0.66 0.52  --     Estimated Creatinine Clearance: 57.4 mL/min (by C-G formula based on SCr of 0.8 mg/dL).    Allergies  Allergen Reactions  . Septra [Sulfamethoxazole-Trimethoprim]     Possible cause of hyponatremia     Thank you for allowing pharmacy to be a part of this patient's care.  Mackie Paienee Ackley, PharmD PGY1 Pharmacy Resident Pager: [phone] 12/11/2015 11:00 AM   I discussed / reviewed the pharmacy note by Dr. Rande LawmanAckley and I agree with the resident's findings and plans as documented.  Toys 'R' UsKimberly Trilby Way, Pharm.D., BCPS Clinical Pharmacist Pager [phone] 12/11/2015 1:12 PM

## 2015-12-11 NOTE — Plan of Care (Signed)
Problem: RH Stairs Goal: LTG Patient will ambulate up and down stairs w/assist (PT) LTG: Patient will ambulate up and down # of stairs with assistance (PT)  2 steps without rails & LRAD for home access

## 2015-12-11 NOTE — Progress Notes (Addendum)
Physical Therapy Weekly Progress Note  Patient Details  Name: Samantha Kline MRN: [id number] Date of Birth: 05/29/1960  Beginning of progress report period: December 04, 2015 End of progress report period: December 11, 2015  Today's Date: 12/11/2015 PT Individual Time: 2725-3664 PT Individual Time Calculation (min): 75 min    Patient has met 4 of 4 short term goals.  Pt is making functional gains due to improving activity tolerance. Pt has progressed towards ambulating up to 20 ft with RW with assistance. Pt continues to be limited by HR, BP & nausea with activity, but these are improving. Pt now with Ted hose & abdominal binder. Pt with improving motivation to participate in therapy and has initiated stair training. Family training & education has been limited & pt/family would benefit from 1 on 1 training.   Patient continues to demonstrate the following deficits: decreased standing balance, endurance & overall activity tolerance, standing balance, strength, decreased safety awareness and therefore will continue to benefit from skilled PT intervention to enhance overall performance with activity tolerance, balance, postural control, attention, awareness and family & pt education.  Patient progressing toward long term goals.. Long term goals updated based on pt's progress.  Continue plan of care.  PT Short Term Goals Week 2:  PT Short Term Goal 1 (Week 2): Pt will maintain static & dynamic sitting without back support consistently with  Mod A. PT Short Term Goal 1 - Progress (Week 2): Met PT Short Term Goal 2 (Week 2): Pt will transfer supine<>sitting with use of hospital bed features & mod A. PT Short Term Goal 2 - Progress (Week 2): Met PT Short Term Goal 3 (Week 2): Pt will perform bed<>chair transfers with consistent Max A +1.  PT Short Term Goal 3 - Progress (Week 2): Met PT Short Term Goal 4 (Week 2): Pt will ambulate 10 ft in parallel bars with Max A. PT Short Term Goal 4 - Progress  (Week 2): Met Week 3:  PT Short Term Goal 1 (Week 3): Pt will maintain standing balance with Mod A. PT Short Term Goal 2 (Week 3): Pt will demonstrate bed mobility (supine<>sit, rolling L<>R) consistently with supervision and hospital bed features.  PT Short Term Goal 3 (Week 3): Pt will ambulate 35 ft with LRAD & min A.  PT Short Term Goal 4 (Week 3): Pt will negotiate 4 steps (6") with B rails & min A for strengthening.   Skilled Therapeutic Interventions/Progress Updates:   Pt received in bed & agreeable to PT, denying c/o pain. Vitals while supine in bed: BP = 121/76 mmHg, HR = 106 bpm. Pt transferred supine>sitting EOB with use of bed rails & supervision. Pt requested to eat lunch & therapist provided supervision while pt sat at EOB x 25 minutes with & without UE support but did not experience any LOB. Therapist provided supervision while pt ate lunch tray, providing cues for small bites of food.   Vitals sitting: BP = 123/78 mmHg  HR = 120-125 bpm with sitting 25 minutes  Vitals standing: BP = 107/70 mmHg HR = 152 bpm  Pt transferred sit>stand from elevated bed with supervision and maintained standing x 1 minute with min guard A. Pt completed stand pivot bed>w/c with RW & mod A for standing balance 2/2 posterior LOB. Pt wished to get dressed & was able to thread pants on BLE, transfer to sit>stand with Min A and min A to complete pulling up pants. Pt able to don shirt with supervision A.  Transported pt room>gym via w/c total A for energy conservation. Gait training x 55 ft with Mod A & RW; pt demonstrating decreased pelvic rotation, scissoring gait, decreased balance during turns, and cuing to ambulate in base of RW. HR = 122 bpm after ambulating. Pt propelled w/c x 160 ft back to room with Min A to prevent pt from veering R. Pt required minimal questioning cues to utilize signs to find path to room. At end of session pt left sitting in w/c with QRB in place, all needs within reach & husband  present to supervise.  Addendum: Abdominal binder & ted hose donned upon PT arrival   Therapy Documentation Precautions:  Precautions Precautions: Fall Restrictions Weight Bearing Restrictions: No   See Function Navigator for Current Functional Status.  Therapy/Group: Individual Therapy  Waunita Schooner 12/11/2015, 2:40 PM

## 2015-12-11 NOTE — Progress Notes (Signed)
Dr. Wynn BankerKirsteins notified of and shown wounds on coccyx-stage II and current treatment as well as PVR over last 36 hours.

## 2015-12-11 NOTE — Plan of Care (Signed)
Problem: RH Ambulation Goal: LTG Patient will ambulate in controlled environment (PT) LTG: Patient will ambulate in a controlled environment, # of feet with assistance (PT).  50 ft with LRAD; upgrade 2/2 progress Goal: LTG Patient will ambulate in home environment (PT) LTG: Patient will ambulate in home environment, # of feet with assistance (PT).  50 ft with LRAD; upgrade 2/2 progress

## 2015-12-12 ENCOUNTER — Inpatient Hospital Stay (HOSPITAL_COMMUNITY): Payer: Managed Care, Other (non HMO) | Admitting: Physical Therapy

## 2015-12-12 ENCOUNTER — Inpatient Hospital Stay (HOSPITAL_COMMUNITY): Payer: Managed Care, Other (non HMO) | Admitting: Speech Pathology

## 2015-12-12 ENCOUNTER — Inpatient Hospital Stay (HOSPITAL_COMMUNITY): Payer: Managed Care, Other (non HMO) | Admitting: Occupational Therapy

## 2015-12-12 LAB — COMPREHENSIVE METABOLIC PANEL
ALT: 20 U/L (ref 14–54)
AST: 32 U/L (ref 15–41)
Albumin: 1.7 g/dL — ABNORMAL LOW (ref 3.5–5.0)
Alkaline Phosphatase: 99 U/L (ref 38–126)
Anion gap: 4 — ABNORMAL LOW (ref 5–15)
BUN: 16 mg/dL (ref 6–20)
CO2: 26 mmol/L (ref 22–32)
Calcium: 8 mg/dL — ABNORMAL LOW (ref 8.9–10.3)
Chloride: 105 mmol/L (ref 101–111)
Creatinine, Ser: 0.68 mg/dL (ref 0.44–1.00)
GFR calc Af Amer: >60 mL/min (ref >60)
GFR calc non Af Amer: >60 mL/min (ref >60)
Glucose, Bld: 70 mg/dL (ref 65–99)
Potassium: 4.4 mmol/L (ref 3.5–5.1)
Sodium: 135 mmol/L (ref 135–145)
Total Bilirubin: 0.3 mg/dL (ref 0.3–1.2)
Total Protein: 6.5 g/dL (ref 6.5–8.1)

## 2015-12-12 LAB — CBC WITH DIFFERENTIAL/PLATELET
Basophils Absolute: 0.1 10*3/uL (ref 0.0–0.1)
Basophils Relative: 2 %
Eosinophils Absolute: 0.1 10*3/uL (ref 0.0–0.7)
Eosinophils Relative: 1 %
HCT: 31.1 % — ABNORMAL LOW (ref 36.0–46.0)
Hemoglobin: 9.8 g/dL — ABNORMAL LOW (ref 12.0–15.0)
Lymphocytes Relative: 30 %
Lymphs Abs: 1.5 10*3/uL (ref 0.7–4.0)
MCH: 31.1 pg (ref 26.0–34.0)
MCHC: 31.5 g/dL (ref 30.0–36.0)
MCV: 98.7 fL (ref 78.0–100.0)
Monocytes Absolute: 0.4 10*3/uL (ref 0.1–1.0)
Monocytes Relative: 9 %
Neutro Abs: 2.8 10*3/uL (ref 1.7–7.7)
Neutrophils Relative %: 58 %
Platelets: 324 10*3/uL (ref 150–400)
RBC: 3.15 MIL/uL — ABNORMAL LOW (ref 3.87–5.11)
RDW: 17.7 % — ABNORMAL HIGH (ref 11.5–15.5)
WBC: 4.8 10*3/uL (ref 4.0–10.5)

## 2015-12-12 NOTE — Plan of Care (Signed)
Problem: RH BLADDER ELIMINATION Goal: RH STG MANAGE BLADDER WITH ASSISTANCE STG Manage Bladder With mod Assistance  Outcome: Not Progressing Total assist- incont    Problem: RH SAFETY Goal: RH STG DECREASED RISK OF FALL WITH ASSISTANCE STG Decreased Risk of Fall With Assistance.  Outcome: Progressing Mod assist

## 2015-12-12 NOTE — Plan of Care (Signed)
Problem: RH Balance Goal: LTG Patient will maintain dynamic sitting balance (PT) LTG:  Patient will maintain dynamic sitting balance with assistance during mobility activities (PT)  Upgrade 2/2 progress Goal: LTG Patient will maintain dynamic standing balance (PT) LTG:  Patient will maintain dynamic standing balance with assistance during mobility activities (PT)  With LRAD; Upgrade 2/2 progress

## 2015-12-12 NOTE — Progress Notes (Signed)
Occupational Therapy Session Note  Patient Details  Name: Samantha Kline MRN: [id number] Date of Birth: 04-19-1961  Today's Date: 12/12/2015 OT Individual Time: 1003-1057 OT Individual Time Calculation (min): 54 min    Short Term Goals: Week 2:  OT Short Term Goal 1 (Week 2): Pt will complete functional toilet transfer with Max A OT Short Term Goal 1 - Progress (Week 2): Met OT Short Term Goal 2 (Week 2): Pt will complete UB bathing with Min A OT Short Term Goal 2 - Progress (Week 2): Met OT Short Term Goal 3 (Week 2): Pt will complete sit to stand for LB bathing with Max A OT Short Term Goal 3 - Progress (Week 2): Met OT Short Term Goal 4 (Week 2): Pt will engage in 5 minutes of therapeutic activity without rest to increase activity tolerance for self-care tasks OT Short Term Goal 4 - Progress (Week 2): Progressing toward goal  Skilled Therapeutic Interventions/Progress Updates:    Pt seen for OT treatment focused on ADL re-training, activity tolerance, general strengthening, functional mobility, dynamic standing, and functional transfers. Pt completed stand-pivot transfers with Min A, progressing to Mod A w/ fatigue. Pt completed bathing in shower w/ roll-in chair and min A for bathing tasks. Pt then completed UB dressing with set-up assist and LB dressing with Mod A, Les propped on stool, and multiple rest breaks. Pt took additional rest break after dressing, then ambulated 15 feet w/ RW in the room w/ Mod A and w/c follow. Pt left seated in w/c with safety belt on after session.    Therapy Documentation Precautions:  Precautions Precautions: Fall Restrictions Weight Bearing Restrictions: No Pain: Pain Assessment Pain Assessment: No/denies pain  See Function Navigator for Current Functional Status.   Therapy/Group: Individual Therapy  Valma Cava 12/12/2015, 4:11 PM

## 2015-12-12 NOTE — Plan of Care (Signed)
Problem: RH Bed to Chair Transfers Goal: LTG Patient will perform bed/chair transfers w/assist (PT) LTG: Patient will perform bed/chair transfers with assistance, with/without cues (PT).  With LRAD; upgrade 2/2 progress  Problem: RH Car Transfers Goal: LTG Patient will perform car transfers with assist (PT) LTG: Patient will perform car transfers with assistance (PT).  With LRAD; upgrade 2/2 progress

## 2015-12-12 NOTE — Progress Notes (Signed)
Physical Therapy Session Note  Patient Details  Name: Samantha Kline MRN: [id number] Date of Birth: 08-07-1960  Today's Date: 12/12/2015 PT Individual Time: [id number] and [id number] PT Individual Time Calculation (min): 59 min and 55 min    Short Term Goals: Week 3:  PT Short Term Goal 1 (Week 3): Pt will maintain standing balance with Mod A. PT Short Term Goal 2 (Week 3): Pt will demonstrate bed mobility (supine<>sit, rolling L<>R) consistently with supervision and hospital bed features.  PT Short Term Goal 3 (Week 3): Pt will ambulate 35 ft with LRAD & min A.  PT Short Term Goal 4 (Week 3): Pt will negotiate 4 steps (6") with B rails & min A for strengthening.  Skilled Therapeutic Interventions/Progress Updates:    Treatment 1: Pt received in bed & agreeable to PT, denying c/o pain. PT donned ted hose total A & abdominal binder max A. Pt requested to eat breakfast and did so sitting up in bed and then sitting at EOB. Pt transferred supine>sitting EOB with supervision and use of bed features. Pt required cuing to chew & swallow food as she would attempt to hold food in cheek. Pt tolerated sitting at EOB without back support x 10 minutes to eat breakfast and to allow blood draw from phlebotomist. After sitting EOB x 10 minutes vitals were: HR = 105 bpm, SpO2 = 100%, BP = 115/82 mmHg but pt c/o dizziness & returned to supine with cuing for positioning and min A. [facility] completed stand pivot bed<>BSC with RW & min/mod A for overall standing balance & max verbal cuing for sequencing. Pt had continent void & performed hygiene with supervision while sitting on BSC. Pt returned to edge of bed & requested to finish eating breakfast. Therapist provided supervision and cuing to take small bites and swallow one bite before taking the next. Pt able to sit at EOB an additional 15 minutes without UE support to eat breakfast. Pt completed transfer bed>w/c via stand pivot with RW &  decreased cuing for sequencing. Pt does require max verbal cuing for proper hand placement for all sit<>stand transfers. At end of session pt left in w/c with QRB in place & all needs within reach.   Treatment 2: Pt received in w/c & agreeable to PT, denying c/o pain. RN present to apply cream to pt's buttocks. PT donned pt's abdominal binder total A for time management & pt completed sit<>stand transfer with steady A. Pt stood x 30 seconds with steady A & afterwards vitals were: HR = 118-127 bpm, BP = 111/74 mmHg, SpO2 = 100% on room air. Notified RN of pt's elevated HR at rest. After rest break & pursed lip breathing pt's HR = 121-132 bpm at rest & MD made aware. Per MD, minimize pt's physical exertion during treatment session 2/2 increased HR. Remainder of session focused on bed mobility, bed<>BSC transfers, and sit<>stand transfers. Pt requires cuing for proper hand placement during transfers as pt has no carryover during session. Pt completed stand pivot bed<>BSC with RW & mod A for balance; pt requires maximum cuing to square up to seat. Pt was continent of urine & BM. Pt tolerated standing x 1-2 minutes for PT to perform peri hygiene & don brief total A.  Instructed pt in log rolling to assist her in transferring sitting>supine with supervision & bed rails. Pt able to scoot to head of bed with maximum verbal cuing for technique & sequence. At end of session  pt left in bed with all needs within reach.   Therapy Documentation Precautions:  Precautions Precautions: Fall Restrictions Weight Bearing Restrictions: No    Pain: Pain Assessment Pain Assessment: No/denies pain   See Function Navigator for Current Functional Status.   Therapy/Group: Individual Therapy  Sandi MariscalVictoria M Parag Dorton 12/12/2015, 7:47 AM

## 2015-12-12 NOTE — Plan of Care (Signed)
Problem: RH Balance Goal: LTG Patient will maintain dynamic standing balance (PT) LTG:  Patient will maintain dynamic standing balance with assistance during mobility activities (PT)  With LRAD; downgrade 2/2 slow progress

## 2015-12-12 NOTE — Progress Notes (Signed)
Speech Language Pathology Weekly Progress and Session Note  Patient Details  Name: Samantha Kline MRN: [id number] Date of Birth: [birth year]  Beginning of progress report period: 12/02/2015   End of progress report period:  12/12/2015  Today's Date: 12/12/2015 SLP Individual Time: 1135-1201 SLP Individual Time Calculation (min): 26 min   Short Term Goals:Week 2: SLP Short Term Goal 1 (Week 2): Patient will initiate verbal expression of basic wants and needs with Min question cues  SLP Short Term Goal 1 - Progress (Week 2): Met SLP Short Term Goal 2 (Week 2): Patient will problem solve use of call bell with Min assist verbal and visual cues  SLP Short Term Goal 2 - Progress (Week 2): Met SLP Short Term Goal 3 (Week 2): Patient will initiate self-care tasks in under 15 seconds with no more than 2 verbal cues  SLP Short Term Goal 3 - Progress (Week 2): Met SLP Short Term Goal 4 (Week 2): Patient will utilize external aids to recall daily information with Min assist cues SLP Short Term Goal 4 - Progress (Week 2): Met SLP Short Term Goal 5 (Week 2): Patient will demonstrate sustained attention to familiar tasks for 5 minutes with Mod verbal cues for redirection  SLP Short Term Goal 5 - Progress (Week 2): Met SLP Short Term Goal 6 (Week 2): Pt will consume 25% of full liquids diet with mod encouragement SLP Short Term Goal 6 - Progress (Week 2): Met    New Short Term Goals: Week 3: SLP Short Term Goal 1 (Week 3): Pt will utilize increased vocal intensity to achieve intelligibility at the sentence level with min assist verbal cues.  SLP Short Term Goal 2 (Week 3): Patient will problem solve use of call bell with supervision verbal cues  SLP Short Term Goal 3 (Week 3): Patient will utilize external aids to recall daily information with supervision cues.  SLP Short Term Goal 4 (Week 3): Patient will demonstrate sustained attention to familiar tasks for 5 minutes with supervision verbal cues for  redirection  SLP Short Term Goal 5 (Week 3): Pt will consume soft diet with mod I use of standard swallowing precautions and minimal overt s/s of aspiration.    Weekly Progress Updates:  Pt has made excellent gains this reporting period and has met 6 out of 6 short term goals.  Pt is now consuming soft solid food and has had steadily improving PO intake and appetite.  Pt has also demonstrated improved sustained attention to tasks which has improved her functional recall of daily information and functional problem solving.   Pt requires min assist during basic, familiar tasks due to mild-moderate cognitive impairment.  Pt would continue to benefit from skilled ST while inpatient in order to maximize functional independence and reduce burden of care prior to discharge.     Intensity: Minumum of 1-2 x/day, 30 to 90 minutes Frequency: 3 to 5 out of 7 days Duration/Length of Stay: 12-16 days Treatment/Interventions: Cognitive remediation/compensation;Cueing hierarchy;Functional tasks;Environmental controls;Dysphagia/aspiration precaution training;Internal/external aids;Speech/Language facilitation;Therapeutic Activities   Daily Session  Skilled Therapeutic Interventions:  Pt was seen for skilled ST targeting dysphagia goals.  SLP completed skilled observations during presentations of pt's recently upgraded diet to assess toleration. Pt consumed soft solids with mod I use of swallowing precautions following initial tray set up.  No overt s/s of aspiration were evident with solids or liquids and pt was able to clear solids from oral cavity in a timely manner without difficulty or evidence of oral  holding/residue.  As a result, recommend that pt be upgraded to intermittent supervision at meals.  Goals updated on this date to reflect current progress and plan of care.     Function:   Eating Eating   Modified Consistency Diet: Yes Eating Assist Level: Set up assist for   Eating Set Up Assist For:  Opening containers       Cognition Comprehension Comprehension assist level: Follows basic conversation/direction with extra time/assistive device  Expression   Expression assist level: Expresses basic 75 - 89% of the time/requires cueing 10 - 24% of the time. Needs helper to occlude trach/needs to repeat words.  Social Interaction Social Interaction assist level: Interacts appropriately 90% of the time - Needs monitoring or encouragement for participation or interaction.  Problem Solving Problem solving assist level: Solves basic 75 - 89% of the time/requires cueing 10 - 24% of the time  Memory Memory assist level: Recognizes or recalls 50 - 74% of the time/requires cueing 25 - 49% of the time   General    Pain Pain Assessment Pain Assessment: No/denies pain  Therapy/Group: Individual Therapy  Johnna Bollier, Selinda Orion 12/12/2015, 2:23 PM

## 2015-12-12 NOTE — Progress Notes (Signed)
[facility]     PROGRESS NOTE  Subjective/Complaints:  Pt laying in bed this AM.  She states she had a great weekend and she mentions that she is eating and drinking better.    ROS: Denies CP, SOB, N/V/D.  Objective: Vital Signs: Blood pressure 114/64, pulse 100, temperature 98.1 F (36.7 C), temperature source Oral, resp. rate 16, height 5\' 4"  (1.626 m), weight 46.3 kg (102 lb), SpO2 96 %. No results found.  Recent Labs  12/12/15 0818  WBC 4.8  HGB 9.8*  HCT 31.1*  PLT 324    Recent Labs  12/12/15 0818  NA 135  K 4.4  CL 105  GLUCOSE 70  BUN 16  CREATININE 0.68  CALCIUM 8.0*   CBG (last 3)  No results for input(s): GLUCAP in the last 72 hours.  Wt Readings from Last 3 Encounters:  12/11/15 46.3 kg (102 lb)  11/23/15 60.6 kg (133 lb 11.2 oz)  10/31/15 58.6 kg (129 lb 3 oz)    Physical Exam:  BP 114/64 (BP Location: Left Arm)   Pulse 100   Temp 98.1 F (36.7 C) (Oral)   Resp 16   Ht 5\' 4"  (1.626 m)   Wt 46.3 kg (102 lb)   SpO2 96%   BMI 17.51 kg/m  Constitutional: Vital signs are normal. NAD. She appears cachectic. She has a sickly appearance.  HENT: Normocephalic and atraumatic.  Eyes: Conjunctivae and EOM are normal.  Cardiovascular: Regular rate and rhythm. No murmur heard. Respiratory: Effort normal and breath sounds normal. No stridor. No respiratory distress. She has no wheezes.  GI: Soft. Bowel sounds are normal. She exhibits no distension. There is no tenderness.  Musculoskeletal: She exhibits no edema or tenderness.  Muscle wasting all limbs.  Neurological:  A&O X3. Motor:  B/l UE: 4/5 deltoid, biceps, triceps, 4/5 wrist and HI.  B/l LE: 4-/5 HF, 4+/5 KE/KF and 4+/5 ADF/PF.  Skin: Skin is warm and dry. No rash noted. No erythema.  3 small moisture associated ulcers sacrum area  Psychiatric: Her affect is blunt. She is slowed. Cognition and memory are impaired.   Assessment/Plan: 1. 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 Physiatrist is providing close team supervision and 24 hour management of active medical problems listed below. Physiatrist and rehab team continue to assess barriers to discharge/monitor patient progress toward functional and medical goals.  Function:  Bathing Bathing position   Position: Shower  Bathing parts Body parts bathed by patient: Chest, Right arm, Left arm, Left upper leg, Right upper leg Body parts bathed by helper: Left lower leg, Right lower leg, Buttocks, Front perineal area  Bathing assist Assist Level: 2 helpers   Set up : To obtain items  Upper Body Dressing/Undressing Upper body dressing   What is the patient wearing?: Pull over shirt/dress     Pull over shirt/dress - Perfomed by patient: Thread/unthread right sleeve, Thread/unthread left sleeve, Pull shirt over trunk, Put head through opening Pull over shirt/dress - Perfomed by helper: Put head through opening Button up shirt - Perfomed by patient: Thread/unthread left sleeve Button up shirt - Perfomed by helper: Thread/unthread right sleeve, Pull shirt around back, Button/unbutton shirt    Upper body assist Assist Level: Set up   Set up : To obtain clothing/put away  Lower Body Dressing/Undressing Lower body dressing   What is the patient wearing?: Non-skid slipper socks,  Pants, Socks, [employer] - Performed by helper: Thread/unthread right underwear leg, Thread/unthread left underwear leg, Pull underwear up/down Pants- Performed by patient: Thread/unthread right pants leg Pants- Performed by helper: Thread/unthread left pants leg, Pull pants up/down   Non-skid slipper socks- Performed by helper: Don/doff right sock, Don/doff left sock               TED Hose - Performed by helper: Don/doff right TED hose, Don/doff left TED hose  Lower body assist Assist for  lower body dressing: Touching or steadying assistance (Pt > 75%) (max assist LB dressing)      Toileting Toileting Toileting activity did not occur: No continent bowel/bladder event Toileting steps completed by patient: Adjust clothing prior to toileting, Performs perineal hygiene Toileting steps completed by helper: Adjust clothing after toileting Toileting Assistive Devices: Grab bar or rail  Toileting assist Assist level: Touching or steadying assistance (Pt.75%)   Transfers Chair/bed transfer Chair/bed transfer activity did not occur: Safety/medical concerns Chair/bed transfer method: Stand pivot Chair/bed transfer assist level: Moderate assist (Pt 50 - 74%/lift or lower) Chair/bed transfer assistive device: Walker Mechanical lift: Landscape architect Ambulation activity did not occur: Safety/medical concerns   Max distance: 55 ft Assist level: Moderate assist (Pt 50 - 74%)   Wheelchair   Type: Manual Max wheelchair distance: 160 ft  Assist Level: Touching or steadying assistance (Pt > 75%)  Cognition Comprehension Comprehension assist level: Understands basic 90% of the time/cues < 10% of the time  Expression Expression assist level: Expresses basic 75 - 89% of the time/requires cueing 10 - 24% of the time. Needs helper to occlude trach/needs to repeat words.  Social Interaction Social Interaction assist level: Interacts appropriately 75 - 89% of the time - Needs redirection for appropriate language or to initiate interaction.  Problem Solving Problem solving assist level: Solves basic 75 - 89% of the time/requires cueing 10 - 24% of the time  Memory Memory assist level: Recognizes or recalls 50 - 74% of the time/requires cueing 25 - 49% of the time    Medical Problem List and Plan: 1.  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  DVT Prophylaxis/Anticoagulation: Mechanical: Sequential compression devices, below  knee Bilateral lower extremities 3. Pain Management: Tylenol prn 4. Mood: LCSW to follow for evaluation and support.  5. Neuropsych: This patient is not fully capable of making decisions on her own behalf. 6. Skin/Wound Care: Ordered air mattress overlay due to malnutrition, poor intake   Moisture associated sacral ulcer, pressure relief, keep dry, protein supplementation 7. Fluids/Electrolytes/Nutrition:  Continue dextrose for now.   Liquid diet advanced to soft diet at present, will cont to advance as tolerated 8. HIV/AIDS with MAC?:  On descovy, ticovey  Biaxin and ethambutol d/ced per ID on 8/9.   Follow up per ID. Appreciate recs 9. Candida/CMV esophagitis: Diflucan and on Ganciclovir since 08/03 for 21 day therapy/while inpatient be changed to PO 900 mg of valcyte BID at discharge per ID.  Appreciate recs.  Trial off fluconazole after 14 days per ID (~8/23) 10. FTT: Continue IVF for now.   Diet improving  11. Potential for pancytopenia/fluctuation:  Check labs periodically given medications  GCSF to support marrow at least twice a week per ID 12. Hyponatremia/Hypokalemia: Monitor lytes with Mg and Phos levels every 3-4 days.   Na+ 135 on 8/21: Will cont to monitor, treat if necessary  K+ 4.4 on 8/21.  Cont to monitor 13. PCP prophylaxis: Bactrim discontinued due to hyponatremia. Has been treated for PCP infection and dapsone resumed 8/4  for prophylaxis.  14. Hypoalbuminemia  Supplement started 8/6 15. Leukocytosis: recurrent, afebrile- will contact ID if pt becomes febrile 16. Anemia of chronic disease  Hb 9.8 on 8/21  Cont to monitor 17. Orthostasis: Appears to be improving  TEDS, abdominal binder  IVF qHS x2 days  Encouraged more fluids  LOS (Days) 17 A FACE TO FACE EVALUATION WAS PERFORMED  Samantha Kline 12/12/2015 9:58 AM

## 2015-12-13 ENCOUNTER — Inpatient Hospital Stay (HOSPITAL_COMMUNITY): Payer: Managed Care, Other (non HMO) | Admitting: Occupational Therapy

## 2015-12-13 ENCOUNTER — Inpatient Hospital Stay (HOSPITAL_COMMUNITY): Payer: Managed Care, Other (non HMO) | Admitting: Physical Therapy

## 2015-12-13 ENCOUNTER — Inpatient Hospital Stay (HOSPITAL_COMMUNITY): Payer: Managed Care, Other (non HMO) | Admitting: Speech Pathology

## 2015-12-13 MED ORDER — ENSURE ENLIVE PO LIQD
237.0000 mL | Freq: Two times a day (BID) | ORAL | Status: DC
  Administered 2015-12-15 – 2015-12-23 (×15): 237 mL via ORAL

## 2015-12-13 MED ORDER — PRO-STAT SUGAR FREE PO LIQD
30.0000 mL | Freq: Two times a day (BID) | ORAL | Status: DC
  Administered 2015-12-15 – 2015-12-23 (×16): 30 mL via ORAL
  Filled 2015-12-13 (×18): qty 30

## 2015-12-13 NOTE — Progress Notes (Signed)
Physical Therapy Session Note  Patient Details  Name: Lorna FewHythia Horlacher MRN: [id number] Date of Birth: [birth year]  Today's Date: 12/13/2015 PT Individual Time: [id number] PT Individual Time Calculation (min): 42 min    Skilled Therapeutic Interventions/Progress Updates:    Pt received in bed & agreeable to PT, denying c/o pain. Pt requested to use bathroom & therapist donned ted hose & abdominal binder total A for time management. Pt transferred supine>sitting EOB with supervision & use of bed rails with bed flat. Gait training x 10 ft bed>bathroom with Min A & RW with cuing for negotiating slight incline into bathroom. Pt performed toilet transfers with elevated commode, RW & supervision; (+) void & pt completed peri hygiene without assistance. Pt able to complete hand hygiene standing at sink with UE support and close supervision/steady A. After task pt's vitals were assessed & are noted below in flowsheet. Pt propelled manual w/c x 160 ft room>gym with Min A and maximal cuing to prevent pt from steering right and for path finding. Gait training in gym with RW & Min A overall with cuing for increased step width to prevent scissoring. Pt able to ambulate 100 ft with RW before requiring rest break 2/2 fatigue; after task pt's HR = 126 bpm. Pt propelled back to room in same manner as noted above. Pt left in w/c with QRB in place, husband present, and all needs within reach.   Therapy Documentation Precautions:  Precautions Precautions: Fall Restrictions Weight Bearing Restrictions: No   Vital Signs: Therapy Vitals Pulse Rate: (!) 116 BP: 117/75 Patient Position (if appropriate): Sitting Oxygen Therapy SpO2: 100 % O2 Device: Not Delivered   Pain: Pain Assessment Pain Assessment: No/denies pain    See Function Navigator for Current Functional Status.   Therapy/Group: Individual Therapy  Sandi MariscalVictoria M Taisia Fantini 12/13/2015, 12:27 PM

## 2015-12-13 NOTE — Progress Notes (Signed)
Physical Therapy Session Note  Patient Details  Name: Samantha Kline MRN: [id number] Date of Birth: [birth year]  Today's Date: 12/13/2015 PT Individual Time: 1130-1155 PT Individual Time Calculation (min): 25 min    Short Term Goals: Week 3:  PT Short Term Goal 1 (Week 3): Pt will maintain standing balance with Mod A. PT Short Term Goal 2 (Week 3): Pt will demonstrate bed mobility (supine<>sit, rolling L<>R) consistently with supervision and hospital bed features.  PT Short Term Goal 3 (Week 3): Pt will ambulate 35 ft with LRAD & min A.  PT Short Term Goal 4 (Week 3): Pt will negotiate 4 steps (6") with B rails & min A for strengthening.  Skilled Therapeutic Interventions/Progress Updates:    Pt received resting in w/c with no c/o pain and agreeable to therapy session with focus on activity tolerance and strengthening.  Pt performed stand/pivot w/c<>nustep with mod assist.  Nustep x10 minutes at level 2 with no rest break for reciprocal stepping pattern and overall cardiopulmonary endurance.  Pt reports some tightness in LEs and UEs towards the end of 10 minutes.  Pt returned to room at end of session and positioned in tilt in space w/c with call bell in reach and needs met.   Therapy Documentation Precautions:  Precautions Precautions: Fall Restrictions Weight Bearing Restrictions: No   See Function Navigator for Current Functional Status.   Therapy/Group: Individual Therapy  Earnest Conroy Penven-Crew 12/13/2015, 11:58 AM

## 2015-12-13 NOTE — Progress Notes (Signed)
Occupational Therapy Session Note  Patient Details  Name: Samantha Kline MRN: [id number] Date of Birth: [birth year]  Today's Date: 12/13/2015 OT Individual Time: 0905-1001 OT Individual Time Calculation (min): 56 min   Short Term Goals: Week 2:  OT Short Term Goal 1 (Week 2): Pt will complete functional toilet transfer with Max A OT Short Term Goal 1 - Progress (Week 2): Met OT Short Term Goal 2 (Week 2): Pt will complete UB bathing with Min A OT Short Term Goal 2 - Progress (Week 2): Met OT Short Term Goal 3 (Week 2): Pt will complete sit to stand for LB bathing with Max A OT Short Term Goal 3 - Progress (Week 2): Met OT Short Term Goal 4 (Week 2): Pt will engage in 5 minutes of therapeutic activity without rest to increase activity tolerance for self-care tasks OT Short Term Goal 4 - Progress (Week 2): Progressing toward goal  Skilled Therapeutic Interventions/Progress Updates:    Pt seen for skilled OT session today focused on functional shower and toilet transfers, activity tolerance, ADL/self-care training. Pt greeted in bed reporting she was incontinent of bladder in her brief, but felt the need to have BM . Pt transferred to EOB w/ Min A, then transferred to [facility] with [place]. Pt + of bowel and bladder, w/ Mod A required for toileting. Pt then completed stand-pivot transfer to roll-in shower chair with Min A and participated in bathing tasks with Min A. Pt slightly incontinent of bowel in the shower. She required Min A for LB dressing today with improved activity tolerance for multiple sit<>stands to complete dressing tasks. Pt left seated in w/c at end of session with snack set-up of toast and orange juice. Safety belt in place and calll bell within reach.   Therapy Documentation Precautions:  Precautions Precautions: Fall Restrictions Weight Bearing Restrictions: No Vital Signs: Therapy Vitals Pulse Rate: (!) 124 (sitting on BSC s/p BM) Pain: Pain Assessment Pain Assessment:  No/denies pain  See Function Navigator for Current Functional Status.  Therapy/Group: Individual Therapy  Valma Cava 12/13/2015, 4:01 PM

## 2015-12-13 NOTE — Progress Notes (Signed)
[place] PHYSICAL MEDICINE & REHABILITATION     PROGRESS NOTE  Subjective/Complaints:  Pt laying in bed this AM.  Seen during wound rounds.  She states she is sleeping, eating, and drinking better.   ROS: Denies CP, SOB, N/V/D.  Objective: Vital Signs: Blood pressure 101/60, pulse 64, temperature 98.4 F (36.9 C), temperature source Oral, resp. rate 16, height 5\' 4"  (1.626 m), weight 46.3 kg (102 lb), SpO2 98 %. No results found.  Recent Labs  12/12/15 0818  WBC 4.8  HGB 9.8*  HCT 31.1*  PLT 324    Recent Labs  12/12/15 0818  NA 135  K 4.4  CL 105  GLUCOSE 70  BUN 16  CREATININE 0.68  CALCIUM 8.0*   CBG (last 3)  No results for input(s): GLUCAP in the last 72 hours.  Wt Readings from Last 3 Encounters:  12/11/15 46.3 kg (102 lb)  11/23/15 60.6 kg (133 lb 11.2 oz)  10/31/15 58.6 kg (129 lb 3 oz)    Physical Exam:  BP 101/60 (BP Location: Right Arm)   Pulse 64   Temp 98.4 F (36.9 C) (Oral)   Resp 16   Ht 5\' 4"  (1.626 m)   Wt 46.3 kg (102 lb)   SpO2 98%   BMI 17.51 kg/m  Constitutional: Vital signs are normal. NAD. She appears cachectic. She has a sickly appearance.  HENT: Normocephalic and atraumatic.  Eyes: Conjunctivae and EOM are normal.  Cardiovascular: Regular rate and rhythm. No murmur heard. Respiratory: Effort normal and breath sounds normal. No stridor. No respiratory distress. She has no wheezes.  GI: Soft. Bowel sounds are normal. She exhibits no distension. There is no tenderness.  Musculoskeletal: She exhibits no edema or tenderness.  Muscle wasting all limbs.  Neurological:  A&O X3. Motor:  B/l UE: 4/5 deltoid, biceps, triceps, 4/5 wrist and HI.  B/l LE: 4-/5 HF, 4+/5 KE/KF and 4+/5 ADF/PF.  Skin: Skin is warm and dry. No rash noted. No erythema.  2 sacral decubitus ulcers with moisture associated ulcers healing.  Psychiatric: Her affect is blunt. She is slowed. Cognition and memory are impaired.   Assessment/Plan: 1. 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 Physiatrist is providing close team supervision and 24 hour management of active medical problems listed below. Physiatrist and rehab team continue to assess barriers to discharge/monitor patient progress toward functional and medical goals.  Function:  Bathing Bathing position   Position: Shower  Bathing parts Body parts bathed by patient: Left arm, Right arm, Chest, Abdomen, Right upper leg, Left upper leg, Right lower leg Body parts bathed by helper: Front perineal area, Buttocks, Left lower leg  Bathing assist Assist Level: Touching or steadying assistance(Pt > 75%)   Set up : To obtain items  Upper Body Dressing/Undressing Upper body dressing   What is the patient wearing?: Pull over shirt/dress     Pull over shirt/dress - Perfomed by patient: Thread/unthread right sleeve, Thread/unthread left sleeve, Put head through opening, Pull shirt over trunk Pull over shirt/dress - Perfomed by helper: Put head through opening Button up shirt - Perfomed by patient: Thread/unthread left sleeve Button up shirt - Perfomed by helper: Thread/unthread right sleeve, Pull shirt around back, Button/unbutton shirt    Upper body assist Assist Level: Set up, More than reasonable time   Set up : To obtain clothing/put away  Lower Body Dressing/Undressing Lower body dressing   What  is the patient wearing?: Non-skid slipper socks, Pants, Ted Hose   Underwear - Performed by helper: Thread/unthread right underwear leg, Thread/unthread left underwear leg, Pull underwear up/down Pants- Performed by patient: Thread/unthread right pants leg Pants- Performed by helper: Pull pants up/down, Thread/unthread left pants leg Non-skid slipper socks- Performed by patient: Don/doff left sock, Don/doff right sock Non-skid slipper socks- Performed by helper: Don/doff right  sock, Don/doff left sock               TED Hose - Performed by helper: Don/doff right TED hose, Don/doff left TED hose  Lower body assist Assist for lower body dressing: Touching or steadying assistance (Pt > 75%)      Toileting Toileting Toileting activity did not occur: No continent bowel/bladder event Toileting steps completed by patient: Adjust clothing prior to toileting, Performs perineal hygiene Toileting steps completed by helper: Adjust clothing prior to toileting, Performs perineal hygiene, Adjust clothing after toileting Toileting Assistive Devices: Grab bar or rail  Toileting assist Assist level: Touching or steadying assistance (Pt.75%)   Transfers Chair/bed transfer Chair/bed transfer activity did not occur: Safety/medical concerns Chair/bed transfer method: Stand pivot Chair/bed transfer assist level: Moderate assist (Pt 50 - 74%/lift or lower) Chair/bed transfer assistive device: Walker Mechanical lift: Landscape architecttedy   Locomotion Ambulation Ambulation activity did not occur: Safety/medical concerns   Max distance: 55 ft Assist level: Moderate assist (Pt 50 - 74%)   Wheelchair   Type: Manual Max wheelchair distance: 160 ft  Assist Level: Touching or steadying assistance (Pt > 75%)  Cognition Comprehension Comprehension assist level: Follows basic conversation/direction with extra time/assistive device  Expression Expression assist level: Expresses basic 75 - 89% of the time/requires cueing 10 - 24% of the time. Needs helper to occlude trach/needs to repeat words.  Social Interaction Social Interaction assist level: Interacts appropriately 90% of the time - Needs monitoring or encouragement for participation or interaction.  Problem Solving Problem solving assist level: Solves basic 75 - 89% of the time/requires cueing 10 - 24% of the time  Memory Memory assist level: Recognizes or recalls 50 - 74% of the time/requires cueing 25 - 49% of the time    Medical Problem  List and Plan: 1.  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  DVT Prophylaxis/Anticoagulation: Mechanical: Sequential compression devices, below knee Bilateral lower extremities 3. Pain Management: Tylenol prn 4. Mood: LCSW to follow for evaluation and support.  5. Neuropsych: This patient is not fully capable of making decisions on her own behalf. 6. Skin/Wound Care: Ordered air mattress overlay due to malnutrition, poor intake   2 sacral decubiti, pressure relief, keep dry, protein supplementation, foam dressing 7. Fluids/Electrolytes/Nutrition:  Continue dextrose for now.   Liquid diet advanced to soft diet at present, will cont to advance as tolerated 8. HIV/AIDS with MAC?:  On descovy, ticovey  Biaxin and ethambutol d/ced per ID on 8/9.   Follow up per ID. Appreciate recs 9. Candida/CMV esophagitis: Diflucan and on Ganciclovir since 08/03 for 21 day therapy/while inpatient be changed to PO 900 mg of valcyte BID at discharge per ID.  Appreciate recs.  Trial off fluconazole after 14 days per ID (last day 8/21) 10. FTT: Continue IVF for now.   Appetite improving  11. Potential for pancytopenia/fluctuation:  Check labs periodically given medications  GCSF to support marrow at least twice a week per ID 12. Hyponatremia/Hypokalemia: Monitor lytes with Mg and Phos levels every 3-4 days.   Na+  135 on 8/21: Will cont to monitor, treat if necessary  K+ 4.4 on 8/21.   Cont to monitor 13. PCP prophylaxis: Bactrim discontinued due to hyponatremia. Has been treated for PCP infection and dapsone resumed 8/4  for prophylaxis.  14. Hypoalbuminemia  Supplement started 8/6 15. Leukocytosis: recurrent, afebrile- will contact ID if pt becomes febrile 16. Anemia of chronic disease  Hb 9.8 on 8/21  Cont to monitor 17. Orthostasis: Appears to be improving  TEDS, abdominal binder  IVF qHS x2 days  Encouraged more fluids  LOS (Days) 18 A FACE TO FACE  EVALUATION WAS PERFORMED  Samantha Kline 12/13/2015 9:22 AM

## 2015-12-13 NOTE — Progress Notes (Signed)
Speech Language Pathology Daily Session Note  Patient Details  Name: Samantha Kline MRN: [id number] Date of Birth: October 30, 1960  Today's Date: 12/13/2015 SLP Individual Time: 1049-1130 SLP Individual Time Calculation (min): 41 min   Short Term Goals:Week 3: SLP Short Term Goal 1 (Week 3): Pt will utilize increased vocal intensity to achieve intelligibility at the sentence level with min assist verbal cues.  SLP Short Term Goal 2 (Week 3): Patient will problem solve use of call bell with supervision verbal cues  SLP Short Term Goal 3 (Week 3): Patient will utilize external aids to recall daily information with supervision cues.  SLP Short Term Goal 4 (Week 3): Patient will demonstrate sustained attention to familiar tasks for 5 minutes with supervision verbal cues for redirection  SLP Short Term Goal 5 (Week 3): Pt will consume soft diet with mod I use of standard swallowing precautions over three targeted sessions and with  minimal overt s/s of aspiration.    Skilled Therapeutic Interventions:  Pt was seen for skilled ST targeting cognitive goals.  SLP facilitated the session with medication management tasks targeting functional problem solving.  Pt required min assist verbal cues to monitor and correct errors when counting money and making change due to decreased organization and working memory.  Pt was also able to complete medication management tasks from ALFA standardized cognitive assessment with min assist verbal cues for the same impairments as listed above.  Pt was able to sustain her attention to both tasks for their duration (~15 minutes each) with mod I.  Pt continues to make excellent progress towards goals.  Pt left in wheelchair with call bell within reach.  Continue per current plan of care.    Function:  Eating Eating                 Cognition Comprehension Comprehension assist level: Follows basic conversation/direction with extra time/assistive device  Expression    Expression assist level: Expresses basic 90% of the time/requires cueing < 10% of the time.  Social Interaction Social Interaction assist level: Interacts appropriately 90% of the time - Needs monitoring or encouragement for participation or interaction.  Problem Solving Problem solving assist level: Solves basic 75 - 89% of the time/requires cueing 10 - 24% of the time  Memory Memory assist level: Recognizes or recalls 50 - 74% of the time/requires cueing 25 - 49% of the time    Pain Pain Assessment Pain Assessment: No/denies pain  Therapy/Group: Individual Therapy  Georgianne Gritz, [name] 12/13/2015, 11:22 AM

## 2015-12-13 NOTE — Plan of Care (Signed)
Problem: RH Grooming Goal: LTG Patient will perform grooming w/assist,cues/equip (OT) LTG: Patient will perform grooming with assist, with/without cues using equipment (OT)  Goals upgraded 2/2 improved medical status, strength, endurance and activity tolerance.  Problem: RH Bathing Goal: LTG Patient will bathe with assist, cues/equipment (OT) LTG: Patient will bathe specified number of body parts with assist with/without cues using equipment (position)  (OT)  Goals upgraded 2/2 improved medical status, strength, endurance and activity tolerance.  Problem: RH Dressing Goal: LTG Patient will perform upper body dressing (OT) LTG Patient will perform upper body dressing with assist, with/without cues (OT).  Goals upgraded 2/2 improved medical status, strength, endurance and activity tolerance. Goal: LTG Patient will perform lower body dressing w/assist (OT) LTG: Patient will perform lower body dressing with assist, with/without cues in positioning using equipment (OT)  Goals upgraded 2/2 improved medical status, strength, endurance and activity tolerance.  Problem: RH Toileting Goal: LTG Patient will perform toileting w/assist, cues/equip (OT) LTG: Patient will perform toiletiing (clothes management/hygiene) with assist, with/without cues using equipment (OT)  Goals upgraded 2/2 improved medical status, strength, endurance and activity tolerance.  Problem: RH Toilet Transfers Goal: LTG Patient will perform toilet transfers w/assist (OT) LTG: Patient will perform toilet transfers with assist, with/without cues using equipment (OT)  Goals upgraded 2/2 improved medical status, strength, endurance and activity tolerance.  Problem: RH Tub/Shower Transfers Goal: LTG Patient will perform tub/shower transfers w/assist (OT) LTG: Patient will perform tub/shower transfers with assist, with/without cues using equipment (OT)  Goals upgraded 2/2 improved medical status, strength, endurance and activity  tolerance. -ESD  Comments: Goals upgraded 2/2 improved medical status, strength, endurance and activity tolerance.

## 2015-12-13 NOTE — Progress Notes (Signed)
Nutrition Follow-up  DOCUMENTATION CODES:   Non-severe (moderate) malnutrition in context of acute illness/injury  INTERVENTION:  Provide Ensure Enlive po BID, each supplement provides 350 kcal and 20 grams of protein.  Provide 30 ml Prostat po BID, each supplement provides 100 kcal and 15 grams of protein.   Encourage adequate PO intake.   NUTRITION DIAGNOSIS:   Inadequate oral intake related to lethargy/confusion, poor appetite as evidenced by meal completion < 25%; improving  GOAL:   Patient will meet greater than or equal to 90% of their needs; met  MONITOR:   PO intake, Supplement acceptance, Diet advancement, Labs, Weight trends, Skin, I & O's  REASON FOR ASSESSMENT:   Consult Calorie Count  ASSESSMENT:   55 y/o female who had originally presented on 7/3 with 50 lb weight loss over few months, anorexia, SOB, progressive weakness. Worked up for PCP PNA and diagnosed with AIDS. Went home on antiviral therapy but continued with n/v, poor intake and inability to walk. Readmitted on 7/17 with lethargy, confusion and weakness and severe hyponatremia (110). Developed candida esophagitis. Progressed, but intake has remained poor with refusal of PEG/NGT. Transferred to inpatient rehab on 8/4. RD consulted for calorie count/poor PO intake  Intake and appetite has been improving. Meal completion recently has been 50-80% with 80% po intake at breakfast this AM. Pt currently has Ensure and Prostat ordered and has been consuming them. RD to modify orders as intake has improved. Pt encouraged to eat her food at meals and to drink her supplements.   Labs and medications reviewed.   Diet Order:  DIET SOFT Room service appropriate? Yes; Fluid consistency: Thin  Skin:  Wound (see comment) (MASD on peri-rectal area)  Last BM:  8/22  Height:   Ht Readings from Last 1 Encounters:  11/25/15 '5\' 4"'$  (1.626 m)    Weight:   Wt Readings from Last 1 Encounters:  12/11/15 102 lb (46.3 kg)     Ideal Body Weight:  54.54 kg  BMI:  Body mass index is 17.51 kg/m.  Estimated Nutritional Needs:   Kcal:  1700-1900 (28-31 kcal/kg bw)  Protein:  84-96 g (1.4-1.6 g/kg bw)  Fluid:  >1.8 liters (30 ml/kg bw)  EDUCATION NEEDS:   No education needs identified at this time  Corrin Parker, MS, RD, LDN Pager # [phone] After hours/ weekend pager # [phone]

## 2015-12-14 ENCOUNTER — Inpatient Hospital Stay (HOSPITAL_COMMUNITY): Payer: Managed Care, Other (non HMO) | Admitting: Occupational Therapy

## 2015-12-14 ENCOUNTER — Inpatient Hospital Stay (HOSPITAL_COMMUNITY): Payer: Managed Care, Other (non HMO) | Admitting: Speech Pathology

## 2015-12-14 ENCOUNTER — Inpatient Hospital Stay (HOSPITAL_COMMUNITY): Payer: Managed Care, Other (non HMO) | Admitting: Physical Therapy

## 2015-12-14 NOTE — Progress Notes (Signed)
Pharmacy Antibiotic Note  Ganciclovir per pharmacy  Samantha Brownis a54 y.o.femaleadmittedon 8/4/2017with CMV. Pt has been transferred to Rehab and Pharmacy has been consulted for continued Ganciclovir dosing. SCr stable at 0.68. WBC is at 4.8 K, ANC 2.8 K on 8/21. Afebrile. Today is day 22 of ganciclovir.  Plan: Continue ganciclovir 125 mg (2.5mg /kg)IV q12h for now and follow up on switching to Valcyte since has been already on ganciclovir for more than 21 days Monitor weight Monitor renal function and CBC   Height: 5\' 4"  (162.6 cm) Weight: 102 lb (46.3 kg) IBW/kg (Calculated) : 54.7  Temp (24hrs), Avg:98.4 F (36.9 C), Min:98.2 F (36.8 C), Max:98.6 F (37 C)   Recent Labs Lab 12/08/15 0516 12/09/15 0548 12/12/15 0818  WBC 3.1* 18.2* 4.8  CREATININE 0.52  --  0.68    Estimated Creatinine Clearance: 57.4 mL/min (by C-G formula based on SCr of 0.8 mg/dL).    Allergies  Allergen Reactions  . Septra [Sulfamethoxazole-Trimethoprim]     Possible cause of hyponatremia     Thank you for allowing pharmacy to be a part of this patient's care.  Chosen Garron, Tsz-Yin 12/14/2015 8:31 AM

## 2015-12-14 NOTE — Progress Notes (Signed)
Chaplain presented to the patient to complete a Spiritual Care Consult for the patient in providing an Advance Directive for and her husband to complete.  Chaplain provided an detailed explanation of the Directive to the patient, and her husband, and asked that they read over it completely for any question they may have if any, and request additional assistance from the [place]. Follow-up with the patient will be at a time when they are ready to complete the Directive.  Spiritual care support for the patient will continue to assist with any specified needs of the patient. Chaplain Janell QuietAudrey Starlit Raburn [phone]

## 2015-12-14 NOTE — Patient Care Conference (Signed)
Inpatient RehabilitationTeam Conference and Plan of Care Update Date: 12/14/2015   Time: 3:05 PM    Patient Name: Samantha Kline      Medical Record Number: [id number]  Date of Birth: 10/11/1960 Sex: Female         Room/Bed: 4W24C/4W24C-01 Payor Info: Payor: [employer] / Plan: C[employer] MANAGED / Product Type: *No Product type* /    Admitting Diagnosis: Debility  Admit Date/Time:  11/25/2015  5:22 PM Admission Comments: No comment available   Primary Diagnosis:  Physical debility Principal Problem: Physical debility  Patient Active Problem List   Diagnosis Date Noted  . Abnormal laboratory test result   . Orthostasis   . Leukopenia   . Agitation   . Hypotension   . Anorexia   . Stage 2 skin ulcer of sacral region   . Mycobacterium avium complex (HCC)   . Thrush   . Palliative care encounter   . Goals of care, counseling/discussion   . Leukocytosis   . HIV encephalopathy (HCC)   . Benign essential HTN   . Dysphagia   . Antineoplastic chemotherapy induced pancytopenia (HCC)   . Drug-induced folate deficiency anemia   . Physical debility 11/25/2015  . Esophageal ulcer   . Malnutrition of moderate degree 11/22/2015  . Anemia due to bone marrow failure (HCC)   . Cytomegalovirus (CMV) viremia (HCC)   . Esophagitis, CMV   . FTT (failure to thrive) in adult   . Physical deconditioning   . AIDS (HCC)   . Candida esophagitis (HCC)   . FUO (fever of unknown origin)   . Arterial hypotension   . Tachypnea   . Tachycardia   . Anemia of chronic disease   . Hyperkalemia 11/07/2015  . Nausea without vomiting 11/07/2015  . Pressure ulcer 11/07/2015  . Elevated lactic acid level 11/07/2015  . Sepsis (HCC)   . Acute encephalopathy 11/06/2015  . Hyponatremia 11/06/2015  . Bilateral pneumonia   . Protein-calorie malnutrition, severe (HCC) 10/28/2015  . CAP (community acquired pneumonia) 10/27/2015  . Human immunodeficiency virus (HIV) disease (HCC)   . Acute hypoxemic respiratory failure  (HCC) 10/25/2015  . Hypokalemia   . 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 Anemia 10/24/2015  . Weight loss     Expected Discharge Date: Expected Discharge Date: 12/24/15  Team Members Present: Physician leading conference: Dr. 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 cont bowels  LBM: 12/13/2015  cont x2   tiemd toileting    Swallow/Nutrition/ Hydration   Advanced back to soft diet, tolerating with mod I now intermittent supervision   mod I   monitor toleration and discharge goal    ADL's   Min-Mod A bathing, dressing, Min A functional transfers, Min A standing balance  Goals upgraded to Min A bathing, Mod I UB and LB dressing, Min A toileting, Mod I grooming, Min A functional transfers  activity tolerance, standing balance, ADL retraining, endurance,    Mobility   mod A for stand pivot transfers with RW, supervision/steady  A for bed mobility with bed rails, ambulates up to 55 ft with RW & Mod A, can tolerate sitting in w/c x 4 hours, continues to be limited by elevated HR  min A for transfers (car, bed<>chair), min A standing balance, mod I sitting balance, supervision bed mobility, 2 steps without rails, LRAD & Mod A  endurance training, stand pivot training with RW, gait training, balance   Communication             Safety/Cognition/ Behavioral Observations  min assist   supervision upgraded  attention, basic problem solving,  recall of new information   Pain   no c/o pain   <3  monitor and assess qshift and PRN    Skin   stage 11 to sacrum- gerhardt and dimethicone cream in use   no further skin breakdown or infection this adminssion   assess qshift and PRN       *See Care Plan and progress notes for long and short-term goals.  Barriers to Discharge: hematological lability, wounds, cognition, abx and antifungals    Possible Resolutions to Barriers:  Follow labs, cont meds, pressure relief, therapies, orthostasis improved, gradually weaning IV meds    Discharge Planning/Teaching Needs:  Will need to have family begin coming in for education-insurance if doesn;t progress more will not allow her to stay until 9/5.      Team Discussion:  She has turned the corner this week-goals upgraded to min assist level-cognitive goals-supervision. Endurance and strengthening getting better. MD weaning off anti-fugal meds and off IV fluids.Abdominal binder and teds helping her orthostatic BP. Moved up discharge date to 9/2.  Revisions to Treatment Plan:  Upgraded goals to min and discharge date now 9/2.   Continued Need for Acute Rehabilitation Level of Care: The patient requires daily medical management by a physician with specialized training in physical medicine and rehabilitation for the following conditions: Daily direction of a multidisciplinary physical rehabilitation program to ensure safe treatment while eliciting the highest outcome that is of practical value to the patient.: Yes Daily medical management of patient stability for increased activity during participation in an intensive rehabilitation regime.: Yes Daily analysis of laboratory values and/or radiology reports with any subsequent need for medication adjustment of medical intervention for : Wound care problems;Other;Nutritional problems;Blood pressure problems  Lucy ChrisDupree, Sebastyan Snodgrass G 12/14/2015, 3:05 PM

## 2015-12-14 NOTE — Progress Notes (Signed)
Physical Therapy Session Note  Patient Details  Name: Samantha Kline MRN: [id number] Date of Birth: Jan 06, 1961  Today's Date: 12/14/2015 PT Individual Time:  801-859 Calculated PT Individual time: 58 min       Short Term Goals:  Week 2:  PT Short Term Goal 1 (Week 2): Pt will maintain static & dynamic sitting without back support consistently with  Mod A. PT Short Term Goal 1 - Progress (Week 2): Met PT Short Term Goal 2 (Week 2): Pt will transfer supine<>sitting with use of hospital bed features & mod A. PT Short Term Goal 2 - Progress (Week 2): Met PT Short Term Goal 3 (Week 2): Pt will perform bed<>chair transfers with consistent Max A +1.  PT Short Term Goal 3 - Progress (Week 2): Met PT Short Term Goal 4 (Week 2): Pt will ambulate 10 ft in parallel bars with Max A. PT Short Term Goal 4 - Progress (Week 2): Met Week 3:  PT Short Term Goal 1 (Week 3): Pt will maintain standing balance with Mod A. PT Short Term Goal 2 (Week 3): Pt will demonstrate bed mobility (supine<>sit, rolling L<>R) consistently with supervision and hospital bed features.  PT Short Term Goal 3 (Week 3): Pt will ambulate 35 ft with LRAD & min A.  PT Short Term Goal 4 (Week 3): Pt will negotiate 4 steps (6") with B rails & min A for strengthening.  Skilled Therapeutic Interventions/Progress Updates:     Patient received supine in bed with trade off from [employer].  PT assisted patient to don Ted hose and socks in supine with total A for time management. Supine>sit with HOB slightly elevated with supervision A. PT placed abdominal binder in sitting position.  Stand pivot transfer with RW and min A from PT to prevent posterior LOB. Patient reports slight dizziness with transfer; BP 120/83, HR 96bpm.  Patient transferred to rehab gym in [facility] for energy conservation due to emesis prior to therapy.   PT instructed patient in gait training for 68f with RW and min A from PT, as well as min cues to increased step width to  increase stabilty. Patient requested rest break after 574fwith increased irritation to stomach. HR assessed following gait at 109bpm.   PT instructed patient in static balance training to stand on 3 inch wedge for 1 min, and 1.5 min. PT required to provided intermittent min A and mod cues for anterior weight shift and increased use fo ankle strategy. Supervision A and only occasional cues improved posture and increased forward weight shift.  Dynamic balance on wedge for forward reach to place 8 clothes pins on cloths pin tree.   [facility]. WC mobility training back to room  For 18049fith supervision A from PT as well as mod cues for increased use of R UE to maintain straight trajectory. Patient performed stands pivot transfer to recliner [facility] and supervision A from PT with min cues for safety.  Patient left sitting in recliner with call bell in reach and all needs met.    Therapy Documentation Precautions:  Precautions Precautions: Fall Restrictions Weight Bearing Restrictions: No General:   Vital Signs: Therapy Vitals Temp: 98.2 F (36.8 C) Temp Source: Oral Pulse Rate: 74 Resp: 18 BP: 105/66 Patient Position (if appropriate): Lying Oxygen Therapy SpO2: 100 % O2 Device: Not Delivered Pain: Pain Assessment Pain Assessment: No/denies pain  See Function Navigator for Current Functional Status.   Therapy/Group:  Individual Therapy  Lorie Phenix 12/14/2015, 8:30 AM

## 2015-12-14 NOTE — Progress Notes (Signed)
[place] PHYSICAL MEDICINE & REHABILITATION     PROGRESS NOTE  Subjective/Complaints:  Pt laying in bed this AM.  She is more alert.  She has questions about what additionally she needs to do to be ready for discharge.   ROS: Denies CP, SOB, N/V/D.  Objective: Vital Signs: Blood pressure 105/66, pulse 74, temperature 98.2 F (36.8 C), temperature source Oral, resp. rate 18, height 5\' 4"  (1.626 m), weight 46.3 kg (102 lb), SpO2 100 %. No results found.  Recent Labs  12/12/15 0818  WBC 4.8  HGB 9.8*  HCT 31.1*  PLT 324    Recent Labs  12/12/15 0818  NA 135  K 4.4  CL 105  GLUCOSE 70  BUN 16  CREATININE 0.68  CALCIUM 8.0*   CBG (last 3)  No results for input(s): GLUCAP in the last 72 hours.  Wt Readings from Last 3 Encounters:  12/11/15 46.3 kg (102 lb)  11/23/15 60.6 kg (133 lb 11.2 oz)  10/31/15 58.6 kg (129 lb 3 oz)    Physical Exam:  BP 105/66 (BP Location: Right Arm)   Pulse 74   Temp 98.2 F (36.8 C) (Oral)   Resp 18   Ht 5\' 4"  (1.626 m)   Wt 46.3 kg (102 lb)   SpO2 100%   BMI 17.51 kg/m  Constitutional: Vital signs are normal. NAD. She appears cachectic. She has a sickly appearance.  HENT: Normocephalic and atraumatic.  Eyes: Conjunctivae and EOM are normal.  Cardiovascular: Regular rate and rhythm. No murmur heard. Respiratory: Effort normal and breath sounds normal. No stridor. No respiratory distress. She has no wheezes.  GI: Soft. Bowel sounds are normal. She exhibits no distension. There is no tenderness.  Musculoskeletal: She exhibits no edema or tenderness.  Muscle wasting all limbs.  Neurological:  A&O X3. Motor:  B/l UE: 4/5 deltoid, biceps, triceps, 4/5 wrist and HI.  B/l LE: 4-/5 HF, 4+/5 KE/KF and 4+/5 ADF/PF.  Skin: Skin is warm and dry. No rash noted. No erythema.  2 sacral decubitus ulcers with moisture associated ulcers healing.  Psychiatric: Her affect is blunt. She is slowed. Cognition and memory are impaired (improving).    Assessment/Plan: 1. 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 Physiatrist is providing close team supervision and 24 hour management of active medical problems listed below. Physiatrist and rehab team continue to assess barriers to discharge/monitor patient progress toward functional and medical goals.  Function:  Bathing Bathing position   Position: Shower  Bathing parts Body parts bathed by patient: Left arm, Right arm, Chest, Abdomen, Right upper leg, Left upper leg, Right lower leg, Left lower leg Body parts bathed by helper: Front perineal area, Buttocks  Bathing assist Assist Level: Touching or steadying assistance(Pt > 75%)   Set up : To obtain items  Upper Body Dressing/Undressing Upper body dressing   What is the patient wearing?: Pull over shirt/dress     Pull over shirt/dress - Perfomed by patient: Thread/unthread right sleeve, Thread/unthread left sleeve, Put head through opening, Pull shirt over trunk Pull over shirt/dress - Perfomed by helper: Put head through opening Button up shirt - Perfomed by patient: Thread/unthread left sleeve Button up shirt - Perfomed by helper: Thread/unthread right sleeve, Pull shirt around back, Button/unbutton shirt    Upper body assist Assist Level: Set up, More than reasonable time   Set up : To obtain clothing/put away  Lower Body  Dressing/Undressing Lower body dressing   What is the patient wearing?: Pants, Non-skid slipper socks   Underwear - Performed by helper: Thread/unthread right underwear leg, Thread/unthread left underwear leg, Pull underwear up/down Pants- Performed by patient: Thread/unthread right pants leg, Pull pants up/down Pants- Performed by helper: Thread/unthread left pants leg Non-skid slipper socks- Performed by patient: Don/doff left sock, Don/doff right sock Non-skid slipper socks- Performed by  helper: Don/doff right sock, Don/doff left sock               TED Hose - Performed by helper: Don/doff right TED hose, Don/doff left TED hose  Lower body assist Assist for lower body dressing: Touching or steadying assistance (Pt > 75%)      Toileting Toileting Toileting activity did not occur: No continent bowel/bladder event Toileting steps completed by patient: Adjust clothing prior to toileting, Performs perineal hygiene, Adjust clothing after toileting Toileting steps completed by helper: Adjust clothing after toileting, Adjust clothing prior to toileting Toileting Assistive Devices: Other (comment) (elevated seat & rails, RW)  Toileting assist Assist level: Touching or steadying assistance (Pt.75%)   Transfers Chair/bed transfer Chair/bed transfer activity did not occur: Safety/medical concerns Chair/bed transfer method: Stand pivot Chair/bed transfer assist level: Touching or steadying assistance (Pt > 75%) Chair/bed transfer assistive device: Armrests, Walker Mechanical lift: Landscape architect Ambulation activity did not occur: Safety/medical concerns   Max distance: 100 ft  Assist level: Touching or steadying assistance (Pt > 75%)   Wheelchair   Type: Manual Max wheelchair distance: 160 ft Assist Level: Touching or steadying assistance (Pt > 75%)  Cognition Comprehension Comprehension assist level: Follows basic conversation/direction with no assist  Expression Expression assist level: Expresses basic needs/ideas: With extra time/assistive device  Social Interaction Social Interaction assist level: Interacts appropriately 90% of the time - Needs monitoring or encouragement for participation or interaction.  Problem Solving Problem solving assist level: Solves basic 75 - 89% of the time/requires cueing 10 - 24% of the time  Memory Memory assist level: Recognizes or recalls 75 - 89% of the time/requires cueing 10 - 24% of the time    Medical Problem List  and Plan: 1.  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  DVT Prophylaxis/Anticoagulation: Mechanical: Sequential compression devices, below knee Bilateral lower extremities 3. Pain Management: Tylenol prn 4. Mood: LCSW to follow for evaluation and support.  5. Neuropsych: This patient is not fully capable of making decisions on her own behalf. 6. Skin/Wound Care: Ordered air mattress overlay due to malnutrition, poor intake   2 sacral decubiti, pressure relief, keep dry, protein supplementation, foam dressing 7. Fluids/Electrolytes/Nutrition:  Continue dextrose for now.   Liquid diet advanced to soft diet at present, will cont to advance as tolerated 8. HIV/AIDS with MAC?:  On descovy, ticovey  Biaxin and ethambutol d/ced per ID on 8/9.   Follow up per ID. Appreciate recs 9. Candida/CMV esophagitis: Diflucan and on Ganciclovir since 08/03 for 21 day therapy/while inpatient be changed to PO 900 mg of valcyte BID at discharge per ID.  Appreciate recs.  Trial off fluconazole after 14 days per ID (last day 8/21) 10. FTT: Continue IVF for now.   Appetite improving  11. Potential for pancytopenia/fluctuation:  Check labs periodically given medications  GCSF to support marrow at least twice a week per ID 12. Hyponatremia/Hypokalemia: Monitor lytes with Mg and Phos levels every 3-4 days.   Na+ 135 on 8/21: Will cont  to monitor, treat if necessary  K+ 4.4 on 8/21.   Cont to monitor 13. PCP prophylaxis: Bactrim discontinued due to hyponatremia. Has been treated for PCP infection and dapsone resumed 8/4  for prophylaxis.  14. Hypoalbuminemia  Supplement started 8/6 15. Leukocytosis: recurrent, afebrile- will contact ID if pt becomes febrile 16. Anemia of chronic disease  Hb 9.8 on 8/21  Cont to monitor 17. Orthostasis: Appears to be improving  TEDS, abdominal binder  IVF qHS completed for 3 days  Encouraged more fluids  LOS (Days) 19 A  FACE TO FACE EVALUATION WAS PERFORMED  Samantha Kline Karis Jubanil Dalvin Clipper 12/14/2015 8:46 AM

## 2015-12-14 NOTE — Progress Notes (Signed)
Speech Language Pathology Daily Session Note  Patient Details  Name: Lorna FewHythia Binford MRN: [id number] Date of Birth: 25-Dec-1960  Today's Date: 12/14/2015 SLP Individual Time: 1006-1032 SLP Individual Time Calculation (min): 26 min   Short Term Goals: Week 3: SLP Short Term Goal 1 (Week 3): Pt will utilize increased vocal intensity to achieve intelligibility at the sentence level with min assist verbal cues.  SLP Short Term Goal 2 (Week 3): Patient will problem solve use of call bell with supervision verbal cues  SLP Short Term Goal 3 (Week 3): Patient will utilize external aids to recall daily information with supervision cues.  SLP Short Term Goal 4 (Week 3): Patient will demonstrate sustained attention to familiar tasks for 5 minutes with supervision verbal cues for redirection  SLP Short Term Goal 5 (Week 3): Pt will consume soft diet with mod I use of standard swallowing precautions over three targeted sessions and with  minimal overt s/s of aspiration.    Skilled Therapeutic Interventions:  Pt was seen for skilled ST targeting cognitive goals. Pt was awake upon arrival, slightly lethargic and reported that she had nausea prior to therapist's arrival but RN had administered IV nausea meds which seemed to be helping to relieve her symptoms.  RN verified information.  Pt agreeable to participating in therapy.  SLP facilitated the session with skilled instruction in use of written aids to assist in recall of daily information.  Pt was able to record her therapist's names and at least 2 goals of therapies into a basic written aid with min assist verbal cues.  Pt was left in recliner with call bell within reach.  Continue per current plan of care.    Function:  Eating Eating                 Cognition Comprehension Comprehension assist level: Follows basic conversation/direction with no assist  Expression   Expression assist level: Expresses basic needs/ideas: With extra time/assistive  device  Social Interaction Social Interaction assist level: Interacts appropriately 90% of the time - Needs monitoring or encouragement for participation or interaction.  Problem Solving Problem solving assist level: Solves basic 75 - 89% of the time/requires cueing 10 - 24% of the time  Memory Memory assist level: Recognizes or recalls 50 - 74% of the time/requires cueing 25 - 49% of the time    Pain Pain Assessment Pain Assessment: No/denies pain  Therapy/Group: Individual Therapy  Daanya Lanphier, [name] 12/14/2015, 10:45 AM

## 2015-12-14 NOTE — Progress Notes (Signed)
Occupational Therapy Weekly and Daily Progress Note  Patient Details  Name: Samantha Kline MRN: [id number] Date of Birth: May 26, 1960  Beginning of progress report period: November 26, 2015 End of progress report period: December 14, 2015  Today's Date: 12/14/2015 OT Individual Time: 4580-9983 OT Individual Time Calculation (min): 60 min     Patient has met 5 of 5 short term goals.  Pt has made great progress this week. She has demonstrated improved overall strength and activity tolerance, as well as improved medical status. She is eating, has more energy, and can feel herself getting better and stronger daily. Pt is participating in functional tasks with decreased rest breaks, she is ambulating with the RW w/ Mod A, and participating in ADL tasks with Mod>min A.   Patient continues to demonstrate the following deficits: muscle weakness, decreased endurance/low activity tolerance, and decreased standing balance and therefore will continue to benefit from skilled OT intervention to enhance overall performance with BADL.  Patient progressing toward long term goals..  Continue plan of care.  OT Short Term Goals Week 3:  OT Short Term Goal 1 (Week 3): Pt will complete functional shower transfer with Max A OT Short Term Goal 1 - Progress (Week 3): Met OT Short Term Goal 2 (Week 3): Pt will complete lower body dressing with Mod A and no rest breaks OT Short Term Goal 2 - Progress (Week 3): Met OT Short Term Goal 3 (Week 3): Pt will engage in 5 minutes of therapeutic activity without rest to increase activity tolerance for self-care tasks OT Short Term Goal 3 - Progress (Week 3): Met OT Short Term Goal 4 (Week 3): Pt will tolerate standing at the sink for grooming task for 2 munites with moderate assistance for standing balance OT Short Term Goal 4 - Progress (Week 3): Met OT Short Term Goal 5 (Week 3): Pt will be given HEP OT Short Term Goal 5 - Progress (Week 3): Met Week 4:  OT Short Term Goal 1  (Week 4): Pt will tolerate standing at the sink for grooming task for 8 mins without rest break  OT Short Term Goal 2 (Week 4): Pt will ambulate into the bathroom and transfer on/off toilet with Mod A OT Short Term Goal 3 (Week 4): Pt will don/doff TED hose with Min A OT Short Term Goal 4 (Week 4): Pt will maintain dynamic standing balance while reaching outside base of support with Min A   Skilled Therapeutic Interventions/Progress Updates:   Pt seen for skilled OT treatment today addressing ADL/self-care, sit<>stands, activity tolerance, general strengthening, and standing balance. Pt greeted sitting in recliner upon OT arrive and eager to participate in OT . Pt required Max A to stand from low recliner, progressing to Mod A to stand from w/c throughout session. Pt ambulated to the sink with RW and Mod A. She completed grooming tasks in standing for 5 minutes with 1 seated rest break. She required Min A to maintain standing balance while reaching within base of support. Pt then performed light sink bath, requiring Mod A to maintain standing balance while pt washed peri-area and buttocks. Pt then completed UB dressing with set-up/supervision, and Min A and 2 rest breaks. Pt propelled w/c short distance in the hallway with cues for technique. Pt then requested to go to vending machine and was able to reach outside base of support sitting in w/c to get snack. Pt returned to room at end of session and was left seated in recliner with safety  belt in place and needs met.   Therapy Documentation Precautions:  Precautions Precautions: Fall Restrictions Weight Bearing Restrictions: No General:   Vital Signs: Therapy Vitals Temp: 98.4 F (36.9 C) Temp Source: Oral Pulse Rate: (!) 135 (in standing after activity- 91 sitting w/ rest) Resp: 18 BP: 108/62 Patient Position (if appropriate): Sitting Oxygen Therapy SpO2: 100 % O2 Device: Not Delivered Pain: Pain Assessment Pain Assessment: No/denies  pain ADL: ADL Grooming: Setup, Contact guard Where Assessed-Grooming: Sitting at sink, Wheelchair Upper Body Bathing: Moderate assistance Where Assessed-Upper Body Bathing: Sitting at sink Lower Body Bathing: Maximal assistance Where Assessed-Lower Body Bathing: Standing at sink Upper Body Dressing: Maximal assistance Where Assessed-Upper Body Dressing: Edge of bed Lower Body Dressing: Maximal assistance Where Assessed-Lower Body Dressing: Wheelchair Toileting: Dependent Where Assessed-Toileting: Bedside Commode Toilet Transfer: Moderate assistance Toilet Transfer Method: Stand pivot Toilet Transfer Equipment: Engineer, technical sales Transfer: Not assessed  See Function Navigator for Current Functional Status.   Therapy/Group: Individual Therapy  Samantha Kline 12/14/2015, 4:16 PM

## 2015-12-15 ENCOUNTER — Inpatient Hospital Stay (HOSPITAL_COMMUNITY): Payer: Managed Care, Other (non HMO) | Admitting: Physical Therapy

## 2015-12-15 ENCOUNTER — Inpatient Hospital Stay (HOSPITAL_COMMUNITY): Payer: Managed Care, Other (non HMO) | Admitting: Speech Pathology

## 2015-12-15 ENCOUNTER — Inpatient Hospital Stay (HOSPITAL_COMMUNITY): Payer: Managed Care, Other (non HMO) | Admitting: Occupational Therapy

## 2015-12-15 DIAGNOSIS — D702 Other drug-induced agranulocytosis: Secondary | ICD-10-CM

## 2015-12-15 LAB — CBC WITH DIFFERENTIAL/PLATELET
Basophils Absolute: 0.1 10*3/uL (ref 0.0–0.1)
Basophils Relative: 2 %
Eosinophils Absolute: 0.1 10*3/uL (ref 0.0–0.7)
Eosinophils Relative: 1 %
HCT: 29.3 % — ABNORMAL LOW (ref 36.0–46.0)
Hemoglobin: 9.2 g/dL — ABNORMAL LOW (ref 12.0–15.0)
Lymphocytes Relative: 40 %
Lymphs Abs: 1.6 10*3/uL (ref 0.7–4.0)
MCH: 31.2 pg (ref 26.0–34.0)
MCHC: 31.4 g/dL (ref 30.0–36.0)
MCV: 99.3 fL (ref 78.0–100.0)
Monocytes Absolute: 0.6 10*3/uL (ref 0.1–1.0)
Monocytes Relative: 15 %
Neutro Abs: 1.7 10*3/uL (ref 1.7–7.7)
Neutrophils Relative %: 42 %
Platelets: 318 10*3/uL (ref 150–400)
RBC: 2.95 MIL/uL — ABNORMAL LOW (ref 3.87–5.11)
RDW: 17.8 % — ABNORMAL HIGH (ref 11.5–15.5)
WBC: 4.1 10*3/uL (ref 4.0–10.5)

## 2015-12-15 LAB — COMPREHENSIVE METABOLIC PANEL
ALT: 25 U/L (ref 14–54)
AST: 37 U/L (ref 15–41)
Albumin: 1.7 g/dL — ABNORMAL LOW (ref 3.5–5.0)
Alkaline Phosphatase: 89 U/L (ref 38–126)
Anion gap: 5 (ref 5–15)
BUN: 10 mg/dL (ref 6–20)
CO2: 23 mmol/L (ref 22–32)
Calcium: 8.2 mg/dL — ABNORMAL LOW (ref 8.9–10.3)
Chloride: 104 mmol/L (ref 101–111)
Creatinine, Ser: 0.77 mg/dL (ref 0.44–1.00)
GFR calc Af Amer: >60 mL/min (ref >60)
GFR calc non Af Amer: >60 mL/min (ref >60)
Glucose, Bld: 82 mg/dL (ref 65–99)
Potassium: 3.9 mmol/L (ref 3.5–5.1)
Sodium: 132 mmol/L — ABNORMAL LOW (ref 135–145)
Total Bilirubin: 0.4 mg/dL (ref 0.3–1.2)
Total Protein: 6.6 g/dL (ref 6.5–8.1)

## 2015-12-15 MED ORDER — FLUCONAZOLE 100 MG PO TABS
100.0000 mg | ORAL_TABLET | ORAL | Status: DC
  Administered 2015-12-15 – 2015-12-22 (×2): 100 mg via ORAL
  Filled 2015-12-15 (×3): qty 1

## 2015-12-15 NOTE — Progress Notes (Signed)
Chaplain went by the patient's room to follow up with the completion of the Advance Directive. The assigned Nurse reported that the patient was having Speech Therapy session, and following that will be Physical Therapy. Chaplain will continue to follow up to complete Advance Directive. Chaplain Janell QuietAudrey Lamija Besse [phone]

## 2015-12-15 NOTE — Progress Notes (Signed)
Speech Language Pathology Daily Session Note  Patient Details  Name: Samantha Kline MRN: [id number] Date of Birth: 04-27-1960  Today's Date: 12/15/2015 SLP Individual Time: 1003-1100 SLP Individual Time Calculation (min): 57 min   Short Term Goals: Week 3: SLP Short Term Goal 1 (Week 3): Pt will utilize increased vocal intensity to achieve intelligibility at the sentence level with min assist verbal cues.  SLP Short Term Goal 2 (Week 3): Patient will problem solve use of call bell with supervision verbal cues  SLP Short Term Goal 3 (Week 3): Patient will utilize external aids to recall daily information with supervision cues.  SLP Short Term Goal 4 (Week 3): Patient will demonstrate sustained attention to familiar tasks for 5 minutes with supervision verbal cues for redirection  SLP Short Term Goal 5 (Week 3): Pt will consume soft diet with mod I use of standard swallowing precautions over three targeted sessions and with  minimal overt s/s of aspiration.    Skilled Therapeutic Interventions: Pt was seen for skilled ST targeting cognitive goals.  Pt in bed upon arrival but agreeable to getting up and participating in therapies.  Pt completed oral care with mod I for sequencing and initiation which is a huge improvement from when she initially arrived on rehab (at times needed up to max assist for initiation).  Therapist facilitated the session with practice using external memory aids to facilitate recall of information needed for discharge home.  Pt recorded 2.5 pages of things that she wants to remember while preparing for discharge home (time of family education, prescriptions, necessary equipment, etc.) Pt was able to anticipate activities for which she will need assistance at home and generate appropriate solutions with subtle supervision question cues.  Pt was encouraged to keep her memory notebook close to her at all times and record ideas as they come to her.  Pt very receptive to using daily  memory notebook.  Pt was returned to room and left in wheelchair with call bell within reach.  Continue per current plan of care.    Function:  Eating Eating                 Cognition Comprehension Comprehension assist level: Follows complex conversation/direction with extra time/assistive device  Expression   Expression assist level: Expresses complex 90% of the time/cues < 10% of the time  Social Interaction Social Interaction assist level: Interacts appropriately 90% of the time - Needs monitoring or encouragement for participation or interaction.  Problem Solving Problem solving assist level: Solves basic 90% of the time/requires cueing < 10% of the time  Memory Memory assist level: Recognizes or recalls 75 - 89% of the time/requires cueing 10 - 24% of the time    Pain Pain Assessment Pain Assessment: No/denies pain  Therapy/Group: Individual Therapy  Wilkins Elpers, [name] 12/15/2015, 11:01 AM

## 2015-12-15 NOTE — Progress Notes (Signed)
Social Work Patient ID: Samantha Kline, female   DOB: August 03, 1960, 55 y.o.   MRN: [id number]  Met with pt and left message for husband to discuss team conference and her upgraded goals to min assist level and new discharge date 9/2. She is pleased with her progress and feels she has turned a corner. Discussed family education next week with whoever will be there with her at home. Will work on equipment and follow up needs. Pt is agreeable to the Plans. Will await husband return call regarding questions.

## 2015-12-15 NOTE — Progress Notes (Signed)
[facility] PHYSICAL MEDICINE & REHABILITATION     PROGRESS NOTE  Subjective/Complaints:  Pt laying in bed this AM.  She states she is doing better.  She no longer has BP issues.   ROS: Denies CP, SOB, N/V/D.  Objective: Vital Signs: Blood pressure 120/86, pulse 78, temperature 98.1 F (36.7 C), temperature source Oral, resp. rate 18, height 5\' 4"  (1.626 m), weight 46.3 kg (102 lb), SpO2 100 %. No results found.  Recent Labs  12/15/15 0533  WBC 4.1  HGB 9.2*  HCT 29.3*  PLT 318    Recent Labs  12/15/15 0533  NA 132*  K 3.9  CL 104  GLUCOSE 82  BUN 10  CREATININE 0.77  CALCIUM 8.2*   CBG (last 3)  No results for input(s): GLUCAP in the last 72 hours.  Wt Readings from Last 3 Encounters:  12/11/15 46.3 kg (102 lb)  11/23/15 60.6 kg (133 lb 11.2 oz)  10/31/15 58.6 kg (129 lb 3 oz)    Physical Exam:  BP 120/86 (BP Location: Left Arm)   Pulse 78   Temp 98.1 F (36.7 C) (Oral)   Resp 18   Ht 5\' 4"  (1.626 m)   Wt 46.3 kg (102 lb)   SpO2 100%   BMI 17.51 kg/m  Constitutional: Vital signs are normal. NAD. She appears cachectic. She has a sickly appearance.  HENT: Normocephalic and atraumatic.  Eyes: Conjunctivae and EOM are normal.  Cardiovascular: Regular rate and rhythm. No murmur heard. Respiratory: Effort normal and breath sounds normal. No stridor. No respiratory distress. She has no wheezes.  GI: Soft. Bowel sounds are normal. She exhibits no distension. There is no tenderness.  Musculoskeletal: She exhibits no edema or tenderness.  Muscle wasting all limbs.  Neurological:  A&O X3. Motor:  B/l UE: 4/5 deltoid, biceps, triceps, 4/5 wrist and HI (improving)  B/l LE: 4-/5 HF, 4+/5 KE/KF and 4+/5 ADF/PF (improving)  Skin: Skin is warm and dry. No rash noted. No erythema.  2 sacral decubitus ulcers with moisture associated ulcers healing.  Psychiatric: Her affect is blunt. She is slowed. Cognition and memory are impaired (improving).   Assessment/Plan: 1.  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 Physiatrist is providing close team supervision and 24 hour management of active medical problems listed below. Physiatrist and rehab team continue to assess barriers to discharge/monitor patient progress toward functional and medical goals.  Function:  Bathing Bathing position   Position: Standing at sink  Bathing parts Body parts bathed by patient: Front perineal area, Buttocks Body parts bathed by helper: Front perineal area, Buttocks  Bathing assist Assist Level: Touching or steadying assistance(Pt > 75%), Set up   Set up : To adjust water temperature, To open containers  Upper Body Dressing/Undressing Upper body dressing   What is the patient wearing?: Pull over shirt/dress     Pull over shirt/dress - Perfomed by patient: Thread/unthread right sleeve, Thread/unthread left sleeve, Put head through opening, Pull shirt over trunk Pull over shirt/dress - Perfomed by helper: Put head through opening Button up shirt - Perfomed by patient: Thread/unthread left sleeve Button up shirt - Perfomed by helper: Thread/unthread right sleeve, Pull shirt around back, Button/unbutton shirt    Upper body assist Assist Level: More than reasonable time, Set up   Set up : To obtain clothing/put away  Lower Body Dressing/Undressing Lower body dressing   What is the patient wearing?:  Pants, Non-skid slipper socks, Ted Hose   Underwear - Performed by helper: Thread/unthread right underwear leg, Thread/unthread left underwear leg, Pull underwear up/down Pants- Performed by patient: Thread/unthread right pants leg, Thread/unthread left pants leg, Pull pants up/down, Fasten/unfasten pants Pants- Performed by helper: Thread/unthread left pants leg Non-skid slipper socks- Performed by patient: Don/doff left sock, Don/doff right sock Non-skid slipper  socks- Performed by helper: Don/doff right sock, Don/doff left sock               TED Hose - Performed by helper: Don/doff left TED hose, Don/doff right TED hose  Lower body assist Assist for lower body dressing: Touching or steadying assistance (Pt > 75%)      Toileting Toileting Toileting activity did not occur: No continent bowel/bladder event Toileting steps completed by patient: Adjust clothing prior to toileting, Performs perineal hygiene, Adjust clothing after toileting Toileting steps completed by helper: Adjust clothing after toileting, Adjust clothing prior to toileting Toileting Assistive Devices: Other (comment) (elevated seat & rails, RW)  Toileting assist Assist level: Touching or steadying assistance (Pt.75%)   Transfers Chair/bed transfer Chair/bed transfer activity did not occur: Safety/medical concerns Chair/bed transfer method: Stand pivot Chair/bed transfer assist level: Supervision or verbal cues Chair/bed transfer assistive device: Armrests, Walker Mechanical lift: Landscape architect Ambulation activity did not occur: Safety/medical concerns   Max distance: 57ft Assist level: Touching or steadying assistance (Pt > 75%)   Wheelchair   Type: Manual Max wheelchair distance: 174ft Assist Level: Supervision or verbal cues  Cognition Comprehension Comprehension assist level: Follows basic conversation/direction with no assist  Expression Expression assist level: Expresses basic needs/ideas: With extra time/assistive device  Social Interaction Social Interaction assist level: Interacts appropriately 90% of the time - Needs monitoring or encouragement for participation or interaction.  Problem Solving Problem solving assist level: Solves basic 75 - 89% of the time/requires cueing 10 - 24% of the time  Memory Memory assist level: Recognizes or recalls 50 - 74% of the time/requires cueing 25 - 49% of the time    Medical Problem List and Plan: 1.   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  DVT Prophylaxis/Anticoagulation: Mechanical: Sequential compression devices, below knee Bilateral lower extremities 3. Pain Management: Tylenol prn 4. Mood: LCSW to follow for evaluation and support.  5. Neuropsych: This patient is not fully capable of making decisions on her own behalf. 6. Skin/Wound Care: Ordered air mattress overlay due to malnutrition, poor intake   2 sacral decubiti, pressure relief, keep dry, protein supplementation, foam dressing 7. Fluids/Electrolytes/Nutrition:  Continue dextrose for now.   Liquid diet advanced to soft diet at present, will cont to advance as tolerated 8. HIV/AIDS with MAC?:  On descovy, ticovey  Biaxin and ethambutol d/ced per ID on 8/9.   Follow up per ID. Appreciate recs 9. Candida/CMV esophagitis: Diflucan and on Ganciclovir since 08/03 for 21 day therapy/while inpatient be changed to PO 900 mg of valcyte BID at discharge per ID.  Appreciate recs.  Trial off fluconazole after 14 days per ID (last day 8/21) 10. FTT: Continue IVF for now.   Appetite improving  11. Potential for pancytopenia/fluctuation:  Check labs periodically given medications  GCSF to support marrow at least twice a week per ID 12. Hyponatremia/Hypokalemia: Monitor lytes.   Na+132 on 8/24: Will cont to monitor, treat if necessary  K+ 3.9 on 8/24.   Cont to monitor 13. PCP prophylaxis: Bactrim discontinued due to hyponatremia.  Has been treated for PCP infection and dapsone resumed 8/4  for prophylaxis.  14. Hypoalbuminemia  Supplement started 8/6 15. Leukocytosis: Resolved, afebrile- will contact ID if pt becomes febrile 16. Anemia of chronic disease  Hb 9.2 on 8/24  Cont to monitor 17. Orthostasis: Improving  TEDS, abdominal binder  IVF qHS completed x3 days  Encouraged more fluids 18. Neutropenia  Will speak to ID regarding medications  LOS (Days) 20 A FACE TO FACE  EVALUATION WAS PERFORMED  Maleka Contino Karis Juba 12/15/2015 10:26 AM

## 2015-12-15 NOTE — Progress Notes (Signed)
Occupational Therapy Session Note  Patient Details  Name: Samantha Kline MRN: [id number] Date of Birth: 24-Jan-1961  Today's Date: 12/15/2015 OT Individual Time: 0800-0900 OT Individual Time Calculation (min): 60 min     Short Term Goals: Week 2:  OT Short Term Goal 1 (Week 2): Pt will complete functional toilet transfer with Max A OT Short Term Goal 1 - Progress (Week 2): Met OT Short Term Goal 2 (Week 2): Pt will complete UB bathing with Min A OT Short Term Goal 2 - Progress (Week 2): Met OT Short Term Goal 3 (Week 2): Pt will complete sit to stand for LB bathing with Max A OT Short Term Goal 3 - Progress (Week 2): Met OT Short Term Goal 4 (Week 2): Pt will engage in 5 minutes of therapeutic activity without rest to increase activity tolerance for self-care tasks OT Short Term Goal 4 - Progress (Week 2): Progressing toward goal Week 3:  OT Short Term Goal 1 (Week 3): Pt will complete functional shower transfer with Max A OT Short Term Goal 1 - Progress (Week 3): Met OT Short Term Goal 2 (Week 3): Pt will complete lower body dressing with Mod A and no rest breaks OT Short Term Goal 2 - Progress (Week 3): Met OT Short Term Goal 3 (Week 3): Pt will engage in 5 minutes of therapeutic activity without rest to increase activity tolerance for self-care tasks OT Short Term Goal 3 - Progress (Week 3): Met OT Short Term Goal 4 (Week 3): Pt will tolerate standing at the sink for grooming task for 2 munites with moderate assistance for standing balance OT Short Term Goal 4 - Progress (Week 3): Met OT Short Term Goal 5 (Week 3): Pt will be given HEP OT Short Term Goal 5 - Progress (Week 3): Met  Skilled Therapeutic Interventions/Progress Updates:   1:1 Self care retraining at shower level with focus on functional ambulation around room with RW with steadying A, activity tolerance, standing balance and sequencing and task organization. Pt able to perform bed mobility with supervision. Pt able to come  into standing at EOB with supervision but required mod  A to stand from low toilet. Showered sitting on BSC today and able to perform sit to stand with grab bar with supervision. Pt with wide BOS when standing. Pt able to dress sit to stand from w/c with close supervision and extra time. Pt did require mod questioning cuing in the shower for sequencing to bathe thoroughly.   Therapy Documentation Precautions:  Precautions Precautions: Fall Restrictions Weight Bearing Restrictions: No General:   Vital Signs: Therapy Vitals Temp: 97.7 F (36.5 C) Temp Source: Oral Pulse Rate: 99 Resp: 18 BP: 104/70 Patient Position (if appropriate): Sitting Oxygen Therapy SpO2: 100 % O2 Device: Not Delivered Pain:  no c/o pain  ADL: ADL Grooming: Setup, Contact guard Where Assessed-Grooming: Sitting at sink, Wheelchair Upper Body Bathing: Moderate assistance Where Assessed-Upper Body Bathing: Sitting at sink Lower Body Bathing: Maximal assistance Where Assessed-Lower Body Bathing: Standing at sink Upper Body Dressing: Maximal assistance Where Assessed-Upper Body Dressing: Edge of bed Lower Body Dressing: Maximal assistance Where Assessed-Lower Body Dressing: Wheelchair Toileting: Dependent Where Assessed-Toileting: Bedside Commode Toilet Transfer: Moderate assistance Toilet Transfer Method: Stand pivot Toilet Transfer Equipment: Engineer, technical sales Transfer: Not assessed  See Function Navigator for Current Functional Status.   Therapy/Group: Individual Therapy  Willeen Cass [facility] 12/15/2015, 3:43 PM

## 2015-12-15 NOTE — Progress Notes (Signed)
Physical Therapy Session Note  Patient Details  Name: Auda Finfrock MRN: [id number] Date of Birth: Jan 21, 1961  Today's Date: 12/15/2015 PT Individual Time: 1116-1202 PT Individual Time Calculation (min): 46 min    Short Term Goals: Week 2:  PT Short Term Goal 1 (Week 2): Pt will maintain static & dynamic sitting without back support consistently with  Mod A. PT Short Term Goal 1 - Progress (Week 2): Met PT Short Term Goal 2 (Week 2): Pt will transfer supine<>sitting with use of hospital bed features & mod A. PT Short Term Goal 2 - Progress (Week 2): Met PT Short Term Goal 3 (Week 2): Pt will perform bed<>chair transfers with consistent Max A +1.  PT Short Term Goal 3 - Progress (Week 2): Met PT Short Term Goal 4 (Week 2): Pt will ambulate 10 ft in parallel bars with Max A. PT Short Term Goal 4 - Progress (Week 2): Met Week 3:  PT Short Term Goal 1 (Week 3): Pt will maintain standing balance with Mod A. PT Short Term Goal 2 (Week 3): Pt will demonstrate bed mobility (supine<>sit, rolling L<>R) consistently with supervision and hospital bed features.  PT Short Term Goal 3 (Week 3): Pt will ambulate 35 ft with LRAD & min A.  PT Short Term Goal 4 (Week 3): Pt will negotiate 4 steps (6") with B rails & min A for strengthening.  Skilled Therapeutic Interventions/Progress Updates:     Patient received sitting in tilt in space WC and agreeable to PT, but reports incontinent bladder episode. PT assisted patient in sit<>stand with min A, and standing balance to doff and don new brief as well as perform perineal hygiene with supervision A.   WC tmobility for 189f wih supervision A from PT. Min cues for improved use of RUE to prevent veering toward R side.   PT instructed patient in stair negotiation training with min Assist on 8 steps (3") and BUE support. PT provided min-mod cues for proper step-to gait pattern. Patient demonstrated self selected step-over-step ascent, and step-to gait following  cues with descent. HR assessed following steps at 125bpm, decreased to 113 after 2 minute rest break.   Gait training x 1834fwith RW and min A from PT as well as mod cues for increased step width, improved posture, And to maintain RW closer to BOS. Following gait training patient picked tissue off floor with min A from PT. No light-headedness or dizziness associated with change in head position following picking item up from floor.  PT Assessed vitals following extended gait training: BP:138/79, HR 135bpm. After 3 minute rest break; BP124/76, HR 114bpm  Patient left sitting in TIS WC with call bell in reach all needs met.     Therapy Documentation Precautions:  Precautions Precautions: Fall Restrictions Weight Bearing Restrictions: No Vital Signs:   see above Pain: Pain Assessment Pain Assessment: No/denies pain   See Function Navigator for Current Functional Status.   Therapy/Group: Individual Therapy  AuLorie Phenix/24/2017, 2:06 PM

## 2015-12-15 NOTE — Progress Notes (Signed)
Patient ID: Samantha Kline, female   DOB: 11-May-1960, 55 y.o.   MRN: [id number]          Samantha Kline    Date of Admission:  11/25/2015            Day 21 ganciclovir  Active Problems:   Human immunodeficiency virus (HIV) Kline (HCC)   HIV encephalopathy (HCC)   Benign essential HTN   Stage 2 skin ulcer of sacral region   Mycobacterium avium complex (HCC)   Thrush   Palliative care encounter   Goals of care, counseling/discussion   Drug-induced neutropenia (HCC)   . antiseptic oral rinse  7 mL Mouth Rinse BID  . dapsone  100 mg Oral Daily  . dimethicone   Topical TID PC  . dolutegravir  50 mg Oral Q1200  . dronabinol  5 mg Oral BID AC  . emtricitabine-tenofovir AF  1 tablet Oral Q1200  . feeding supplement (ENSURE ENLIVE)  237 mL Oral BID BM  . feeding supplement (PRO-STAT SUGAR FREE 64)  30 mL Oral BID  . ferrous sulfate  325 mg Oral Q breakfast  . ganciclovir (CYTOVENE) IV  2.5 mg/kg Intravenous Q12H  . Gerhardt's butt cream   Topical BID  . multivitamin with minerals  1 tablet Oral Daily  . MUSCLE RUB   Topical BID  . potassium chloride  20 mEq Oral BID  . sodium chloride 0.9 % 1,000 mL infusion   Intravenous Daily  . vitamin B-12  100 mcg Oral Daily  . Vitamin D (Ergocalciferol)  50,000 Units Oral Q Fri    SUBJECTIVE: She states that she is feeling "great". She is not having any more difficulty swallowing. She is not short of breath. Her appetite has improved and she is regaining some of the weight she has lost.  Review of Systems: Review of Systems  Constitutional: Negative for chills, diaphoresis, fever, malaise/fatigue and weight loss.  HENT: Negative for sore throat.   Respiratory: Negative for cough, sputum production and shortness of breath.   Cardiovascular: Negative for chest pain.  Gastrointestinal: Negative for abdominal pain, diarrhea, nausea and vomiting.  Genitourinary: Negative for dysuria.  Musculoskeletal: Negative for  joint pain and myalgias.  Skin: Negative for rash.  Neurological: Negative for dizziness and headaches.  Psychiatric/Behavioral: Negative for depression.    Past Medical History:  Diagnosis Date  . Anemia   . HIV (human immunodeficiency virus infection) (HCC)    11/06/15 Family states it was just diagnosed this week  . Pneumonia 10/24/2015  . Vitamin D deficiency     Social History  Substance Use Topics  . Smoking status: Never Smoker  . Smokeless tobacco: Never Used  . Alcohol use Yes     Comment: 10/24/2015 "nothing since the 1980s"    Family History  Problem Relation Age of Onset  . Diabetes Father   . Heart attack Brother   . Hypertension Sister    Allergies  Allergen Reactions  . Septra [Sulfamethoxazole-Trimethoprim]     Possible cause of hyponatremia    OBJECTIVE: Vitals:   12/14/15 1400 12/14/15 1614 12/15/15 0418 12/15/15 0846  BP: 108/62  103/64 120/86  Pulse: (!) 105 (!) 135 78   Resp: 18  18   Temp: 98.4 F (36.9 C)  98.1 F (36.7 C)   TempSrc: Oral  Oral   SpO2: 100%  100%   Weight:      Height:       Body mass index is 17.51  kg/m.  Physical Exam  Constitutional: She is oriented to person, place, and time.  She is sitting up in her wheelchair. She is in good spirits. She has gained 8 pounds in the past week.  HENT:  Mouth/Throat: No oropharyngeal exudate.  Eyes: Conjunctivae are normal.  Cardiovascular: Normal rate and regular rhythm.   No murmur heard. Pulmonary/Chest: Effort normal and breath sounds normal.  Abdominal: Soft. There is no tenderness.  Musculoskeletal: Normal range of motion. She exhibits no edema or tenderness.  Neurological: She is alert and oriented to person, place, and time.  Skin: No rash noted.  Psychiatric: Mood and affect normal.    Lab Results Lab Results  Component Value Date   WBC 4.1 12/15/2015   HGB 9.2 (L) 12/15/2015   HCT 29.3 (L) 12/15/2015   MCV 99.3 12/15/2015   PLT 318 12/15/2015    Lab Results    Component Value Date   CREATININE 0.77 12/15/2015   BUN 10 12/15/2015   NA 132 (L) 12/15/2015   K 3.9 12/15/2015   CL 104 12/15/2015   CO2 23 12/15/2015    Lab Results  Component Value Date   ALT 25 12/15/2015   AST 37 12/15/2015   ALKPHOS 89 12/15/2015   BILITOT 0.4 12/15/2015    HIV 1 RNA Quant (copies/mL)  Date Value  11/30/2015 40  11/16/2015 200   CD4 T Cell Abs (/uL)  Date Value  11/16/2015 20 (L)    Microbiology: No results found for this or any previous visit (from the past 240 hour(s)).   ASSESSMENT: Her HIV infection has come under much better control since starting antiretroviral therapy last month. Her viral load is down from 220,000 to 40. She is starting to have some slow CD4 reconstitution from 20 to 40. She has recovered from her pneumocystis pneumonia and CMV esophagitis. AFB blood cultures remain negative.  PLAN: 1. Continue Descovy and T[place] 2. Continue dapsone 3. Start fluconazole 100 mg weekly 4. Discontinue ganciclovir. I will not start oral valganciclovir as chronic maintenance therapy is not needed for CMV esophagitis 5. She already has an appointment scheduled to be seen by Dr. Daiva Eves in our clinic on 01/03/2016 6. Please call if we can be of further assistance while she is here  Cliffton Asters, MD [facility] [phone] pager   [phone] cell 12/15/2015, 2:00 PM

## 2015-12-16 ENCOUNTER — Inpatient Hospital Stay (HOSPITAL_COMMUNITY): Payer: Managed Care, Other (non HMO) | Admitting: Occupational Therapy

## 2015-12-16 ENCOUNTER — Inpatient Hospital Stay (HOSPITAL_COMMUNITY): Payer: Managed Care, Other (non HMO) | Admitting: Speech Pathology

## 2015-12-16 ENCOUNTER — Inpatient Hospital Stay (HOSPITAL_COMMUNITY): Payer: Managed Care, Other (non HMO) | Admitting: Physical Therapy

## 2015-12-16 MED ORDER — ORAL CARE MOUTH RINSE
15.0000 mL | Freq: Two times a day (BID) | OROMUCOSAL | Status: DC
  Administered 2015-12-17 – 2015-12-22 (×8): 15 mL via OROMUCOSAL

## 2015-12-16 NOTE — Progress Notes (Signed)
Occupational Therapy Session Note  Patient Details  Name: Ilo Beamon MRN: [id number] Date of Birth: 02/07/1961  Today's Date: 12/16/2015 OT Individual Time: 0867-6195 OT Individual Time Calculation (min): 70 min     Short Term Goals:Week 1:  OT Short Term Goal 1 (Week 1): Pt will complete functional toilet transfer with Max A OT Short Term Goal 1 - Progress (Week 1): Met OT Short Term Goal 2 (Week 1): Pt will complete UB bathing with Min A OT Short Term Goal 2 - Progress (Week 1): Met OT Short Term Goal 3 (Week 1): Pt will complete sit to stand for LB bathing with Max A OT Short Term Goal 3 - Progress (Week 1): Met OT Short Term Goal 4 (Week 1): Pt will engage in 5 minutes of therapeutic activity without rest to increase activity tolerance for self care completion  OT Short Term Goal 4 - Progress (Week 1): Progressing toward goal Week 2:  OT Short Term Goal 1 (Week 2): Pt will complete functional toilet transfer with Max A OT Short Term Goal 1 - Progress (Week 2): Met OT Short Term Goal 2 (Week 2): Pt will complete UB bathing with Min A OT Short Term Goal 2 - Progress (Week 2): Met OT Short Term Goal 3 (Week 2): Pt will complete sit to stand for LB bathing with Max A OT Short Term Goal 3 - Progress (Week 2): Met OT Short Term Goal 4 (Week 2): Pt will engage in 5 minutes of therapeutic activity without rest to increase activity tolerance for self-care tasks OT Short Term Goal 4 - Progress (Week 2): Progressing toward goal  Skilled Therapeutic Interventions/Progress Updates:    Pt seen for skilled OT to facilitate activity tolerance, functional mobility, balance with an emphasis on energy conservation with ADL retraining. Pt ambulated with RW with S to toilet and then to shower.  Pt cued to pace herself and move slowly to prevent HR from increasing.  Pt was able to bathe and dress with S, except for A with TED hose. HR 116 and then after 5 min of rest it was still elevated at 106.   Discussed energy conservation techniques at home and provided pt with handouts for [facility] with all ADLs.  Instructed pt to work on light AROM arm exercises later in the day when her body is rested. Demonstrated exercises for pt to perform. Pt opted to stay sitting up in wc with all needs met.   Therapy Documentation Precautions:  Precautions Precautions: Fall Restrictions Weight Bearing Restrictions: No Therapy Vitals Pulse Rate: (!) 116 Pain: Pain Assessment Pain Assessment: No/denies pain   ADL: See Function Navigator for Current Functional Status.   Therapy/Group: Individual Therapy  [place] 12/16/2015, 12:22 PM

## 2015-12-16 NOTE — Plan of Care (Signed)
Problem: RH Balance Goal: LTG Patient will maintain dynamic standing balance (PT) LTG:  Patient will maintain dynamic standing balance with assistance during mobility activities (PT)  Upgraded due to progress.   Problem: RH Bed Mobility Goal: LTG Patient will perform bed mobility with assist (PT) LTG: Patient will perform bed mobility with assistance, with/without cues (PT).  Upgraded d/t progress.   Problem: RH Bed to Chair Transfers Goal: LTG Patient will perform bed/chair transfers w/assist (PT) LTG: Patient will perform bed/chair transfers with assistance, with/without cues (PT).  Upgraded due to progress  Problem: RH Car Transfers Goal: LTG Patient will perform car transfers with assist (PT) LTG: Patient will perform car transfers with assistance (PT).  Upgraded d/t progress  Problem: RH Ambulation Goal: LTG Patient will ambulate in controlled environment (PT) LTG: Patient will ambulate in a controlled environment, # of feet with assistance (PT).  Upgraded d/t progress Goal: LTG Patient will ambulate in home environment (PT) LTG: Patient will ambulate in home environment, # of feet with assistance (PT).  Upgraded due to progress  Problem: RH Stairs Goal: LTG Patient will ambulate up and down stairs w/assist (PT) LTG: Patient will ambulate up and down # of stairs with assistance (PT)  Upgraded due to progress

## 2015-12-16 NOTE — Progress Notes (Addendum)
[place] PHYSICAL MEDICINE & REHABILITATION     PROGRESS NOTE  Subjective/Complaints:  Pt laying in bed today.  She states she slept "great" and feels "great".  She has questions about her recent labs.   ROS: Denies CP, SOB, N/V/D.  Objective: Vital Signs: Blood pressure 104/66, pulse 85, temperature 97.8 F (36.6 C), temperature source Oral, resp. rate 17, height 5\' 4"  (1.626 m), weight 46.3 kg (102 lb), SpO2 97 %. No results found.  Recent Labs  12/15/15 0533  WBC 4.1  HGB 9.2*  HCT 29.3*  PLT 318    Recent Labs  12/15/15 0533  NA 132*  K 3.9  CL 104  GLUCOSE 82  BUN 10  CREATININE 0.77  CALCIUM 8.2*   CBG (last 3)  No results for input(s): GLUCAP in the last 72 hours.  Wt Readings from Last 3 Encounters:  12/11/15 46.3 kg (102 lb)  11/23/15 60.6 kg (133 lb 11.2 oz)  10/31/15 58.6 kg (129 lb 3 oz)    Physical Exam:  BP 104/66 (BP Location: Left Arm)   Pulse 85   Temp 97.8 F (36.6 C) (Oral)   Resp 17   Ht 5\' 4"  (1.626 m)   Wt 46.3 kg (102 lb)   SpO2 97%   BMI 17.51 kg/m  Constitutional: Vital signs are normal. NAD. She appears cachectic. She has a sickly appearance.  HENT: Normocephalic and atraumatic.  Eyes: Conjunctivae and EOM are normal.  Cardiovascular: Regular rate and rhythm. No murmur heard. Respiratory: Effort normal and breath sounds normal. No stridor. No respiratory distress. She has no wheezes.  GI: Soft. Bowel sounds are normal. She exhibits no distension. There is no tenderness.  Musculoskeletal: She exhibits no edema or tenderness.  Muscle wasting all limbs.  Neurological:  A&O X3. Motor:  B/l UE: 4+/5 deltoid, biceps, triceps, wrist and HI (improving)  B/l LE: 4/5 HF, 4+/5 KE/KF and 5/5 ADF/PF (improving, Right stronger than left)  Skin: Skin is warm and dry. No rash noted. No erythema.  2 sacral decubitus ulcers with moisture associated ulcers healing (not examined today).  Psychiatric: Her affect is blunt. She is slowed.  Cognition and memory are impaired (improving).   Assessment/Plan: 1. 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 Physiatrist is providing close team supervision and 24 hour management of active medical problems listed below. Physiatrist and rehab team continue to assess barriers to discharge/monitor patient progress toward functional and medical goals.  Function:  Bathing Bathing position   Position: Standing at sink  Bathing parts Body parts bathed by patient: Front perineal area, Buttocks Body parts bathed by helper: Front perineal area, Buttocks  Bathing assist Assist Level: Touching or steadying assistance(Pt > 75%), Set up   Set up : To adjust water temperature, To open containers  Upper Body Dressing/Undressing Upper body dressing   What is the patient wearing?: Pull over shirt/dress     Pull over shirt/dress - Perfomed by patient: Thread/unthread right sleeve, Thread/unthread left sleeve, Put head through opening, Pull shirt over trunk Pull over shirt/dress - Perfomed by helper: Put head through opening Button up shirt - Perfomed by patient: Thread/unthread left sleeve Button up shirt - Perfomed by helper: Thread/unthread right sleeve, Pull shirt around back, Button/unbutton shirt    Upper body assist Assist Level: More than reasonable time, Set up   Set up : To obtain clothing/put away  Lower Body Dressing/Undressing Lower body  dressing   What is the patient wearing?: Pants, Non-skid slipper socks, Ted Hose   Underwear - Performed by helper: Thread/unthread right underwear leg, Thread/unthread left underwear leg, Pull underwear up/down Pants- Performed by patient: Thread/unthread right pants leg, Thread/unthread left pants leg, Pull pants up/down, Fasten/unfasten pants Pants- Performed by helper: Thread/unthread left pants leg Non-skid slipper socks- Performed  by patient: Don/doff left sock, Don/doff right sock Non-skid slipper socks- Performed by helper: Don/doff right sock, Don/doff left sock               TED Hose - Performed by helper: Don/doff left TED hose, Don/doff right TED hose  Lower body assist Assist for lower body dressing: Touching or steadying assistance (Pt > 75%)      Toileting Toileting Toileting activity did not occur: No continent bowel/bladder event Toileting steps completed by patient: Adjust clothing prior to toileting, Performs perineal hygiene, Adjust clothing after toileting Toileting steps completed by helper: Adjust clothing after toileting, Adjust clothing prior to toileting Toileting Assistive Devices: Other (comment) (elevated seat & rails, RW)  Toileting assist Assist level: Touching or steadying assistance (Pt.75%)   Transfers Chair/bed transfer Chair/bed transfer activity did not occur: Safety/medical concerns Chair/bed transfer method: Stand pivot Chair/bed transfer assist level: Touching or steadying assistance (Pt > 75%) Chair/bed transfer assistive device: Walker, Armrests Mechanical lift: Landscape architect Ambulation activity did not occur: Safety/medical concerns   Max distance: 148ft Assist level: Touching or steadying assistance (Pt > 75%)   Wheelchair   Type: Manual Max wheelchair distance: 119ft Assist Level: Supervision or verbal cues  Cognition Comprehension Comprehension assist level: Follows complex conversation/direction with extra time/assistive device  Expression Expression assist level: Expresses complex 90% of the time/cues < 10% of the time  Social Interaction Social Interaction assist level: Interacts appropriately 90% of the time - Needs monitoring or encouragement for participation or interaction.  Problem Solving Problem solving assist level: Solves basic 90% of the time/requires cueing < 10% of the time  Memory Memory assist level: Recognizes or recalls 75 -  89% of the time/requires cueing 10 - 24% of the time    Medical Problem List and Plan: 1.  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  DVT Prophylaxis/Anticoagulation: Mechanical: Sequential compression devices, below knee Bilateral lower extremities 3. Pain Management: Tylenol prn 4. Mood: LCSW to follow for evaluation and support.  5. Neuropsych: This patient is not fully capable of making decisions on her own behalf. 6. Skin/Wound Care: Ordered air mattress overlay due to malnutrition, poor intake   2 sacral decubiti, pressure relief, keep dry, protein supplementation, foam dressing 7. Fluids/Electrolytes/Nutrition:  Continue dextrose for now.   Liquid diet advanced to soft diet at present, will cont to advance as tolerated 8. HIV/AIDS with MAC?:  On descovy, ticovey  Biaxin and ethambutol d/ced per ID on 8/9.   Follow up per ID. Appreciate recs 9. Candida/CMV esophagitis:   Ganciclovir d/ced per ID on 8/24.   Fluconazole restarted on 8/24 per ID 10. FTT: Continue IVF for now.   Appetite improving  11. Potential for pancytopenia/fluctuation:  Check labs periodically given medications  GCSF to support marrow at least twice a week per ID 12. Hyponatremia/Hypokalemia: Monitor lytes.   Na+132 on 8/24: Will cont to monitor, treat if necessary  K+ 3.9 on 8/24.   Cont to monitor  Labs ordered for Monday 13. PCP prophylaxis: Bactrim discontinued due to hyponatremia. Has been treated for PCP  infection and dapsone resumed 8/4  for prophylaxis.  14. Hypoalbuminemia  Supplement started 8/6 15. Leukocytosis: Resolved, afebrile- will contact ID if pt becomes febrile 16. Anemia of chronic disease  Hb 9.2 on 8/24  Labs ordered for Monday  Cont to monitor 17. Orthostasis: Improving  TEDS, abdominal binder  IVF qHS completed x3 days  Encouraged more fluids 18. Neutropenia  Will cont to monitor  LOS (Days) 21 A FACE TO FACE EVALUATION WAS  PERFORMED  Elis Rawlinson Karis Jubanil Vernel Donlan 12/16/2015 10:37 AM

## 2015-12-16 NOTE — Progress Notes (Signed)
Physical Therapy Session Note  Patient Details  Name: Samantha Kline MRN: [id number] Date of Birth: Jun 25, 1960  Today's Date: 12/16/2015 PT Individual Time: 1301-1400 PT Individual Time Calculation (min): 59 min    Short Term Goals: Week 2:  PT Short Term Goal 1 (Week 2): Pt will maintain static & dynamic sitting without back support consistently with  Mod A. PT Short Term Goal 1 - Progress (Week 2): Met PT Short Term Goal 2 (Week 2): Pt will transfer supine<>sitting with use of hospital bed features & mod A. PT Short Term Goal 2 - Progress (Week 2): Met PT Short Term Goal 3 (Week 2): Pt will perform bed<>chair transfers with consistent Max A +1.  PT Short Term Goal 3 - Progress (Week 2): Met PT Short Term Goal 4 (Week 2): Pt will ambulate 10 ft in parallel bars with Max A. PT Short Term Goal 4 - Progress (Week 2): Met Week 3:  PT Short Term Goal 1 (Week 3): Pt will maintain standing balance with Mod A. PT Short Term Goal 2 (Week 3): Pt will demonstrate bed mobility (supine<>sit, rolling L<>R) consistently with supervision and hospital bed features.  PT Short Term Goal 3 (Week 3): Pt will ambulate 35 ft with LRAD & min A.  PT Short Term Goal 4 (Week 3): Pt will negotiate 4 steps (6") with B rails & min A for strengthening.  Skilled Therapeutic Interventions/Progress Updates:     Patient received supine in bed and agreeable to PT. Patient noted to already have binder and ted hose in place.   Supine>sit transfer with distant supervision from PT and only slight use of bed features.    PT instructed patient in stand pivot transfer with min A from PT, and min cues for proper use of UE for safety with transfer.    WC mobility for 259f with supervision A from PT. Only min cues required on this day for improved equal use of BUE to maintain straight trajectory.    Car transfer with min A from PT and use of RW. PT provided min cues for safe entry into car with sit then spine technique rather  than SLS technique.    Step training up/down 6 inch step x 5 BLE with BUE support on rails and min A from PT.   After 2 minute rest break patient had an Emesis episode following stair training. BP Assessed 140/86, HR 85. Patient reports not feeling sick, lightheaded, dizzy or nauseous once emesis was completed. 5 minute rest break to make sure no return to symptoms.   Gait training to room for 137fwith min A from PT and use of RW. Min cues for increased step width to prevent scissoring.   Gait in room for 107fo recliner with RW and close supervision From PT.   Throughout treatment patient performed sit<>stand x 8 with supervision A from PT and mod cues for proper UE positioning for increased safety with transfer  Left sitting in recliner with call bell in reach.   Therapy Documentation Precautions:  Precautions Precautions: Fall Restrictions Weight Bearing Restrictions: No General:   Vital Signs: Therapy Vitals Pulse Rate: (!) 116 Pain: Pain Assessment Pain Assessment: No/denies pain   See Function Navigator for Current Functional Status.   Therapy/Group: Individual Therapy  AusLorie Phenix25/2017, 2:42 PM

## 2015-12-16 NOTE — Progress Notes (Addendum)
Social Work Patient ID: Samantha Kline, female   DOB: 18-Dec-1960, 55 y.o.   MRN: [id number]  Spoke with [name]-[employer] has received updated clinicals and has approved her until next Friday 9/1.  Feels family education can be completed next week and be prepared for discharge on Friday instead of Sat. Have informed staff and pt who is in agreement. Will have family come in next week and Work on discharge needs-ie equipment and follow up. Husband was here in pt;s room and discussed him coming in next week he will look at his schedule and figure out which day will work better for him. He will be taking time off to be with her at discharge.

## 2015-12-16 NOTE — Progress Notes (Signed)
Speech Language Pathology Daily Session Note  Patient Details  Name: Samantha Kline MRN: [id number] Date of Birth: 02/23/1961  Today's Date: 12/16/2015 SLP Individual Time: 0903-1000 SLP Individual Time Calculation (min): 57 min   Short Term Goals: Week 3: SLP Short Term Goal 1 (Week 3): Pt will utilize increased vocal intensity to achieve intelligibility at the sentence level with min assist verbal cues.  SLP Short Term Goal 2 (Week 3): Patient will problem solve use of call bell with supervision verbal cues  SLP Short Term Goal 3 (Week 3): Patient will utilize external aids to recall daily information with supervision cues.  SLP Short Term Goal 4 (Week 3): Patient will demonstrate sustained attention to familiar tasks for 5 minutes with supervision verbal cues for redirection  SLP Short Term Goal 5 (Week 3): Pt will consume soft diet with mod I use of standard swallowing precautions over three targeted sessions and with  minimal overt s/s of aspiration.    Skilled Therapeutic Interventions:  Pt was seen for skilled ST targeting cognitive goals.  SLP facilitated the session with a novel card game targeting use of memory compensatory strategies (association).  Pt generated and recalled word-picture associations with mod I.  Pt was then able to recall previously named category members during the abovementioned task with mod I for 100% accuracy.  Pt continues to demonstrate impaired recall of daily information but is making improvements in use of compensatory strategies.  Pt requested to have memory notebook left within reach at the end of today's therapy session.  Pt left in wheelchair with call bell within reach.  Continue per current plan of care.    Function:  Eating Eating                 Cognition Comprehension Comprehension assist level: Follows complex conversation/direction with extra time/assistive device  Expression   Expression assist level: Expresses complex ideas: With  extra time/assistive device  Social Interaction Social Interaction assist level: Interacts appropriately with others with medication or extra time (anti-anxiety, antidepressant).  Problem Solving Problem solving assist level: Solves basic 90% of the time/requires cueing < 10% of the time  Memory Memory assist level: Recognizes or recalls 75 - 89% of the time/requires cueing 10 - 24% of the time    Pain Pain Assessment Pain Assessment: No/denies pain  Therapy/Group: Individual Therapy  Jacson Rapaport, [name] 12/16/2015, 3:46 PM

## 2015-12-17 ENCOUNTER — Inpatient Hospital Stay (HOSPITAL_COMMUNITY): Payer: Managed Care, Other (non HMO) | Admitting: Occupational Therapy

## 2015-12-17 ENCOUNTER — Inpatient Hospital Stay (HOSPITAL_COMMUNITY): Payer: Managed Care, Other (non HMO) | Admitting: *Deleted

## 2015-12-17 ENCOUNTER — Inpatient Hospital Stay (HOSPITAL_COMMUNITY): Payer: Managed Care, Other (non HMO)

## 2015-12-17 ENCOUNTER — Inpatient Hospital Stay (HOSPITAL_COMMUNITY): Payer: Managed Care, Other (non HMO) | Admitting: Speech Pathology

## 2015-12-17 DIAGNOSIS — W19XXXA Unspecified fall, initial encounter: Secondary | ICD-10-CM | POA: Insufficient documentation

## 2015-12-17 DIAGNOSIS — S63283A Dislocation of proximal interphalangeal joint of left middle finger, initial encounter: Secondary | ICD-10-CM

## 2015-12-17 MED ORDER — SODIUM BICARBONATE 4 % IV SOLN
5.0000 mL | Freq: Once | INTRAVENOUS | Status: DC
  Filled 2015-12-17: qty 5

## 2015-12-17 MED ORDER — LIDOCAINE-EPINEPHRINE 1 %-1:100000 IJ SOLN
10.0000 mL | Freq: Once | INTRAMUSCULAR | Status: DC
  Filled 2015-12-17: qty 10

## 2015-12-17 MED ORDER — LIDOCAINE-EPINEPHRINE 1 %-1:100000 IJ SOLN
20.0000 mL | Freq: Once | INTRAMUSCULAR | Status: DC
  Filled 2015-12-17: qty 20

## 2015-12-17 NOTE — Progress Notes (Signed)
[place] PHYSICAL MEDICINE & REHABILITATION     PROGRESS NOTE  Subjective/Complaints:  Pt laying in bed this AM.  She is very alert and engaged this AM, with husband at bedside.  She had a fall this AM,but she notes it was light and soft and denies pain.    ROS: Denies CP, SOB, N/V/D.  Objective: Vital Signs: Blood pressure 135/81, pulse (!) 120, temperature 97.3 F (36.3 C), temperature source Oral, resp. rate 18, height 5\' 4"  (1.626 m), weight 46.3 kg (102 lb), SpO2 100 %. No results found.  Recent Labs  12/15/15 0533  WBC 4.1  HGB 9.2*  HCT 29.3*  PLT 318    Recent Labs  12/15/15 0533  NA 132*  K 3.9  CL 104  GLUCOSE 82  BUN 10  CREATININE 0.77  CALCIUM 8.2*   CBG (last 3)  No results for input(s): GLUCAP in the last 72 hours.  Wt Readings from Last 3 Encounters:  12/11/15 46.3 kg (102 lb)  11/23/15 60.6 kg (133 lb 11.2 oz)  10/31/15 58.6 kg (129 lb 3 oz)    Physical Exam:  BP 135/81 (BP Location: Right Arm)   Pulse (!) 120   Temp 97.3 F (36.3 C) (Oral)   Resp 18   Ht 5\' 4"  (1.626 m)   Wt 46.3 kg (102 lb)   SpO2 100%   BMI 17.51 kg/m  Constitutional: Vital signs are normal. NAD. She appears cachectic. She has a sickly appearance.  HENT: Normocephalic and atraumatic.  Eyes: Conjunctivae and EOM are normal.  Cardiovascular: Regular rate and rhythm. No murmur heard. Respiratory: Effort normal and breath sounds normal. No stridor. No respiratory distress. She has no wheezes.  GI: Soft. Bowel sounds are normal. She exhibits no distension. There is no tenderness.  Musculoskeletal: She exhibits no edema or tenderness.  Muscle wasting all limbs.  Neurological:  A&O X3. Motor:  B/l UE: 4+/5 deltoid, biceps, triceps, wrist and HI (improving)  B/l LE: 4/5 HF, 4+/5 KE/KF and 5/5 ADF/PF (improving, Right stronger than left)  Skin: Skin is warm and dry. No rash noted. No erythema.  2 sacral decubitus ulcers with moisture associated ulcers healing (not  examined today).  Psychiatric: Her affect is normal. Cognition and memory appear normal.   Assessment/Plan: 1. 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 Physiatrist is providing close team supervision and 24 hour management of active medical problems listed below. Physiatrist and rehab team continue to assess barriers to discharge/monitor patient progress toward functional and medical goals.  Function:  Bathing Bathing position   Position: Shower  Bathing parts Body parts bathed by patient: Right arm, Left arm, Chest, Abdomen, Front perineal area, Buttocks, Right upper leg, Left upper leg, Right lower leg, Left lower leg Body parts bathed by helper: Back  Bathing assist Assist Level: Supervision or verbal cues   Set up : To adjust water temperature, To open containers  Upper Body Dressing/Undressing Upper body dressing   What is the patient wearing?: Pull over shirt/dress     Pull over shirt/dress - Perfomed by patient: Thread/unthread right sleeve, Thread/unthread left sleeve, Put head through opening, Pull shirt over trunk Pull over shirt/dress - Perfomed by helper: Put head through opening Button up shirt - Perfomed by patient: Thread/unthread left sleeve Button up shirt - Perfomed by helper: Thread/unthread right sleeve, Pull shirt around back, Button/unbutton shirt    Upper body assist Assist  Level: Set up   Set up : To obtain clothing/put away  Lower Body Dressing/Undressing Lower body dressing   What is the patient wearing?: Pants, Non-skid slipper socks, Ted Hose, Underwear Underwear - Performed by patient: Thread/unthread right underwear leg, Thread/unthread left underwear leg, Pull underwear up/down Underwear - Performed by helper: Thread/unthread right underwear leg, Thread/unthread left underwear leg, Pull underwear up/down Pants- Performed by patient:  Thread/unthread right pants leg, Thread/unthread left pants leg, Pull pants up/down, Fasten/unfasten pants Pants- Performed by helper: Thread/unthread left pants leg Non-skid slipper socks- Performed by patient: Don/doff left sock, Don/doff right sock Non-skid slipper socks- Performed by helper: Don/doff right sock, Don/doff left sock               TED Hose - Performed by helper: Don/doff left TED hose, Don/doff right TED hose  Lower body assist Assist for lower body dressing: Touching or steadying assistance (Pt > 75%)      Toileting Toileting Toileting activity did not occur: No continent bowel/bladder event Toileting steps completed by patient: Adjust clothing prior to toileting, Performs perineal hygiene, Adjust clothing after toileting Toileting steps completed by helper: Adjust clothing after toileting, Adjust clothing prior to toileting Toileting Assistive Devices: Grab bar or rail  Toileting assist Assist level: Supervision or verbal cues   Transfers Chair/bed transfer Chair/bed transfer activity did not occur: Safety/medical concerns Chair/bed transfer method: Stand pivot Chair/bed transfer assist level: Touching or steadying assistance (Pt > 75%) Chair/bed transfer assistive device: Armrests, Walker Mechanical lift: Landscape architecttedy   Locomotion Ambulation Ambulation activity did not occur: Safety/medical concerns   Max distance: 135 Assist level: Touching or steadying assistance (Pt > 75%)   Wheelchair   Type: Manual Max wheelchair distance: 28600ft Assist Level: Supervision or verbal cues  Cognition Comprehension Comprehension assist level: Follows complex conversation/direction with extra time/assistive device  Expression Expression assist level: Expresses complex ideas: With extra time/assistive device  Social Interaction Social Interaction assist level: Interacts appropriately with others with medication or extra time (anti-anxiety, antidepressant).  Problem Solving  Problem solving assist level: Solves basic 90% of the time/requires cueing < 10% of the time  Memory Memory assist level: Recognizes or recalls 75 - 89% of the time/requires cueing 10 - 24% of the time    Medical Problem List and Plan: 1.  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  DVT Prophylaxis/Anticoagulation: Mechanical: Sequential compression devices, below knee Bilateral lower extremities 3. Pain Management: Tylenol prn 4. Mood: LCSW to follow for evaluation and support.  5. Neuropsych: This patient is not fully capable of making decisions on her own behalf. 6. Skin/Wound Care: Ordered air mattress overlay due to malnutrition, poor intake   2 sacral decubiti, pressure relief, keep dry, protein supplementation, foam dressing 7. Fluids/Electrolytes/Nutrition:  Continue dextrose for now.   Liquid diet advanced to soft diet at present, will cont to advance as tolerated 8. HIV/AIDS with MAC?:  On descovy, ticovey  Biaxin and ethambutol d/ced per ID on 8/9.   Follow up per ID. Appreciate recs 9. Candida/CMV esophagitis:   Ganciclovir d/ced per ID on 8/24.   Fluconazole restarted on 8/24 per ID 10. FTT: Continue IVF for now.   Appetite improving  11. Potential for pancytopenia/fluctuation:  Check labs periodically given medications  GCSF to support marrow at least twice a week per ID 12. Hyponatremia/Hypokalemia: Monitor lytes.   Na+132 on 8/24: Will cont to monitor, treat if necessary  K+ 3.9 on 8/24.  Cont to monitor  Labs ordered for Monday 13. PCP prophylaxis: Bactrim discontinued due to hyponatremia. Has been treated for PCP infection and dapsone resumed 8/4  for prophylaxis.  14. Hypoalbuminemia  Supplement started 8/6 15. Leukocytosis: Resolved, afebrile- will contact ID if pt becomes febrile 16. Anemia of chronic disease  Hb 9.2 on 8/24  Labs ordered for Monday  Cont to monitor 17. Orthostasis: Improving  TEDS,  abdominal binder  IVF qHS completed x3 days  Encouraged more fluids 18. Neutropenia  Will cont to monitor 19. Fall  Pt states she was startled, and indicates it was a "soft" fall.  Denies pain.  LOS (Days) 22 A FACE TO FACE EVALUATION WAS PERFORMED  Ankit Karis Juba 12/17/2015 11:54 AM

## 2015-12-17 NOTE — Progress Notes (Signed)
Physical Therapy Session Note  Patient Details  Name: Samantha Kline MRN: [id number] Date of Birth: [birth year]  Today's Date: 12/17/2015 PT Individual Time: 0900-1000 PT Individual Time Calculation (min): 60 min      Skilled Therapeutic Interventions/Progress Updates:  No pain reported during session.  Patient in bed at the beginning of the session, agrees to therapy, Ted hose donned on , supine to sit EOB with Supervision, ambulatory transfer to w/c with Supervision. Patient donned pants with just cues for balance when standing. Poor standing balance when using both UE to manipulate pants.  Patient's breakfast delivered late, and she wanted to eat before leaving the room.  Gait training with FWW x 200 feet with Min Guard and cues for wide steps and to reduce scissoring.  Training n stairs negotiation 1 x 8 short steps and 4 tall and back, with min A for safety and cues for step to pattern and appropriate feet positioning.  NuStep x 10 min with level 5 resistance -in order to facilitate strength, coordination and reciprocal movement. Resistance exercises in sitting with green theraband in order to improve strength.  Patient participated in gait training back to room with theraband added to both LE in order to increase step width and train strength in abductors.    Therapy Documentation Precautions:  Precautions Precautions: Fall Restrictions Weight Bearing Restrictions: No   See Function Navigator for Current Functional Status.   Therapy/Group: Individual Therapy  Dorna MaiCzajkowska, Korbyn Vanes W 12/17/2015, 10:47 AM

## 2015-12-17 NOTE — Progress Notes (Signed)
   12/17/15 0545  What Happened  Was fall witnessed? Yes  Who witnessed fall? Maralyn SagoSarah, NT Maralyn Sago(Sarah, NT)  Patients activity before fall other (comment) (standing at bedside)  Point of contact head;arm/shoulder;hip/leg (top of head, and left shoulder and hip)  Was patient injured? No (no obvious injuries, patient reports, "I'm fine")  Follow Up  MD notified Dr. Allena KatzPatel  Time MD notified [phone] (Dr. Allena KatzPatel on unit at time of fall)  Family notified Montel Culver(Andrew Farnan, husband at bedside)  Time family notified [phone]  Additional tests (not at this time)  Adult Fall Risk Assessment  Risk Factor Category (scoring not indicated) Fall has occurred during this admission (document High fall risk)  Patient's Fall Risk High Fall Risk (>13 points)  Adult Fall Risk Interventions  Required Bundle Interventions *See Row Information* High fall risk - low, moderate, and high requirements implemented  Vitals  Temp 98.3 F (36.8 C)  Temp Source Oral  BP 118/76  BP Location Left Arm  BP Method Automatic  Patient Position (if appropriate) Lying  Pulse Rate 99  Pulse Rate Source Dinamap  Resp 19  Oxygen Therapy  SpO2 100 %  O2 Device Room Air   Standing beside bed, reached for side rail to balance self, missed side rail and lost her balance. Hit top of head, left shoulder and left hip. No obvious injuries visualized. Husband at bedside and aware of fall. Patient reports, "I'm fine, it just scared me." Dr. Allena KatzPatel on unit, made aware. Instructed patient to notify staff with any new complaints. Alfredo MartinezMurray, Karry Causer A

## 2015-12-17 NOTE — Consult Note (Signed)
Reason for Consult:left long pipj dislocation Referring Physician: Daniya Aramburo is an 55 y.o. female.  HPI: s/p fall this am with pain and deformity to left long finger with xray positive for pipj doslocation  Past Medical History:  Diagnosis Date  . Anemia   . HIV (human immunodeficiency virus infection) (HCC)    11/06/15 Family states it was just diagnosed this week  . Pneumonia 10/24/2015  . Vitamin D deficiency     Past Surgical History:  Procedure Laterality Date  . ESOPHAGOGASTRODUODENOSCOPY N/A 11/23/2015   Procedure: ESOPHAGOGASTRODUODENOSCOPY (EGD);  Surgeon: Vida Rigger, MD;  Location: [facility] ENDOSCOPY;  Service: Endoscopy;  Laterality: N/A;  . TUBAL LIGATION  1980s    Family History  Problem Relation Age of Onset  . Diabetes Father   . Heart attack Brother   . Hypertension Sister     Social History:  reports that she has never smoked. She has never used smokeless tobacco. She reports that she drinks alcohol. She reports that she does not use drugs.  Allergies:  Allergies  Allergen Reactions  . Septra [Sulfamethoxazole-Trimethoprim]     Possible cause of hyponatremia    Medications:  Scheduled: . dapsone  100 mg Oral Daily  . dimethicone   Topical TID PC  . dolutegravir  50 mg Oral Q1200  . dronabinol  5 mg Oral BID AC  . emtricitabine-tenofovir AF  1 tablet Oral Q1200  . feeding supplement (ENSURE ENLIVE)  237 mL Oral BID BM  . feeding supplement (PRO-STAT SUGAR FREE 64)  30 mL Oral BID  . ferrous sulfate  325 mg Oral Q breakfast  . fluconazole  100 mg Oral Weekly  . Gerhardt's butt cream   Topical BID  . lidocaine-EPINEPHrine  20 mL Infiltration Once  . mouth rinse  15 mL Mouth Rinse BID  . multivitamin with minerals  1 tablet Oral Daily  . MUSCLE RUB   Topical BID  . potassium chloride  20 mEq Oral BID  . sodium bicarbonate  5 mL Intravenous Once  . sodium chloride 0.9 % 1,000 mL infusion   Intravenous Daily  . vitamin B-12  100 mcg Oral Daily  .  Vitamin D (Ergocalciferol)  50,000 Units Oral Q Fri    No results found for this or any previous visit (from the past 48 hour(s)).  Dg Finger Middle Left  Result Date: 12/17/2015 CLINICAL DATA:  Left middle finger a after a fall today. Postreduction. EXAM: LEFT MIDDLE FINGER 2+V COMPARISON:  12/17/2015 FINDINGS: Persistent complete posterior dislocation of the middle phalanx of the left third finger with respect to the proximal phalanx. Overriding of the middle phalanx of about 6.3 mm. No acute fracture identified. IMPRESSION: Persistent complete dislocation of the proximal interphalangeal joint of the left third finger. Electronically Signed   By: Burman Nieves M.D.   On: 12/17/2015 23:03   Dg Finger Middle Left  Result Date: 12/17/2015 CLINICAL DATA:  Left middle finger swelling and pain 1 day after fall. Initial encounter. EXAM: LEFT MIDDLE FINGER 2+V COMPARISON:  None. FINDINGS: Three views study shows PIP joint dislocation with base of middle phalanx dislocated posteriorly. No associated fracture fragments are evident. IMPRESSION: PIP joint dislocation. Electronically Signed   By: Kennith Center M.D.   On: 12/17/2015 16:15    Review of Systems  All other systems reviewed and are negative.  Blood pressure 114/69, pulse (!) 122, temperature 98.4 F (36.9 C), temperature source Oral, resp. rate 18, height 5\' 4"  (1.626 m),  weight 46.3 kg (102 lb), SpO2 98 %. Physical Exam  Constitutional: She appears well-developed and well-nourished.  HENT:  Head: Normocephalic and atraumatic.  Neck: Normal range of motion.  Cardiovascular: Normal rate.   Respiratory: Effort normal.  Musculoskeletal:       Left hand: She exhibits tenderness, bony tenderness, deformity and swelling.  Left long pipj pain and swelling s/p fall  Neurological: She is alert.  Skin: Skin is warm.  Psychiatric: She has a normal mood and affect. Her behavior is normal. Judgment and thought content normal.     Assessment/Plan: As above  Digital block and then median nerve block per formed at bedside   Patient reports numbness but will not let reduction to take place due to pain  Will post for OR tomorrow for closed vs open reduction  Cheston Coury A 12/17/2015, 11:21 PM

## 2015-12-17 NOTE — Procedures (Signed)
Procedure Note: HPI: Pt with fall earlier this AM.  At that time did not complain of pain.  Late afternoon nursing noted edema and pain with manipulation of finger. Xray ordered, reviewed, suggesting dorsal PIP dislocation of left middle finger.    Procedure: Wrist placed in slight extension, traction applied distal to PIP joint with attempt to manually reduce x2.  Pt with significant pain and further attempts deferred. Repeat xray ordered. No complications  Plan: Await xray results. Spoke to hand surgery for evaluation as well.  Appreciate assistance.

## 2015-12-17 NOTE — Progress Notes (Signed)
New swelling noted on middle finger, left hand. Patient states it was increasingly painful. MD notified, orders given for x-tray. X-ray shows dislocation. MD notified, awaiting orders.

## 2015-12-17 NOTE — Progress Notes (Signed)
Occupational Therapy Session Note  Patient Details  Name: Lorna FewHythia Crenshaw MRN: [id number] Date of Birth: [birth year]  Today's Date: 12/17/2015 OT Individual Time: 1400-1510 OT Individual Time Calculation (min): 70 min   Short Term Goals: Week 4:  OT Short Term Goal 1 (Week 4): Pt will tolerate standing at the sink for grooming task for 8 mins without rest break  OT Short Term Goal 2 (Week 4): Pt will ambulate into the bathroom and transfer on/off toilet with Mod A OT Short Term Goal 3 (Week 4): Pt will don/doff TED hose with Min A OT Short Term Goal 4 (Week 4): Pt will maintain dynamic standing balance while reaching outside base of support with Min A  Skilled Therapeutic Interventions/Progress Updates:   Patient sleeping upon approachfor therapy but partcipated in follows:    Toilet transfer via rolling walker =distant supervision.   Toileting=supervision, including cleansing after BM and applying skin barrier cream.   As well, she completed seated endurance acivities with rest breaks.   THis clinician and patient noticed her  Left middle digit swollen when she used the armitron in gym.    Patient stated the swelling had suddenly appeared this afternoon.  Patient stated she thought she jammed or hurt it when she fell this early morning during a w/c to bed transfer.   RN was informed.  Therapy Documentation Precautions:  Precautions Precautions: Fall Restrictions Weight Bearing Restrictions: No Pain:denied   See Function Navigator for Current Functional Status.   Therapy/Group: Individual Therapy  Bud Faceickett, Aldina Porta [facility] 12/17/2015, 4:24 PM

## 2015-12-17 NOTE — Progress Notes (Signed)
Occupational Therapy Session Note  Patient Details  Name: Samantha Kline MRN: [id number] Date of Birth: 01/03/1961  Today's Date: 12/17/2015 OT Individual Time: 1130-1230 OT Individual Time Calculation (min): 60 min   Short Term Goals: Week 3:  OT Short Term Goal 1 (Week 3): Pt will complete functional shower transfer with Max A OT Short Term Goal 1 - Progress (Week 3): Met OT Short Term Goal 2 (Week 3): Pt will complete lower body dressing with Mod A and no rest breaks OT Short Term Goal 2 - Progress (Week 3): Met OT Short Term Goal 3 (Week 3): Pt will engage in 5 minutes of therapeutic activity without rest to increase activity tolerance for self-care tasks OT Short Term Goal 3 - Progress (Week 3): Met OT Short Term Goal 4 (Week 3): Pt will tolerate standing at the sink for grooming task for 2 munites with moderate assistance for standing balance OT Short Term Goal 4 - Progress (Week 3): Met OT Short Term Goal 5 (Week 3): Pt will be given HEP OT Short Term Goal 5 - Progress (Week 3): Met  Skilled Therapeutic Interventions/Progress Updates:   Patient seen for shower with following status:    W/c to shower transfer (via walker and tub bench chair and grab bars) with supervision.   Patient completed bathing seated on tub bench with lateral leans to wash and rinse buttocks. Patient completed dressing with Min A for donning tight tredded socks due to fatigue.  Patient required 2 short rest breaks during the 60 minute session.  Therapy Documentation Precautions:  Precautions Precautions: Fall Restrictions Weight Bearing Restrictions: No   Pain:denied   See Function Navigator for Current Functional Status.   Therapy/Group: Individual Therapy  [name] 12/17/2015, 3:51 PM

## 2015-12-17 NOTE — Plan of Care (Signed)
Problem: RH SAFETY Goal: RH STG DECREASED RISK OF FALL WITH ASSISTANCE STG Decreased Risk of Fall With Assistance.  Outcome: Not Progressing Fall occurred

## 2015-12-18 ENCOUNTER — Inpatient Hospital Stay (HOSPITAL_COMMUNITY): Payer: Managed Care, Other (non HMO) | Admitting: Physical Therapy

## 2015-12-18 ENCOUNTER — Inpatient Hospital Stay (HOSPITAL_COMMUNITY): Payer: Managed Care, Other (non HMO) | Admitting: Occupational Therapy

## 2015-12-18 ENCOUNTER — Inpatient Hospital Stay (HOSPITAL_COMMUNITY): Payer: Managed Care, Other (non HMO) | Admitting: Anesthesiology

## 2015-12-18 ENCOUNTER — Ambulatory Visit: Admit: 2015-12-18 | Payer: Managed Care, Other (non HMO) | Admitting: Orthopedic Surgery

## 2015-12-18 ENCOUNTER — Encounter (HOSPITAL_COMMUNITY)
Admission: RE | Disposition: A | Discharge status: Home Health | Payer: Self-pay | Source: Intra-hospital | Attending: Physical Medicine & Rehabilitation

## 2015-12-18 DIAGNOSIS — Z5189 Encounter for other specified aftercare: Secondary | ICD-10-CM

## 2015-12-18 DIAGNOSIS — IMO0002 Reserved for concepts with insufficient information to code with codable children: Secondary | ICD-10-CM

## 2015-12-18 DIAGNOSIS — M79645 Pain in left finger(s): Secondary | ICD-10-CM | POA: Insufficient documentation

## 2015-12-18 DIAGNOSIS — W19XXXD Unspecified fall, subsequent encounter: Secondary | ICD-10-CM

## 2015-12-18 HISTORY — PX: CLOSED REDUCTION FINGER WITH PERCUTANEOUS PINNING: SHX5612

## 2015-12-18 LAB — SURGICAL PCR SCREEN
MRSA, PCR: NEGATIVE
Staphylococcus aureus: NEGATIVE

## 2015-12-18 SURGERY — CLOSED REDUCTION, FINGER, WITH PERCUTANEOUS PINNING
Anesthesia: General | Site: Finger | Laterality: Left

## 2015-12-18 MED ORDER — PROPOFOL 10 MG/ML IV BOLUS
INTRAVENOUS | Status: DC | PRN
  Administered 2015-12-18: 120 mg via INTRAVENOUS

## 2015-12-18 MED ORDER — PROPOFOL 10 MG/ML IV BOLUS
INTRAVENOUS | Status: AC
  Filled 2015-12-18: qty 20

## 2015-12-18 MED ORDER — FENTANYL CITRATE (PF) 100 MCG/2ML IJ SOLN: 25.0000 ug | INTRAMUSCULAR | Status: DC | PRN

## 2015-12-18 MED ORDER — ONDANSETRON HCL 4 MG/2ML IJ SOLN: 4.0000 mg | Freq: Once | INTRAMUSCULAR | Status: DC | PRN

## 2015-12-18 MED ORDER — FENTANYL CITRATE (PF) 100 MCG/2ML IJ SOLN
INTRAMUSCULAR | Status: AC
  Filled 2015-12-18: qty 2

## 2015-12-18 MED ORDER — LACTATED RINGERS IV SOLN
INTRAVENOUS | Status: DC | PRN
  Administered 2015-12-18: 11:00:00 via INTRAVENOUS

## 2015-12-18 MED ORDER — OXYCODONE HCL 5 MG/5ML PO SOLN: 5.0000 mg | Freq: Once | ORAL | Status: DC | PRN

## 2015-12-18 MED ORDER — OXYCODONE HCL 5 MG PO TABS: 5.0000 mg | ORAL_TABLET | Freq: Once | ORAL | Status: DC | PRN

## 2015-12-18 MED ORDER — OXYCODONE HCL 5 MG PO TABS
5.0000 mg | ORAL_TABLET | Freq: Four times a day (QID) | ORAL | Status: DC | PRN
  Administered 2015-12-18: 5 mg via ORAL
  Filled 2015-12-18: qty 1

## 2015-12-18 MED ORDER — CHLORHEXIDINE GLUCONATE 4 % EX LIQD
60.0000 mL | Freq: Once | CUTANEOUS | Status: AC
  Administered 2015-12-18: 4 via TOPICAL
  Filled 2015-12-18: qty 60

## 2015-12-18 SURGICAL SUPPLY — 37 items
BANDAGE ACE 4X5 VEL STRL LF (GAUZE/BANDAGES/DRESSINGS) ×3 IMPLANT
BANDAGE ELASTIC 3 VELCRO ST LF (GAUZE/BANDAGES/DRESSINGS) ×3 IMPLANT
BLADE SURG ROTATE 9660 (MISCELLANEOUS) IMPLANT
BNDG CMPR 9X4 STRL LF SNTH (GAUZE/BANDAGES/DRESSINGS)
BNDG ESMARK 4X9 LF (GAUZE/BANDAGES/DRESSINGS) IMPLANT
BNDG GAUZE ELAST 4 BULKY (GAUZE/BANDAGES/DRESSINGS) ×3 IMPLANT
CORDS BIPOLAR (ELECTRODE) IMPLANT
COVER SURGICAL LIGHT HANDLE (MISCELLANEOUS) ×3 IMPLANT
CUFF TOURNIQUET SINGLE 18IN (TOURNIQUET CUFF) ×3 IMPLANT
CUFF TOURNIQUET SINGLE 24IN (TOURNIQUET CUFF) IMPLANT
DRAPE OEC MINIVIEW 54X84 (DRAPES) ×3 IMPLANT
DRAPE SURG 17X23 STRL (DRAPES) ×3 IMPLANT
GAUZE SPONGE 4X4 12PLY STRL (GAUZE/BANDAGES/DRESSINGS) ×3 IMPLANT
GAUZE XEROFORM 1X8 LF (GAUZE/BANDAGES/DRESSINGS) ×3 IMPLANT
GLOVE SURG SYN 8.0 (GLOVE) ×3 IMPLANT
GOWN STRL REUS W/ TWL LRG LVL3 (GOWN DISPOSABLE) ×4 IMPLANT
GOWN STRL REUS W/ TWL XL LVL3 (GOWN DISPOSABLE) ×1 IMPLANT
GOWN STRL REUS W/TWL LRG LVL3 (GOWN DISPOSABLE) ×12
GOWN STRL REUS W/TWL XL LVL3 (GOWN DISPOSABLE) ×3
KIT BASIN OR (CUSTOM PROCEDURE TRAY) ×3 IMPLANT
KIT ROOM TURNOVER OR (KITS) ×3 IMPLANT
KIT SPLINT NASAL DENVER LRG BE (GAUZE/BANDAGES/DRESSINGS) ×3 IMPLANT
MANIFOLD NEPTUNE II (INSTRUMENTS) IMPLANT
NEEDLE HYPO 25GX1X1/2 BEV (NEEDLE) IMPLANT
NS IRRIG 1000ML POUR BTL (IV SOLUTION) ×3 IMPLANT
PACK ORTHO EXTREMITY (CUSTOM PROCEDURE TRAY) ×3 IMPLANT
PAD ARMBOARD 7.5X6 YLW CONV (MISCELLANEOUS) ×3 IMPLANT
PAD CAST 4YDX4 CTTN HI CHSV (CAST SUPPLIES) ×1 IMPLANT
PADDING CAST COTTON 4X4 STRL (CAST SUPPLIES) ×3
SPONGE LAP 4X18 X RAY DECT (DISPOSABLE) IMPLANT
SUT ETHILON 4 0 PS 2 18 (SUTURE) IMPLANT
SYR CONTROL 10ML LL (SYRINGE) IMPLANT
TOWEL OR 17X24 6PK STRL BLUE (TOWEL DISPOSABLE) ×3 IMPLANT
TOWEL OR 17X26 10 PK STRL BLUE (TOWEL DISPOSABLE) ×3 IMPLANT
TUBE CONNECTING 12'X1/4 (SUCTIONS)
TUBE CONNECTING 12X1/4 (SUCTIONS) IMPLANT
WATER STERILE IRR 1000ML POUR (IV SOLUTION) ×3 IMPLANT

## 2015-12-18 NOTE — Progress Notes (Addendum)
Occupational Therapy Session Note  Patient Details  Name: Samantha Kline MRN: [id number] Date of Birth: February 24, 1961  Today's Date: 12/18/2015 OT Individual Time: [id number] OT Individual Time Calculation (min): 31 min   Skilled Therapeutic Interventions/Progress Updates:   Pt participated in tx session focusing on energy conservation and safety awareness during self care mgt retraining. Pt returned from OR for L hand procedure. Pt reported feeling tired but agreeable to tx. Nursing also consulted prior to tx with report of MD orders to continue with rehab. Pt completed UB ADLs while seated at sink with extra time and rest breaks. Pt suddenly reported feeling nauseous and was provided emesis bag. Pt vomited into bag. Nursing notified for antinausea medication. Pt declined a drink of ginger ale which reportedly helped with her stomach. Pt reported wanting to continue with putting on her pants and socks. Pt completed at close supervision level. PT handoff at time of OT departure. No c/o pain. During session, pt educated on not WB L UE for safety after procedure with good carryover. No new MD orders regarding L UE at time of report.   60 minutes missed OT time due to pt being at OR during scheduled session this morning.   Therapy Documentation Precautions:  Precautions Precautions: Fall Restrictions Weight Bearing Restrictions: No General: General OT Amount of Missed Time: 60 Minutes Vital Signs:  Pain: Pain Assessment Pain Assessment: 0-10 Pain Score: 4  Pain Type: Acute pain Pain Location: Finger (Comment which one) (middle) Pain Orientation: Left Pain Descriptors / Indicators: Aching Pain Onset: On-going Pain Intervention(s): Medication (See eMAR) ADL: ADL Grooming: Setup, Contact guard Where Assessed-Grooming: Sitting at sink, Wheelchair Upper Body Bathing: Moderate assistance Where Assessed-Upper Body Bathing: Sitting at sink Lower Body Bathing: Maximal assistance Where  Assessed-Lower Body Bathing: Standing at sink Upper Body Dressing: Maximal assistance Where Assessed-Upper Body Dressing: Edge of bed Lower Body Dressing: Maximal assistance Where Assessed-Lower Body Dressing: Wheelchair Toileting: Dependent Where Assessed-Toileting: Bedside Commode Toilet Transfer: Moderate assistance Toilet Transfer Method: Stand pivot Toilet Transfer Equipment: AnimatorBedside commode Tub/Shower Transfer: Not assessed Exercises:   Other Treatments:    See Function Navigator for Current Functional Status.   Therapy/Group: Individual Therapy  Judah Carchi A Ilisa Hayworth 12/18/2015, 4:48 PM

## 2015-12-18 NOTE — Op Note (Signed)
See dictated note for [phone]

## 2015-12-18 NOTE — Anesthesia Procedure Notes (Signed)
Date/Time: 12/18/2015 10:55 AM Performed by: Tillman AbideHAWKINS, Beauty Pless B Pre-anesthesia Checklist: Patient identified, Emergency Drugs available and Suction available Patient Re-evaluated:Patient Re-evaluated prior to inductionOxygen Delivery Method: Circle system utilized Preoxygenation: Pre-oxygenation with 100% oxygen Intubation Type: IV induction Ventilation: Mask ventilation without difficulty Airway Equipment and Method: Oral airway Dental Injury: Teeth and Oropharynx as per pre-operative assessment  Difficulty Due To: Difficulty was unanticipated

## 2015-12-18 NOTE — Transfer of Care (Signed)
Immediate Anesthesia Transfer of Care Note  Patient: Samantha Kline  Procedure(s) Performed: Procedure(s): CLOSED VS. OPEN REDUCTION OF LEFT LONG FINGER (Left)  Patient Location: PACU  Anesthesia Type:General  Level of Consciousness: awake, alert , oriented and patient cooperative  Airway & Oxygen Therapy: Patient Spontanous Breathing  Post-op Assessment: Report given to RN and Post -op Vital signs reviewed and stable  Post vital signs: Reviewed and stable  Last Vitals:  Vitals:   12/18/15 0820 12/18/15 0830  BP: (!) 128/93 127/89  Pulse: (!) 135 96  Resp:    Temp:      Last Pain:  Vitals:   12/18/15 0845  TempSrc:   PainSc: 7       Patients Stated Pain Goal: 3 (12/07/15 1126)  Complications: No apparent anesthesia complications

## 2015-12-18 NOTE — Anesthesia Postprocedure Evaluation (Signed)
Anesthesia Post Note  Patient: Lorna FewHythia Minassian  Procedure(s) Performed: Procedure(s) (LRB): CLOSED VS. OPEN REDUCTION OF LEFT LONG FINGER (Left)  Patient location during evaluation: PACU Anesthesia Type: General Level of consciousness: awake and awake and alert Pain management: satisfactory to patient Vital Signs Assessment: post-procedure vital signs reviewed and stable Respiratory status: spontaneous breathing, nonlabored ventilation and respiratory function stable Cardiovascular status: blood pressure returned to baseline Anesthetic complications: no    Last Vitals:  Vitals:   12/18/15 1126 12/18/15 1147  BP:  (!) 138/92  Pulse:  (!) 110  Resp:  18  Temp: 36.6 C 36.6 C    Last Pain:  Vitals:   12/18/15 1147  TempSrc: Oral  PainSc:                  Joei Frangos COKER

## 2015-12-18 NOTE — Progress Notes (Addendum)
[facility]ICINE & REHABILITATION     PROGRESS NOTE  Subjective/Complaints:  Pt laying in bed this AM.  She had pain overnight after anesthesia wore off.  Pt not willing to have finger reduced by surgery at bedside due to pain.  Plan to go to OR today for reduction of dislocated finger.   ROS: Denies CP, SOB, N/V/D.  Objective: Vital Signs: Blood pressure 127/89, pulse 96, temperature 98.2 F (36.8 C), temperature source Oral, resp. rate 18, height 5\' 4"  (1.626 m), weight 46.3 kg (102 lb), SpO2 100 %. Dg Finger Middle Left  Result Date: 12/17/2015 CLINICAL DATA:  Left middle finger a after a fall today. Postreduction. EXAM: LEFT MIDDLE FINGER 2+V COMPARISON:  12/17/2015 FINDINGS: Persistent complete posterior dislocation of the middle phalanx of the left third finger with respect to the proximal phalanx. Overriding of the middle phalanx of about 6.3 mm. No acute fracture identified. IMPRESSION: Persistent complete dislocation of the proximal interphalangeal joint of the left third finger. Electronically Signed   By: Burman NievesWilliam  Stevens M.D.   On: 12/17/2015 23:03   Dg Finger Middle Left  Result Date: 12/17/2015 CLINICAL DATA:  Left middle finger swelling and pain 1 day after fall. Initial encounter. EXAM: LEFT MIDDLE FINGER 2+V COMPARISON:  None. FINDINGS: Three views study shows PIP joint dislocation with base of middle phalanx dislocated posteriorly. No associated fracture fragments are evident. IMPRESSION: PIP joint dislocation. Electronically Signed   By: Kennith CenterEric  Mansell M.D.   On: 12/17/2015 16:15   No results for input(s): WBC, HGB, HCT, PLT in the last 72 hours. No results for input(s): NA, K, CL, GLUCOSE, BUN, CREATININE, CALCIUM in the last 72 hours.  Invalid input(s): CO CBG (last 3)  No results for input(s): GLUCAP in the last 72 hours.  Wt Readings from Last 3 Encounters:  12/18/15 46.3 kg (102 lb)  11/23/15 60.6 kg (133 lb 11.2 oz)  10/31/15 58.6 kg (129 lb 3 oz)     Physical Exam:  BP 127/89 (BP Location: Right Arm)   Pulse 96   Temp 98.2 F (36.8 C) (Oral)   Resp 18   Ht 5\' 4"  (1.626 m)   Wt 46.3 kg (102 lb)   SpO2 100%   BMI 17.51 kg/m  Constitutional: Vital signs are normal. NAD. She appears cachectic. She has a sickly appearance.  HENT: Normocephalic and atraumatic.  Eyes: Conjunctivae and EOM are normal.  Cardiovascular: Regular rate and rhythm. No murmur heard. Respiratory: Effort normal and breath sounds normal. No stridor. No respiratory distress. She has no wheezes.  GI: Soft. Bowel sounds are normal. She exhibits no distension. There is no tenderness.  Musculoskeletal:  Edema and tenderness of 3rd digit LUE.   Muscle wasting all limbs.  Neurological:  A&O X3. Motor:  B/l UE: 4+/5 deltoid, biceps, triceps, wrist and HI (improving)  B/l LE: 4/5 HF, 4+/5 KE/KF and 5/5 ADF/PF (improving, Right stronger than left)  Skin: Skin is warm and dry. No rash noted. No erythema.  2 sacral decubitus ulcers with moisture associated ulcers healing (not examined today).  Psychiatric: Her affect is normal. Cognition and memory appear normal.   Assessment/Plan: 1. 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 Physiatrist is providing close team supervision and 24 hour management of active medical problems listed below. Physiatrist and rehab team continue to assess barriers to discharge/monitor patient progress toward functional and medical goals.  Function:  Bathing Bathing position   Position: Shower  Bathing parts Body parts bathed by patient: Right arm, Left arm, Chest, Abdomen, Front perineal area, Buttocks, Right upper leg, Left upper leg, Right lower leg, Left lower leg Body parts bathed by helper: Back  Bathing assist Assist Level: Supervision or verbal cues   Set up : To adjust water temperature, To open containers  Upper  Body Dressing/Undressing Upper body dressing   What is the patient wearing?: Pull over shirt/dress     Pull over shirt/dress - Perfomed by patient: Thread/unthread right sleeve, Thread/unthread left sleeve, Put head through opening, Pull shirt over trunk Pull over shirt/dress - Perfomed by helper: Put head through opening Button up shirt - Perfomed by patient: Thread/unthread left sleeve Button up shirt - Perfomed by helper: Thread/unthread right sleeve, Pull shirt around back, Button/unbutton shirt    Upper body assist Assist Level: Set up   Set up : To obtain clothing/put away  Lower Body Dressing/Undressing Lower body dressing   What is the patient wearing?: Pants, Non-skid slipper socks, Ted Hose, Underwear Underwear - Performed by patient: Thread/unthread right underwear leg, Thread/unthread left underwear leg, Pull underwear up/down Underwear - Performed by helper: Thread/unthread right underwear leg, Thread/unthread left underwear leg, Pull underwear up/down Pants- Performed by patient: Thread/unthread right pants leg, Thread/unthread left pants leg, Pull pants up/down, Fasten/unfasten pants Pants- Performed by helper: Thread/unthread left pants leg Non-skid slipper socks- Performed by patient: Don/doff left sock, Don/doff right sock Non-skid slipper socks- Performed by helper: Don/doff right sock, Don/doff left sock               TED Hose - Performed by helper: Don/doff left TED hose, Don/doff right TED hose  Lower body assist Assist for lower body dressing: Touching or steadying assistance (Pt > 75%)      Toileting Toileting Toileting activity did not occur: No continent bowel/bladder event Toileting steps completed by patient: Adjust clothing prior to toileting, Performs perineal hygiene, Adjust clothing after toileting Toileting steps completed by helper: Adjust clothing after toileting, Adjust clothing prior to toileting Toileting Assistive Devices: Grab bar or rail   Toileting assist Assist level: Supervision or verbal cues   Transfers Chair/bed transfer Chair/bed transfer activity did not occur: Safety/medical concerns Chair/bed transfer method: Stand pivot Chair/bed transfer assist level: Touching or steadying assistance (Pt > 75%) Chair/bed transfer assistive device: Armrests, Walker Mechanical lift: Landscape architect Ambulation activity did not occur: Safety/medical concerns   Max distance: 135 Assist level: Touching or steadying assistance (Pt > 75%)   Wheelchair   Type: Manual Max wheelchair distance: 227ft Assist Level: Supervision or verbal cues  Cognition Comprehension Comprehension assist level: Follows complex conversation/direction with extra time/assistive device  Expression Expression assist level: Expresses complex ideas: With extra time/assistive device  Social Interaction Social Interaction assist level: Interacts appropriately with others with medication or extra time (anti-anxiety, antidepressant).  Problem Solving Problem solving assist level: Solves basic 90% of the time/requires cueing < 10% of the time  Memory Memory assist level: Recognizes or recalls 75 - 89% of the time/requires cueing 10 - 24% of the time    Medical Problem List and Plan: 1.  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  DVT Prophylaxis/Anticoagulation: Mechanical: Sequential compression devices, below knee Bilateral lower extremities 3. Pain Management: Tylenol prn 4. Mood: LCSW to follow for evaluation and support.  5. Neuropsych: This patient is not fully capable of making decisions on her  own behalf. 6. Skin/Wound Care: Ordered air mattress overlay due to malnutrition, poor intake   2 sacral decubiti, pressure relief, keep dry, protein supplementation, foam dressing 7. Fluids/Electrolytes/Nutrition:  Continue dextrose for now.   Liquid diet advanced to soft diet at present, will cont  to advance as tolerated 8. HIV/AIDS with MAC?:  On descovy, ticovey  Biaxin and ethambutol d/ced per ID on 8/9.   Follow up per ID. Appreciate recs 9. Candida/CMV esophagitis:   Ganciclovir d/ced per ID on 8/24.   Fluconazole restarted on 8/24 per ID 10. FTT: Continue IVF for now.   Appetite improving  11. Potential for pancytopenia/fluctuation:  Check labs periodically given medications  GCSF to support marrow at least twice a week per ID 12. Hyponatremia/Hypokalemia: Monitor lytes.   Na+132 on 8/24: Will cont to monitor, treat if necessary  K+ 3.9 on 8/24.   Cont to monitor  Labs ordered for Monday 13. PCP prophylaxis: Bactrim discontinued due to hyponatremia. Has been treated for PCP infection and dapsone resumed 8/4  for prophylaxis.  14. Hypoalbuminemia  Supplement started 8/6 15. Leukocytosis: Resolved, afebrile- will contact ID if pt becomes febrile 16. Anemia of chronic disease  Hb 9.2 on 8/24  Labs ordered for Monday  Cont to monitor 17. Orthostasis: Improving  TEDS, abdominal binder  IVF qHS completed x3 days  Encouraged more fluids 18. Neutropenia  Will cont to monitor 19. Left 3rd digit PIP dorsal dislocation  After fall on 8/26  Confirmed by xray, attempted to reduce at bedside, unsuccessfully  Surgery consulted, pt in too much pain to allow bedside reduction, plan to go to OR today.  Appreciate recs.   Oxycodone as needed until relocation  LOS (Days) 23 A FACE TO FACE EVALUATION WAS PERFORMED  Talbert Trembath Karis Juba 12/18/2015 9:41 AM

## 2015-12-18 NOTE — Progress Notes (Signed)
Restless night. Complained of increased pain to left hand, middle finger, Dr. Allena KatzPatel paged and Oxy IR 5 mg given at 0506 with sip of water. ICE to left hand intermittently. Alfredo MartinezMurray, Keani Gotcher A

## 2015-12-18 NOTE — H&P (View-Only) (Signed)
Reason for Consult:left long pipj dislocation Referring Physician: Patel  Samantha Kline is an 54 y.o. female.  HPI: s/p fall this am with pain and deformity to left long finger with xray positive for pipj doslocation  Past Medical History:  Diagnosis Date  . Anemia   . HIV (human immunodeficiency virus infection) (HCC)    11/06/15 Family states it was just diagnosed this week  . Pneumonia 10/24/2015  . Vitamin D deficiency     Past Surgical History:  Procedure Laterality Date  . ESOPHAGOGASTRODUODENOSCOPY N/A 11/23/2015   Procedure: ESOPHAGOGASTRODUODENOSCOPY (EGD);  Surgeon: Marc Magod, MD;  Location: [facility];  Service: Endoscopy;  Laterality: N/A;  . TUBAL LIGATION  1980s    Family History  Problem Relation Age of Onset  . Diabetes Father   . Heart attack Brother   . Hypertension Sister     Social History:  reports that she has never smoked. She has never used smokeless tobacco. She reports that she drinks alcohol. She reports that she does not use drugs.  Allergies:  Allergies  Allergen Reactions  . Septra [Sulfamethoxazole-Trimethoprim]     Possible cause of hyponatremia    Medications:  Scheduled: . dapsone  100 mg Oral Daily  . dimethicone   Topical TID PC  . dolutegravir  50 mg Oral Q1200  . dronabinol  5 mg Oral BID AC  . emtricitabine-tenofovir AF  1 tablet Oral Q1200  . feeding supplement (ENSURE ENLIVE)  237 mL Oral BID BM  . feeding supplement (PRO-STAT SUGAR FREE 64)  30 mL Oral BID  . ferrous sulfate  325 mg Oral Q breakfast  . fluconazole  100 mg Oral Weekly  . Gerhardt's butt cream   Topical BID  . lidocaine-EPINEPHrine  20 mL Infiltration Once  . mouth rinse  15 mL Mouth Rinse BID  . multivitamin with minerals  1 tablet Oral Daily  . MUSCLE RUB   Topical BID  . potassium chloride  20 mEq Oral BID  . sodium bicarbonate  5 mL Intravenous Once  . sodium chloride 0.9 % 1,000 mL infusion   Intravenous Daily  . vitamin B-12  100 mcg Oral Daily  .  Vitamin D (Ergocalciferol)  50,000 Units Oral Q Fri    No results found for this or any previous visit (from the past 48 hour(s)).  Dg Finger Middle Left  Result Date: 12/17/2015 CLINICAL DATA:  Left middle finger a after a fall today. Postreduction. EXAM: LEFT MIDDLE FINGER 2+V COMPARISON:  12/17/2015 FINDINGS: Persistent complete posterior dislocation of the middle phalanx of the left third finger with respect to the proximal phalanx. Overriding of the middle phalanx of about 6.3 mm. No acute fracture identified. IMPRESSION: Persistent complete dislocation of the proximal interphalangeal joint of the left third finger. Electronically Signed   By: William  Stevens M.D.   On: 12/17/2015 23:03   Dg Finger Middle Left  Result Date: 12/17/2015 CLINICAL DATA:  Left middle finger swelling and pain 1 day after fall. Initial encounter. EXAM: LEFT MIDDLE FINGER 2+V COMPARISON:  None. FINDINGS: Three views study shows PIP joint dislocation with base of middle phalanx dislocated posteriorly. No associated fracture fragments are evident. IMPRESSION: PIP joint dislocation. Electronically Signed   By: Eric  Mansell M.D.   On: 12/17/2015 16:15    Review of Systems  All other systems reviewed and are negative.  Blood pressure 114/69, pulse (!) 122, temperature 98.4 F (36.9 C), temperature source Oral, resp. rate 18, height 5' 4" (1.626 m),   weight 46.3 kg (102 lb), SpO2 98 %. Physical Exam  Constitutional: She appears well-developed and well-nourished.  HENT:  Head: Normocephalic and atraumatic.  Neck: Normal range of motion.  Cardiovascular: Normal rate.   Respiratory: Effort normal.  Musculoskeletal:       Left hand: She exhibits tenderness, bony tenderness, deformity and swelling.  Left long pipj pain and swelling s/p fall  Neurological: She is alert.  Skin: Skin is warm.  Psychiatric: She has a normal mood and affect. Her behavior is normal. Judgment and thought content normal.     Assessment/Plan: As above  Digital block and then median nerve block per formed at bedside   Patient reports numbness but will not let reduction to take place due to pain  Will post for OR tomorrow for closed vs open reduction  Christne Platts A 12/17/2015, 11:21 PM     

## 2015-12-18 NOTE — Anesthesia Preprocedure Evaluation (Addendum)

## 2015-12-18 NOTE — Interval H&P Note (Signed)
History and Physical Interval Note:  12/18/2015 10:19 AM  Samantha Kline  has presented today for surgery, with the diagnosis of pipj dislocation  The various methods of treatment have been discussed with the patient and family. After consideration of risks, benefits and other options for treatment, the patient has consented to  Procedure(s): CLOSED VS. OPEN REDUCTION OF LEFT LONG FINGER (Left) as a surgical intervention .  The patient's history has been reviewed, patient examined, no change in status, stable for surgery.  I have reviewed the patient's chart and labs.  Questions were answered to the patient's satisfaction.     Dairl PonderWEINGOLD,Bricyn Labrada A

## 2015-12-18 NOTE — Progress Notes (Signed)
Patient returned to unit , report taken from OR RN. Vitals: 147/87; HR 105, Sp02 = 100% on RA; T 98.4; R 18. Spoke with surgeon, no additional orders given following procedure, following rehab orders. Will keep dressing on overnight to be removed by MD. Ortho wants follow up on Thursday 8/31. Patient resting comfortably, resuming diet. Will continue to monitor.

## 2015-12-18 NOTE — Progress Notes (Signed)
Physical Therapy Weekly Progress Note  Patient Details  Name: Samantha Kline MRN: [id number] Date of Birth: 09/13/1960  Beginning of progress report period: December 11, 2015 End of progress report period: December 18, 2015  Today's Date: 12/18/2015 PT Individual Time: 0801-0900 and 5009-3818 PT Individual Time Calculation (min): 59 min and 36 min Pt missed 9 minutes of scheduled PT time 2/2 nausea/vomiting.   Patient has met 4 of 4 short term goals.  Pt has made great progress towards all functional goals. Pt is able to ambulate >150 ft at times with RW & Min A but continues to require cuing for quality of gait. Pt progressed to stair training on 6" steps with B rail but would benefit from continued training to advance to stair negotiation with single rail. Pt & husband would benefit from hands on family training prior to d/c.   Patient continues to demonstrate the following deficits: decreased endurance & overall activity tolerance, decreased standing balance, decreased BLE strength, decreased safety awareness and therefore will continue to benefit from skilled PT intervention to enhance overall performance with activity tolerance, balance, ability to compensate for deficits, awareness and pt/family education.  Pt's goals were updated prior to this progress note. Pt now has ambulation goals with LRAD and supervision.   Patient progressing toward long term goals..  Continue plan of care.  PT Short Term Goals Week 3:  PT Short Term Goal 1 (Week 3): Pt will maintain standing balance with Mod A. PT Short Term Goal 1 - Progress (Week 3): Met PT Short Term Goal 2 (Week 3): Pt will demonstrate bed mobility (supine<>sit, rolling L<>R) consistently with supervision and hospital bed features.  PT Short Term Goal 2 - Progress (Week 3): Met PT Short Term Goal 3 (Week 3): Pt will ambulate 35 ft with LRAD & min A.  PT Short Term Goal 3 - Progress (Week 3): Met PT Short Term Goal 4 (Week 3): Pt will  negotiate 4 steps (6") with B rails & min A for strengthening. PT Short Term Goal 4 - Progress (Week 3): Met Week 4:  PT Short Term Goal 1 (Week 4): STG = LTG due to estimated d/c date.    Skilled Therapeutic Interventions/Progress Updates:    Treatment 1: Pt received in bed & agreeable to PT, noting 7/10 pain in L middle finger but reports being premedicated. Pt reported she was told she was unable to participate in therapy on this date as she is awaitng surgery. This therapist spoke with MD who verbally cleared pt to participate in PT to tolerance of L finger pain; therapist informed pt of this. Therapist donned pt's ted hose & abdominal binder total A for time management. Pt transferred supine>sitting EOB with supervision/mod I & use of bed rails. Vitals assessed with pt in sitting:  HR = up to 135 bpm SpO2 = 95% on room air BP = 128/93 mmHg  Pt with c/o feeling "hot" and returned to supine with cold washcloth applied to forehead. Vitals reassessed after ~3 minutes of rest and were: HR = 96 bpm, SpO2 = 100%, BP = 127/89 mmHg. RN notified of pt's elevated BP & HR with sitting EOB and RN informed this therapist pt unable to receive medication besides for pain 2/2 awaiting surgery.  Remainder of session focused on BLE strengthening supine exercises. Pt performed the following: hip adduction pillow squeezes, short arc quads with 5 second hold, heel slides, and hip abduction all 20 reps x 2 sets. At end of session  pt left in bed with all needs within reach & husband present.  Treatment 2: Pt received in handoff from OT & agreeable to tx; pt denied c/o pain. Pt noted nausea and RN administered nausea medication. Pt's vitals at rest: HR = 120 bpm, SpO2 = 99%, BP = 120/83 mmHg. Pt completed sit<>stand transfers with steady A throughout session. Gait training x 10 ft recliner>w/c with RW & steady A for balance. Transported pt room>gym via w/c total A for time management. Provided pt with seated rest break  until HR decreased to 105 bpm. Provided demonstration and verbal instruction for stair negotiation with single rail. Pt completed stair negotiation x 8 steps with R ascending rail, negotiating stairs laterally. Pt required steady A for task, but did require max A x 1 occasion 2/2 RLE weakness at top of stairs. Pt requires cuing to hold single rail instead of both, and to face rail as she would attempt to hold on to rail behind her. After task pt's HR (per dinamap) = 145 bpm & pt reported nausea. Began transporting pt back to room and she vomited en route; notified RN. In room pt ambulated 10 ft w/c>recliner in same manner as noted prior. Pt left in recliner with all needs within reach; HR at end of session = 120 bpm.   Therapy Documentation Precautions:  Precautions Precautions: Fall Restrictions Weight Bearing Restrictions: No  Vital Signs: Therapy Vitals Temp: 98.2 F (36.8 C) Temp Source: Oral Pulse Rate: (!) 115 Resp: 18 BP: 132/88 Patient Position (if appropriate): Lying Oxygen Therapy SpO2: 99 % O2 Device: Not Delivered  Pain: Pain Assessment Pain Assessment: 0-10 Pain Score: 7  Pain Location: Finger (Comment which one) (middle) Pain Orientation: Left Pain Intervention(s): Distraction (premedicated per pt)   See Function Navigator for Current Functional Status.  Therapy/Group: Individual Therapy  Waunita Schooner 12/18/2015, 12:22 PM

## 2015-12-19 ENCOUNTER — Inpatient Hospital Stay (HOSPITAL_COMMUNITY): Payer: Managed Care, Other (non HMO) | Admitting: Occupational Therapy

## 2015-12-19 ENCOUNTER — Inpatient Hospital Stay (HOSPITAL_COMMUNITY): Payer: Managed Care, Other (non HMO) | Admitting: Speech Pathology

## 2015-12-19 ENCOUNTER — Encounter (HOSPITAL_COMMUNITY): Payer: Self-pay | Admitting: Orthopedic Surgery

## 2015-12-19 ENCOUNTER — Inpatient Hospital Stay (HOSPITAL_COMMUNITY): Payer: Managed Care, Other (non HMO) | Admitting: Physical Therapy

## 2015-12-19 DIAGNOSIS — W19XXXS Unspecified fall, sequela: Secondary | ICD-10-CM

## 2015-12-19 LAB — COMPREHENSIVE METABOLIC PANEL
ALT: 24 U/L (ref 14–54)
AST: 29 U/L (ref 15–41)
Albumin: 1.8 g/dL — ABNORMAL LOW (ref 3.5–5.0)
Alkaline Phosphatase: 79 U/L (ref 38–126)
Anion gap: 6 (ref 5–15)
BUN: 10 mg/dL (ref 6–20)
CO2: 22 mmol/L (ref 22–32)
Calcium: 8.1 mg/dL — ABNORMAL LOW (ref 8.9–10.3)
Chloride: 105 mmol/L (ref 101–111)
Creatinine, Ser: 0.64 mg/dL (ref 0.44–1.00)
GFR calc Af Amer: >60 mL/min (ref >60)
GFR calc non Af Amer: >60 mL/min (ref >60)
Glucose, Bld: 86 mg/dL (ref 65–99)
Potassium: 3.9 mmol/L (ref 3.5–5.1)
Sodium: 133 mmol/L — ABNORMAL LOW (ref 135–145)
Total Bilirubin: 0.3 mg/dL (ref 0.3–1.2)
Total Protein: 6.3 g/dL — ABNORMAL LOW (ref 6.5–8.1)

## 2015-12-19 LAB — CBC WITH DIFFERENTIAL/PLATELET
Band Neutrophils: 0 %
Basophils Absolute: 0 10*3/uL (ref 0.0–0.1)
Basophils Relative: 0 %
Blasts: 0 %
Eosinophils Absolute: 0.1 10*3/uL (ref 0.0–0.7)
Eosinophils Relative: 3 %
HCT: 23 % — ABNORMAL LOW (ref 36.0–46.0)
Hemoglobin: 7.3 g/dL — ABNORMAL LOW (ref 12.0–15.0)
Lymphocytes Relative: 42 %
Lymphs Abs: 1.4 10*3/uL (ref 0.7–4.0)
MCH: 31.5 pg (ref 26.0–34.0)
MCHC: 31.7 g/dL (ref 30.0–36.0)
MCV: 99.1 fL (ref 78.0–100.0)
Metamyelocytes Relative: 0 %
Monocytes Absolute: 0.2 10*3/uL (ref 0.1–1.0)
Monocytes Relative: 5 %
Myelocytes: 0 %
Neutro Abs: 1.7 10*3/uL (ref 1.7–7.7)
Neutrophils Relative %: 50 %
Other: 0 %
Platelets: 416 10*3/uL — ABNORMAL HIGH (ref 150–400)
Promyelocytes Absolute: 0 %
RBC: 2.32 MIL/uL — ABNORMAL LOW (ref 3.87–5.11)
RDW: 18.4 % — ABNORMAL HIGH (ref 11.5–15.5)
WBC: 3.4 10*3/uL — ABNORMAL LOW (ref 4.0–10.5)
nRBC: 0 /100 WBC

## 2015-12-19 LAB — BASIC METABOLIC PANEL
Anion gap: 7 (ref 5–15)
BUN: 10 mg/dL (ref 6–20)
CO2: 23 mmol/L (ref 22–32)
Calcium: 8.6 mg/dL — ABNORMAL LOW (ref 8.9–10.3)
Chloride: 104 mmol/L (ref 101–111)
Creatinine, Ser: 0.68 mg/dL (ref 0.44–1.00)
GFR calc Af Amer: >60 mL/min (ref >60)
GFR calc non Af Amer: >60 mL/min (ref >60)
Glucose, Bld: 101 mg/dL — ABNORMAL HIGH (ref 65–99)
Potassium: 3.5 mmol/L (ref 3.5–5.1)
Sodium: 134 mmol/L — ABNORMAL LOW (ref 135–145)

## 2015-12-19 NOTE — Progress Notes (Signed)
Speech Language Pathology Daily Session Note  Patient Details  Name: Samantha Kline MRN: [id number] Date of Birth: April 19, 1961  Today's Date: 12/19/2015 SLP Individual Time: 1400-1500 SLP Individual Time Calculation (min): 60 min   Short Term Goals: Week 3: SLP Short Term Goal 1 (Week 3): Pt will utilize increased vocal intensity to achieve intelligibility at the sentence level with min assist verbal cues.  SLP Short Term Goal 2 (Week 3): Patient will problem solve use of call bell with supervision verbal cues  SLP Short Term Goal 3 (Week 3): Patient will utilize external aids to recall daily information with supervision cues.  SLP Short Term Goal 4 (Week 3): Patient will demonstrate sustained attention to familiar tasks for 5 minutes with supervision verbal cues for redirection  SLP Short Term Goal 5 (Week 3): Pt will consume soft diet with mod I use of standard swallowing precautions over three targeted sessions and with  minimal overt s/s of aspiration.    Skilled Therapeutic Interventions:Skilled therapy intervention focused on cognition. Patient performed functional menu task with Mod I for problem solving and by using a personal notebook as an external aid. Patient required Min A with problem solving in functional task of organizing her medications with a four time a day pillbox. Patient evidenced anticipatory awareness of functional skills by asking questions about how to obtain her medications after d/c. Patient left upright in chair with all needs within reach. Continue with current plan of care.   Function:    Cognition Comprehension Comprehension assist level: Follows complex conversation/direction with no assist  Expression   Expression assist level: Expresses complex ideas: With extra time/assistive device  Social Interaction Social Interaction assist level: Interacts appropriately with others - No medications needed.  Problem Solving Problem solving assist level: Solves complex  90% of the time/cues < 10% of the time  Memory Memory assist level: Assistive device: No helper    Pain Pain Assessment Pain Assessment: No/denies pain  Therapy/Group: Individual Therapy  Caryn Sectionlison Young 12/19/2015, 3:39 PM  The skilled treatment note has been reviewed and SLP is in agreement.  Feliberto Gottronourtney Akacia Boltz, M.A., CCC-SLP  [phone]

## 2015-12-19 NOTE — Progress Notes (Signed)
Slept good. Without complaint of pain. Dressing from OR still in place. Reports appetite better-ate 1/2 hamburger and 1/2 sandwich on this shift. Alfredo MartinezMurray, Jaiceon Collister A

## 2015-12-19 NOTE — Progress Notes (Signed)
[facility] PHYSICAL MEDICINE & REHABILITATION     PROGRESS NOTE  Subjective/Complaints:  Pt sitting working with OT.  She states she is doing well.  Her finger is splinted, she denies pain. Therapy notes nausea after meals, however, pt states this is improving.  She has questions about her viral load.   ROS: Denies CP, SOB, N/V/D.  Objective: Vital Signs: Blood pressure 115/75, pulse 88, temperature 98.1 F (36.7 C), temperature source Oral, resp. rate 17, height 5\' 4"  (1.626 m), weight 53.4 kg (117 lb 11.2 oz), SpO2 100 %. Dg Finger Middle Left  Result Date: 12/17/2015 CLINICAL DATA:  Left middle finger a after a fall today. Postreduction. EXAM: LEFT MIDDLE FINGER 2+V COMPARISON:  12/17/2015 FINDINGS: Persistent complete posterior dislocation of the middle phalanx of the left third finger with respect to the proximal phalanx. Overriding of the middle phalanx of about 6.3 mm. No acute fracture identified. IMPRESSION: Persistent complete dislocation of the proximal interphalangeal joint of the left third finger. Electronically Signed   By: Burman Nieves M.D.   On: 12/17/2015 23:03   Dg Finger Middle Left  Result Date: 12/17/2015 CLINICAL DATA:  Left middle finger swelling and pain 1 day after fall. Initial encounter. EXAM: LEFT MIDDLE FINGER 2+V COMPARISON:  None. FINDINGS: Three views study shows PIP joint dislocation with base of middle phalanx dislocated posteriorly. No associated fracture fragments are evident. IMPRESSION: PIP joint dislocation. Electronically Signed   By: Kennith Center M.D.   On: 12/17/2015 16:15    Recent Labs  12/19/15 0536  WBC 3.4*  HGB 7.3*  HCT 23.0*  PLT 416*    Recent Labs  12/19/15 0536  NA 133*  K 3.9  CL 105  GLUCOSE 86  BUN 10  CREATININE 0.64  CALCIUM 8.1*   CBG (last 3)  No results for input(s): GLUCAP in the last 72 hours.  Wt Readings from Last 3 Encounters:  12/19/15 53.4 kg (117 lb 11.2 oz)  11/23/15 60.6 kg (133 lb 11.2 oz)   10/31/15 58.6 kg (129 lb 3 oz)    Physical Exam:  BP 115/75 (BP Location: Right Arm)   Pulse 88   Temp 98.1 F (36.7 C) (Oral)   Resp 17   Ht 5\' 4"  (1.626 m)   Wt 53.4 kg (117 lb 11.2 oz)   SpO2 100%   BMI 20.20 kg/m  Constitutional: Vital signs are normal. NAD. She appears cachectic.   HENT: Normocephalic and atraumatic.  Eyes: Conjunctivae and EOM are normal.  Cardiovascular: Regular rate and rhythm. No murmur heard. Respiratory: Effort normal and breath sounds normal. No stridor. No respiratory distress. She has no wheezes.  GI: Soft. Bowel sounds are normal. She exhibits no distension. There is no tenderness.  Musculoskeletal:  Edema and tenderness of 3rd digit LUE, splinted.   Muscle wasting all limbs.  Neurological:  A&O X3. Motor:  B/l UE: 4+/5 deltoid, biceps, triceps, wrist and HI (improving)  B/l LE: 4/5 HF, 4+/5 KE/KF and 5/5 ADF/PF (improving)  Skin: Skin is warm and dry. No rash noted. No erythema.  2 sacral decubitus ulcers with moisture associated ulcers healing (not examined today).  Psychiatric: Her affect is normal. Cognition and memory appear normal.   Assessment/Plan: 1. 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 Physiatrist is providing close team supervision and 24 hour management of active medical problems listed below. Physiatrist and rehab team continue to assess barriers  to discharge/monitor patient progress toward functional and medical goals.  Function:  Bathing Bathing position   Position: Shower  Bathing parts Body parts bathed by patient: Right arm, Left arm, Chest, Abdomen, Front perineal area, Buttocks, Right upper leg, Left upper leg, Right lower leg, Left lower leg Body parts bathed by helper: Back  Bathing assist Assist Level: Supervision or verbal cues   Set up : To adjust water temperature, To open containers  Upper Body  Dressing/Undressing Upper body dressing   What is the patient wearing?: Pull over shirt/dress     Pull over shirt/dress - Perfomed by patient: Thread/unthread right sleeve, Thread/unthread left sleeve, Put head through opening, Pull shirt over trunk Pull over shirt/dress - Perfomed by helper: Put head through opening Button up shirt - Perfomed by patient: Thread/unthread left sleeve Button up shirt - Perfomed by helper: Thread/unthread right sleeve, Pull shirt around back, Button/unbutton shirt    Upper body assist Assist Level: Set up   Set up : To obtain clothing/put away  Lower Body Dressing/Undressing Lower body dressing   What is the patient wearing?: Pants, Non-skid slipper socks, Ted Hose, Underwear Underwear - Performed by patient: Thread/unthread right underwear leg, Thread/unthread left underwear leg, Pull underwear up/down Underwear - Performed by helper: Thread/unthread right underwear leg, Thread/unthread left underwear leg, Pull underwear up/down Pants- Performed by patient: Thread/unthread right pants leg, Thread/unthread left pants leg, Pull pants up/down, Fasten/unfasten pants Pants- Performed by helper: Thread/unthread left pants leg Non-skid slipper socks- Performed by patient: Don/doff left sock, Don/doff right sock Non-skid slipper socks- Performed by helper: Don/doff right sock, Don/doff left sock               TED Hose - Performed by helper: Don/doff left TED hose, Don/doff right TED hose  Lower body assist Assist for lower body dressing: Touching or steadying assistance (Pt > 75%)      Toileting Toileting Toileting activity did not occur: No continent bowel/bladder event Toileting steps completed by patient: Adjust clothing prior to toileting, Performs perineal hygiene, Adjust clothing after toileting Toileting steps completed by helper: Adjust clothing after toileting, Adjust clothing prior to toileting Toileting Assistive Devices: Grab bar or rail   Toileting assist Assist level: Supervision or verbal cues   Transfers Chair/bed transfer Chair/bed transfer activity did not occur: Safety/medical concerns Chair/bed transfer method: Ambulatory Chair/bed transfer assist level: Touching or steadying assistance (Pt > 75%) Chair/bed transfer assistive device: Biochemist, clinical lift: Landscape architect Ambulation activity did not occur: Safety/medical concerns   Max distance: 200 ft Assist level: Touching or steadying assistance (Pt > 75%)   Wheelchair   Type: Manual Max wheelchair distance: 264ft Assist Level: Supervision or verbal cues  Cognition Comprehension Comprehension assist level: Follows complex conversation/direction with extra time/assistive device  Expression Expression assist level: Expresses complex ideas: With extra time/assistive device  Social Interaction Social Interaction assist level: Interacts appropriately with others with medication or extra time (anti-anxiety, antidepressant).  Problem Solving Problem solving assist level: Solves basic 90% of the time/requires cueing < 10% of the time  Memory Memory assist level: Recognizes or recalls 75 - 89% of the time/requires cueing 10 - 24% of the time    Medical Problem List and Plan: 1.  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  DVT Prophylaxis/Anticoagulation: Mechanical: Sequential compression devices, below knee Bilateral lower extremities 3. Pain Management: Tylenol prn 4. Mood: LCSW  to follow for evaluation and support.  5. Neuropsych: This patient is not fully capable of making decisions on her own behalf. 6. Skin/Wound Care: Ordered air mattress overlay due to malnutrition, poor intake   2 sacral decubiti, pressure relief, keep dry, protein supplementation, foam dressing 7. Fluids/Electrolytes/Nutrition:  Continue dextrose for now.    Liquid diet advanced to soft diet at present, will cont to advance as tolerated 8. HIV/AIDS with MAC?:  On descovy, ticovey  Biaxin and ethambutol d/ced per ID on 8/9.   Follow up per ID. Appreciate recs 9. Candida/CMV esophagitis:   Ganciclovir d/ced per ID on 8/24.   Fluconazole restarted on 8/24 per ID 10. FTT:   Appetite improving  11. Potential for pancytopenia/fluctuation:  Check labs periodically given medications  GCSF to support marrow at least twice a week per ID 12. Hyponatremia/Hypokalemia: Monitor lytes.   Na+133 on 8/28 (stable): Will cont to monitor, treat if necessary  K+ 3.9 on 8/28.   Cont to monitor 13. PCP prophylaxis: Bactrim discontinued due to hyponatremia. Has been treated for PCP infection and dapsone resumed 8/4  for prophylaxis.  14. Hypoalbuminemia  Supplement started 8/6 15. Leukocytosis: Resolved, afebrile- will contact ID if pt becomes febrile 16. 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 Orthostasis: Improving  TEDS, abdominal binder  IVF qHS completed x3 days  Encouraged more fluids 18. Neutropenia  Will cont to monitor 19. Left 3rd digit PIP dorsal dislocation  After fall on 8/26  Confirmed by xray, attempted to reduce at bedside, unsuccessfully  OR 8/28, for which appears to be closed reduction.  Appreciate recs.   No further recs at present  LOS (Days) 24 A FACE TO FACE EVALUATION WAS PERFORMED  Tamkia Temples Karis Jubanil Crislyn Willbanks 12/19/2015 10:48 AM

## 2015-12-19 NOTE — Progress Notes (Signed)
Physical Therapy Session Note  Patient Details  Name: Samantha Kline MRN: [id number] Date of Birth: Aug 28, 1960  Today's Date: 12/19/2015 PT Individual Time: 0805-0900 PT Individual Time Calculation (min): 55 min    Short Term Goals: Week 4:  PT Short Term Goal 1 (Week 4): STG = LTG due to estimated d/c date.   Skilled Therapeutic Interventions/Progress Updates:    Pt received in recliner & agreeable to PT, denying c/o pain. Vitals at rest: HR = 88 bpm, SpO2 = 100%, BP = 115/75 mmHg. Session focused on gait training, bed mobility and stair training. During session pt ambulated 200 ft + 85 ft + 170 ft with RW & steady A fading to close supervision. Pt demonstrates decreasing scissoring gait. Pt performed bed mobility (rolling L<>R, supine<>sit) on apartment bed with supervision. Throughout session pt required cuing for proper hand placement for transfers from recliner, low, soft couch, and w/c. Stair training completed x 12 steps (6") with R ascending rail and steady A fading to close supervision. Pt negotiated stairs laterally requiring less cuing on this date for proper technique; after task HR = 114 bpm. At end of session pt left sitting in recliner in room with all needs within reach & BLE elevated.   Therapy Documentation Precautions:  Precautions Precautions: Fall Restrictions Weight Bearing Restrictions: No  Vital Signs: Therapy Vitals Pulse Rate: 88 BP: 115/75 Patient Position (if appropriate): Sitting Oxygen Therapy SpO2: 100 % O2 Device: Not Delivered   See Function Navigator for Current Functional Status.   Therapy/Group: Individual Therapy  Sandi MariscalVictoria M Miller 12/19/2015, 10:29 AM

## 2015-12-19 NOTE — Progress Notes (Signed)
Occupational Therapy Session Note  Patient Details  Name: Samantha Kline MRN: [id number] Date of Birth: 21-May-1960  Today's Date: 12/19/2015 OT Individual Time: 1001-1059 OT Individual Time Calculation (min): 58 min   Short Term Goals: Week 4:  OT Short Term Goal 1 (Week 4): Pt will tolerate standing at the sink for grooming task for 8 mins without rest break  OT Short Term Goal 2 (Week 4): Pt will ambulate into the bathroom and transfer on/off toilet with Mod A OT Short Term Goal 3 (Week 4): Pt will don/doff TED hose with Min A OT Short Term Goal 4 (Week 4): Pt will maintain dynamic standing balance while reaching outside base of support with Min A  Skilled Therapeutic Interventions/Progress Updates:   Pt seen for skilled OT to address ADL participation, functional mobility, functional transfers, general strengthening and activity tolerance. Pt greeted in recliner chair and eager to participate. OT removed splint on L long finger prior to shower-pt tolerated some flexion of long finger with minimal pain. Pt stood from recliner and ambulated into the bathroom to transfer onto tub bench with supervision. Pt removed clothing with supervision, and mIn A to doff TED hose. Pt then completed bathing tasks with set-up and supervision for standing balance when washing buttocks and peri-area. Pt completed dressing seated in w/c at the sink with supervision and Min A to don TED hose. OT re-wrapped R long finger splint and pt left seated in recliner with needs met.   Therapy Documentation Precautions:  Precautions Precautions: Fall Restrictions Weight Bearing Restrictions: No Pain: Pain Assessment Pain Assessment: No/denies pain  Therapy/Group: Individual Therapy  Valma Cava 12/19/2015, 12:48 PM

## 2015-12-19 NOTE — Progress Notes (Signed)
Speech Language Pathology Daily Session Note  Patient Details  Name: Samantha Kline MRN: [id number] Date of Birth: 04-03-1961  Today's Date: 12/19/2015 SLP Individual Time: 1130-1158 SLP Individual Time Calculation (min): 28 min   Short Term Goals: Week 3: SLP Short Term Goal 1 (Week 3): Pt will utilize increased vocal intensity to achieve intelligibility at the sentence level with min assist verbal cues.  SLP Short Term Goal 2 (Week 3): Patient will problem solve use of call bell with supervision verbal cues  SLP Short Term Goal 3 (Week 3): Patient will utilize external aids to recall daily information with supervision cues.  SLP Short Term Goal 4 (Week 3): Patient will demonstrate sustained attention to familiar tasks for 5 minutes with supervision verbal cues for redirection  SLP Short Term Goal 5 (Week 3): Pt will consume soft diet with mod I use of standard swallowing precautions over three targeted sessions and with  minimal overt s/s of aspiration.    Skilled Therapeutic Interventions: Skilled treatment session focused on addressing cognition goals and education regarding current diet restrictions.  Husband present for session today. Patient alert and agreeable to participate in session.  Patient reports that last week she utilized a Glass blower/designermemory notebook to assist with recall of daily events; however, she mis placed it.  SLP located notebook and provided Supervision level question cues to recall daily events, patient then Mod I for recording notes.  SLP also facilitated session with verbal education as well as a hand out that reviewed restrictions of a soft solid diet.  Husband and patient both made aware and asked a few appropriate questions.  Continue with current plan of care.   Function:  Eating Eating   Modified Consistency Diet: Yes Eating Assist Level: More than reasonable amount of time   Eating Set Up Assist For: Opening containers       Cognition Comprehension Comprehension  assist level: Follows complex conversation/direction with extra time/assistive device  Expression   Expression assist level: Expresses complex ideas: With extra time/assistive device  Social Interaction Social Interaction assist level: Interacts appropriately with others - No medications needed.  Problem Solving Problem solving assist level: Solves basic 90% of the time/requires cueing < 10% of the time  Memory Memory assist level: Assistive device: No helper    Pain Pain Assessment Pain Assessment: No/denies pain  Therapy/Group: Individual Therapy  Charlane FerrettiMelissa Ahmir Bracken, M.A., CCC-SLP [phone]  Samantha Kline 12/19/2015, 4:23 PM

## 2015-12-20 ENCOUNTER — Inpatient Hospital Stay (HOSPITAL_COMMUNITY): Payer: Managed Care, Other (non HMO) | Admitting: Occupational Therapy

## 2015-12-20 ENCOUNTER — Inpatient Hospital Stay (HOSPITAL_COMMUNITY): Payer: Managed Care, Other (non HMO) | Admitting: Physical Therapy

## 2015-12-20 ENCOUNTER — Inpatient Hospital Stay (HOSPITAL_COMMUNITY): Payer: Managed Care, Other (non HMO) | Admitting: Speech Pathology

## 2015-12-20 NOTE — Progress Notes (Signed)
Nutrition Follow-up  DOCUMENTATION CODES:   Non-severe (moderate) malnutrition in context of acute illness/injury  INTERVENTION:   -Continue Ensure Enlive po BID, each supplement provides 350 kcal and 20 grams of protein.  -Continue 30 ml Prostat po BID, each supplement provides 100 kcal and 15 grams of protein.   Encourage adequate PO intake.   NUTRITION DIAGNOSIS:   Inadequate oral intake related to lethargy/confusion, poor appetite as evidenced by meal completion < 25%.  Progressing  GOAL:   Patient will meet greater than or equal to 90% of their needs  Progressing  MONITOR:   PO intake, Supplement acceptance, Diet advancement, Labs, Weight trends, Skin, I & O's  REASON FOR ASSESSMENT:   Consult Calorie Count  ASSESSMENT:   55 y/o female who had originally presented on 7/3 with 50 lb weight loss over few months, anorexia, SOB, progressive weakness. Worked up for PCP PNA and diagnosed with AIDS. Went home on antiviral therapy but continued with n/v, poor intake and inability to walk. Readmitted on 7/17 with lethargy, confusion and weakness and severe hyponatremia (110). Developed candida esophagitis. Progressed, but intake has remained poor with refusal of PEG/NGT. Transferred to inpatient rehab on 8/4. RD consulted for calorie count/poor PO intake  Pt working with physical therapy at time of visit. Pt was sitting in recliner chair, completing crossword puzzles.   Per chart review, intake continues to improve. Meal completion 50-100%. Per MAR, pt is accepting Ensure Enlive and Prostat well. RD will continue supplements due to hx of malnutrition.   Labs reviewed: Na: 134 (on IV supplementation).   Diet Order:  DIET SOFT Room service appropriate? Yes; Fluid consistency: Thin  Skin:  Wound (see comment) (MASD on peri-rectal area)  Last BM:  12/19/15  Height:   Ht Readings from Last 1 Encounters:  11/25/15 5\' 4"  (1.626 m)    Weight:   Wt Readings from Last 1  Encounters:  12/19/15 117 lb 11.2 oz (53.4 kg)    Ideal Body Weight:  54.54 kg  BMI:  Body mass index is 20.2 kg/m.  Estimated Nutritional Needs:   Kcal:  1700-1900 (28-31 kcal/kg bw)  Protein:  84-96 g (1.4-1.6 g/kg bw)  Fluid:  >1.8 liters (30 ml/kg bw)  EDUCATION NEEDS:   No education needs identified at this time  Azilee Pirro A. Mayford KnifeWilliams, RD, LDN, CDE Pager: [phone] After hours Pager: [phone]

## 2015-12-20 NOTE — Progress Notes (Signed)
Social Work Patient ID: Samantha Kline, female   DOB: 23-Feb-1961, 55 y.o.   MRN: [id number]  Husband is here for family education today and will have other caregiver be in later this week. Pt is doing well today and continues to make Progress in therapies. Discussed equipment needs-ie transport chair and tub bench. [employer] will probably not cover the tub bench and husband and pt to decide if want to get one. Work on home health and DME for Friday.

## 2015-12-20 NOTE — Progress Notes (Addendum)
Physical Therapy Session Note  Patient Details  Name: Samantha Kline MRN: [id number] Date of Birth: [birth year]  Today's Date: 12/20/2015 PT Individual Time: 1032-1100 and 1419-1500 PT Individual Time Calculation (min): 28 min and 41 min   Short Term Goals: Week 4:  PT Short Term Goal 1 (Week 4): STG = LTG due to estimated d/c date.   Skilled Therapeutic Interventions/Progress Updates:    Treatment 1: Pt received in recliner, denying c/o pain & agreeable to tx. Pt's husband present for family education. Discussed home set up & entry; educated pt & husband this therapist recommends pt enter home via 6 steps with rail instead of via 2 steps (one made of cinderblock) without rails but husband & pt state "this is not a big deal" and pt can enter home at this entrance. Educated pt to sit up during the day (versus lying in bed) as much as possible, need for 24 hr supervision especially when pt performing functional mobility tasks, and need for pt to ambulate when she feels able and to take rest breaks when needed. Pt reports she plans to ascend flights of stairs to access 3rd floor where her bedroom & bathroom are. Discussed DME needs and pt will need transport chair for community access following d/c from CIR. Transfer (sit<>stand) and gait training completed with pt & husband; pt with required close supervision for all tasks and pt ambulated 200 ft with RW. Educated pt/husband to remove all throw rugs & objects in walking paths to decrease pt's risk for falls. At end of session pt left sitting in recliner with all needs within reach & husband present to supervise.   Treatment 2: Pt received asleep in recliner but easily awakened & agreeable to PT; pt denied c/o pain. Discussed home set up with pt & husband who still wish for pt to be able to enter home at both entrances (6 steps with rail and 2 steps without rails). Pt ambulated 150 ft + 150 ft + 250 ft on unit with RW & husband providing supervision; pt  required occasional cuing for increased step width as she demonstrated scissoring gait. Pt and husband report they have 6-10 steps to access each floor of home without railings but husband states he can install R ascending rail by pt's d/c date. Pt negotiated 8 steps + 8 steps (6") laterally with R ascending rail and supervision from husband. Therapist set up simulated home entry with 2 steps without rails (1 step is a cinder block pt's husband reports is stable; therapist has discouraged pt & husband from using this entrance 2/2 safety concerns) and provided demonstration on safe stair negotiation. Pt negotiated simulated home entry x 2 trials (once with husband) with supervision, RW, and minimal cuing for safety. Pt performed car transfer with RW & supervision, with seat height simulating pt's vehicle. At end of session pt returned to recliner in room and requested a snack. RN cleared pt to have saltine crackers & peanut butter; when therapist returned to room with snack pt reported she had vomited. RN notified & pt left in recliner with all needs within reach & husband present.   Therapy Documentation Precautions:  Precautions Precautions: Fall Restrictions Weight Bearing Restrictions: No   See Function Navigator for Current Functional Status.   Therapy/Group: Individual Therapy  Sandi MariscalVictoria M Edmar Blankenburg 12/20/2015, 12:20 PM

## 2015-12-20 NOTE — Progress Notes (Signed)
[place] PHYSICAL MEDICINE & REHABILITATION     PROGRESS NOTE  Subjective/Complaints:  Pt seen ambulating from restroom, then sitting EOB. She states she is doing much better and isn't bothered by her finger.   ROS: Denies CP, SOB, N/V/D.  Objective: Vital Signs: Blood pressure 113/63, pulse 81, temperature 97.7 F (36.5 C), temperature source Oral, resp. rate 18, height 5\' 4"  (1.626 m), weight 53.4 kg (117 lb 11.2 oz), SpO2 100 %. No results found.  Recent Labs  12/19/15 0536  WBC 3.4*  HGB 7.3*  HCT 23.0*  PLT 416*    Recent Labs  12/19/15 0536 12/19/15 1105  NA 133* 134*  K 3.9 3.5  CL 105 104  GLUCOSE 86 101*  BUN 10 10  CREATININE 0.64 0.68  CALCIUM 8.1* 8.6*   CBG (last 3)  No results for input(s): GLUCAP in the last 72 hours.  Wt Readings from Last 3 Encounters:  12/19/15 53.4 kg (117 lb 11.2 oz)  11/23/15 60.6 kg (133 lb 11.2 oz)  10/31/15 58.6 kg (129 lb 3 oz)    Physical Exam:  BP 113/63 (BP Location: Right Arm)   Pulse 81   Temp 97.7 F (36.5 C) (Oral)   Resp 18   Ht 5\' 4"  (1.626 m)   Wt 53.4 kg (117 lb 11.2 oz)   SpO2 100%   BMI 20.20 kg/m  Constitutional: Vital signs are normal. NAD. She appears cachectic.   HENT: Normocephalic and atraumatic.  Eyes: Conjunctivae and EOM are normal.  Cardiovascular: Regular rate and rhythm. No murmur heard. Respiratory: Effort normal and breath sounds normal. No stridor. No respiratory distress. She has no wheezes.  GI: Soft. Bowel sounds are normal. She exhibits no distension. There is no tenderness.  Musculoskeletal:  Edema and tenderness of 3rd digit LUE, splinted.   Muscle wasting all limbs.  Neurological:  A&O X3. Motor:  B/l UE: 4+/5 deltoid, biceps, triceps, wrist and HI (improving)  B/l LE: 4/5 HF, 4+/5 KE/KF and 5/5 ADF/PF (improving)  Skin: Skin is warm and dry. No rash noted. No erythema.  2 sacral decubitus ulcers with moisture associated ulcers healing (not examined today).   Psychiatric: Her affect is normal. Cognition and memory appear normal.   Assessment/Plan: 1. 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 Physiatrist is providing close team supervision and 24 hour management of active medical problems listed below. Physiatrist and rehab team continue to assess barriers to discharge/monitor patient progress toward functional and medical goals.  Function:  Bathing Bathing position   Position: Shower  Bathing parts Body parts bathed by patient: Right arm, Left arm, Chest, Abdomen, Front perineal area, Buttocks, Right upper leg, Left upper leg, Right lower leg, Left lower leg Body parts bathed by helper: Back  Bathing assist Assist Level: Supervision or verbal cues   Set up : To adjust water temperature, To open containers  Upper Body Dressing/Undressing Upper body dressing   What is the patient wearing?: Hospital gown     Pull over shirt/dress - Perfomed by patient: Thread/unthread right sleeve, Thread/unthread left sleeve, Put head through opening, Pull shirt over trunk Pull over shirt/dress - Perfomed by helper: Put head through opening Button up shirt - Perfomed by patient: Thread/unthread left sleeve Button up shirt - Perfomed by helper: Thread/unthread right sleeve, Pull shirt around back, Button/unbutton shirt    Upper body assist Assist Level: Set up   Set up :  To obtain clothing/put away  Lower Body Dressing/Undressing Lower body dressing   What is the patient wearing?: Socks, [employer] Hose Underwear - Performed by patient: Thread/unthread right underwear leg, Thread/unthread left underwear leg, Pull underwear up/down Underwear - Performed by helper: Thread/unthread right underwear leg, Thread/unthread left underwear leg, Pull underwear up/down Pants- Performed by patient: Thread/unthread right pants leg, Thread/unthread left pants leg,  Pull pants up/down, Fasten/unfasten pants Pants- Performed by helper: Thread/unthread left pants leg Non-skid slipper socks- Performed by patient: Don/doff left sock, Don/doff right sock Non-skid slipper socks- Performed by helper: Don/doff right sock, Don/doff left sock               TED Hose - Performed by helper: Don/doff left TED hose, Don/doff right TED hose  Lower body assist Assist for lower body dressing: Touching or steadying assistance (Pt > 75%)      Toileting Toileting Toileting activity did not occur: No continent bowel/bladder event Toileting steps completed by patient: Adjust clothing prior to toileting, Performs perineal hygiene, Adjust clothing after toileting Toileting steps completed by helper: Adjust clothing after toileting, Adjust clothing prior to toileting Toileting Assistive Devices: Grab bar or rail  Toileting assist Assist level: Supervision or verbal cues   Transfers Chair/bed transfer Chair/bed transfer activity did not occur: Safety/medical concerns Chair/bed transfer method: Ambulatory Chair/bed transfer assist level: Touching or steadying assistance (Pt > 75%) Chair/bed transfer assistive device: Biochemist, clinicalWalker Mechanical lift: Landscape architecttedy   Locomotion Ambulation Ambulation activity did not occur: Safety/medical concerns   Max distance: 200 ft Assist level: Touching or steadying assistance (Pt > 75%)   Wheelchair   Type: Manual Max wheelchair distance: 27500ft Assist Level: Supervision or verbal cues  Cognition Comprehension Comprehension assist level: Follows complex conversation/direction with extra time/assistive device  Expression Expression assist level: Expresses complex ideas: With extra time/assistive device  Social Interaction Social Interaction assist level: Interacts appropriately with others - No medications needed.  Problem Solving Problem solving assist level: Solves basic 90% of the time/requires cueing < 10% of the time  Memory Memory assist  level: Assistive device: No helper    Medical Problem List and Plan: 1.  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  DVT Prophylaxis/Anticoagulation: Mechanical: Sequential compression devices, below knee Bilateral lower extremities 3. Pain Management: Tylenol prn 4. Mood: LCSW to follow for evaluation and support.  5. Neuropsych: This patient is not fully capable of making decisions on her own behalf. 6. Skin/Wound Care: Ordered air mattress overlay due to malnutrition, poor intake   2 sacral decubiti, pressure relief, keep dry, protein supplementation, foam dressing 7. Fluids/Electrolytes/Nutrition:  Continue dextrose for now.   Liquid diet advanced to soft diet at present, will cont to advance as tolerated 8. HIV/AIDS with MAC?:  On descovy, ticovey  Biaxin and ethambutol d/ced per ID on 8/9.   Follow up per ID. Appreciate recs 9. Candida/CMV esophagitis:   Ganciclovir d/ced per ID on 8/24.   Fluconazole restarted on 8/24 per ID 10. FTT:   Appetite improving  11. Potential for pancytopenia/fluctuation:  Check labs periodically given medications  GCSF to support marrow at least twice a week per ID 12. Hyponatremia/Hypokalemia: Monitor lytes.   Na+134 on 8/28 (stable): Will cont to monitor, treat if necessary  K+ 3.5 on 8/28.   Cont to monitor 13. PCP prophylaxis: Bactrim discontinued due to hyponatremia. Has been treated for PCP infection and dapsone resumed 8/4  for prophylaxis.  14. Hypoalbuminemia  Supplement started 8/6 15. Leukocytosis: Resolved, afebrile- will contact ID if pt becomes febrile 16. 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 Orthostasis: Improving  TEDS, abdominal binder  IVF qHS completed x3 days  Encouraged more fluids 18. Neutropenia  Will cont to monitor 19. Left 3rd digit PIP dorsal  dislocation  After fall on 8/26  Confirmed by xray, attempted to reduce at bedside, unsuccessfully  OR 8/28, for which appears to be closed reduction.  Appreciate recs.   No further recs at present  LOS (Days) 25 A FACE TO FACE EVALUATION WAS PERFORMED  Vanity Larsson Karis Juba 12/20/2015 10:31 AM

## 2015-12-20 NOTE — Progress Notes (Signed)
Speech Language Pathology Weekly Progress and Session Note  Patient Details  Name: Samantha Kline MRN: [id number] Date of Birth: 1960/05/03  Beginning of progress report period: December 12, 2015 End of progress report period: December 20, 2015  Today's Date: 12/20/2015 SLP Individual Time: 1100-1200 SLP Individual Time Calculation (min): 60 min   Short Term Goals: Week 3: SLP Short Term Goal 1 (Week 3): Pt will utilize increased vocal intensity to achieve intelligibility at the sentence level with min assist verbal cues.  SLP Short Term Goal 1 - Progress (Week 3): Met SLP Short Term Goal 2 (Week 3): Patient will problem solve use of call bell with supervision verbal cues  SLP Short Term Goal 2 - Progress (Week 3): Met SLP Short Term Goal 3 (Week 3): Patient will utilize external aids to recall daily information with supervision cues.  SLP Short Term Goal 3 - Progress (Week 3): Met SLP Short Term Goal 4 (Week 3): Patient will demonstrate sustained attention to familiar tasks for 5 minutes with supervision verbal cues for redirection  SLP Short Term Goal 4 - Progress (Week 3): Met SLP Short Term Goal 5 (Week 3): Pt will consume soft diet with mod I use of standard swallowing precautions over three targeted sessions and with  minimal overt s/s of aspiration.   SLP Short Term Goal 5 - Progress (Week 3): Met    New Short Term Goals: Week 4: SLP Short Term Goal 1 (Week 4): short term goals = long term goals   Weekly Progress Updates:   Patient has made functional gains and has met 5 out of 5 short term goals this reporting period due to improved cognitive-linguistic abilities as well as swallow function. Currently, patient continues to require Min-Supervision assist and has the potential to reach Mod I with some of her long term goals.  Patient is consuming a soft diet Mod I as well.  Patient and family education is ongoing at this time. Patient would benefit from continued skilled SLP  intervention to maximize functional independence and to maximize their functional independence prior to discharge with 24 hour supervision.   Intensity: Minumum of 1-2 x/day, 30 to 90 minutes Frequency: 3 to 5 out of 7 days Duration/Length of Stay: 9/2 Treatment/Interventions: Cognitive remediation/compensation;Cueing hierarchy;Functional tasks;Environmental controls;Dysphagia/aspiration precaution training;Internal/external aids;Speech/Language facilitation;Therapeutic Activities   Daily Session  Skilled Therapeutic Interventions:  Skilled treatment session focused on addressing cognition goals and patient/fmaily education.  SLP facilitated session by providing education regarding memory compensatory strategies.  Patient required Min question cues to anticipate how to implement recommendations at home and provide an example of each.  Patient demonstrated sustained attention for 10 minutes without need for redirection.  Continue with current plan of care.        Function:   Eating Eating   Modified Consistency Diet: Yes Eating Assist Level: More than reasonable amount of time   Eating Set Up Assist For: Opening containers       Cognition Comprehension Comprehension assist level: Follows complex conversation/direction with extra time/assistive device  Expression   Expression assist level: Expresses complex ideas: With extra time/assistive device  Social Interaction Social Interaction assist level: Interacts appropriately with others - No medications needed.  Problem Solving Problem solving assist level: Solves complex 90% of the time/cues < 10% of the time  Memory Memory assist level: Assistive device: No helper     Pain Pain Assessment Pain Assessment: No/denies pain  Therapy/Group: Individual Therapy  Carmelia Roller., [place] [phone]  [place] 12/20/2015,  5:02 PM

## 2015-12-20 NOTE — Progress Notes (Signed)
Occupational Therapy Session Note  Patient Details  Name: Samantha Kline MRN: [id number] Date of Birth: 1960-09-19  Today's Date: 12/20/2015 OT Individual Time: 0903-1003 OT Individual Time Calculation (min): 60 min    Short Term Goals: Week 4:  OT Short Term Goal 1 (Week 4): Pt will tolerate standing at the sink for grooming task for 8 mins without rest break  OT Short Term Goal 2 (Week 4): Pt will ambulate into the bathroom and transfer on/off toilet with Mod A OT Short Term Goal 3 (Week 4): Pt will don/doff TED hose with Min A OT Short Term Goal 4 (Week 4): Pt will maintain dynamic standing balance while reaching outside base of support with Min A  Skilled Therapeutic Interventions/Progress Updates:   Pt seen for skilled OT session addressing pt and family education, ADL/self-care participation, functional transfers, and activity tolerance. OT removed L long finger extension splint and instructed pt on finger/hand exercises to reduce swelling and improve finger ROM.  OT educated spouse on how to safely assist pt with ADL/self-care and functional shower/toilet transfers. Pt ambulated w/ RW to bathroom and completed shower transfer with supervision. Pt completed bathing with set-up and supervision for safety. Pt required set-up A for dressing, then returned to supine in bed to don TED hose. OT demonstrated how to help pt don TED hose using plastic bag technique. Pt's spouse demonstrated understanding without difficulty. Pt then transferred out of bed and ambulated with RW to recliner. OT re-applied long finger splint at end of session.   Therapy Documentation Precautions:  Precautions Precautions: Fall Restrictions Weight Bearing Restrictions: No Pain: Pain Assessment Pain Assessment: No/denies pain  See Function Navigator for Current Functional Status.   Therapy/Group: Individual Therapy  Mal Amabilelisabeth S Shereena Berquist 12/20/2015, 4:59 PM

## 2015-12-20 NOTE — Op Note (Signed)
NAMLorna Few:  Kline, Samantha                ACCOUNT NO.:  [id number]  MEDICAL RECORD NO.:  [id number]  LOCATION:                                 FACILITY:  PHYSICIAN:  Artist PaisMatthew A. Joda Braatz, M.D.DATE OF BIRTH:  06-Jan-1961  DATE OF PROCEDURE:  12/18/2015 DATE OF DISCHARGE:                              OPERATIVE REPORT   PREOPERATIVE DIAGNOSIS:  Left long finger dorsal PIP dislocation.  POSTOPERATIVE DIAGNOSIS:  Left long finger dorsal PIP dislocation.  PROCEDURE:  Closed reduction, above.  SURGEON:  Artist PaisMatthew A. Mina MarbleWeingold, MD.  ASSISTANT:  None.  ANESTHESIA:  General.  COMPLICATIONS:  None.  DRAINS:  None.  DESCRIPTION OF PROCEDURE:  The patient was taken to the operating suite after induction of adequate general anesthetic.  We placed gentle traction and hyperextension across the left long finger proximal phalangeal joint and able to easily reduce the PIP joint dislocation. We then used intraoperative fluoroscopy to determine that was reduced. We placed in well-padded dorsal splint with 30 degrees of flexion.  The patient tolerated the procedure well and went to recovery room in stable fashion.     Artist PaisMatthew A. Mina MarbleWeingold, M.D.   ______________________________ Artist PaisMatthew A. Mina MarbleWeingold, M.D.    MAW/MEDQ  D:  12/18/2015  T:  12/18/2015  Job:  [id number]

## 2015-12-20 NOTE — Plan of Care (Signed)
Problem: RH Bathing Goal: LTG Patient will bathe with assist, cues/equipment (OT) LTG: Patient will bathe specified number of body parts with assist with/without cues using equipment (position)  (OT)  Goals upgraded 2/2 improved activity tolerance  Problem: RH Dressing Goal: LTG Patient will perform lower body dressing w/assist (OT) LTG: Patient will perform lower body dressing with assist, with/without cues in positioning using equipment (OT)  Goal upgraded 2/2 increased activity tolerance  Problem: RH Toileting Goal: LTG Patient will perform toileting w/assist, cues/equip (OT) LTG: Patient will perform toiletiing (clothes management/hygiene) with assist, with/without cues using equipment (OT)  Goal upgraded 2/2 improved activity tolerance   Problem: RH Toilet Transfers Goal: LTG Patient will perform toilet transfers w/assist (OT) LTG: Patient will perform toilet transfers with assist, with/without cues using equipment (OT)  Goal upgraded 2/2 improved activity tolerance  Problem: RH Tub/Shower Transfers Goal: LTG Patient will perform tub/shower transfers w/assist (OT) LTG: Patient will perform tub/shower transfers with assist, with/without cues using equipment (OT)  Goal upgraded 2/2 improved activity tolerance  Comments: Goals upgraded 2/2 improved activity tolerance and improved medical status

## 2015-12-21 ENCOUNTER — Inpatient Hospital Stay (HOSPITAL_COMMUNITY): Payer: Managed Care, Other (non HMO) | Admitting: Physical Therapy

## 2015-12-21 ENCOUNTER — Inpatient Hospital Stay (HOSPITAL_COMMUNITY): Payer: Managed Care, Other (non HMO) | Admitting: Occupational Therapy

## 2015-12-21 ENCOUNTER — Inpatient Hospital Stay (HOSPITAL_COMMUNITY): Payer: Managed Care, Other (non HMO) | Admitting: Speech Pathology

## 2015-12-21 LAB — URINE MICROSCOPIC-ADD ON

## 2015-12-21 LAB — URINALYSIS, ROUTINE W REFLEX MICROSCOPIC
Bilirubin Urine: NEGATIVE
GLUCOSE, UA: NEGATIVE mg/dL
Ketones, ur: NEGATIVE mg/dL
Nitrite: NEGATIVE
PH: 7.5 (ref 5.0–8.0)
PROTEIN: NEGATIVE mg/dL
Specific Gravity, Urine: 1.011 (ref 1.005–1.030)

## 2015-12-21 NOTE — Progress Notes (Signed)
Occupational Therapy Session Note  Patient Details  Name: Samantha Kline MRN: [id number] Date of Birth: 05/16/1960  Today's Date: 12/21/2015 OT Individual Time: 1001-1100 OT Individual Time Calculation (min): 59 min   Short Term Goals: Week 4:  OT Short Term Goal 1 (Week 4): Pt will tolerate standing at the sink for grooming task for 8 mins without rest break  OT Short Term Goal 2 (Week 4): Pt will ambulate into the bathroom and transfer on/off toilet with Mod A OT Short Term Goal 3 (Week 4): Pt will don/doff TED hose with Min A OT Short Term Goal 4 (Week 4): Pt will maintain dynamic standing balance while reaching outside base of support with Min A  Skilled Therapeutic Interventions/Progress Updates:   Pt seen for skilled OT treatment with focus on general strengthening, ADL participation, and standing endurance. Pt ambulated to the patient laundry room w/ RW and supervision. She was able to bend and reach into dryer to pull out clothing without LOB. Pt stood for ~10 mins w/ supervision to fold clothes.She was able to don shirt and pants with supervision and ambulate 250 feet w/ RW , supervision, and without rest break. OT then removed L long finger splint, completed flexion/extension exercises, then re-applied extension splint. Pt left seated in recliner at end of session with needs met.   Therapy Documentation Precautions:  Precautions Precautions: Fall Restrictions Weight Bearing Restrictions: No Pain: Pain Assessment Pain Assessment: No/denies pain ADL:  See Function Navigator for Current Functional Status.   Therapy/Group: Individual Therapy  Valma Cava 12/21/2015, 4:00 PM

## 2015-12-21 NOTE — Progress Notes (Signed)
[place] PHYSICAL MEDICINE & REHABILITATION     PROGRESS NOTE  Subjective/Complaints:  Pt seen sitting up in her chair this AM.  She states she slept  Very well overnight.  She feels nauseated this AM, but it is improving.    ROS: +Nausea. Denies CP, SOB, N/V/D.  Objective: Vital Signs: Blood pressure 139/74, pulse 86, temperature 98.4 F (36.9 C), temperature source Oral, resp. rate 18, height 5\' 4"  (1.626 m), weight 103.4 kg (228 lb), SpO2 100 %. No results found.  Recent Labs  12/19/15 0536  WBC 3.4*  HGB 7.3*  HCT 23.0*  PLT 416*    Recent Labs  12/19/15 0536 12/19/15 1105  NA 133* 134*  K 3.9 3.5  CL 105 104  GLUCOSE 86 101*  BUN 10 10  CREATININE 0.64 0.68  CALCIUM 8.1* 8.6*   CBG (last 3)  No results for input(s): GLUCAP in the last 72 hours.  Wt Readings from Last 3 Encounters:  12/21/15 103.4 kg (228 lb)  11/23/15 60.6 kg (133 lb 11.2 oz)  10/31/15 58.6 kg (129 lb 3 oz)    Physical Exam:  BP 139/74 (BP Location: Right Arm)   Pulse 86   Temp 98.4 F (36.9 C) (Oral)   Resp 18   Ht 5\' 4"  (1.626 m)   Wt 103.4 kg (228 lb)   SpO2 100%   BMI 39.14 kg/m  Constitutional: Vital signs are normal. NAD. She appears cachectic.   HENT: Normocephalic and atraumatic.  Eyes: Conjunctivae and EOM are normal.  Cardiovascular: Regular rate and rhythm. No murmur heard. Respiratory: Effort normal and breath sounds normal. No stridor. No respiratory distress. She has no wheezes.  GI: Soft. Bowel sounds are normal. She exhibits no distension. There is no tenderness.  Musculoskeletal:  Edema and tenderness of 3rd digit LUE, splinted.   Muscle wasting all limbs.  Neurological:  A&O X3. Motor:  B/l UE: 4+/5 deltoid, biceps, triceps, wrist and HI (improving)  B/l LE: 4/5 HF, 4+/5 KE/KF and 5/5 ADF/PF (improving)  Skin: Skin is warm and dry. No rash noted. No erythema.  2 sacral decubitus ulcers with moisture associated ulcers healing (not examined today).   Psychiatric: Her affect is normal. Cognition and memory appear normal.   Assessment/Plan: 1. 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 Physiatrist is providing close team supervision and 24 hour management of active medical problems listed below. Physiatrist and rehab team continue to assess barriers to discharge/monitor patient progress toward functional and medical goals.  Function:  Bathing Bathing position   Position: Shower  Bathing parts Body parts bathed by patient: Right arm, Left arm, Chest, Abdomen, Front perineal area, Buttocks, Right upper leg, Left upper leg, Right lower leg, Left lower leg Body parts bathed by helper: Back  Bathing assist Assist Level: Supervision or verbal cues, Set up, More than reasonable time   Set up : To obtain items  Upper Body Dressing/Undressing Upper body dressing   What is the patient wearing?: Hospital gown     Pull over shirt/dress - Perfomed by patient: Thread/unthread right sleeve, Thread/unthread left sleeve, Put head through opening Pull over shirt/dress - Perfomed by helper: Put head through opening Button up shirt - Perfomed by patient: Thread/unthread left sleeve Button up shirt - Perfomed by helper: Thread/unthread right sleeve, Pull shirt around back, Button/unbutton shirt    Upper body assist Assist Level: Set up   Set up :  To obtain clothing/put away  Lower Body Dressing/Undressing Lower body dressing   What is the patient wearing?: Socks, [employer] - Performed by patient: Thread/unthread right underwear leg, Thread/unthread left underwear leg, Pull underwear up/down Underwear - Performed by helper: Thread/unthread right underwear leg, Thread/unthread left underwear leg, Pull underwear up/down Pants- Performed by patient: Thread/unthread right pants leg, Thread/unthread left pants leg, Pull pants up/down,  Fasten/unfasten pants Pants- Performed by helper: Thread/unthread left pants leg Non-skid slipper socks- Performed by patient: Don/doff left sock, Don/doff right sock Non-skid slipper socks- Performed by helper: Don/doff right sock, Don/doff left sock               TED Hose - Performed by helper: Don/doff left TED hose, Don/doff right TED hose  Lower body assist Assist for lower body dressing: Touching or steadying assistance (Pt > 75%)      Toileting Toileting Toileting activity did not occur: No continent bowel/bladder event Toileting steps completed by patient: Adjust clothing prior to toileting, Performs perineal hygiene, Adjust clothing after toileting Toileting steps completed by helper: Adjust clothing after toileting, Adjust clothing prior to toileting Toileting Assistive Devices: Grab bar or rail  Toileting assist Assist level: Supervision or verbal cues   Transfers Chair/bed transfer Chair/bed transfer activity did not occur: Safety/medical concerns Chair/bed transfer method: Ambulatory Chair/bed transfer assist level: Touching or steadying assistance (Pt > 75%) Chair/bed transfer assistive device: Biochemist, clinical lift: Landscape architect Ambulation activity did not occur: Safety/medical concerns   Max distance: 250 ft Assist level: Supervision or verbal cues   Wheelchair   Type: Manual Max wheelchair distance: 264ft Assist Level: Supervision or verbal cues  Cognition Comprehension Comprehension assist level: Follows complex conversation/direction with extra time/assistive device  Expression Expression assist level: Expresses complex ideas: With extra time/assistive device  Social Interaction Social Interaction assist level: Interacts appropriately with others - No medications needed.  Problem Solving Problem solving assist level: Solves complex 90% of the time/cues < 10% of the time  Memory Memory assist level: Assistive device: No helper     Medical Problem List and Plan: 1.  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  DVT Prophylaxis/Anticoagulation: Mechanical: Sequential compression devices, below knee Bilateral lower extremities 3. Pain Management: Tylenol prn 4. Mood: LCSW to follow for evaluation and support.  5. Neuropsych: This patient is not fully capable of making decisions on her own behalf. 6. Skin/Wound Care: Ordered air mattress overlay due to malnutrition, poor intake   2 sacral decubiti, pressure relief, keep dry, protein supplementation, foam dressing 7. Fluids/Electrolytes/Nutrition:  Continue dextrose for now.   Liquid diet advanced to soft diet at present, will cont to advance as tolerated 8. HIV/AIDS with MAC?:  On descovy, ticovey  Biaxin and ethambutol d/ced per ID on 8/9.   Follow up per ID. Appreciate recs 9. Candida/CMV esophagitis:   Ganciclovir d/ced per ID on 8/24.   Fluconazole restarted on 8/24 per ID 10. FTT:   Appetite improving  11. Potential for pancytopenia/fluctuation:  Check labs periodically given medications  GCSF to support marrow at least twice a week per ID 12. Hyponatremia/Hypokalemia: Monitor lytes.   Na+134 on 8/28 (stable): Will cont to monitor, treat if necessary  K+ 3.5 on 8/28.   Cont to monitor 13. PCP prophylaxis: Bactrim discontinued due to hyponatremia. Has been treated for PCP infection and dapsone resumed 8/4  for prophylaxis.  14.  Hypoalbuminemia  Supplement started 8/6 15. Leukocytosis: Resolved, afebrile- will contact ID if pt becomes febrile 16. 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 Orthostasis: Improving  TEDS, abdominal binder  IVF qHS completed x3 days  Encouraged more fluids 18. Neutropenia  Will cont to monitor 19. Left 3rd digit PIP dorsal dislocation  After fall on  8/26  Confirmed by xray, attempted to reduce at bedside, unsuccessfully  OR 8/28, for which appears to be closed reduction.  Appreciate recs.   No further recs at present 20. Nausea  Likely secondary to medication  Will order UA to rule out infection  LOS (Days) 26 A FACE TO FACE EVALUATION WAS PERFORMED  Rosalea Withrow Karis Juba 12/21/2015 9:44 AM

## 2015-12-21 NOTE — Progress Notes (Signed)
Social Work Patient ID: Samantha Kline, female   DOB: 01/13/1961, 55 y.o.   MRN: [id number]   Met with pt to discuss team conference progression toward her goals and discharge Friday. Have made referral for transport chair and  Home health follow up via [facility]. Made aware tub bench is not a covered item, she will check with husband tonight and get back with this worker on what she wants to do. She is pleased with her progress and is ready To go home. Husband has been for education yesterday.

## 2015-12-21 NOTE — Progress Notes (Signed)
Speech Language Pathology Daily Session Note  Patient Details  Name: Samantha Kline MRN: [id number] Date of Birth: 11/30/1960  Today's Date: 12/21/2015 SLP Individual Time: 1403-1500 SLP Individual Time Calculation (min): 57 min   Short Term Goals: Week 4: SLP Short Term Goal 1 (Week 4): short term goals = long term goals   Skilled Therapeutic Interventions:  Pt was seen for skilled ST targeting goals for cognition and dysphagia.  SLP facilitated the session with skilled observations completed during a trial snack of regular textures and thin liquids.  Pt consumed snack with mod I use of unversal swallowing precautions and without overt s/s of aspiration.  No complaints of pain or difficulty swallowing. Pt and pt's husband educated on diet upgrade and that pt may go home with no dietary restrictions for textures but to stop eating any foods that cause pt pain when swallowing.  Therapist facilitated the session with repeated medication management task from earlier in the week to measure progress from initial attempt.  Pt required x1 supervision verbal cue to recognize and correct error but was otherwise mod I for organizing pills of varying frequencies into a QD pill box.  Pt was left in recliner with husband at bedside and call bell within reach.  Continue per current plan of care.    Function:  Eating Eating   Modified Consistency Diet: No Eating Assist Level: Swallowing techniques: self managed           Cognition Comprehension Comprehension assist level: Follows complex conversation/direction with extra time/assistive device  Expression   Expression assist level: Expresses complex ideas: With extra time/assistive device  Social Interaction Social Interaction assist level: Interacts appropriately with others with medication or extra time (anti-anxiety, antidepressant).  Problem Solving Problem solving assist level: Solves complex 90% of the time/cues < 10% of the time  Memory Memory  assist level: More than reasonable amount of time    Pain Pain Assessment Pain Assessment: No/denies pain  Therapy/Group: Individual Therapy  Syrah Daughtrey, [name] 12/21/2015, 3:20 PM

## 2015-12-21 NOTE — Progress Notes (Signed)
Physical Therapy Session Note  Patient Details  Name: Samantha Kline MRN: [id number] Date of Birth: 1960-12-02  Today's Date: 12/21/2015 PT Individual Time: 1502-1626 PT Individual Time Calculation (min): 84 min    Short Term Goals: Week 3:  PT Short Term Goal 1 (Week 3): Pt will maintain standing balance with Mod A. PT Short Term Goal 1 - Progress (Week 3): Met PT Short Term Goal 2 (Week 3): Pt will demonstrate bed mobility (supine<>sit, rolling L<>R) consistently with supervision and hospital bed features.  PT Short Term Goal 2 - Progress (Week 3): Met PT Short Term Goal 3 (Week 3): Pt will ambulate 35 ft with LRAD & min A.  PT Short Term Goal 3 - Progress (Week 3): Met PT Short Term Goal 4 (Week 3): Pt will negotiate 4 steps (6") with B rails & min A for strengthening. PT Short Term Goal 4 - Progress (Week 3): Met Week 4:  PT Short Term Goal 1 (Week 4): STG = LTG due to estimated d/c date.   Skilled Therapeutic Interventions/Progress Updates:     Patient received sitting in recliner and agreeable to PT. Per MD request therapy performed with ted hose in place and no abdominal binder. Patient noted to have increased HR following prolonged gait training to 142bpm that reduced to 115 in less than 1 minute of rest.   Gait with supervision A with RW over level surface in controlled environment x 124f, 1272f 10078fGait training over unlevel surface including cement sidewalk x 100f42fth supervision A use of RW. Community simulated gait through gift shop x 150ft65fh supervision A. PT instructed patient in gait training without AD x 60ft 35f min A; patient noted to have inconsistent step width and noted causing slight LOB when steps crossed midline. Throughout gait training PT providd min cues for AD management as increased step width to improve stability.    PT instructed patient in [place] mob[place], with supervision A and min cues for improved use of R UE for straight trajectory x 150ft a55f80ft. P56fnt required prolonged rest break following distonce of 150ft due49ffatigue.   Patient required to use restroom for continent bladded evacuation. Toilet transfer with supervision A and slight use of rail in bathroom. Following and prior to Toileting, patient was able to manage clothing with supervision A.   Throughout treatment patient performed sit<>stand Transfers from various surface heights, with supervision A with RW. PT provided min cues for proper anterior weight shift for sit>stand from low seat height.   Patient left sitting in recliner with call bell in reach and all needs met.       Therapy Documentation Precautions:  Precautions Precautions: Fall Restrictions Weight Bearing Restrictions: No General:   Vital Signs: Therapy Vitals Temp: 98.3 F (36.8 C) Temp Source: Oral Pulse Rate: 88 Resp: 18 BP: 120/75 Patient Position (if appropriate): Sitting Oxygen Therapy SpO2: 98 % O2 Device: Not Delivered Pain: Pain Assessment Pain Assessment: No/denies pain See Function Navigator for Current Functional Status.   Therapy/Group: Individual Therapy  Samantha Kline E Lorie Phenix7, 6:33 PM

## 2015-12-21 NOTE — Patient Care Conference (Signed)
Inpatient RehabilitationTeam Conference and Plan of Care Update Date: 12/21/2015   Time: 11:00 AM    Patient Name: Samantha Kline      Medical Record Number: [id number]  Date of Birth: Jul 28, 1960 Sex: Female         Room/Bed: 4W24C/4W24C-01 Payor Info: Payor: CIG[employer] / Plan: CIGNA MANAGED / Product Type: *No Product type* /    Admitting Diagnosis: Debility pipj dislocation  Admit Date/Time:  11/25/2015  5:22 PM Admission Comments: No comment available   Primary Diagnosis:  Physical debility Principal Problem: Physical debility  Patient Active Problem List   Diagnosis Date Noted  . Closed traumatic PIP dislocation   . Pain of finger of left hand   . Fall   . Drug-induced neutropenia (HCC)   . Abnormal laboratory test result   . Stage 2 skin ulcer of sacral region   . Mycobacterium avium complex (HCC)   . Thrush   . Palliative care encounter   . Goals of care, counseling/discussion   . HIV encephalopathy (HCC)   . Benign essential HTN   . Esophagitis, CMV (cytomegalovirus)   . Esophagitis, CMV   . Physical deconditioning   . Candida esophagitis (HCC)   . Anemia of chronic disease   . Nausea without vomiting 11/07/2015  . Pressure ulcer 11/07/2015  . Hyponatremia 11/06/2015  . Protein-calorie malnutrition, severe (HCC) 10/28/2015  . Human immunodeficiency virus (HIV) disease (HCC)   . 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 Weight loss     Expected Discharge Date: Expected Discharge Date: 12/23/15  Team Members Present: Physician leading conference: Dr. Maryla Morrow Social Worker Present: Dossie Der, LCSW Nurse Present: Carmie End, RN PT Present: Grier Rocher, PT;Victoria Hyacinth Meeker, PT OT Present: Roney Mans, OT;Other (comment) Gentry Fitz Doe-OT) SLP Present: Fae Pippin, SLP PPS Coordinator present : Tora Duck, RN, CRRN     Current Status/Progress Goal Weekly Team Focus  Medical   Function, mobility and cognitive  deficits secondary to HIV encephalopathy, making significant gains now  Improve endurance, safety, mobility, finger pain  See above   Bowel/Bladder   cont x2 LBM8/29  to remasin contx2  Monitor function   Swallow/Nutrition/ Hydration   advanced to regular textures and thin liquids with intermttent supervision   Mod I   monitor for toleration and education    ADL's   Supervision bathing, supervision ambulation w/ RW, supervision LB ADLs  Goals upgraded to supervision bathing, Mod I UB and LB dressing, supervision toileting, Mod I grooming, Supervision functional transfers  pt and family education, activity tolerance, ADL/self-care training,    Mobility   supervision for ambulation up to 250 ft with RW, negotiates 2 steps with RW & supervision & 16 steps with R rail & supervision, pt at supervision level for bed mobility (traditional bed) & transfers  mod I sitting balance, supervision standing balane, mod i bed mobility, supervision for transfers, supervision for ambulation with LRAD  pt/family education, d/c planning, gait training, endurance training, stair negotiation   Communication   Mod I   Supervision   education    Safety/Cognition/ Behavioral Observations  Supervision-Mod I  Supervision   carryover, education and discharge    Pain   no c/o pain  <3  Assess and treat pain during qshift   Skin   Stg 1 to sacrum- Gerhardt and dimethicone cream i use  No new skin breakdown/infection  Assess skin qshift      *See Care Plan and progress notes  for long and short-term goals.  Barriers to Discharge: hematological lability, wounds, cognition, antifungals, PIP dislocation, neutropenia, nausea    Possible Resolutions to Barriers:  Follow labs, cont meds, pressure relief, therapies, orthostasis improved, splinted finger    Discharge Planning/Teaching Needs:  Husband was here for family education yesterday and it went well. He is planning to take time off to be there with pt. Will order  DME and follow up home health in preparation for DC Friday.      Team Discussion:  Pt doing well have upgraded her goals to supervision level. Husband was in for education and feels ready for DC. Pt conitinues to vomit daily-MD feels medication driven. Checking UA. Try without binder and ted hose today. Diet also upgraded. Prepare for DC Friday  Revisions to Treatment Plan:  Upgraded goals and diet   Continued Need for Acute Rehabilitation Level of Care: The patient requires daily medical management by a physician with specialized training in physical medicine and rehabilitation for the following conditions: Daily direction of a multidisciplinary physical rehabilitation program to ensure safe treatment while eliciting the highest outcome that is of practical value to the patient.: Yes Daily medical management of patient stability for increased activity during participation in an intensive rehabilitation regime.: Yes Daily analysis of laboratory values and/or radiology reports with any subsequent need for medication adjustment of medical intervention for : Wound care problems;Other;Nutritional problems  Clifton Safley, Lemar LivingsRebecca G 12/21/2015, 1:35 PM

## 2015-12-22 ENCOUNTER — Inpatient Hospital Stay (HOSPITAL_COMMUNITY): Payer: Managed Care, Other (non HMO) | Admitting: Speech Pathology

## 2015-12-22 ENCOUNTER — Inpatient Hospital Stay (HOSPITAL_COMMUNITY): Payer: Managed Care, Other (non HMO) | Admitting: Physical Therapy

## 2015-12-22 ENCOUNTER — Inpatient Hospital Stay (HOSPITAL_COMMUNITY): Payer: Managed Care, Other (non HMO) | Admitting: Occupational Therapy

## 2015-12-22 DIAGNOSIS — N390 Urinary tract infection, site not specified: Secondary | ICD-10-CM

## 2015-12-22 LAB — COMPREHENSIVE METABOLIC PANEL
ALT: 21 U/L (ref 14–54)
AST: 29 U/L (ref 15–41)
Albumin: 1.9 g/dL — ABNORMAL LOW (ref 3.5–5.0)
Alkaline Phosphatase: 97 U/L (ref 38–126)
Anion gap: 3 — ABNORMAL LOW (ref 5–15)
BUN: 10 mg/dL (ref 6–20)
CO2: 27 mmol/L (ref 22–32)
Calcium: 8.2 mg/dL — ABNORMAL LOW (ref 8.9–10.3)
Chloride: 106 mmol/L (ref 101–111)
Creatinine, Ser: 0.75 mg/dL (ref 0.44–1.00)
GFR calc Af Amer: >60 mL/min (ref >60)
GFR calc non Af Amer: >60 mL/min (ref >60)
Glucose, Bld: 89 mg/dL (ref 65–99)
Potassium: 4.1 mmol/L (ref 3.5–5.1)
Sodium: 136 mmol/L (ref 135–145)
Total Bilirubin: 0.2 mg/dL — ABNORMAL LOW (ref 0.3–1.2)
Total Protein: 6.3 g/dL — ABNORMAL LOW (ref 6.5–8.1)

## 2015-12-22 LAB — CBC WITH DIFFERENTIAL/PLATELET
Band Neutrophils: 0 %
Basophils Absolute: 0 10*3/uL (ref 0.0–0.1)
Basophils Relative: 0 %
Blasts: 0 %
Eosinophils Absolute: 0.1 10*3/uL (ref 0.0–0.7)
Eosinophils Relative: 2 %
HCT: 27.1 % — ABNORMAL LOW (ref 36.0–46.0)
Hemoglobin: 8.6 g/dL — ABNORMAL LOW (ref 12.0–15.0)
Lymphocytes Relative: 41 %
Lymphs Abs: 1.4 10*3/uL (ref 0.7–4.0)
MCH: 31.4 pg (ref 26.0–34.0)
MCHC: 31.7 g/dL (ref 30.0–36.0)
MCV: 98.9 fL (ref 78.0–100.0)
Metamyelocytes Relative: 0 %
Monocytes Absolute: 0.5 10*3/uL (ref 0.1–1.0)
Monocytes Relative: 16 %
Myelocytes: 0 %
Neutro Abs: 1.3 10*3/uL — ABNORMAL LOW (ref 1.7–7.7)
Neutrophils Relative %: 41 %
Other: 0 %
Platelets: 425 10*3/uL — ABNORMAL HIGH (ref 150–400)
Promyelocytes Absolute: 0 %
RBC: 2.74 MIL/uL — ABNORMAL LOW (ref 3.87–5.11)
RDW: 18.2 % — ABNORMAL HIGH (ref 11.5–15.5)
WBC: 3.3 10*3/uL — ABNORMAL LOW (ref 4.0–10.5)
nRBC: 0 /100 WBC

## 2015-12-22 LAB — MAGNESIUM: Magnesium: 1.8 mg/dL (ref 1.7–2.4)

## 2015-12-22 LAB — PHOSPHORUS: PHOSPHORUS: 4 mg/dL (ref 2.5–4.6)

## 2015-12-22 MED ORDER — NITROFURANTOIN MONOHYD MACRO 100 MG PO CAPS
100.0000 mg | ORAL_CAPSULE | Freq: Two times a day (BID) | ORAL | Status: DC
  Administered 2015-12-22 – 2015-12-23 (×3): 100 mg via ORAL
  Filled 2015-12-22 (×3): qty 1

## 2015-12-22 MED ORDER — TBO-FILGRASTIM 300 MCG/0.5ML ~~LOC~~ SOSY
300.0000 ug | PREFILLED_SYRINGE | Freq: Once | SUBCUTANEOUS | Status: AC
  Administered 2015-12-22: 300 ug via SUBCUTANEOUS
  Filled 2015-12-22: qty 0.5

## 2015-12-22 MED ORDER — VALGANCICLOVIR HCL 450 MG PO TABS: 900.0000 mg | ORAL_TABLET | Freq: Two times a day (BID) | ORAL | Status: DC

## 2015-12-22 NOTE — Progress Notes (Signed)
Social Work Patient ID: Samantha Kline, female   DOB: 04-27-1960, 55 y.o.   MRN: [id number]  Met with pt to ask about the tub bench, she and husband have decided to get it on their own. Husband to order on-line. Working on discharge tomorrow. Pt feels ready to go home.

## 2015-12-22 NOTE — Progress Notes (Signed)
Orthopedic Tech Progress Note Patient Details:  Samantha Kline 1960/04/25 [id number]  Ortho Devices Type of Ortho Device: Finger splint Ortho Device/Splint Location: Lt middle finger Ortho Device/Splint Interventions: Ordered, Application   Clois Dupesvery S Mavric Cortright 12/22/2015, 11:27 AM

## 2015-12-22 NOTE — Progress Notes (Signed)
Speech Language Pathology Discharge Summary  Patient Details  Name: Samantha Kline MRN: [id number] Date of Birth: Aug 09, 1960  Today's Date: 12/22/2015 SLP Individual Time: 4034-7425 SLP Individual Time Calculation (min): 29 min    Skilled Therapeutic Interventions:  Pt was seen for skilled ST targeting cognitive goals and grad day activities.  SLP administered the [employer] standardized cognitive assessment to measure progress from initial evaluation.  Pt scored 27 out of 30 on assessment with deficits most notable for serial subtraction due to decreased working memory for mental math calculations.  Pt reports that she feels that her mentation has improved over the last week but she continues to notice mild memory changes.  Pt utilizing memory notebook with mod I during therapy session to record discharge information and therapist's recommendations.  All questions were answered ot pt's satisfaction at this time.  Pt left in recliner with call bell within reach.  Pt is ready for discharge tomorrow.     Patient has met 6 of 6 long term goals.  Patient to discharge at overall Modified Independent level.  Reasons goals not met: n/a   Clinical Impression/Discharge Summary: Pt made excellent gains while inpatient and is discharging having met 6 out of 6 long term goals.  Pt is discharging at an overall mod I level for semi-complex cognitive tasks but continues to endorse mild higher level memory changes due to prolonged hospitalization.  Pt may benefit from brief ST follow up at next level of care; however, SLP is hopeful that pt's memory will continue to clear as she returns home to a more familiar and structured environment.  Pt's diet has been upgraded back to regular textures which she is consuming with mod I use of swallowing precautions.  PO intake has been steadily improving.  Pt and family education is complete at this time.  Pt is discharging home with 24/7 supervision at discharge from family.     Care Partner:  Caregiver Able to Provide Assistance: Yes  Type of Caregiver Assistance: Physical;Cognitive  Recommendation:  24 hour supervision/assistance;Outpatient SLP  Rationale for SLP Follow Up: Maximize cognitive function and independence   Equipment: none recommended by SLP    Reasons for discharge: Discharged from hospital   Patient/Family Agrees with Progress Made and Goals Achieved: Yes   Function:  Eating Eating                 Cognition Comprehension Comprehension assist level: Follows complex conversation/direction with extra time/assistive device  Expression   Expression assist level: Expresses complex ideas: With extra time/assistive device  Social Interaction Social Interaction assist level: Interacts appropriately with others with medication or extra time (anti-anxiety, antidepressant).  Problem Solving Problem solving assist level: Solves complex 90% of the time/cues < 10% of the time  Memory Memory assist level: More than reasonable amount of time   Emilio Math 12/22/2015, 4:18 PM

## 2015-12-22 NOTE — Progress Notes (Signed)
Occupational Therapy Discharge Summary  Patient Details  Name: Samantha Kline MRN: [id number] Date of Birth: 1960/11/15  Today's Date: 12/22/2015 OT Individual Time: 0800-0900 OT Individual Time Calculation (min): 60 min   Patient has met 7 of 7 long term goals due to improved activity tolerance, improved balance, postural control, improved attention, improved awareness and improved coordination.  Patient to discharge at overall Supervision level.  Samantha Kline has made excellent progress during her stay at rehab. She has worked hard to progress to modified independent with most of her daily self-care tasks and supervision for functional transfers, Patient's care partner is independent to provide the necessary physical assistance at discharge.    Recommendation:  Patient will benefit from ongoing skilled OT services in home health setting to continue to advance functional skills in the area of BADL.  Equipment: rolling walker, tub-transfer bench, bedside commode  Reasons for discharge: treatment goals met and discharge from hospital  Patient/family agrees with progress made and goals achieved: Yes  Pt seen for skilled OT session with focus on ADL/self-care training at shower level, functional mobility, functional transfers, and pt education re: home safety modifications, ADL participation, and energy conservation techniques.  Pt was able to ambulate to the bathroom w/ Rw, transfer on/off toilet, and manage clothing for toileting with supervision. She completed peri-care w/o assistance, then ambulated to the shower and transferred into shower using tub bench with supervision. Pt bathed 10/10 body parts with supervision and completed LB and UB dressing with increased time. Pt reported L long-finger splint was soiled after having BM so she threw away entire splint. OT unable to acquire finger splint, so OT utilized tongue depressor and coband to splint and wrap long-finger.  Pt then ambulated in the  hallway with RW and supervision. She was left seated in recliner with needs met.     OT Discharge Precautions/Restrictions Precautions Precautions: Fall Restrictions Weight Bearing Restrictions: No Pain Pain Assessment Pain Assessment: No/denies pain Pain Score: 0-No pain ADL ADL Grooming: Modified independent Where Assessed-Grooming: Edge of bed Upper Body Bathing: Supervision/safety Where Assessed-Upper Body Bathing: Shower Lower Body Bathing: Supervision/safety Where Assessed-Lower Body Bathing: Shower Upper Body Dressing: Modified independent (Device) Where Assessed-Upper Body Dressing: Edge of bed Lower Body Dressing: Modified independent Where Assessed-Lower Body Dressing: Edge of bed Toileting: Supervision/safety Where Assessed-Toileting: Glass blower/designer: Close supervision Toilet Transfer Method: Counselling psychologist: Energy manager: Not assessed Social research officer, government: Close supervision Social research officer, government Method: Heritage manager: Radio broadcast assistant Vision/Perception  Vision- History Baseline Vision/History: Wears glasses Wears Glasses: Reading only Patient Visual Report: No change from baseline  Cognition Overall Cognitive Status: Within Functional Limits for tasks assessed Orientation Level: Oriented X4 Sustained Attention: Appears intact Memory: Appears intact Awareness: Appears intact Problem Solving: Appears intact Safety/Judgment: Appears intact Sensation Sensation Light Touch: Appears Intact Stereognosis: Not tested Hot/Cold: Not tested Proprioception: Appears Intact Coordination Gross Motor Movements are Fluid and Coordinated: Yes Fine Motor Movements are Fluid and Coordinated: Yes Coordination and Movement Description: WFL Motor  Motor Motor: Within Functional Limits Balance Balance Balance Assessed: Yes Static Sitting Balance Static Sitting - Balance Support: No upper  extremity supported Static Sitting - Level of Assistance: 7: Independent Dynamic Sitting Balance Dynamic Sitting - Balance Support: No upper extremity supported Dynamic Sitting - Level of Assistance: 6: Modified independent (Device/Increase time) Static Standing Balance Static Standing - Balance Support: No upper extremity supported Static Standing - Level of Assistance: 5: Stand by assistance Dynamic Standing Balance Dynamic  Standing - Balance Support: Right upper extremity supported Dynamic Standing - Level of Assistance: 5: Stand by assistance Extremity/Trunk Assessment RUE Assessment RUE Assessment: Within Functional Limits LUE Assessment LUE Assessment: Within Functional Limits   See Function Navigator for Current Functional Status.  Samantha Kline 12/22/2015, 12:46 PM

## 2015-12-22 NOTE — Progress Notes (Signed)
Physical Therapy Discharge Summary  Patient Details  Name: Samantha Kline MRN: [id number] Date of Birth: [birth year]  Today's Date: 12/22/2015 PT Individual Time:  [id number] AND 1600-1630 Calculated PT individual time: 54mn and 335m        Patient has met 10 of 10 long term goals due to improved activity tolerance, improved balance, improved postural control, increased strength, increased range of motion, functional use of  right upper extremity, right lower extremity, left upper extremity and left lower extremity, improved attention, improved awareness and improved coordination.  Patient to discharge at an ambulatory level Supervision.   Patient's care partner is independent to provide the necessary physical assistance at discharge.    Recommendation:  Patient will benefit from ongoing skilled PT services in home health setting to continue to advance safe functional mobility, address ongoing impairments in Endurance, strength, balance, and minimize fall risk.  Equipment: RoConservation officer, nature  Reasons for discharge: treatment goals met  Patient/family agrees with progress made and goals achieved: Yes   Treatment:  Session 1: Patient received sitting in recliner and agreeable to PT. PT instructed patient in grad day assessment, see below for results.   Patient instructed in car transfer with stand pivot technique as listed below with supervision A.   PT instruced patient in stair negotiation as list below and up/down 6 steps with 1 UE support and up/down 2 steps with UE support wall to simulate entrance into house.   Patient returned to room following gait training for [id number] RW and supervision Assist from PT.   Patient left sitting in recliner with call bell in reach.   Session 2:  Patient instructed in gait training for [id number] with RW and supervision A from PT with min cues for improved step width from PT.   Dynamic balance training for improved weight shifting without UE  support on Wii fit. titl table x 2, Penguin splash x 2. PT provided min A to prevent LOB as well as mod cues for improved use of ankle strategy for anterior and lateral weight shift. Patient responded very well to cues from PT and noted to only need supervision Assist for last bout of balance training.   Patient performed sit<>stand x 6 throughout treatment session with supervision A from PT for safety. Patient left sitting in recliner with call bell in reach and all needs met.       PT Discharge Precautions/RestrictionsPrecautions Precautions: Fall Restrictions Weight Bearing Restrictions: No Vital Signs   Pain Pain Assessment Pain Assessment: No/denies pain Pain Score: 0-No pain Vision/Perception     Cognition Overall Cognitive Status: Within Functional Limits for tasks assessed Orientation Level: Oriented X4 Sustained Attention: Appears intact Memory: Appears intact Awareness: Appears intact Problem Solving: Appears intact Safety/Judgment: Appears intact Sensation Sensation Light Touch: Appears Intact Proprioception: Appears Intact Coordination Gross Motor Movements are Fluid and Coordinated: Yes Fine Motor Movements are Fluid and Coordinated: Yes Coordination and Movement Description: WFL Motor  Motor Motor: Within Functional Limits  Mobility Bed Mobility Bed Mobility: Rolling Right;Rolling Left;Supine to Sit;Sit to Supine Rolling Right: 6: Modified independent (Device/Increase time) Rolling Left: 6: Modified independent (Device/Increase time) Supine to Sit: 6: Modified independent (Device/Increase time) Transfers Transfers: Yes Sit to Stand: 5: Supervision (with RW) Stand Pivot Transfers: 5: Supervision (with RW) Stand Pivot Transfer Details: Verbal cues for technique Locomotion  Ambulation Ambulation: Yes Ambulation/Gait Assistance: 5: Supervision Ambulation Distance (Feet): 180 Feet Assistive device: Rolling walker Ambulation/Gait Assistance Details:  Verbal cues for gait pattern Gait Gait:  Yes Gait Pattern: Impaired Gait Pattern: Narrow base of support Stairs / Additional Locomotion Stairs: Yes Stairs Assistance: 5: Supervision Stair Management Technique: Two rails Number of Stairs: 12 Height of Stairs: 6 Ramp: 5: Supervision Curb: 5: Psychiatric nurse: Yes Wheelchair Assistance: 5: Investment banker, operational Details: Verbal cues for Marketing executive: Both upper extremities Wheelchair Parts Management: Supervision/cueing Distance: 175f  Trunk/Postural Assessment  Cervical Assessment Cervical Assessment: Within FWater engineerThoracic Assessment: Within Functional Limits Lumbar Assessment Lumbar Assessment: Within Functional Limits (slight posterior tilt) Postural Control Postural Control: Within Functional Limits  Balance Balance Balance Assessed: Yes Static Sitting Balance Static Sitting - Balance Support: No upper extremity supported Static Sitting - Level of Assistance: 7: Independent Dynamic Sitting Balance Dynamic Sitting - Balance Support: No upper extremity supported Dynamic Sitting - Level of Assistance: 6: Modified independent (Device/Increase time) Static Standing Balance Static Standing - Balance Support: No upper extremity supported Static Standing - Level of Assistance: 5: Stand by assistance Dynamic Standing Balance Dynamic Standing - Balance Support: Right upper extremity supported Dynamic Standing - Level of Assistance: 5: Stand by assistance Extremity Assessment      RLE Assessment RLE Assessment: Within Functional Limits (Grossly 4/5 to 4+/5 throughout LE) LLE Assessment LLE Assessment: Within Functional Limits (grossly 4/5 to 4+/5 throughout LE )   See Function Navigator for Current Functional Status.  ALorie Phenix8/31/2017, 10:24 AM

## 2015-12-22 NOTE — Progress Notes (Signed)
PHYSICAL MEDICINE & REHABILITATION     PROGRESS NOTE  Subjective/Complaints:  Pt sitting up in her recliner, she is more energetic today.   She notes that her finger was not wrapped overnight after she got the dressing soiled.  ROS: Denies CP, SOB, N/V/D.  Objective: Vital Signs: Blood pressure 116/66, pulse 92, temperature 98 F (36.7 C), temperature source Oral, resp. rate 18, height 5\' 4"  (1.626 m), weight 103.4 kg (228 lb), SpO2 99 %. No results found.  Recent Labs  12/22/15 0443  WBC 3.3*  HGB 8.6*  HCT 27.1*  PLT 425*    Recent Labs  12/19/15 1105 12/22/15 0443  NA 134* 136  K 3.5 4.1  CL 104 106  GLUCOSE 101* 89  BUN 10 10  CREATININE 0.68 0.75  CALCIUM 8.6* 8.2*   CBG (last 3)  No results for input(s): GLUCAP in the last 72 hours.  Wt Readings from Last 3 Encounters:  12/21/15 103.4 kg (228 lb)  11/23/15 60.6 kg (133 lb 11.2 oz)  10/31/15 58.6 kg (129 lb 3 oz)    Physical Exam:  BP 116/66 (BP Location: Right Arm)   Pulse 92   Temp 98 F (36.7 C) (Oral)   Resp 18   Ht 5\' 4"  (1.626 m)   Wt 103.4 kg (228 lb)   SpO2 99%   BMI 39.14 kg/m  Constitutional: Vital signs are normal. NAD. She appears cachectic.   HENT: Normocephalic and atraumatic.  Eyes: Conjunctivae and EOM are normal.  Cardiovascular: Regular rate and rhythm. No murmur heard. Respiratory: Effort normal and breath sounds normal. No stridor. No respiratory distress. She has no wheezes.  GI: Soft. Bowel sounds are normal. She exhibits no distension. There is no tenderness.  Musculoskeletal:  Edema and tenderness of 3rd digit LUE.   Muscle wasting all limbs.  Neurological:  A&O X3. Motor:  B/l UE: 5/5 deltoid, biceps, triceps, wrist and HI (improving)  B/l LE: 4+/5 HF, 5/5 KE/KF and 5/5 ADF/PF (improving)  Skin: Skin is warm and dry. No rash noted. No erythema.  2 sacral decubitus ulcers with moisture associated ulcers healing (not examined today).  Psychiatric: Her affect  is normal. Cognition and memory appear normal.   Assessment/Plan: 1. 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 Physiatrist is providing close team supervision and 24 hour management of active medical problems listed below. Physiatrist and rehab team continue to assess barriers to discharge/monitor patient progress toward functional and medical goals.  Function:  Bathing Bathing position   Position: Shower  Bathing parts Body parts bathed by patient: Right arm, Left arm, Chest, Abdomen, Front perineal area, Buttocks, Right upper leg, Left upper leg, Right lower leg, Left lower leg Body parts bathed by helper: Back  Bathing assist Assist Level: Supervision or verbal cues   Set up : To obtain items  Upper Body Dressing/Undressing Upper body dressing   What is the patient wearing?: Pull over shirt/dress     Pull over shirt/dress - Perfomed by patient: Thread/unthread right sleeve, Thread/unthread left sleeve, Put head through opening, Pull shirt over trunk Pull over shirt/dress - Perfomed by helper: Put head through opening Button up shirt - Perfomed by patient: Thread/unthread left sleeve Button up shirt - Perfomed by helper: Thread/unthread right sleeve, Pull shirt around back, Button/unbutton shirt    Upper body assist Assist Level: More than reasonable time   Set up : To obtain  clothing/put away  Lower Body Dressing/Undressing Lower body dressing   What is the patient wearing?: Underwear, Pants, Non-skid slipper socks Underwear - Performed by patient: Thread/unthread right underwear leg, Pull underwear up/down, Thread/unthread left underwear leg Underwear - Performed by helper: Thread/unthread right underwear leg, Thread/unthread left underwear leg, Pull underwear up/down Pants- Performed by patient: Thread/unthread left pants leg, Thread/unthread right pants leg, Pull  pants up/down Pants- Performed by helper: Thread/unthread left pants leg Non-skid slipper socks- Performed by patient: Don/doff right sock, Don/doff left sock Non-skid slipper socks- Performed by helper: Don/doff right sock, Don/doff left sock               TED Hose - Performed by helper: Don/doff left TED hose, Don/doff right TED hose  Lower body assist Assist for lower body dressing: More than reasonable time      Toileting Toileting Toileting activity did not occur: No continent bowel/bladder event Toileting steps completed by patient: Adjust clothing prior to toileting, Performs perineal hygiene, Adjust clothing after toileting Toileting steps completed by helper: Adjust clothing after toileting, Adjust clothing prior to toileting Toileting Assistive Devices: Grab bar or rail  Toileting assist Assist level: Supervision or verbal cues   Transfers Chair/bed transfer Chair/bed transfer activity did not occur: Safety/medical concerns Chair/bed transfer method: Ambulatory Chair/bed transfer assist level: Supervision or verbal cues Chair/bed transfer assistive device: Environmental consultant, Consulting civil engineer lift: Landscape architect Ambulation activity did not occur: Safety/medical concerns   Max distance: 127ft Assist level: Supervision or verbal cues   Wheelchair   Type: Manual Max wheelchair distance: 129ft  Assist Level: Supervision or verbal cues  Cognition Comprehension Comprehension assist level: Follows complex conversation/direction with extra time/assistive device  Expression Expression assist level: Expresses complex ideas: With extra time/assistive device  Social Interaction Social Interaction assist level: Interacts appropriately with others with medication or extra time (anti-anxiety, antidepressant).  Problem Solving Problem solving assist level: Solves complex 90% of the time/cues < 10% of the time  Memory Memory assist level: More than reasonable amount of  time    Medical Problem List and Plan: 1.  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  DVT Prophylaxis/Anticoagulation: Mechanical: Sequential compression devices, below knee Bilateral lower extremities 3. Pain Management: Tylenol prn 4. Mood: LCSW to follow for evaluation and support.  5. Neuropsych: This patient is not fully capable of making decisions on her own behalf. 6. Skin/Wound Care: Ordered air mattress overlay due to malnutrition, poor intake   2 sacral decubiti, pressure relief, keep dry, protein supplementation, foam dressing 7. Fluids/Electrolytes/Nutrition:  Continue dextrose for now.   Advanced to regular diet on 8/30, will monitor tolerance 8. HIV/AIDS with MAC?:  On descovy, ticovey  Biaxin and ethambutol d/ced per ID on 8/9.   Follow up per ID. Appreciate recs 9. Candida/CMV esophagitis:   Ganciclovir d/ced per ID on 8/24.   Fluconazole restarted on 8/24 per ID 10. FTT:   Appetite improved 11. Potential for pancytopenia/fluctuation:  Check labs periodically given medications  GCSF to support marrow at least twice a week per ID 12. Hyponatremia/Hypokalemia: Monitor lytes.   Na+136 on 8/31 (stable): Will cont to monitor, treat if necessary  K+ 4.1 on 8/31  Cont to monitor 13. PCP prophylaxis: Bactrim discontinued due to hyponatremia. Has been treated for PCP infection and dapsone resumed 8/4  for prophylaxis.  14. Hypoalbuminemia  Supplement started 8/6 15. Leukocytosis: Resolved, afebrile-  will contact ID if pt becomes febrile 16. 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 Orthostasis: Improving  TEDS, abdominal binder  IVF qHS completed x3 days  Encouraged more fluids 18. Neutropenia  Will cont to monitor  Will consider another dose of medication 19. Left 3rd digit PIP dorsal dislocation  After  fall on 8/26  Confirmed by xray, attempted to reduce at bedside, unsuccessfully  OR 8/28, for which appears to be closed reduction.  Appreciate recs.   No further recs at present 20. Nausea  Likely secondary to medication +/- UTI 21. UTI  UA with bacteria, Ucx pending  Empiric macrobid started on 8/31     LOS (Days) 27 A FACE TO FACE EVALUATION WAS PERFORMED  Ankit Karis Juba 12/22/2015 8:59 AM

## 2015-12-23 DIAGNOSIS — B9620 Unspecified Escherichia coli [E. coli] as the cause of diseases classified elsewhere: Secondary | ICD-10-CM | POA: Diagnosis not present

## 2015-12-23 DIAGNOSIS — N390 Urinary tract infection, site not specified: Secondary | ICD-10-CM

## 2015-12-23 LAB — URINE CULTURE

## 2015-12-23 MED ORDER — DOLUTEGRAVIR SODIUM 50 MG PO TABS: 50.0000 mg | ORAL_TABLET | Freq: Every day | ORAL | 0 refills | Status: DC

## 2015-12-23 MED ORDER — FLUCONAZOLE 100 MG PO TABS: 100.0000 mg | ORAL_TABLET | ORAL | 0 refills | Status: DC

## 2015-12-23 MED ORDER — EMTRICITABINE-TENOFOVIR AF 200-25 MG PO TABS: 1.0000 | ORAL_TABLET | Freq: Every day | ORAL | 0 refills | Status: DC

## 2015-12-23 MED ORDER — VITAMIN D (ERGOCALCIFEROL) 1.25 MG (50000 UNIT) PO CAPS: 50000.0000 [IU] | ORAL_CAPSULE | ORAL | 0 refills | Status: DC

## 2015-12-23 MED ORDER — FERROUS SULFATE 325 (65 FE) MG PO TABS: 325.0000 mg | ORAL_TABLET | Freq: Every day | ORAL | 0 refills | Status: DC

## 2015-12-23 MED ORDER — DIMETHICONE 1 % EX CREA: TOPICAL_CREAM | Freq: Three times a day (TID) | CUTANEOUS | 0 refills | Status: DC

## 2015-12-23 MED ORDER — ENSURE ENLIVE PO LIQD: 237.0000 mL | Freq: Two times a day (BID) | ORAL | 12 refills | Status: DC

## 2015-12-23 MED ORDER — VITAMIN B-12 100 MCG PO TABS: 100.0000 ug | ORAL_TABLET | Freq: Every day | ORAL | 0 refills | Status: AC

## 2015-12-23 MED ORDER — MUSCLE RUB 10-15 % EX CREA: 1.0000 "application " | TOPICAL_CREAM | Freq: Two times a day (BID) | CUTANEOUS | 0 refills | Status: DC

## 2015-12-23 MED ORDER — NITROFURANTOIN MONOHYD MACRO 100 MG PO CAPS: 100.0000 mg | ORAL_CAPSULE | Freq: Two times a day (BID) | ORAL | 0 refills | Status: DC

## 2015-12-23 MED ORDER — DAPSONE 100 MG PO TABS: 100.0000 mg | ORAL_TABLET | Freq: Every day | ORAL | 0 refills | Status: DC

## 2015-12-23 MED ORDER — POTASSIUM CHLORIDE CRYS ER 20 MEQ PO TBCR: 20.0000 meq | EXTENDED_RELEASE_TABLET | Freq: Two times a day (BID) | ORAL | 0 refills | Status: DC

## 2015-12-23 NOTE — Progress Notes (Signed)
Pt discharged home with husband. Discharge instruction provided by Marissa NestlePam Love, PA. All questions answered, pt verbalized understanding. Pt c/o of nausea and a small amount of emesis earlier in the shift. Stated that it occurs periodically, and is positional. Requested Compazine, which she indicated was effective prior to discharge.  Pt escorted off unit with personal belonging by [place], [place].

## 2015-12-23 NOTE — Progress Notes (Addendum)
[facility] PHYSICAL MEDICINE & REHABILITATION     PROGRESS NOTE  Subjective/Complaints:  Pt sitting up in her bed, eating breakfast.  She has questions about follow up appointments after discharge.  She is excited to finally be going home and is very Adult nurse.   ROS: Denies CP, SOB, N/V/D.  Objective: Vital Signs: Blood pressure 127/78, pulse 94, temperature 98 F (36.7 C), temperature source Oral, resp. rate 17, height 5\' 4"  (1.626 m), weight 103.4 kg (228 lb), SpO2 99 %. No results found.  Recent Labs  12/22/15 0443  WBC 3.3*  HGB 8.6*  HCT 27.1*  PLT 425*    Recent Labs  12/22/15 0443  NA 136  K 4.1  CL 106  GLUCOSE 89  BUN 10  CREATININE 0.75  CALCIUM 8.2*   CBG (last 3)  No results for input(s): GLUCAP in the last 72 hours.  Wt Readings from Last 3 Encounters:  12/21/15 103.4 kg (228 lb)  11/23/15 60.6 kg (133 lb 11.2 oz)  10/31/15 58.6 kg (129 lb 3 oz)    Physical Exam:  BP 127/78 (BP Location: Right Arm)   Pulse 94   Temp 98 F (36.7 C) (Oral)   Resp 17   Ht 5\' 4"  (1.626 m)   Wt 103.4 kg (228 lb)   SpO2 99%   BMI 39.14 kg/m  Constitutional: Vital signs are normal. NAD. She appears cachectic.   HENT: Normocephalic and atraumatic.  Eyes: Conjunctivae and EOM are normal.  Cardiovascular: Regular rate and rhythm. No murmur heard. Respiratory: Effort normal and breath sounds normal. No stridor. No respiratory distress. She has no wheezes.  GI: Soft. Bowel sounds are normal. She exhibits no distension. There is no tenderness.  Musculoskeletal:  Edema and tenderness of 3rd digit LUE, splinted.   Muscle wasting all limbs.  Neurological:  A&O X3. Motor:  B/l UE: 5/5 deltoid, biceps, triceps, wrist and HI (improving)  B/l LE: 4+-5/5 HF, 5/5 KE/KF and 5/5 ADF/PF (improving)  Skin: Skin is warm and dry. No rash noted. No erythema.  2 sacral decubitus ulcers with moisture associated ulcers healing (improving).  Psychiatric: Her affect is normal.  Cognition and memory appear normal.   Assessment/Plan: 1. 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 Physiatrist is providing close team supervision and 24 hour management of active medical problems listed below. Physiatrist and rehab team continue to assess barriers to discharge/monitor patient progress toward functional and medical goals.  Function:  Bathing Bathing position   Position: Shower  Bathing parts Body parts bathed by patient: Right arm, Left arm, Chest, Abdomen, Front perineal area, Buttocks, Right upper leg, Left upper leg, Right lower leg, Left lower leg Body parts bathed by helper: Back  Bathing assist Assist Level: Supervision or verbal cues   Set up : To obtain items  Upper Body Dressing/Undressing Upper body dressing   What is the patient wearing?: Pull over shirt/dress     Pull over shirt/dress - Perfomed by patient: Thread/unthread right sleeve, Thread/unthread left sleeve, Put head through opening, Pull shirt over trunk Pull over shirt/dress - Perfomed by helper: Put head through opening Button up shirt - Perfomed by patient: Thread/unthread left sleeve Button up shirt - Perfomed by helper: Thread/unthread right sleeve, Pull shirt around back, Button/unbutton shirt    Upper body assist Assist Level: More than reasonable time   Set up : To obtain clothing/put away  Lower Body Dressing/Undressing  Lower body dressing   What is the patient wearing?: Underwear, Pants, Non-skid slipper socks Underwear - Performed by patient: Thread/unthread right underwear leg, Pull underwear up/down, Thread/unthread left underwear leg Underwear - Performed by helper: Thread/unthread right underwear leg, Thread/unthread left underwear leg, Pull underwear up/down Pants- Performed by patient: Thread/unthread left pants leg, Thread/unthread right pants leg, Pull pants  up/down Pants- Performed by helper: Thread/unthread left pants leg Non-skid slipper socks- Performed by patient: Don/doff right sock, Don/doff left sock Non-skid slipper socks- Performed by helper: Don/doff right sock, Don/doff left sock               TED Hose - Performed by helper: Don/doff left TED hose, Don/doff right TED hose  Lower body assist Assist for lower body dressing: More than reasonable time      Toileting Toileting Toileting activity did not occur: No continent bowel/bladder event Toileting steps completed by patient: Adjust clothing prior to toileting, Performs perineal hygiene, Adjust clothing after toileting Toileting steps completed by helper: Adjust clothing after toileting, Adjust clothing prior to toileting Toileting Assistive Devices: Grab bar or rail  Toileting assist Assist level: Supervision or verbal cues   Transfers Chair/bed transfer Chair/bed transfer activity did not occur: Safety/medical concerns Chair/bed transfer method: Ambulatory Chair/bed transfer assist level: Supervision or verbal cues Chair/bed transfer assistive device: Armrests, Biochemist, clinical lift: Magazine features editor activity did not occur: Safety/medical concerns   Max distance: 139ft Assist level: Supervision or verbal cues   Wheelchair   Type: Manual Max wheelchair distance: 139ft  Assist Level: Supervision or verbal cues  Cognition Comprehension Comprehension assist level: Follows complex conversation/direction with extra time/assistive device  Expression Expression assist level: Expresses complex ideas: With extra time/assistive device  Social Interaction Social Interaction assist level: Interacts appropriately with others - No medications needed.  Problem Solving Problem solving assist level: Solves complex problems: With extra time  Memory Memory assist level: More than reasonable amount of time    Medical Problem List and Plan: 1.  Function,  mobility and cognitive deficits secondary to HIV encephalopathy  D/c today  Will see patient in 1-2 weeks for transitional care management 2.  DVT Prophylaxis/Anticoagulation: Mechanical: Sequential compression devices, below knee Bilateral lower extremities 3. Pain Management: Tylenol prn 4. Mood: LCSW to follow for evaluation and support.  5. Neuropsych: This patient is not fully capable of making decisions on her own behalf. 6. Skin/Wound Care: Ordered air mattress overlay due to malnutrition, poor intake   2 sacral decubiti, pressure relief, keep dry, protein supplementation, foam dressing 7. Fluids/Electrolytes/Nutrition:  Continue dextrose for now.   Advanced to regular diet on 8/30, tolerating 8. HIV/AIDS with MAC?:  On descovy, ticovey  Biaxin and ethambutol d/ced per ID on 8/9.   Follow up per ID. Appreciate recs 9. Candida/CMV esophagitis:   Ganciclovir d/ced per ID on 8/24.   Fluconazole restarted on 8/24 per ID, weekly 10. FTT:   Appetite improved 11. Potential for pancytopenia/fluctuation:  Check labs periodically given medications  GCSF to support marrow at least twice a week per ID 12. Hyponatremia/Hypokalemia: Monitor lytes.   Na+136 on 8/31 (stable): Will cont to monitor, treat if necessary  K+ 4.1 on 8/31  Cont to monitor 13. PCP prophylaxis: Bactrim discontinued due to hyponatremia. Has been treated for PCP infection and dapsone resumed 8/4  for prophylaxis.  14. Hypoalbuminemia  Supplement started 8/6 15. Leukocytosis: Resolved, afebrile 16. 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 Orthostasis: Improving  TEDS, abdominal binder  IVF qHS completed x3 days  Encouraged more fluids 18. Neutropenia  Will cont to monitor  Granix received again on 8/31  Pt to follow up with ID for further management 19. Left 3rd digit PIP dorsal dislocation  After fall on 8/26  Confirmed by xray, attempted to reduce at  bedside, unsuccessfully  OR 8/28, for which appears to be closed reduction.  Appreciate recs.   No further recs at present 20. Nausea: Improving  Likely secondary to medication +/- UTI 21. UTI  UA with bacteria, Ucx remains pending  Empiric macrobid started on 8/31     LOS (Days) 28 A FACE TO FACE EVALUATION WAS PERFORMED  Ankit Karis Jubanil Patel 12/23/2015 9:03 AM

## 2015-12-23 NOTE — Progress Notes (Signed)
Social Work  Discharge Note  The overall goal for the admission was met for:   Discharge location: [facility] OFF TO PROVIDE 24 HR SUPERVISION   Length of Stay: Yes-28 DAYS  Discharge activity level: Yes-SUPERVISION LEVEL  Home/community participation: Yes  Services provided included: MD, RD, PT, OT, SLP, RN, CM, TR, Pharmacy, Neuropsych and SW  Financial Services: Private Insurance: [place]  Follow-up services arranged: Home Health: [facility]-PT,OT,RN, DME: [facility] Systems analyst and Patient/Family request agency HH: [facility], DME: [employer]  Comments (or additional information):HUSBAND WAS HERE FOR EDUCATION BOTH FEEL READY TO [place]. [facility] TO BE CONTACTING PT REGARDING HOME HEALTH AGENCY SET HER UP WITH.  Patient/Family verbalized understanding of follow-up arrangements: Yes  Individual responsible for coordination of the follow-up plan: SELF & ANDREW-HUSBAND  Confirmed correct DME delivered: Samantha Kline 12/23/2015    Samantha Kline

## 2015-12-23 NOTE — Discharge Summary (Signed)
Physician Discharge Summary  Patient ID: Samantha Kline MRN: [id number] DOB/AGE: 1960-11-24 55 y.o.  Admit date: 11/25/2015 Discharge date: 12/23/2015  Discharge Diagnoses:  Principal Problem:   HIV encephalopathy (HCC) Active Problems:   Human immunodeficiency virus (HIV) disease (HCC)   Protein-calorie malnutrition, severe (HCC)   Hyponatremia   Anemia of chronic disease   Benign essential HTN   Stage 2 skin ulcer of sacral region   Mycobacterium avium complex (HCC)   Thrush   Drug-induced neutropenia (HCC)   Closed traumatic PIP dislocation   E. coli UTI   Discharged Condition: stable.   Significant Diagnostic Studies: Dg Finger Middle Left  Result Date: 12/17/2015 CLINICAL DATA:  Left middle finger a after a fall today. Postreduction. EXAM: LEFT MIDDLE FINGER 2+V COMPARISON:  12/17/2015 FINDINGS: Persistent complete posterior dislocation of the middle phalanx of the left third finger with respect to the proximal phalanx. Overriding of the middle phalanx of about 6.3 mm. No acute fracture identified. IMPRESSION: Persistent complete dislocation of the proximal interphalangeal joint of the left third finger. Electronically Signed   By: Burman Nieves M.D.   On: 12/17/2015 23:03   Dg Finger Middle Left  Result Date: 12/17/2015 CLINICAL DATA:  Left middle finger swelling and pain 1 day after fall. Initial encounter. EXAM: LEFT MIDDLE FINGER 2+V COMPARISON:  None. FINDINGS: Three views study shows PIP joint dislocation with base of middle phalanx dislocated posteriorly. No associated fracture fragments are evident. IMPRESSION: PIP joint dislocation. Electronically Signed   By: Kennith Center M.D.   On: 12/17/2015 16:15    Labs:  Basic Metabolic Panel:  Recent Labs Lab 12/19/15 0536 12/19/15 1105 12/22/15 0443  NA 133* 134* 136  K 3.9 3.5 4.1  CL 105 104 106  CO2 22 23 27   GLUCOSE 86 101* 89  BUN 10 10 10   CREATININE 0.64 0.68 0.75  CALCIUM 8.1* 8.6* 8.2*  MG  --   --   1.8  PHOS  --   --  4.0    CBC:  Recent Labs Lab 12/19/15 0536 12/22/15 0443  WBC 3.4* 3.3*  NEUTROABS 1.7 1.3*  HGB 7.3* 8.6*  HCT 23.0* 27.1*  MCV 99.1 98.9  PLT 416* 425*    CBG: No results for input(s): GLUCAP in the last 168 hours.  Brief HPI:   Samantha Brownis a 55 y.o.femalewith unintentional 50 lbs weight loss over past few months, anorexia, SOB with progressive weakness. She was admitted 07/03-7/10/17 with fever due to PCP PNA and diagnosed with HIV. She was discharged to home on antiviral therapy but continued to have malaise, N/V with poor oral intake and inability to walk. She was readmitted on 07/17 with lethargy, confusion and weakness due to hyponatremia with Na- 110. She was treated with fluid boluses and kayexalate for management of hyperkalemia. CT head negative for infection or acute changes. Mentation improving and she has had improvement in electrolyte abnormality but decrease in ability to eat due to dysphagia. She was started on IV flucanazole for candida esophagitis.  She spiked temp of 103.4 on 07/29 and was started on empiric treatment for MAC with resolution of low grade fevers.  She has had decrease in viral load from 200K to 200 with increase in white count and concerns about risk of IRIS.  She underwent endoscopy (Dr. Ewing Schlein)  with biopsies to rule out CMV/HSV on 8/02 due to continued odynophagia. EGD showed ulcers and high CMV viral load.   Pancytopenia treated Granix and she was started on  Gancyclovir due to high likelihood of CMV esophagitis. She has had improvement in WBC from 1.6-->10.3 and platelets from 115-->195.  She continues to have poor po intake but has refused feeding tube or PEG.  Hyponatremia improving with electrolyte abnormalities being treated as needed. She continues to be  Debilitated but showing increase in voice volume and stabilization of medical status.  Therapy ongoing and CIR recommended for follow up therapy.    Hospital Course:  Samantha Kline was admitted to rehab 11/25/2015 for inpatient therapies to consist of PT, ST and OT at least three hours five days a week. Past admission physiatrist, therapy team and rehab RN have worked together to provide customized collaborative inpatient rehab.  She has had issues with tachycardia due to deconditioning but has also has issues with orthostatic changes requiring IVF initially till po intake and endurance improved. Abdominal binder and TEDs were added to help maintain BP.  She was unable to tolerate low dose BB therefore this was discontinued. Ensure supplements/magic cup as offered 4-5 times a day to help with intake. Diet was downgraded to full liquids till oral symptoms improved.  At discharge, she is tolerating regular textures and Marinol was discontinued.   She completed 3 week course of gancyclovir on 09/24 and no further treatment needed per ID. Hyponatremia is resolving and renal status is stable. She receive two doses of Granix during her stay --last on 9/01 to help manage neutropenia.  Anemia of chronic disease is stable and repeat CBC to be drawn by [facility] in 10 days with results to Dr. Daiva Eves. 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 As mobility improved foley was discontinued and she is voiding without difficulty. She has had issue with nocturia but did not want to trial any anticholinergics to prevent SE. Repeat UCS of 8/30 showed 80,000 colonies of E coli and she is to complete 7 day course macrodantin after discharge.   Biaxin and ethambutol were discontinued on 8/9 and Weekly diflucan was started on 8/24. She is tolerating Descovey and Ticovey with recheck labs showing decrease in HIV load to 40.  Palliative care was consulted to discuss disease process and GOC with patient and family. She did sustain a fall with subsequent Left 3 rd digit PIP dorsal dislocation on 8/26 which required close reduction in OR by  Dr.Weingold. She is to continue splint on her finger and follow up with ortho on 9/5 for evaluation/dressing change. Her mood and outlook has improved and she has progressed to supervision level.  Follow up HHPT, HHOT and HHRN was recommended and is to be arranged by [facility] per [facility] Preference after discharge.    Rehab course: During patient's stay in rehab weekly team conferences were held to monitor patient's progress, set goals and discuss barriers to discharge. At admission, patient required total assist with mobility and basic self care tasks. She demonstrated severe cognitive impairments affecting memory, initiation, attention and awareness of deficits. She has had improvement in activity tolerance, balance, postural control, as well as ability to compensate for deficits. She is has had improvement in functional use RUE/LUE  and RLE/LLE as well as improved awareness. She is able to utilize memory strategies independently and has scored 27/30 on MoCA at discharge. She is able to complete semi-complex cognitive tasks at modified independent but continues to endorse mild higher level cognitive memory changes due to prolonged hospitalization. She is tolerating regular textures without s/s of aspiration.  She is able to complete ADL tasks at supervision level and is able to ambulate 160' X2 with RW and supervisor cues for gait quality. Family education was completed by husband who will provide supervision after discharge.      Disposition: 01-Home or Self Care  Diet: As tolerated.  Special Instructions: 1. Use supplements between meals. 2. Needs CBC rechecked in 10 days with results to ID.     Medication List    STOP taking these medications   albuterol (2.5 MG/3ML) 0.083% nebulizer solution Commonly known as:  PROVENTIL   amitriptyline 10 MG tablet Commonly known as:  ELAVIL   antiseptic oral rinse 0.05 % Liqd solution Commonly known as:  CPC / CETYLPYRIDINIUM CHLORIDE 0.05%    clarithromycin 500 MG tablet Commonly known as:  BIAXIN   dextrose 5 % and 0.9% NaCl 5-0.9 % infusion   dronabinol 2.5 MG capsule Commonly known as:  MARINOL   ethambutol 100 MG tablet Commonly known as:  MYAMBUTOL   fluconazole 400-0.9 MG/200ML-% IVPB Commonly known as:  DIFLUCAN   ganciclovir 150 mg in sodium chloride 0.9 % 100 mL     TAKE these medications   dapsone 100 MG tablet Take 1 tablet (100 mg total) by mouth daily.   dimethicone 1 % cream Apply topically 3 (three) times daily after meals.   dolutegravir 50 MG tablet Commonly known as:  TIVICAY Take 1 tablet (50 mg total) by mouth daily at 12 noon.   emtricitabine-tenofovir AF 200-25 MG tablet Commonly known as:  DESCOVY Take 1 tablet by mouth daily. What changed:  when to take this   feeding supplement (ENSURE ENLIVE) Liqd Take 237 mLs by mouth 2 (two) times daily between meals. What changed:  when to take this   ferrous sulfate 325 (65 FE) MG tablet Take 1 tablet (325 mg total) by mouth daily with breakfast.   fluconazole 100 MG tablet Commonly known as:  DIFLUCAN Take 1 tablet (100 mg total) by mouth once a week. On thursdays   multivitamin with minerals Tabs tablet Take 1 tablet by mouth daily.   MUSCLE RUB 10-15 % Crea Apply 1 application topically 2 (two) times daily. To neck and shoulders   nitrofurantoin (macrocrystal-monohydrate) 100 MG capsule Commonly known as:  MACROBID Take 1 capsule (100 mg total) by mouth every 12 (twelve) hours.   potassium chloride SA 20 MEQ tablet Commonly known as:  K-DUR,KLOR-CON Take 1 tablet (20 mEq total) by mouth 2 (two) times daily.   vitamin B-12 100 MCG tablet Commonly known as:  CYANOCOBALAMIN Take 1 tablet (100 mcg total) by mouth daily. What changed:  Another medication with the same name was removed. Continue taking this medication, and follow the directions you see here.   Vitamin D (Ergocalciferol) 50000 units Caps capsule Commonly known as:   DRISDOL Take 1 capsule (50,000 Units total) by mouth every Friday.      Follow-up Information    Ankit Karis Juba, MD .   Specialty:  Physical Medicine and Rehabilitation Why:  office will call you for follow up appointment Contact information: [address] [place] [address] [phone]        Acey Lav, MD Follow up on 01/03/2016.   Specialty:  Infectious Diseases Why:  Appointment @ 4:15 pm Contact information: [address] [place] [address] [phone]        Marlowe Shores, MD. Call today.   Specialty:  Orthopedic Surgery Why:  for follow up next TUESDAY. Do not remove splint until seen in office.  Contact information: [address] [place] [address] [phone]        Ninetta LightsFURR,SARA, MD Follow up on 12/30/2015.   Specialty:  Internal Medicine Why:  Appointment @ 3:00 PM Contact information: [address] [place] [address] [phone]           Signed: Jacquelynn CreeLove, Pamela S 12/23/2015, 7:27 PM

## 2015-12-23 NOTE — Progress Notes (Signed)
Social Work Patient ID: Samantha Kline, female   DOB: 02/01/1961, 55 y.o.   MRN: [id number]  Met with pt to inform her the transport chair will be delivered to their home, hopefully today. She is waiting for her husband to get here so can begin discharge process. Home health agency will contact pt at home regarding when will come out to begin visits.

## 2015-12-23 NOTE — Plan of Care (Signed)
Problem: RH SAFETY Goal: RH STG ADHERE TO SAFETY PRECAUTIONS W/ASSISTANCE/DEVICE STG Adhere to Safety Precautions With mod Assistance/Device. Two assist with transfers  Outcome: Completed/Met Date Met: 12/23/15 Pt ambulating with RW

## 2015-12-23 NOTE — Discharge Instructions (Signed)
Inpatient Rehab Discharge Instructions  Samantha Kline Discharge date and time:  12/23/15  Activities/Precautions/ Functional Status: Activity: no lifting, driving, or strenuous exercise for till cleared by MD. Diet:  As tolerated.  Wound Care: keep wound clean and dry   Functional status:  ___ No restrictions     ___ Walk up steps independently _X__ 24/7 supervision/assistance   ___ Walk up steps with assistance ___ Intermittent supervision/assistance  ___ Bathe/dress independently ___ Walk with walker     _X__ Bathe/dress with assistance ___ Walk Independently    ___ Shower independently ___ Walk with assistance    ___ Shower with assistance _X__ No alcohol     ___ Return to work/school ________  Special Instructions:    COMMUNITY REFERRALS UPON DISCHARGE:    Home Health:   PT, OT, RN  Agency:[facility]  Phone: [phone]  Date of last service:12/23/2015 WILL CONTACT YOU AT HOME TO INFORM OF VISIT    Medical Equipment/Items Ordered:TRANSPORT CHAIR  Agency/Supplier:[facility] TO BE DELIVERED TO HOME  Other:[phone]     My questions have been answered and I understand these instructions. I will adhere to these goals and the provided educational materials after my discharge from the hospital.  Patient/Caregiver Signature _______________________________ Date __________  Clinician Signature _______________________________________ Date __________  Please bring this form and your medication list with you to all your follow-up doctor's appointments.

## 2015-12-27 ENCOUNTER — Telehealth: Payer: Self-pay | Admitting: *Deleted

## 2015-12-27 DIAGNOSIS — S63283A Dislocation of proximal interphalangeal joint of left middle finger, initial encounter: Secondary | ICD-10-CM | POA: Insufficient documentation

## 2015-12-27 NOTE — Telephone Encounter (Signed)
  Patient contacted, appointment established, address confirmed, visit packet mailed   Transitional Care Questions  Questions for our staff to ask patients on Transitional care 48 hour phone call:   1. Are you/is patient experiencing any problems since coming home? No Are there any questions regarding any aspect of care? No  2. Are there any questions regarding medications administration/dosing? No  Are meds being taken as prescribed? Yes  Patient should review meds with caller to confirm   3. Have there been any falls? No  4. Has Home Health been to the house and/or have they contacted you? Not Yet If not, have you tried to contact them? No Can we help you contact them? No   5. Are bowels and bladder emptying properly? Yes Are there any unexpected incontinence issues? No If applicable, is patient following bowel/bladder programs?  6. Any fevers, problems with breathing, unexpected pain? No   7. Are there any skin problems or new areas of breakdown? No  8. Has the patient/family member arranged specialty MD follow up (ie cardiology/neurology/renal/surgical/etc)? Still need to make appointment with Dr. Zenaida NieceVan damme  Can we help arrange? No   9. Does the patient need any other services or support that we can help arrange? No  10. Are caregivers following through as expected in assisting the patient? Yes   11. Has the patient quit smoking, drinking alcohol, or using drugs as recommended? Yes

## 2015-12-28 LAB — ACID FAST CULTURE WITH REFLEXED SENSITIVITIES (MYCOBACTERIA): Acid Fast Culture: NEGATIVE

## 2015-12-28 LAB — ACID FAST CULTURE WITH REFLEXED SENSITIVITIES

## 2015-12-30 DIAGNOSIS — R5383 Other fatigue: Secondary | ICD-10-CM | POA: Insufficient documentation

## 2016-01-03 ENCOUNTER — Encounter: Payer: Self-pay | Admitting: Infectious Disease

## 2016-01-03 ENCOUNTER — Ambulatory Visit: Payer: Self-pay | Admitting: *Deleted

## 2016-01-03 ENCOUNTER — Ambulatory Visit (INDEPENDENT_AMBULATORY_CARE_PROVIDER_SITE_OTHER): Payer: Managed Care, Other (non HMO) | Admitting: Infectious Disease

## 2016-01-03 VITALS — BP 137/81 | HR 112 | Temp 97.9°F | Wt 124.2 lb

## 2016-01-03 DIAGNOSIS — B370 Candidal stomatitis: Secondary | ICD-10-CM

## 2016-01-03 DIAGNOSIS — B3781 Candidal esophagitis: Secondary | ICD-10-CM

## 2016-01-03 DIAGNOSIS — B20 Human immunodeficiency virus [HIV] disease: Secondary | ICD-10-CM | POA: Diagnosis not present

## 2016-01-03 DIAGNOSIS — G9340 Encephalopathy, unspecified: Secondary | ICD-10-CM

## 2016-01-03 DIAGNOSIS — E43 Unspecified severe protein-calorie malnutrition: Secondary | ICD-10-CM

## 2016-01-03 DIAGNOSIS — B59 Pneumocystosis: Secondary | ICD-10-CM | POA: Diagnosis not present

## 2016-01-03 DIAGNOSIS — G9349 Other encephalopathy: Secondary | ICD-10-CM

## 2016-01-03 DIAGNOSIS — K208 Other esophagitis without bleeding: Secondary | ICD-10-CM

## 2016-01-03 DIAGNOSIS — D702 Other drug-induced agranulocytosis: Secondary | ICD-10-CM | POA: Diagnosis not present

## 2016-01-03 DIAGNOSIS — B258 Other cytomegaloviral diseases: Secondary | ICD-10-CM

## 2016-01-03 DIAGNOSIS — R634 Abnormal weight loss: Secondary | ICD-10-CM

## 2016-01-03 DIAGNOSIS — D638 Anemia in other chronic diseases classified elsewhere: Secondary | ICD-10-CM

## 2016-01-03 MED ORDER — EMTRICITABINE-TENOFOVIR AF 200-25 MG PO TABS: 1.0000 | ORAL_TABLET | Freq: Every day | ORAL | 3 refills | Status: DC

## 2016-01-03 MED ORDER — DAPSONE 100 MG PO TABS: 100.0000 mg | ORAL_TABLET | Freq: Every day | ORAL | 3 refills | Status: DC

## 2016-01-03 MED ORDER — DOLUTEGRAVIR SODIUM 50 MG PO TABS: 50.0000 mg | ORAL_TABLET | Freq: Every day | ORAL | 3 refills | Status: DC

## 2016-01-03 NOTE — Progress Notes (Signed)
Subjective:   Chief complaint:   Recent recurrent thrush   Patient ID: Samantha Kline, female    DOB: 1960-06-16, 55 y.o.   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   She had protracted stay and eventually went to inpatient rehab.   She completed 21 days of GCV for her CMV esophagitis supported by CSF for part of her therapy. She did continue to have neutropenia, leukopenia after coming off the GCV and her G-CSF.    Her virus responded very nicely to [place] and Descovy  She returns for followup and has gained another 20#. Samantha CovertAmbre Kline has been following her closely and ensuring that the patient is taking her meds.   Past Medical History:  Diagnosis Date  . Anemia   . HIV (human immunodeficiency virus infection) (HCC)    11/06/15 Family states it was just diagnosed this week  . Pneumonia 10/24/2015  . Vitamin D deficiency     Past Surgical History:  Procedure Laterality Date  . CLOSED REDUCTION FINGER WITH PERCUTANEOUS PINNING Left 12/18/2015   Procedure: CLOSED VS. OPEN REDUCTION OF LEFT LONG FINGER;  Surgeon: Dairl PonderMatthew Weingold, MD;  Location: [facility];  Service: Orthopedics;  Laterality: Left;  . ESOPHAGOGASTRODUODENOSCOPY N/A 11/23/2015   Procedure: ESOPHAGOGASTRODUODENOSCOPY (EGD);  Surgeon: Vida RiggerMarc Magod, MD;  [facility];  Service: Endoscopy;  Laterality: N/A;  . TUBAL LIGATION  1980s    Family History  Problem Relation Age of Onset  . Diabetes Father   . Heart attack Brother   . Hypertension Sister       Social History   Social History  . Marital status: Married    Spouse name: N/A  . Number of children: N/A  . Years of education: N/A   Social History Main Topics  . Smoking status: Never Smoker  . Smokeless tobacco: Never Used  . Alcohol use Yes     Comment: 10/24/2015 "nothing since the 1980s"  . Drug use: No  . Sexual activity: Not  Asked   Other Topics Concern  . None   Social History Narrative  . None    Allergies  Allergen Reactions  . Septra [Sulfamethoxazole-Trimethoprim]     Possible cause of hyponatremia     Current Outpatient Prescriptions:  .  dapsone 100 MG tablet, Take 1 tablet (100 mg total) by mouth daily., Disp: 90 tablet, Rfl: 3 .  dimethicone 1 % cream, Apply topically 3 (three) times daily after meals., Disp: 113 g, Rfl: 0 .  dolutegravir (TIVICAY) 50 MG tablet, Take 1 tablet (50 mg total) by mouth daily., Disp: 90 tablet, Rfl: 3 .  emtricitabine-tenofovir AF (DESCOVY) 200-25 MG tablet, Take 1 tablet by mouth daily., Disp: 90 tablet, Rfl: 3 .  feeding supplement, ENSURE ENLIVE, (ENSURE ENLIVE) LIQD, Take 237 mLs by mouth 2 (two) times daily between meals., Disp: 30 Bottle, Rfl: 12 .  ferrous sulfate 325 (65 FE) MG tablet, Take 1 tablet (325 mg total) by mouth daily with breakfast., Disp: 30 tablet, Rfl: 0 .  fluconazole (DIFLUCAN) 100 MG tablet, Take 1 tablet (100 mg total) by mouth once a week. On thursdays, Disp: 4 tablet, Rfl: 0 .  Menthol-Methyl Salicylate (MUSCLE RUB) 10-15 % CREA, Apply 1 application topically 2 (two) times daily. To neck and shoulders, Disp: 113 g, Rfl: 0 .  Multiple Vitamin (MULTIVITAMIN WITH MINERALS) TABS tablet, Take 1 tablet by mouth daily.,  Disp: 30 tablet, Rfl: 0 .  nitrofurantoin, macrocrystal-monohydrate, (MACROBID) 100 MG capsule, Take 1 capsule (100 mg total) by mouth every 12 (twelve) hours., Disp: 12 capsule, Rfl: 0 .  potassium chloride SA (K-DUR,KLOR-CON) 20 MEQ tablet, Take 1 tablet (20 mEq total) by mouth 2 (two) times daily., Disp: 60 tablet, Rfl: 0 .  vitamin B-12 (CYANOCOBALAMIN) 100 MCG tablet, Take 1 tablet (100 mcg total) by mouth daily., Disp: 30 tablet, Rfl: 0 .  Vitamin D, Ergocalciferol, (DRISDOL) 50000 units CAPS capsule, Take 1 capsule (50,000 Units total) by mouth every Friday., Disp: 4 capsule, Rfl: 0   Review of Systems  Constitutional:  Negative for chills, diaphoresis and fever.  HENT: Negative for congestion, hearing loss, sore throat and tinnitus.   Respiratory: Negative for cough, shortness of breath and wheezing.   Cardiovascular: Negative for chest pain, palpitations and leg swelling.  Gastrointestinal: Negative for abdominal pain, blood in stool, constipation, diarrhea, nausea and vomiting.  Genitourinary: Negative for dysuria, flank pain and hematuria.  Musculoskeletal: Negative for back pain and myalgias.  Skin: Negative for rash.  Neurological: Negative for dizziness, weakness and headaches.  Hematological: Does not bruise/bleed easily.  Psychiatric/Behavioral: Negative for suicidal ideas. The patient is not nervous/anxious.        Objective:   Physical Exam  Constitutional: She is oriented to person, place, and time. No distress.  HENT:  Head: Normocephalic and atraumatic.  Mouth/Throat: No oropharyngeal exudate.  Eyes: Conjunctivae and EOM are normal. No scleral icterus.  Neck: Normal range of motion. Neck supple.  Cardiovascular: Normal rate, regular rhythm and normal heart sounds.  Exam reveals no gallop and no friction rub.   No murmur heard. Pulmonary/Chest: Effort normal. No respiratory distress. She has no wheezes. She has no rales. She exhibits no tenderness.  Abdominal: She exhibits no distension.  Musculoskeletal: She exhibits no edema or tenderness.  Neurological: She is alert and oriented to person, place, and time. She exhibits normal muscle tone. Coordination normal.  Skin: Skin is warm and dry. No rash noted. She is not diaphoretic. No erythema. No pallor.  Psychiatric: She has a normal mood and affect. Her behavior is normal. Judgment normal. Her speech is delayed.          Assessment & Plan:    HIV/AIDS: recheck VL and CD4 today. We will switch up to having her meds sent from [facility] to her home. We will ensure she has copay cards so that she has no copay at  all  She will need to continue her dapsone and fluconazole when necessary  CMV esophagitis: resolved  Thrush: on fluconazole again  Hypokalemia: I wonder if this is an issue anymore will recheck her BMP  ACD and Iron deficiency anemia: she is to take her iron SEPARATELY from her Tivicay  Leukopenia/neutropenia: likely due to her HIV itself  We spent greater than 40 minutes with the patient including greater than 50% of time in face to face counsel of the patient and her husband re her HIV/AIDS, CMV esophagitis, thrush, hypokalemia, IDA, leukopenia and in coordination of her care.

## 2016-01-04 LAB — CBC WITH DIFFERENTIAL/PLATELET
BASOS ABS: 63 {cells}/uL (ref 0–200)
BASOS PCT: 1 %
EOS PCT: 4 %
Eosinophils Absolute: 252 cells/uL (ref 15–500)
HCT: 33.5 % — ABNORMAL LOW (ref 35.0–45.0)
HEMOGLOBIN: 11 g/dL — AB (ref 11.7–15.5)
LYMPHS ABS: 1953 {cells}/uL (ref 850–3900)
Lymphocytes Relative: 31 %
MCH: 32 pg (ref 27.0–33.0)
MCHC: 32.8 g/dL (ref 32.0–36.0)
MCV: 97.4 fL (ref 80.0–100.0)
MONOS PCT: 14 %
MPV: 9.5 fL (ref 7.5–12.5)
Monocytes Absolute: 882 cells/uL (ref 200–950)
NEUTROS ABS: 3150 {cells}/uL (ref 1500–7800)
Neutrophils Relative %: 50 %
PLATELETS: 432 10*3/uL — AB (ref 140–400)
RBC: 3.44 MIL/uL — AB (ref 3.80–5.10)
RDW: 17.2 % — ABNORMAL HIGH (ref 11.0–15.0)
WBC: 6.3 10*3/uL (ref 3.8–10.8)

## 2016-01-04 LAB — COMPLETE METABOLIC PANEL WITH GFR
ALBUMIN: 3.1 g/dL — AB (ref 3.6–5.1)
ALK PHOS: 102 U/L (ref 33–130)
ALT: 22 U/L (ref 6–29)
AST: 44 U/L — ABNORMAL HIGH (ref 10–35)
BILIRUBIN TOTAL: 0.3 mg/dL (ref 0.2–1.2)
BUN: 18 mg/dL (ref 7–25)
CO2: 23 mmol/L (ref 20–31)
Calcium: 9 mg/dL (ref 8.6–10.4)
Chloride: 99 mmol/L (ref 98–110)
Creat: 0.69 mg/dL (ref 0.50–1.05)
GLUCOSE: 97 mg/dL (ref 65–99)
POTASSIUM: 5.3 mmol/L (ref 3.5–5.3)
SODIUM: 131 mmol/L — AB (ref 135–146)
TOTAL PROTEIN: 8.7 g/dL — AB (ref 6.1–8.1)

## 2016-01-04 MED FILL — DESCOVY 200-25 MG TABS: 200-25 | 30 days supply | Qty: 30 | Fill #0

## 2016-01-04 MED FILL — TIVICAY 50 MG TABLET: 50 | 30 days supply | Qty: 30 | Fill #0

## 2016-01-04 MED FILL — DAPSONE 100 MG TABLET: 100 | 30 days supply | Qty: 30 | Fill #0

## 2016-01-05 ENCOUNTER — Encounter: Payer: Self-pay | Admitting: Physical Medicine & Rehabilitation

## 2016-01-05 ENCOUNTER — Encounter
Payer: Managed Care, Other (non HMO) | Attending: Physical Medicine & Rehabilitation | Admitting: Physical Medicine & Rehabilitation

## 2016-01-05 VITALS — BP 146/84 | HR 108 | Resp 14

## 2016-01-05 DIAGNOSIS — B3781 Candidal esophagitis: Secondary | ICD-10-CM

## 2016-01-05 DIAGNOSIS — E559 Vitamin D deficiency, unspecified: Secondary | ICD-10-CM | POA: Diagnosis not present

## 2016-01-05 DIAGNOSIS — K208 Other esophagitis: Secondary | ICD-10-CM

## 2016-01-05 DIAGNOSIS — L89159 Pressure ulcer of sacral region, unspecified stage: Secondary | ICD-10-CM | POA: Diagnosis not present

## 2016-01-05 DIAGNOSIS — IMO0001 Reserved for inherently not codable concepts without codable children: Secondary | ICD-10-CM

## 2016-01-05 DIAGNOSIS — B259 Cytomegaloviral disease, unspecified: Secondary | ICD-10-CM

## 2016-01-05 DIAGNOSIS — B258 Other cytomegaloviral diseases: Secondary | ICD-10-CM

## 2016-01-05 DIAGNOSIS — B379 Candidiasis, unspecified: Secondary | ICD-10-CM | POA: Diagnosis not present

## 2016-01-05 DIAGNOSIS — B20 Human immunodeficiency virus [HIV] disease: Secondary | ICD-10-CM | POA: Insufficient documentation

## 2016-01-05 DIAGNOSIS — S63289S Dislocation of proximal interphalangeal joint of unspecified finger, sequela: Secondary | ICD-10-CM

## 2016-01-05 DIAGNOSIS — Z5189 Encounter for other specified aftercare: Secondary | ICD-10-CM | POA: Insufficient documentation

## 2016-01-05 DIAGNOSIS — D649 Anemia, unspecified: Secondary | ICD-10-CM | POA: Insufficient documentation

## 2016-01-05 DIAGNOSIS — J189 Pneumonia, unspecified organism: Secondary | ICD-10-CM | POA: Insufficient documentation

## 2016-01-05 LAB — T-HELPER CELL (CD4) - (RCID CLINIC ONLY)
CD4 % Helper T Cell: 8 % — ABNORMAL LOW (ref 33–55)
CD4 T Cell Abs: 140 /uL — ABNORMAL LOW (ref 400–2700)

## 2016-01-05 NOTE — Progress Notes (Signed)
Subjective:    Patient ID: Samantha Kline, female    DOB: 1961/03/04, 54 y.o.   MRN: [id number]  HPI 55 y.o. with history of HIV presents for hospital follow up after receiving CIR for multiple medical issues.  Since discharge, she states her sacral ulcers have healed.  Her appetite has improved on a regular diet without problems, no trouble swallowing.  She had an appointment with ID.  She saw her PCP, who ordered labs.  She denies orthostatic symptoms.  Her finger has improved and splint d/ced per Ortho.  Overall, she states doing well.  DME: None required Mobility:Walker Therapies: Discharged  Pain Inventory Average Pain 0 Pain Right Now 0 My pain is other  In the last 24 hours, has pain interfered with the following? General activity 0 Relation with others 0 Enjoyment of life 0 What TIME of day is your pain at its worst? no pain Sleep (in general) Good  Pain is worse with: no pain Pain improves with: no pain Relief from Meds: no pain  Mobility walk with assistance use a walker how many minutes can you walk? 10-15 ability to climb steps?  yes do you drive?  no  Function not employed: date last employed . I need assistance with the following:  household duties  Neuro/Psych No problems in this area  Prior Studies hospital follow up  Physicians involved in your care hospital follow up   Family History  Problem Relation Age of Onset  . Diabetes Father   . Heart attack Brother   . Hypertension Sister    Social History   Social History  . Marital status: Married    Spouse name: N/A  . Number of children: N/A  . Years of education: N/A   Social History Main Topics  . Smoking status: Never Smoker  . Smokeless tobacco: Never Used  . Alcohol use Yes     Comment: 10/24/2015 "nothing since the 1980s"  . Drug use: No  . Sexual activity: Not Asked   Other Topics Concern  . None   Social History Narrative  . None   Past Surgical History:  Procedure  Laterality Date  . CLOSED REDUCTION FINGER WITH PERCUTANEOUS PINNING Left 12/18/2015   Procedure: CLOSED VS. OPEN REDUCTION OF LEFT LONG FINGER;  Surgeon: Dairl Ponder, MD;  Location: [facility] OR;  Service: Orthopedics;  Laterality: Left;  . ESOPHAGOGASTRODUODENOSCOPY N/A 11/23/2015   Procedure: ESOPHAGOGASTRODUODENOSCOPY (EGD);  Surgeon: Vida Rigger, MD;  [facility];  Service: Endoscopy;  Laterality: N/A;  . TUBAL LIGATION  1980s   Past Medical History:  Diagnosis Date  . Anemia   . HIV (human immunodeficiency virus infection) (HCC)    11/06/15 Family states it was just diagnosed this week  . Pneumonia 10/24/2015  . Vitamin D deficiency    BP (!) 146/84 (BP Location: Right Arm, Patient Position: Sitting, Cuff Size: Normal)   Pulse (!) 108   Resp 14   SpO2 96%   Opioid Risk Score:   Fall Risk Score:  `1  Depression screen PHQ 2/9  Depression screen PHQ 2/9 01/05/2016  Decreased Interest 0  Down, Depressed, Hopeless 0  PHQ - 2 Score 0  Altered sleeping 0  Tired, decreased energy 1  Change in appetite 0  Feeling bad or failure about yourself  0  Trouble concentrating 0  Moving slowly or fidgety/restless 0  Suicidal thoughts 0  PHQ-9 Score 1  Difficult doing work/chores Not difficult at all    Review of Systems  Constitutional: Positive for unexpected weight change.  Eyes: Negative.   Respiratory: Negative.   Cardiovascular: Negative.   Gastrointestinal: Positive for nausea.  Endocrine: Negative.   Genitourinary: Negative.   Musculoskeletal: Positive for gait problem.  Skin: Negative.   Hematological: Negative.   Psychiatric/Behavioral: Negative.   All other systems reviewed and are negative.      Objective:   Physical Exam  Constitutional: Vital signs are normal. NAD.   HENT: Normocephalic and atraumatic.  Eyes: Conjunctivae and EOM are normal.  Cardiovascular: Regular rate and rhythm. No murmur heard. Respiratory: Effort normal and breath sounds normal.  No stridor. No respiratory distress. She has no wheezes.  GI: Soft. Bowel sounds are normal. She exhibits no distension. There is no tenderness.  Musculoskeletal:  Mild edema and tenderness of 3rd digit LUE.   Neurological:  A&O X3. Motor:  B/l UE: 5/5 deltoid, biceps, triceps, wrist and HI (improving)  B/l LE: -5/5 HF, 5/5 KE/KF and 5/5 ADF/PF  Skin: Skin is warm and dry. No rash noted. No erythema.  Psychiatric: Her affect is normal. Cognition and memory appear normal    Assessment & Plan:  55 y.o. with history of HIV presents for hospital follow up after receiving CIR for multiple medical issues.    1. HIV   Cont to follow up with ID, PCP  Cont meds  Discharged from therapies  2.  Sacral decubitus ulcers  Healed per pt   3.  HIV/AIDS with MAC?  Cont follow up with ID  4. Candida/CMV esophagitis:   Fluconazole restarted on 8/24 per ID, weekly  5. Left 3rd digit PIP dorsal dislocation  Appropriately healing  Splint d/ced  Follow up prn   Meds reviewed Referral reviewed All questions answered

## 2016-01-07 LAB — HIV RNA, RTPCR W/R GT (RTI, PI,INT)
HIV 1 RNA Quant: 81 copies/mL — ABNORMAL HIGH
HIV-1 RNA QUANT, LOG: 1.91 {Log_copies}/mL — AB

## 2016-01-09 ENCOUNTER — Telehealth: Payer: Self-pay | Admitting: *Deleted

## 2016-01-09 NOTE — Telephone Encounter (Signed)
RN received a call from Ms. Manson PasseyBrown stating she was wondering if I could help her with finding someone that would help complete a application for disability.   RN advised Ms. Manson PasseyBrown that I would be happy to help and asked her if I could follow up with her on Monday with this information. Ms. Manson PasseyBrown agreed to that date and thanked me for my time

## 2016-01-09 NOTE — Telephone Encounter (Signed)
RN conferenced with Amy Reed/[facility] about different organizations that are willing with assist with completing Disability applications. Amy stated the [facility] is willing to assist and instructed me on how to complete a online referral for their services  RN followed up with Ms. Manson PasseyBrown this morning and got her permission to complete a referral. Together over the phone we completed the needed referral documentation.  Referral has been sent to the [facility] for Ms. Manson PasseyBrown

## 2016-01-11 ENCOUNTER — Telehealth: Payer: Self-pay | Admitting: *Deleted

## 2016-01-11 NOTE — Telephone Encounter (Signed)
On Friday(01/06/16) RN received a call from Ms. Manson PasseyBrown requesting assistance with completing a application for disability.   On Monday(01/09/16) RN completed a referral to the [facility] for the patient. On that email, which included the referral, RN also stated "Good Morning!  I am,  Lind CovertAmbre Cassia Fein, a community based RN/Case Production designer, theatre/television/filmManager for the [facility]. Today I spoke with Amy Reed/[facility] who stated you may be able to help on of our patients.   This lady has been sick for years without knowing the source of her illness. After several years of complications (since 2013) she has just found the source of her problem but the disease has done a lot of damage and limits her ability to hold down employment.   She would love to sit down and learn what her options may be for getting Disability services.   Thank you so much for your help!  RN received a reply on 01/10/16 stating "We have reached out to Ms. Manson PasseyBrown and currently plan to meet with her on Monday, provided she can arrange transportation.  Thank you,  Esmond PlantsLinda Bedell Administrative Assistant [phone]; [email or web address] [facility] Disability Assistance Team

## 2016-01-17 NOTE — Progress Notes (Signed)
RN made a conjoined visit with the patient with Dr Daiva EvesVan Dam, Pharmacy and the patient's husband. Together we discussed the best plan for caring for Samantha Kline. During this intergrated meeting I explained to the patient and her husband my services. At this time the patient would like to have my card on hand and call for any needs or services. RN agreed and provided the patient and her husband with my contact information

## 2016-01-17 NOTE — Patient Instructions (Signed)
Please refer to progress note for details of today's interaction

## 2016-01-25 ENCOUNTER — Encounter: Payer: Self-pay | Admitting: Licensed Clinical Social Worker

## 2016-02-02 MED FILL — TIVICAY 50 MG TABLET: 50 | 30 days supply | Qty: 30 | Fill #1

## 2016-02-02 MED FILL — DESCOVY 200-25 MG TABS: 200-25 | 30 days supply | Qty: 30 | Fill #1

## 2016-02-02 MED FILL — DAPSONE 100 MG TABLET: 100 | 30 days supply | Qty: 30 | Fill #1

## 2016-02-21 ENCOUNTER — Encounter: Payer: Self-pay | Admitting: Infectious Disease

## 2016-02-21 ENCOUNTER — Ambulatory Visit (INDEPENDENT_AMBULATORY_CARE_PROVIDER_SITE_OTHER): Payer: Managed Care, Other (non HMO) | Admitting: Infectious Disease

## 2016-02-21 VITALS — BP 132/78 | HR 108 | Temp 97.6°F | Wt 131.0 lb

## 2016-02-21 DIAGNOSIS — K208 Other esophagitis without bleeding: Secondary | ICD-10-CM

## 2016-02-21 DIAGNOSIS — B259 Cytomegaloviral disease, unspecified: Secondary | ICD-10-CM

## 2016-02-21 DIAGNOSIS — B20 Human immunodeficiency virus [HIV] disease: Secondary | ICD-10-CM

## 2016-02-21 DIAGNOSIS — R21 Rash and other nonspecific skin eruption: Secondary | ICD-10-CM

## 2016-02-21 DIAGNOSIS — B370 Candidal stomatitis: Secondary | ICD-10-CM

## 2016-02-21 DIAGNOSIS — B59 Pneumocystosis: Secondary | ICD-10-CM | POA: Diagnosis not present

## 2016-02-21 DIAGNOSIS — B258 Other cytomegaloviral diseases: Secondary | ICD-10-CM

## 2016-02-21 DIAGNOSIS — B3781 Candidal esophagitis: Secondary | ICD-10-CM

## 2016-02-21 HISTORY — DX: Rash and other nonspecific skin eruption: R21

## 2016-02-21 LAB — CBC WITH DIFFERENTIAL/PLATELET
BASOS ABS: 0 {cells}/uL (ref 0–200)
Basophils Relative: 0 %
EOS ABS: 310 {cells}/uL (ref 15–500)
Eosinophils Relative: 5 %
HEMATOCRIT: 32.7 % — AB (ref 35.0–45.0)
Hemoglobin: 10.9 g/dL — ABNORMAL LOW (ref 11.7–15.5)
LYMPHS PCT: 38 %
Lymphs Abs: 2356 cells/uL (ref 850–3900)
MCH: 31.1 pg (ref 27.0–33.0)
MCHC: 33.3 g/dL (ref 32.0–36.0)
MCV: 93.4 fL (ref 80.0–100.0)
MONO ABS: 992 {cells}/uL — AB (ref 200–950)
MPV: 9.8 fL (ref 7.5–12.5)
Monocytes Relative: 16 %
NEUTROS PCT: 41 %
Neutro Abs: 2542 cells/uL (ref 1500–7800)
Platelets: 277 10*3/uL (ref 140–400)
RBC: 3.5 MIL/uL — AB (ref 3.80–5.10)
RDW: 13.2 % (ref 11.0–15.0)
WBC: 6.2 10*3/uL (ref 3.8–10.8)

## 2016-02-21 MED ORDER — TRIAMCINOLONE ACETONIDE 0.5 % EX OINT: 1.0000 "application " | TOPICAL_OINTMENT | Freq: Two times a day (BID) | CUTANEOUS | 0 refills | Status: DC

## 2016-02-21 NOTE — Progress Notes (Signed)
Subjective:   Chief complaint:  Recent problems with nausea and vomiting last week that improved with Phenergan also rash on face and chest     Patient ID: Samantha Kline, female    DOB: 1960-05-31, 55 y.o.   MRN: [id number]  HPI  [age] year old admitted with PCP pneumonia, newly diagnosed HIV/AIDS and found to have candida esophagitis, fevers, then CMV esophagitis.   She had protracted stay and eventually went to inpatient rehab.   She completed 21 days of GCV for her CMV esophagitis supported by CSF for part of her therapy. She did continue to have neutropenia, leukopenia after coming off the GCV and her G-CSF.    Her virus responded very nicely to Tivicay and Descovy  I in September and she was much improved her viral load was not perfectly suppressed but was at 80 copies and her CD4 count had risen. Since then she has been doing well except for last week when she had some nausea and vomiting 4 times. She had been given Phenergan which helped with the nausea but she has had some sleepiness with it. She also has noticed a rash on her chest and face that is itchy but does respond to over-the-counter topical corticosteroids.  Lab Results  Component Value Date   HIV1RNAQUANT 81 (H) 01/03/2016   HIV1RNAQUANT 40 11/30/2015   HIV1RNAQUANT 200 11/16/2015   Lab Results  Component Value Date   CD4TABS 140 (L) 01/03/2016   CD4TABS 20 (L) 11/16/2015      Past Medical History:  Diagnosis Date  . Anemia   . HIV (human immunodeficiency virus infection) (HCC)    11/06/15 Family states it was just diagnosed this week  . Pneumonia 10/24/2015  . Vitamin D deficiency     Past Surgical History:  Procedure Laterality Date  . CLOSED REDUCTION FINGER WITH PERCUTANEOUS PINNING Left 12/18/2015   Procedure: CLOSED VS. OPEN REDUCTION OF LEFT LONG FINGER;  Surgeon: Dairl PonderMatthew Weingold, MD;  Location: [facility] OR;  Service: Orthopedics;  Laterality: Left;  . ESOPHAGOGASTRODUODENOSCOPY N/A 11/23/2015   Procedure: ESOPHAGOGASTRODUODENOSCOPY (EGD);  Surgeon: Vida RiggerMarc Magod, MD;  [facility];  Service: Endoscopy;  Laterality: N/A;  . TUBAL LIGATION  1980s    Family History  Problem Relation Age of Onset  . Diabetes Father   . Heart attack Brother   . Hypertension Sister       Social History   Social History  . Marital status: Married    Spouse name: N/A  . Number of children: N/A  . Years of education: N/A   Social History Main Topics  . Smoking status: Never Smoker  . Smokeless tobacco: Never Used  . Alcohol use Yes     Comment: 10/24/2015 "nothing since the 1980s"  . Drug use: No  . Sexual activity: Not on file   Other Topics Concern  . Not on file   Social History Narrative  . No narrative on file    Allergies  Allergen Reactions  . Septra [Sulfamethoxazole-Trimethoprim]     Possible cause of hyponatremia     Current Outpatient Prescriptions:  .  dapsone 100 MG tablet, Take 1 tablet (100 mg total) by mouth daily., Disp: 90 tablet, Rfl: 3 .  dimethicone 1 % cream, Apply topically 3 (three) times daily after meals., Disp: 113 g, Rfl: 0 .  dolutegravir (TIVICAY) 50 MG tablet, Take 1 tablet (50 mg total) by mouth daily., Disp: 90 tablet, Rfl: 3 .  emtricitabine-tenofovir AF (DESCOVY) 200-25 MG tablet,  Take 1 tablet by mouth daily., Disp: 90 tablet, Rfl: 3 .  feeding supplement, ENSURE ENLIVE, (ENSURE ENLIVE) LIQD, Take 237 mLs by mouth 2 (two) times daily between meals., Disp: 30 Bottle, Rfl: 12 .  ferrous sulfate 325 (65 FE) MG tablet, Take 1 tablet (325 mg total) by mouth daily with breakfast., Disp: 30 tablet, Rfl: 0 .  fluconazole (DIFLUCAN) 100 MG tablet, Take 1 tablet (100 mg total) by mouth once a week. On thursdays, Disp: 4 tablet, Rfl: 0 .  Menthol-Methyl Salicylate (MUSCLE RUB) 10-15 % CREA, Apply 1 application topically 2 (two) times daily. To neck and shoulders, Disp: 113 g, Rfl: 0 .  Multiple Vitamin (MULTIVITAMIN WITH MINERALS) TABS tablet, Take 1  tablet by mouth daily., Disp: 30 tablet, Rfl: 0 .  nitrofurantoin, macrocrystal-monohydrate, (MACROBID) 100 MG capsule, Take 1 capsule (100 mg total) by mouth every 12 (twelve) hours., Disp: 12 capsule, Rfl: 0 .  potassium chloride SA (K-DUR,KLOR-CON) 20 MEQ tablet, Take 1 tablet (20 mEq total) by mouth 2 (two) times daily., Disp: 60 tablet, Rfl: 0 .  vitamin B-12 (CYANOCOBALAMIN) 100 MCG tablet, Take 1 tablet (100 mcg total) by mouth daily., Disp: 30 tablet, Rfl: 0 .  Vitamin D, Ergocalciferol, (DRISDOL) 50000 units CAPS capsule, Take 1 capsule (50,000 Units total) by mouth every Friday., Disp: 4 capsule, Rfl: 0   Review of Systems  Constitutional: Negative for chills, diaphoresis and fever.  HENT: Negative for congestion, hearing loss, sore throat and tinnitus.   Respiratory: Negative for cough, shortness of breath and wheezing.   Cardiovascular: Negative for chest pain, palpitations and leg swelling.  Gastrointestinal: Positive for nausea and vomiting. Negative for abdominal pain, blood in stool, constipation and diarrhea.  Genitourinary: Negative for dysuria, flank pain and hematuria.  Musculoskeletal: Negative for back pain and myalgias.  Skin: Negative for rash.  Neurological: Negative for dizziness, weakness and headaches.  Hematological: Does not bruise/bleed easily.  Psychiatric/Behavioral: Negative for suicidal ideas. The patient is not nervous/anxious.        Objective:   Physical Exam  Constitutional: She is oriented to person, place, and time. No distress.  HENT:  Head: Normocephalic and atraumatic.  Mouth/Throat: Uvula is midline and oropharynx is clear and moist. No oropharyngeal exudate.  Eyes: Conjunctivae and EOM are normal. No scleral icterus.  Neck: Normal range of motion. Neck supple.  Cardiovascular: Normal rate, regular rhythm and normal heart sounds.  Exam reveals no gallop and no friction rub.   No murmur heard. Pulmonary/Chest: Effort normal. No respiratory  distress. She has no wheezes.     She exhibits no tenderness.  Abdominal: Soft. Bowel sounds are normal. She exhibits no distension. There is no tenderness.  Musculoskeletal: She exhibits no edema or tenderness.  Neurological: She is alert and oriented to person, place, and time. She exhibits normal muscle tone. Coordination normal.  Skin: Skin is warm and dry. No rash noted. She is not diaphoretic. No erythema. No pallor.  Psychiatric: She has a normal mood and affect. Her behavior is normal. Judgment normal. Her speech is delayed.     Rash 02/21/2016:         Assessment & Plan:    HIV/AIDS: recheck VL and CD4 today. [facility] to her home. She does also need to continue dapsone for long as her CD4 count has not been brought above 200 for 3 consecutive months  CMV esophagitis: resolved  Thrush: Resolved  Nausea and vomiting: Not clear  what the cause of this was bleed monitor closely certainly her enteral should not be playing much of a role  Rash: Maybe a little bit of immune reconstitution syndrome will give her a more potent topical steroid  We spent greater than 25 minutes with the patient including greater than 50% of time in face to face counsel of the patient and her husband re her HIV/AIDS, CMV esophagitis, thrush, rash, nausea and vomiting and in coordination of her care.

## 2016-02-21 NOTE — Patient Instructions (Signed)
Labs today Flu shot today  Appt with PHarmacy in 2 weeks and Dr. Daiva EvesVan Dam in one month

## 2016-02-22 LAB — COMPLETE METABOLIC PANEL WITH GFR
ALBUMIN: 3.5 g/dL — AB (ref 3.6–5.1)
ALK PHOS: 59 U/L (ref 33–130)
ALT: 12 U/L (ref 6–29)
AST: 23 U/L (ref 10–35)
BILIRUBIN TOTAL: 0.4 mg/dL (ref 0.2–1.2)
BUN: 16 mg/dL (ref 7–25)
CALCIUM: 11.5 mg/dL — AB (ref 8.6–10.4)
CO2: 30 mmol/L (ref 20–31)
CREATININE: 1.15 mg/dL — AB (ref 0.50–1.05)
Chloride: 101 mmol/L (ref 98–110)
GFR, EST AFRICAN AMERICAN: 61 mL/min (ref >=60)
GFR, EST NON AFRICAN AMERICAN: 53 mL/min — AB (ref >=60)
Glucose, Bld: 102 mg/dL — ABNORMAL HIGH (ref 65–99)
Potassium: 4.3 mmol/L (ref 3.5–5.3)
Sodium: 136 mmol/L (ref 135–146)
TOTAL PROTEIN: 8.6 g/dL — AB (ref 6.1–8.1)

## 2016-02-22 LAB — RPR

## 2016-02-23 LAB — T-HELPER CELL (CD4) - (RCID CLINIC ONLY)
CD4 % Helper T Cell: 9 % — ABNORMAL LOW (ref 33–55)
CD4 T Cell Abs: 210 /uL — ABNORMAL LOW (ref 400–2700)

## 2016-02-23 LAB — URINE CYTOLOGY ANCILLARY ONLY
Chlamydia: NEGATIVE
Neisseria Gonorrhea: NEGATIVE

## 2016-02-27 MED FILL — DESCOVY 200-25 MG TABS: 200-25 | 30 days supply | Qty: 30 | Fill #2

## 2016-02-27 MED FILL — TIVICAY 50 MG TABLET: 50 | 30 days supply | Qty: 30 | Fill #2

## 2016-02-27 MED FILL — DAPSONE 100 MG TABLET: 100 | 30 days supply | Qty: 30 | Fill #2

## 2016-03-03 ENCOUNTER — Encounter (HOSPITAL_COMMUNITY): Payer: Self-pay | Admitting: Orthopedic Surgery

## 2016-03-03 NOTE — OR Nursing (Signed)
LATE ENTRY: Corrected error in procedure start time and procedure performed.

## 2016-03-06 ENCOUNTER — Ambulatory Visit (INDEPENDENT_AMBULATORY_CARE_PROVIDER_SITE_OTHER): Payer: Managed Care, Other (non HMO) | Admitting: Pharmacist Clinician (PhC)/ Clinical Pharmacy Specialist

## 2016-03-06 ENCOUNTER — Ambulatory Visit (INDEPENDENT_AMBULATORY_CARE_PROVIDER_SITE_OTHER): Payer: Managed Care, Other (non HMO)

## 2016-03-06 DIAGNOSIS — B20 Human immunodeficiency virus [HIV] disease: Secondary | ICD-10-CM | POA: Diagnosis not present

## 2016-03-06 DIAGNOSIS — Z23 Encounter for immunization: Secondary | ICD-10-CM

## 2016-03-06 LAB — BASIC METABOLIC PANEL
BUN: 18 mg/dL (ref 7–25)
CHLORIDE: 101 mmol/L (ref 98–110)
CO2: 26 mmol/L (ref 20–31)
Calcium: 11.1 mg/dL — ABNORMAL HIGH (ref 8.6–10.4)
Creat: 0.99 mg/dL (ref 0.50–1.05)
GLUCOSE: 87 mg/dL (ref 65–99)
POTASSIUM: 4.2 mmol/L (ref 3.5–5.3)
SODIUM: 134 mmol/L — AB (ref 135–146)

## 2016-03-06 NOTE — Patient Instructions (Signed)
Continue taking your Tivicay and Descovy everyday. We will check labs today and you will see Dr. Daiva EvesVan Dam at your next appointment.

## 2016-03-06 NOTE — Progress Notes (Signed)
HPI: Lorna FewHythia Alcott is a 55 y.o. female who presents for HIV follow up.  Allergies: Allergies  Allergen Reactions  . Septra [Sulfamethoxazole-Trimethoprim]     Possible cause of hyponatremia    Vitals:    Past Medical History: Past Medical History:  Diagnosis Date  . Anemia   . HIV (human immunodeficiency virus infection) (HCC)    11/06/15 Family states it was just diagnosed this week  . Pneumonia 10/24/2015  . Rash and nonspecific skin eruption 02/21/2016  . Vitamin D deficiency     Social History: Social History   Social History  . Marital status: Married    Spouse name: N/A  . Number of children: N/A  . Years of education: N/A   Social History Main Topics  . Smoking status: Never Smoker  . Smokeless tobacco: Never Used  . Alcohol use Yes     Comment: 10/24/2015 "nothing since the 1980s"  . Drug use: No  . Sexual activity: Not on file   Other Topics Concern  . Not on file   Social History Narrative  . No narrative on file    Current Regimen: Tivicay/Descovy  Labs: HIV 1 RNA Quant (copies/mL)  Date Value  01/03/2016 81 (H)  11/30/2015 40  11/16/2015 200   CD4 T Cell Abs (/uL)  Date Value  02/21/2016 210 (L)  01/03/2016 140 (L)  11/16/2015 20 (L)   Hepatitis B Surface Ag (no units)  Date Value  10/27/2015 Negative   HCV Ab (s/co ratio)  Date Value  10/27/2015 0.2    CrCl: Estimated Creatinine Clearance: 47.2 mL/min (by C-G formula based on SCr of 1.15 mg/dL (H)).  Lipids:    Component Value Date/Time   CHOL 130 11/19/2015 1306   TRIG 250 (H) 11/19/2015 1306   HDL <10 (L) 11/19/2015 1306   CHOLHDL NOT CALCULATED 11/19/2015 1306   VLDL 50 (H) 11/19/2015 1306   LDLCALC NOT CALCULATED 11/19/2015 1306    Assessment: Ms. Manson PasseyBrown is a 55 year old female who presents for HIV follow up. She brought all of her medications today and her medication list was appropriately updated. Her HIV regimen consists of Tivicay/Descovy with Dapsone and Fluconazole  for OI prophylaxis. She reports not missing any doses since she was discharged from the hospital. She confirmed that [facility] for [facility] medications and gets them shipped to her home without any problems.  Her recent kidney function has increased from September. Her creatinine on 9/12 was 0.69 and on 10/31 it was 1.15. We will check a BMET today to see if her renal function is worsening. Her HIV regimen should be fine for now but we will keep a close watch on this.  Recommendations: Continue Tivicay/Descovy Continue dapsone and fluconazole Check HIV viral load, CD4 count and BMET Follow up appointment scheduled with Dr. Daiva EvesVan Dam for 12/4  Casilda Carlsaylor Elizebeth Kluesner, PharmD. PGY-2 Infectious Diseases Pharmacy Resident Pager: [phone] 03/06/2016, 3:35 PM

## 2016-03-07 LAB — T-HELPER CELL (CD4) - (RCID CLINIC ONLY)
CD4 T CELL HELPER: 7 % — AB (ref 33–55)
CD4 T Cell Abs: 190 /uL — ABNORMAL LOW (ref 400–2700)

## 2016-03-07 LAB — HIV RNA, RTPCR W/R GT (RTI, PI,INT)
HIV 1 RNA Quant: <20 copies /mL
HIV-1 RNA Quant, Log: <1:30 {titer}

## 2016-03-08 NOTE — Progress Notes (Signed)
Agreed with Taylor's note.  

## 2016-03-09 LAB — HIV-1 RNA QUANT-NO REFLEX-BLD
HIV 1 RNA Quant: <20 copies/mL (ref <20)
HIV-1 RNA Quant, Log: <1.3 Log copies/mL (ref <1.30)

## 2016-03-26 ENCOUNTER — Ambulatory Visit (INDEPENDENT_AMBULATORY_CARE_PROVIDER_SITE_OTHER): Payer: Managed Care, Other (non HMO) | Admitting: Infectious Disease

## 2016-03-26 ENCOUNTER — Encounter: Payer: Self-pay | Admitting: Infectious Disease

## 2016-03-26 VITALS — BP 136/84 | HR 92 | Temp 98.3°F | Ht 63.0 in | Wt 135.0 lb

## 2016-03-26 DIAGNOSIS — B20 Human immunodeficiency virus [HIV] disease: Secondary | ICD-10-CM

## 2016-03-26 DIAGNOSIS — B370 Candidal stomatitis: Secondary | ICD-10-CM | POA: Diagnosis not present

## 2016-03-26 DIAGNOSIS — B3781 Candidal esophagitis: Secondary | ICD-10-CM | POA: Diagnosis not present

## 2016-03-26 DIAGNOSIS — IMO0001 Reserved for inherently not codable concepts without codable children: Secondary | ICD-10-CM

## 2016-03-26 DIAGNOSIS — G9349 Other encephalopathy: Secondary | ICD-10-CM

## 2016-03-26 DIAGNOSIS — B59 Pneumocystosis: Secondary | ICD-10-CM

## 2016-03-26 DIAGNOSIS — D638 Anemia in other chronic diseases classified elsewhere: Secondary | ICD-10-CM

## 2016-03-26 DIAGNOSIS — G9340 Encephalopathy, unspecified: Secondary | ICD-10-CM

## 2016-03-26 DIAGNOSIS — S63289S Dislocation of proximal interphalangeal joint of unspecified finger, sequela: Secondary | ICD-10-CM

## 2016-03-26 NOTE — Progress Notes (Signed)
Subjective:   Chief complaint:All of her HIV she states that she feels the best and she has in 2 years     Patient ID: Samantha Kline, female    DOB: 09/23/1960, 55 y.o.   MRN: [id number]  HPI  55 year old admitted with PCP pneumonia, newly diagnosed HIV/AIDS and found to have candida esophagitis, fevers, then CMV esophagitis.   She had protracted stay and eventually went to inpatient rehab.   She completed 21 days of GCV for her CMV esophagitis supported by CSF for part of her therapy. She did continue to have neutropenia, leukopenia after coming off the GCV and her G-CSF.    Her virus responded very nicely to Tivicay and Descovy  I in September and she was much improved her viral load was not perfectly suppressed but was at 80 copies and her CD4 count had risen.    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 Her rash has resolved and her dysphagia is much improved she was treated with fluconazole for thrush recently.  Lab Results  Component Value Date   HIV1RNAQUANT <20 03/06/2016   HIV1RNAQUANT <20 02/21/2016   HIV1RNAQUANT 81 (H) 01/03/2016    Lab Results  Component Value Date   CD4TABS 190 (L) 03/06/2016   CD4TABS 210 (L) 02/21/2016   CD4TABS 140 (L) 01/03/2016      Lab Results  Component Value Date   HIV1RNAQUANT <20 03/06/2016   HIV1RNAQUANT <20 02/21/2016   HIV1RNAQUANT 81 (H) 01/03/2016   Lab Results  Component Value Date   CD4TABS 190 (L) 03/06/2016   CD4TABS 210 (L) 02/21/2016   CD4TABS 140 (L) 01/03/2016      Past Medical History:  Diagnosis Date  . Anemia   . HIV (human immunodeficiency virus infection) (HCC)    11/06/15 Family states it was just diagnosed this week  . Pneumonia 10/24/2015  . Rash and nonspecific skin eruption 02/21/2016  . Vitamin D deficiency     Past Surgical History:  Procedure  Laterality Date  . CLOSED REDUCTION FINGER WITH PERCUTANEOUS PINNING Left 12/18/2015   Procedure: Closed reduction of Left long finger PIP dislocation;  Surgeon: Dairl PonderMatthew Weingold, MD;  [facility];  Service: Orthopedics;  Laterality: Left;  . ESOPHAGOGASTRODUODENOSCOPY N/A 11/23/2015   Procedure: ESOPHAGOGASTRODUODENOSCOPY (EGD);  Surgeon: Vida RiggerMarc Magod, MD;  [facility];  Service: Endoscopy;  Laterality: N/A;  . TUBAL LIGATION  1980s    Family History  Problem Relation Age of Onset  . Diabetes Father   . Heart attack Brother   . Hypertension Sister       Social History   Social History  . Marital status: Married    Spouse name: N/A  . Number of children: N/A  . Years of education: N/A   Social History Main Topics  . Smoking status: Never Smoker  . Smokeless tobacco: Never Used  . Alcohol use Yes     Comment: 10/24/2015 "nothing since the 1980s"  . Drug use: No  . Sexual activity: Not on file   Other Topics Concern  . Not on file   Social History Narrative  . No narrative on file    Allergies  Allergen Reactions  . Septra [Sulfamethoxazole-Trimethoprim]     Possible cause of hyponatremia     Current Outpatient Prescriptions:  .  dapsone 100 MG tablet,  Take 1 tablet (100 mg total) by mouth daily., Disp: 90 tablet, Rfl: 3 .  dolutegravir (TIVICAY) 50 MG tablet, Take 1 tablet (50 mg total) by mouth daily., Disp: 90 tablet, Rfl: 3 .  emtricitabine-tenofovir AF (DESCOVY) 200-25 MG tablet, Take 1 tablet by mouth daily., Disp: 90 tablet, Rfl: 3 .  feeding supplement, ENSURE ENLIVE, (ENSURE ENLIVE) LIQD, Take 237 mLs by mouth 2 (two) times daily between meals., Disp: 30 Bottle, Rfl: 12 .  fluconazole (DIFLUCAN) 200 MG tablet, Take 200 mg by mouth daily., Disp: , Rfl:  .  Menthol-Methyl Salicylate (MUSCLE RUB) 10-15 % CREA, Apply 1 application topically 2 (two) times daily. To neck and shoulders, Disp: 113 g, Rfl: 0 .  Multiple Vitamin (MULTIVITAMIN WITH MINERALS)  TABS tablet, Take 1 tablet by mouth daily., Disp: 30 tablet, Rfl: 0 .  promethazine (PHENERGAN) 25 MG tablet, Take 25 mg by mouth every 6 (six) hours as needed for nausea or vomiting., Disp: , Rfl:  .  triamcinolone ointment (KENALOG) 0.5 %, Apply 1 application topically 2 (two) times daily., Disp: 30 g, Rfl: 0 .  vitamin B-12 (CYANOCOBALAMIN) 100 MCG tablet, Take 1 tablet (100 mcg total) by mouth daily., Disp: 30 tablet, Rfl: 0 .  Vitamin D, Ergocalciferol, (DRISDOL) 50000 units CAPS capsule, Take 1 capsule (50,000 Units total) by mouth every Friday., Disp: 4 capsule, Rfl: 0   Review of Systems  Constitutional: Negative for chills, diaphoresis and fever.  HENT: Negative for congestion, hearing loss, sore throat and tinnitus.   Respiratory: Negative for cough, shortness of breath and wheezing.   Cardiovascular: Negative for chest pain, palpitations and leg swelling.  Gastrointestinal: Negative for abdominal pain, blood in stool, constipation, diarrhea, nausea and vomiting.  Genitourinary: Negative for dysuria, flank pain and hematuria.  Musculoskeletal: Negative for back pain and myalgias.  Skin: Negative for rash.  Neurological: Negative for dizziness, weakness and headaches.  Hematological: Does not bruise/bleed easily.  Psychiatric/Behavioral: Negative for suicidal ideas. The patient is not nervous/anxious.        Objective:   Physical Exam  Constitutional: She is oriented to person, place, and time. No distress.  HENT:  Head: Normocephalic and atraumatic.  Mouth/Throat: Uvula is midline and oropharynx is clear and moist. No oropharyngeal exudate.  Eyes: Conjunctivae and EOM are normal. No scleral icterus.  Neck: Normal range of motion. Neck supple.  Cardiovascular: Normal rate, regular rhythm and normal heart sounds.  Exam reveals no gallop and no friction rub.   No murmur heard. Pulmonary/Chest: Effort normal. No respiratory distress. She has no wheezes.     She exhibits no  tenderness.  Abdominal: Soft. Bowel sounds are normal. She exhibits no distension. There is no tenderness.  Musculoskeletal: She exhibits no edema or tenderness.  Neurological: She is alert and oriented to person, place, and time. She exhibits normal muscle tone. Coordination normal.  Skin: Skin is warm and dry. No rash noted. She is not diaphoretic. No erythema. No pallor.  Psychiatric: She has a normal mood and affect. Her behavior is normal. Judgment normal.     Assessment & Plan:    HIV/AIDS: recheck VL and CD4  In 2 months . They're being shipped from the [facility] pharmacy to her home. She does also need to continue dapsone for long as her CD4 count has not been brought above 200 for 3 consecutive months  CMV esophagitis: resolved  Thrush: Resolved  Nausea and vomiting: Resolved   Rash: Maybe a little bit of immune  reconstitution syndrome will give her a more potent topical steroid, resolved now  We spent greater than 25 minutes with the patient including greater than 50% of time in face to face counsel of the patient and her husband re her HIV/AIDS, CMV esophagitis, thrush, rash, nausea and vomiting and in coordination of her care.

## 2016-03-28 MED FILL — DAPSONE 100 MG TABLET: 100 | 30 days supply | Qty: 30 | Fill #3

## 2016-03-28 MED FILL — TIVICAY 50 MG TABLET: 50 | 30 days supply | Qty: 30 | Fill #3

## 2016-03-28 MED FILL — DESCOVY 200-25 MG TABS: 200-25 | 30 days supply | Qty: 30 | Fill #3

## 2016-04-24 DIAGNOSIS — E8809 Other disorders of plasma-protein metabolism, not elsewhere classified: Secondary | ICD-10-CM | POA: Insufficient documentation

## 2016-05-02 MED FILL — DAPSONE 100 MG TABLET: 100 | 30 days supply | Qty: 30 | Fill #4 | Status: TO

## 2016-05-02 MED FILL — DESCOVY 200-25 MG TABS: 200-25 | 30 days supply | Qty: 30 | Fill #4 | Status: TO

## 2016-05-02 MED FILL — TIVICAY 50 MG TABLET: 50 | 30 days supply | Qty: 30 | Fill #4 | Status: TO

## 2016-05-21 ENCOUNTER — Other Ambulatory Visit: Payer: Managed Care, Other (non HMO)

## 2016-05-30 MED FILL — DESCOVY 200-25 MG TABS: 200-25 | 30 days supply | Qty: 30 | Fill #0

## 2016-05-30 MED FILL — TIVICAY 50 MG TABLET: 50 | 30 days supply | Qty: 30 | Fill #0

## 2016-06-11 ENCOUNTER — Ambulatory Visit: Payer: Managed Care, Other (non HMO) | Admitting: Infectious Disease

## 2016-06-15 ENCOUNTER — Telehealth: Payer: Self-pay | Admitting: *Deleted

## 2016-06-15 NOTE — Telephone Encounter (Signed)
Unable to reach pt by phone to remind of upcoming appts.  Requested that [facility] RN, Lind Covertmbre McNeil, go to the patient's home address to remind her of these appointments.

## 2016-06-18 ENCOUNTER — Telehealth: Payer: Self-pay | Admitting: *Deleted

## 2016-06-18 NOTE — Telephone Encounter (Signed)
RN received a request from Denise/RN for me to get in contact with Samantha Kline to make her aware of her upcoming appt

## 2016-06-19 ENCOUNTER — Ambulatory Visit: Payer: Managed Care, Other (non HMO)

## 2016-06-19 ENCOUNTER — Other Ambulatory Visit: Payer: Managed Care, Other (non HMO)

## 2016-06-21 ENCOUNTER — Ambulatory Visit: Payer: Managed Care, Other (non HMO) | Admitting: Pharmacist

## 2016-06-21 DIAGNOSIS — B20 Human immunodeficiency virus [HIV] disease: Secondary | ICD-10-CM

## 2016-06-21 DIAGNOSIS — Z79899 Other long term (current) drug therapy: Secondary | ICD-10-CM

## 2016-06-21 LAB — LIPID PANEL
Cholesterol: 198 mg/dL (ref <200)
HDL: 51 mg/dL (ref >50)
LDL Cholesterol: 129 mg/dL — ABNORMAL HIGH (ref <100)
Total CHOL/HDL Ratio: 3.9 Ratio (ref <5.0)
Triglycerides: 89 mg/dL (ref <150)
VLDL: 18 mg/dL (ref <30)

## 2016-06-21 LAB — CBC WITH DIFFERENTIAL/PLATELET
BASOS PCT: 1 %
Basophils Absolute: 98 cells/uL (ref 0–200)
EOS ABS: 392 {cells}/uL (ref 15–500)
EOS PCT: 4 %
HCT: 31.6 % — ABNORMAL LOW (ref 35.0–45.0)
Hemoglobin: 10.2 g/dL — ABNORMAL LOW (ref 11.7–15.5)
LYMPHS PCT: 40 %
Lymphs Abs: 3920 cells/uL — ABNORMAL HIGH (ref 850–3900)
MCH: 31.3 pg (ref 27.0–33.0)
MCHC: 32.3 g/dL (ref 32.0–36.0)
MCV: 96.9 fL (ref 80.0–100.0)
MONOS PCT: 10 %
MPV: 10 fL (ref 7.5–12.5)
Monocytes Absolute: 980 cells/uL — ABNORMAL HIGH (ref 200–950)
NEUTROS ABS: 4410 {cells}/uL (ref 1500–7800)
Neutrophils Relative %: 45 %
PLATELETS: 375 10*3/uL (ref 140–400)
RBC: 3.26 MIL/uL — ABNORMAL LOW (ref 3.80–5.10)
RDW: 14.1 % (ref 11.0–15.0)
WBC: 9.8 10*3/uL (ref 3.8–10.8)

## 2016-06-21 LAB — COMPLETE METABOLIC PANEL WITH GFR
ALT: 7 U/L (ref 6–29)
AST: 17 U/L (ref 10–35)
Albumin: 3.3 g/dL — ABNORMAL LOW (ref 3.6–5.1)
Alkaline Phosphatase: 73 U/L (ref 33–130)
BUN: 15 mg/dL (ref 7–25)
CHLORIDE: 102 mmol/L (ref 98–110)
CO2: 26 mmol/L (ref 20–31)
CREATININE: 0.97 mg/dL (ref 0.50–1.05)
Calcium: 9.4 mg/dL (ref 8.6–10.4)
GFR, EST AFRICAN AMERICAN: 75 mL/min (ref >=60)
GFR, EST NON AFRICAN AMERICAN: 65 mL/min (ref >=60)
GLUCOSE: 82 mg/dL (ref 65–99)
Potassium: 4.3 mmol/L (ref 3.5–5.3)
SODIUM: 135 mmol/L (ref 135–146)
Total Bilirubin: 0.4 mg/dL (ref 0.2–1.2)
Total Protein: 8.6 g/dL — ABNORMAL HIGH (ref 6.1–8.1)

## 2016-06-21 MED FILL — DAPSONE 100 MG TABLET: 100 | 30 days supply | Qty: 30 | Fill #0

## 2016-06-21 NOTE — Addendum Note (Signed)
Addended by: Mariea ClontsGREEN, Dillard Pascal D on: 06/21/2016 04:14 PM   Modules accepted: Orders

## 2016-06-21 NOTE — Progress Notes (Signed)
Came for labs and to give copay for her dapsone.

## 2016-06-22 LAB — RPR

## 2016-06-22 LAB — HEPATITIS B CORE ANTIBODY, TOTAL: Hep B Core Total Ab: NONREACTIVE

## 2016-06-22 LAB — T-HELPER CELL (CD4) - (RCID CLINIC ONLY)
CD4 % Helper T Cell: 7 % — ABNORMAL LOW (ref 33–55)
CD4 T Cell Abs: 260 /uL — ABNORMAL LOW (ref 400–2700)

## 2016-06-22 LAB — HEPATITIS C ANTIBODY: HCV AB: NEGATIVE

## 2016-06-22 LAB — HEPATITIS B SURFACE ANTIGEN: Hepatitis B Surface Ag: NEGATIVE

## 2016-06-22 LAB — HEPATITIS A ANTIBODY, TOTAL: HEP A TOTAL AB: NONREACTIVE

## 2016-06-22 LAB — HEPATITIS B SURFACE ANTIBODY,QUALITATIVE: HEP B S AB: NEGATIVE

## 2016-06-24 LAB — HIV RNA, RTPCR W/R GT (RTI, PI,INT): HIV-1 RNA, QN PCR: <1.3 Log copies/mL

## 2016-06-26 MED FILL — DESCOVY 200-25 MG TABS: 200-25 | 30 days supply | Qty: 30 | Fill #1

## 2016-06-26 MED FILL — TIVICAY 50 MG TABLET: 50 | 30 days supply | Qty: 30 | Fill #1

## 2016-07-03 ENCOUNTER — Ambulatory Visit: Payer: Managed Care, Other (non HMO) | Admitting: Infectious Disease

## 2016-07-03 ENCOUNTER — Ambulatory Visit: Payer: Managed Care, Other (non HMO)

## 2016-07-04 ENCOUNTER — Ambulatory Visit (INDEPENDENT_AMBULATORY_CARE_PROVIDER_SITE_OTHER): Payer: Managed Care, Other (non HMO) | Admitting: Infectious Disease

## 2016-07-04 DIAGNOSIS — K208 Other esophagitis without bleeding: Secondary | ICD-10-CM

## 2016-07-04 DIAGNOSIS — E43 Unspecified severe protein-calorie malnutrition: Secondary | ICD-10-CM | POA: Diagnosis not present

## 2016-07-04 DIAGNOSIS — R634 Abnormal weight loss: Secondary | ICD-10-CM | POA: Diagnosis not present

## 2016-07-04 DIAGNOSIS — B370 Candidal stomatitis: Secondary | ICD-10-CM | POA: Diagnosis not present

## 2016-07-04 DIAGNOSIS — G9340 Encephalopathy, unspecified: Secondary | ICD-10-CM

## 2016-07-04 DIAGNOSIS — B59 Pneumocystosis: Secondary | ICD-10-CM

## 2016-07-04 DIAGNOSIS — B20 Human immunodeficiency virus [HIV] disease: Secondary | ICD-10-CM

## 2016-07-04 DIAGNOSIS — G9349 Other encephalopathy: Secondary | ICD-10-CM

## 2016-07-04 DIAGNOSIS — B258 Other cytomegaloviral diseases: Secondary | ICD-10-CM | POA: Diagnosis not present

## 2016-07-04 MED ORDER — BICTEGRAVIR-EMTRICITAB-TENOFOV 50-200-25 MG PO TABS: 1.0000 | ORAL_TABLET | Freq: Every day | ORAL | 11 refills | Status: DC

## 2016-07-04 MED FILL — BIKTARVY 50-200-25 MG TABS: 50-200-25 | 30 days supply | Qty: 30 | Fill #0

## 2016-07-04 NOTE — Progress Notes (Signed)
Subjective:   Chief complaint: followup for her HIV   Patient ID: Samantha Kline, female    DOB: Jun 06, 1960, 56 y.o.   MRN: [id number]  HPI  56 year old admitted with PCP pneumonia, newly diagnosed HIV/AIDS and found to have candida esophagitis, fevers, then CMV esophagitis.   She had protracted stay and eventually went to inpatient rehab.   She completed 21 days of GCV for her CMV esophagitis supported by CSF for part of her therapy. She did continue to have neutropenia, leukopenia after coming off the GCV and her G-CSF.    Her virus responded very nicely to [place] and Descovy  She has maintained very nice urological control andnow healthy immune reconstitution with her CD4 count at 260. I would like to stop her dapsone along with her fluconazole.  We discussed simplification to single tablet regimen of BIKTARVY.     Lab Results  Component Value Date   HIV1RNAQUANT <20 03/06/2016   HIV1RNAQUANT <20 02/21/2016   HIV1RNAQUANT 81 (H) 01/03/2016    Lab Results  Component Value Date   CD4TABS 260 (L) 06/21/2016   CD4TABS 190 (L) 03/06/2016   CD4TABS 210 (L) 02/21/2016      Lab Results  Component Value Date   HIV1RNAQUANT <20 03/06/2016   HIV1RNAQUANT <20 02/21/2016   HIV1RNAQUANT 81 (H) 01/03/2016   Lab Results  Component Value Date   CD4TABS 260 (L) 06/21/2016   CD4TABS 190 (L) 03/06/2016   CD4TABS 210 (L) 02/21/2016      Past Medical History:  Diagnosis Date  . Anemia   . HIV (human immunodeficiency virus infection) (HCC)    11/06/15 Family states it was just diagnosed this week  . Pneumonia 10/24/2015  . Rash and nonspecific skin eruption 02/21/2016  . Vitamin D deficiency     Past Surgical History:  Procedure Laterality Date  . CLOSED REDUCTION FINGER WITH PERCUTANEOUS PINNING Left 12/18/2015   Procedure: Closed reduction of Left long finger PIP dislocation;  Surgeon: Dairl Ponder, MD;  Location: [facility] OR;  Service: Orthopedics;  Laterality: Left;    . ESOPHAGOGASTRODUODENOSCOPY N/A 11/23/2015   Procedure: ESOPHAGOGASTRODUODENOSCOPY (EGD);  Surgeon: Vida Rigger, MD;  Location: [facility] END[facility]COPY;  Service: Endoscopy;  Laterality: N/A;  . TUBAL LIGATION  1980s    Family History  Problem Relation Age of Onset  . Diabetes Father   . Heart attack Brother   . Hypertension Sister       Social History   Social History  . Marital status: Married    Spouse name: N/A  . Number of children: N/A  . Years of education: N/A   Social History Main Topics  . Smoking status: Never Smoker  . Smokeless tobacco: Never Used  . Alcohol use No     Comment: 10/24/2015 "nothing since the 1980s"  . Drug use: No  . Sexual activity: Not on file   Other Topics Concern  . Not on file   Social History Narrative  . No narrative on file    Allergies  Allergen Reactions  . Septra [Sulfamethoxazole-Trimethoprim] Anaphylaxis    Possible cause of hyponatremia Possible cause of hyponatremia     Current Outpatient Prescriptions:  .  dapsone 100 MG tablet, Take 1 tablet (100 mg total) by mouth daily., Disp: 90 tablet, Rfl: 3 .  dolutegravir (TIVICAY) 50 MG tablet, Take 1 tablet (50 mg total) by mouth daily., Disp: 90 tablet, Rfl: 3 .  emtricitabine-tenofovir AF (DESCOVY) 200-25 MG tablet, Take 1 tablet by mouth daily.,  Disp: 90 tablet, Rfl: 3 .  feeding supplement, ENSURE ENLIVE, (ENSURE ENLIVE) LIQD, Take 237 mLs by mouth 2 (two) times daily between meals., Disp: 30 Bottle, Rfl: 12 .  fluconazole (DIFLUCAN) 200 MG tablet, Take 200 mg by mouth daily., Disp: , Rfl:  .  Menthol-Methyl Salicylate (MUSCLE RUB) 10-15 % CREA, Apply 1 application topically 2 (two) times daily. To neck and shoulders, Disp: 113 g, Rfl: 0 .  Multiple Vitamin (MULTIVITAMIN WITH MINERALS) TABS tablet, Take 1 tablet by mouth daily., Disp: 30 tablet, Rfl: 0 .  promethazine (PHENERGAN) 25 MG tablet, Take 25 mg by mouth every 6 (six) hours as needed for nausea or vomiting., Disp: , Rfl:  .   triamcinolone ointment (KENALOG) 0.5 %, Apply 1 application topically 2 (two) times daily., Disp: 30 g, Rfl: 0 .  vitamin B-12 (CYANOCOBALAMIN) 100 MCG tablet, Take 1 tablet (100 mcg total) by mouth daily., Disp: 30 tablet, Rfl: 0 .  Vitamin D, Ergocalciferol, (DRISDOL) 50000 units CAPS capsule, Take 1 capsule (50,000 Units total) by mouth every Friday., Disp: 4 capsule, Rfl: 0   Review of Systems  Constitutional: Negative for chills, diaphoresis and fever.  HENT: Negative for congestion, hearing loss, sore throat and tinnitus.   Respiratory: Negative for cough, shortness of breath and wheezing.   Cardiovascular: Negative for chest pain, palpitations and leg swelling.  Gastrointestinal: Negative for abdominal pain, blood in stool, constipation, diarrhea, nausea and vomiting.  Genitourinary: Negative for dysuria, flank pain and hematuria.  Musculoskeletal: Negative for back pain and myalgias.  Skin: Negative for rash.  Neurological: Negative for dizziness, weakness and headaches.  Hematological: Does not bruise/bleed easily.  Psychiatric/Behavioral: Negative for suicidal ideas. The patient is not nervous/anxious.        Objective:   Physical Exam  Constitutional: She is oriented to person, place, and time. No distress.  HENT:  Head: Normocephalic and atraumatic.  Mouth/Throat: Uvula is midline and oropharynx is clear and moist. No oropharyngeal exudate.  Eyes: Conjunctivae and EOM are normal. No scleral icterus.  Neck: Normal range of motion. Neck supple.  Cardiovascular: Normal rate, regular rhythm and normal heart sounds.  Exam reveals no gallop and no friction rub.   No murmur heard. Pulmonary/Chest: Effort normal and breath sounds normal. No respiratory distress. She has no wheezes. She has no rales. She exhibits no tenderness.  Abdominal: Soft. Bowel sounds are normal. She exhibits no distension. There is no tenderness.  Musculoskeletal: She exhibits no edema or tenderness.    Neurological: She is alert and oriented to person, place, and time. She exhibits normal muscle tone. Coordination normal.  Skin: Skin is warm and dry. No rash noted. She is not diaphoretic. No erythema. No pallor.  Psychiatric: She has a normal mood and affect. Her behavior is normal. Judgment normal.     Assessment & Plan:    HIV/AIDS: change to BIKTARVY. Recheck labs in a month. She will followup with [facility] pharmacy in next 2 weeks. I am DC her fluconazole and dapsone  Husband should have another oraquick test here in clinic at minimum.  CMV esophagitis: resolved  Thrush: Resolved  Nausea and vomiting: Resolved   Rash: Maybe a little bit of immune reconstitution syndrome will give her a more potent topical steroid, resolved now  Low vitamin D: change from 50k per week to daily dose with Calcium (but not to be taken at same time, rather separately from her ARV  We spent greater than 25  minutes with the patient including  greater than 50% of time in face to face counsel of the patient and her husband re her HIV/AIDS, CMV esophagitis, thrush, rash, nausea and vomiting , vitamin D levels and in coordination of her Kline.

## 2016-07-04 NOTE — Progress Notes (Signed)
HPI: Samantha Kline is a 56 y.o. female who is here for her HIV visit.   Allergies: Allergies  Allergen Reactions  . Septra [Sulfamethoxazole-Trimethoprim] Anaphylaxis    Possible cause of hyponatremia Possible cause of hyponatremia    Vitals:    Past Medical History: Past Medical History:  Diagnosis Date  . Anemia   . HIV (human immunodeficiency virus infection) (HCC)    11/06/15 Family states it was just diagnosed this week  . Pneumonia 10/24/2015  . Rash and nonspecific skin eruption 02/21/2016  . Vitamin D deficiency     Social History: Social History   Social History  . Marital status: Married    Spouse name: N/A  . Number of children: N/A  . Years of education: N/A   Social History Main Topics  . Smoking status: Never Smoker  . Smokeless tobacco: Never Used  . Alcohol use No     Comment: 10/24/2015 "nothing since the 1980s"  . Drug use: No  . Sexual activity: Not on file   Other Topics Concern  . Not on file   Social History Narrative  . No narrative on file    Previous Regimen: None  Current Regimen: DTG/Descovy  Labs: HIV 1 RNA Quant  Date Value  03/06/2016 <20 copies/mL  02/21/2016 <20 copies /mL  01/03/2016 81 copies/mL (H)   CD4 T Cell Abs (/uL)  Date Value  06/21/2016 260 (L)  03/06/2016 190 (L)  02/21/2016 210 (L)   Hep B S Ab (no units)  Date Value  06/21/2016 NEG   Hepatitis B Surface Ag (no units)  Date Value  06/21/2016 NEGATIVE   HCV Ab (no units)  Date Value  06/21/2016 NEGATIVE    CrCl: CrCl cannot be calculated (Unknown ideal weight.).  Lipids:    Component Value Date/Time   CHOL 198 06/21/2016 1632   TRIG 89 06/21/2016 1632   HDL 51 06/21/2016 1632   CHOLHDL 3.9 06/21/2016 1632   VLDL 18 06/21/2016 1632   LDLCALC 129 (H) 06/21/2016 1632    Assessment: Samantha Kline is doing very well on her current regimen. VL and CD4 have improved significantly. She has not had any side effects or missed any doses so far. Dr. Daiva EvesVan  Dam decided to change her regimen to [facility]. Confirm with [facility] pharmacy and no copay. Counseled her on side effects and drug interactions.   Recommendations:  Dc DTG/Descovy Start Biktarvy 1 PO qday F/u with pharmacy in 2 wks  Ulyses SouthwardMinh Pham, PharmD, BCPS, AAHIVP, CPP Clinical Infectious Disease Pharmacist [facility] 07/04/2016, 11:17 PM

## 2016-07-04 NOTE — Patient Instructions (Signed)
I am STOPPING your DAPSONE and fluconazole  I am going to try to change you to [facility] one pill once a day to take the place of Tivicay and Descovy  I would like you to come back and meet with pharmacy in 2 weeks and then come back for repeat blood work in a month and see me in 6 weeks

## 2016-07-12 ENCOUNTER — Encounter: Payer: Self-pay | Admitting: *Deleted

## 2016-07-12 NOTE — Progress Notes (Signed)
RN following up on old referrals and in the past I have not been able to reach Samantha. Kline by phone. I have called on several occasions with messages left stating upcoming appt date and times.  On review of the chart it appears Samantha Manson PasseyBrown was able to make her appt with Dr Daiva EvesVan Dam on 3/14 with wonderful compliance. Medications were changed to a one pill regimen and current viral load is suppressed. At this time I will not longer follow up on Samantha Manson PasseyBrown unless a new need is identified.

## 2016-07-18 ENCOUNTER — Ambulatory Visit (INDEPENDENT_AMBULATORY_CARE_PROVIDER_SITE_OTHER): Payer: Managed Care, Other (non HMO) | Admitting: Pharmacist Clinician (PhC)/ Clinical Pharmacy Specialist

## 2016-07-18 DIAGNOSIS — Z23 Encounter for immunization: Secondary | ICD-10-CM | POA: Diagnosis not present

## 2016-07-18 DIAGNOSIS — B20 Human immunodeficiency virus [HIV] disease: Secondary | ICD-10-CM

## 2016-07-18 NOTE — Patient Instructions (Signed)
Come back for lab in April and follow with Dr Daiva EvesVan Dam Second hep b vaccine at the next visit

## 2016-07-18 NOTE — Progress Notes (Signed)
HPI: Samantha Kline is a 56 y.o. female who is here for her visit with pharmacy after a recent change in her ART.   Allergies: Allergies  Allergen Reactions  . Septra [Sulfamethoxazole-Trimethoprim] Anaphylaxis    Possible cause of hyponatremia Possible cause of hyponatremia    Vitals:    Past Medical History: Past Medical History:  Diagnosis Date  . Anemia   . HIV (human immunodeficiency virus infection) (HCC)    11/06/15 Family states it was just diagnosed this week  . Pneumonia 10/24/2015  . Rash and nonspecific skin eruption 02/21/2016  . Vitamin D deficiency     Social History: Social History   Social History  . Marital status: Married    Spouse name: N/A  . Number of children: N/A  . Years of education: N/A   Social History Main Topics  . Smoking status: Never Smoker  . Smokeless tobacco: Never Used  . Alcohol use No     Comment: 10/24/2015 "nothing since the 1980s"  . Drug use: No  . Sexual activity: Not on file   Other Topics Concern  . Not on file   Social History Narrative  . No narrative on file    Previous Regimen: DTG/descovy  Current Regimen: Biktarvy  Labs: HIV 1 RNA Quant  Date Value  03/06/2016 <20 copies/mL  02/21/2016 <20 copies /mL  01/03/2016 81 copies/mL (H)   CD4 T Cell Abs (/uL)  Date Value  06/21/2016 260 (L)  03/06/2016 190 (L)  02/21/2016 210 (L)   Hep B S Ab (no units)  Date Value  06/21/2016 NEG   Hepatitis B Surface Ag (no units)  Date Value  06/21/2016 NEGATIVE   HCV Ab (no units)  Date Value  06/21/2016 NEGATIVE    CrCl: CrCl cannot be calculated (Patient's most recent lab result is older than the maximum 21 days allowed.).  Lipids:    Component Value Date/Time   CHOL 198 06/21/2016 1632   TRIG 89 06/21/2016 1632   HDL 51 06/21/2016 1632   CHOLHDL 3.9 06/21/2016 1632   VLDL 18 06/21/2016 1632   LDLCALC 129 (H) 06/21/2016 1632    Assessment: Samantha Kline is doing fantastic on the new Biktarvy. She has not  had any side effect and has not missed any doses so far. Her VL has been undetectable and CD4 is now in the mid 200s. She is both hep A and B ab neg so will start the series today. Even though she is >55, we will give her he Menveo vaccine. Explain the benefits of getting these vaccines. I'll not do labs today because she has an lab appt mid April before the appt with Dr. Daiva EvesVan Dam. It's a bit early to do labs on the new regimen anyway.   Recommendations:  Cont Biktarvy 1 PO qday Hep A/B and Menveo today She will need the second hep B at the appt with Dr. Daiva EvesVan Dam in April  Ulyses SouthwardMinh Pham, PharmD, BCPS, AAHIVP, CPP Clinical Infectious Disease Pharmacist [facility] 07/18/2016, 10:09 PM

## 2016-08-01 ENCOUNTER — Other Ambulatory Visit: Payer: Managed Care, Other (non HMO)

## 2016-08-01 ENCOUNTER — Other Ambulatory Visit (HOSPITAL_COMMUNITY)
Admission: RE | Admit: 2016-08-01 | Discharge: 2016-08-01 | Disposition: A | Discharge status: Home / Self Care | Payer: Managed Care, Other (non HMO) | Source: Ambulatory Visit | Attending: Infectious Disease | Admitting: Infectious Disease

## 2016-08-01 DIAGNOSIS — B20 Human immunodeficiency virus [HIV] disease: Secondary | ICD-10-CM

## 2016-08-01 DIAGNOSIS — Z113 Encounter for screening for infections with a predominantly sexual mode of transmission: Secondary | ICD-10-CM | POA: Insufficient documentation

## 2016-08-01 LAB — CBC WITH DIFFERENTIAL/PLATELET
Basophils Absolute: 111 cells/uL (ref 0–200)
Basophils Relative: 1 %
EOS ABS: 333 {cells}/uL (ref 15–500)
Eosinophils Relative: 3 %
HEMATOCRIT: 34.1 % — AB (ref 35.0–45.0)
Hemoglobin: 11.2 g/dL — ABNORMAL LOW (ref 11.7–15.5)
LYMPHS PCT: 39 %
Lymphs Abs: 4329 cells/uL — ABNORMAL HIGH (ref 850–3900)
MCH: 30.8 pg (ref 27.0–33.0)
MCHC: 32.8 g/dL (ref 32.0–36.0)
MCV: 93.7 fL (ref 80.0–100.0)
MONO ABS: 777 {cells}/uL (ref 200–950)
MPV: 9.7 fL (ref 7.5–12.5)
Monocytes Relative: 7 %
NEUTROS PCT: 50 %
Neutro Abs: 5550 cells/uL (ref 1500–7800)
Platelets: 407 10*3/uL — ABNORMAL HIGH (ref 140–400)
RBC: 3.64 MIL/uL — AB (ref 3.80–5.10)
RDW: 13.7 % (ref 11.0–15.0)
WBC: 11.1 10*3/uL — ABNORMAL HIGH (ref 3.8–10.8)

## 2016-08-01 LAB — COMPLETE METABOLIC PANEL WITH GFR
ALT: 8 U/L (ref 6–29)
AST: 18 U/L (ref 10–35)
Albumin: 3.4 g/dL — ABNORMAL LOW (ref 3.6–5.1)
Alkaline Phosphatase: 94 U/L (ref 33–130)
BILIRUBIN TOTAL: 0.2 mg/dL (ref 0.2–1.2)
BUN: 7 mg/dL (ref 7–25)
CO2: 28 mmol/L (ref 20–31)
Calcium: 9.3 mg/dL (ref 8.6–10.4)
Chloride: 102 mmol/L (ref 98–110)
Creat: 0.97 mg/dL (ref 0.50–1.05)
GFR, EST NON AFRICAN AMERICAN: 65 mL/min (ref >=60)
GFR, Est African American: 75 mL/min (ref >=60)
GLUCOSE: 86 mg/dL (ref 65–99)
Potassium: 4.5 mmol/L (ref 3.5–5.3)
SODIUM: 136 mmol/L (ref 135–146)
TOTAL PROTEIN: 8.3 g/dL — AB (ref 6.1–8.1)

## 2016-08-01 MED FILL — BIKTARVY 50-200-25 MG TABS: 50-200-25 | 30 days supply | Qty: 30 | Fill #1

## 2016-08-02 LAB — URINALYSIS
BILIRUBIN URINE: NEGATIVE
GLUCOSE, UA: NEGATIVE
Hgb urine dipstick: NEGATIVE
KETONES UR: NEGATIVE
Leukocytes, UA: NEGATIVE
Nitrite: NEGATIVE
PH: 7 (ref 5.0–8.0)
Protein, ur: NEGATIVE
SPECIFIC GRAVITY, URINE: 1.016 (ref 1.001–1.035)

## 2016-08-02 LAB — T-HELPER CELL (CD4) - (RCID CLINIC ONLY)
CD4 % Helper T Cell: 11 % — ABNORMAL LOW (ref 33–55)
CD4 T Cell Abs: 490 /uL (ref 400–2700)

## 2016-08-02 LAB — URINE CYTOLOGY ANCILLARY ONLY
Chlamydia: NEGATIVE
Neisseria Gonorrhea: NEGATIVE

## 2016-08-02 LAB — RPR

## 2016-08-03 LAB — HIV-1 RNA QUANT-NO REFLEX-BLD
HIV 1 RNA Quant: 50 copies/mL — ABNORMAL HIGH
HIV-1 RNA Quant, Log: 1.7 Log copies/mL — ABNORMAL HIGH

## 2016-08-15 ENCOUNTER — Ambulatory Visit (INDEPENDENT_AMBULATORY_CARE_PROVIDER_SITE_OTHER): Payer: Managed Care, Other (non HMO) | Admitting: Infectious Disease

## 2016-08-15 ENCOUNTER — Encounter: Payer: Self-pay | Admitting: Infectious Disease

## 2016-08-15 VITALS — BP 122/74 | HR 92 | Temp 98.4°F | Wt 135.0 lb

## 2016-08-15 DIAGNOSIS — D702 Other drug-induced agranulocytosis: Secondary | ICD-10-CM | POA: Diagnosis not present

## 2016-08-15 DIAGNOSIS — B258 Other cytomegaloviral diseases: Secondary | ICD-10-CM | POA: Diagnosis not present

## 2016-08-15 DIAGNOSIS — B20 Human immunodeficiency virus [HIV] disease: Secondary | ICD-10-CM

## 2016-08-15 DIAGNOSIS — K208 Other esophagitis: Secondary | ICD-10-CM

## 2016-08-15 DIAGNOSIS — B259 Cytomegaloviral disease, unspecified: Secondary | ICD-10-CM

## 2016-08-15 MED ORDER — BICTEGRAVIR-EMTRICITAB-TENOFOV 50-200-25 MG PO TABS
1.0000 | ORAL_TABLET | Freq: Every day | ORAL | 11 refills | Status: DC
Start: 2016-08-15 — End: 2017-06-12

## 2016-08-15 NOTE — Progress Notes (Signed)
Subjective:   Chief complaint: followup for her HIV   Patient ID: Samantha Kline, female    DOB: Oct 08, 1960, 56 y.o.   MRN: [id number]  HPI  56 year old admitted with PCP pneumonia, newly diagnosed HIV/AIDS and found to have candida esophagitis, fevers, then CMV esophagitis.   She had protracted stay and eventually went to inpatient rehab.   She completed 21 days of GCV for her CMV esophagitis supported by CSF for part of her therapy. She did continue to have neutropenia, leukopenia after coming off the GCV and her G-CSF.    Her virus responded very nicely to [place] and Descovy  She has maintained very nice urological control andnow healthy immune reconstitution with her CD4 count at 260. We changed her to  [facility] and she is still nicely suppressed though with slight blip to 50. Her CD4 is well above 400 now!     Lab Results  Component Value Date   HIV1RNAQUANT 50 (H) 08/01/2016   HIV1RNAQUANT <20 03/06/2016   HIV1RNAQUANT <20 02/21/2016    Lab Results  Component Value Date   CD4TABS 490 08/01/2016   CD4TABS 260 (L) 06/21/2016   CD4TABS 190 (L) 03/06/2016      Past Medical History:  Diagnosis Date  . Anemia   . HIV (human immunodeficiency virus infection) (HCC)    11/06/15 Family states it was just diagnosed this week  . Pneumonia 10/24/2015  . Rash and nonspecific skin eruption 02/21/2016  . Vitamin D deficiency     Past Surgical History:  Procedure Laterality Date  . CLOSED REDUCTION FINGER WITH PERCUTANEOUS PINNING Left 12/18/2015   Procedure: Closed reduction of Left long finger PIP dislocation;  Surgeon: Dairl Ponder, MD;  Location: [facility] OR;  Service: Orthopedics;  Laterality: Left;  . ESOPHAGOGASTRODUODENOSCOPY N/A 11/23/2015   Procedure: ESOPHAGOGASTRODUODENOSCOPY (EGD);  Surgeon: Vida Rigger, MD;  Location: [facility] ENDOSCOPY;  Service: Endoscopy;  Laterality: N/A;  . TUBAL LIGATION  1980s    Family History  Problem Relation Age of Onset  . Diabetes Father    . Heart attack Brother   . Hypertension Sister       Social History   Social History  . Marital status: Married    Spouse name: N/A  . Number of children: N/A  . Years of education: N/A   Social History Main Topics  . Smoking status: Never Smoker  . Smokeless tobacco: Never Used  . Alcohol use No     Comment: 10/24/2015 "nothing since the 1980s"  . Drug use: No  . Sexual activity: Not Asked   Other Topics Concern  . None   Social History Narrative  . None    Allergies  Allergen Reactions  . Septra [Sulfamethoxazole-Trimethoprim] Anaphylaxis    Possible cause of hyponatremia Possible cause of hyponatremia     Current Outpatient Prescriptions:  .  bictegravir-emtricitabine-tenofovir AF (BIKTARVY) 50-200-25 MG TABS tablet, Take 1 tablet by mouth daily., Disp: 30 tablet, Rfl: 11 .  promethazine (PHENERGAN) 25 MG tablet, Take 25 mg by mouth every 6 (six) hours as needed for nausea or vomiting., Disp: , Rfl:  .  vitamin B-12 (CYANOCOBALAMIN) 100 MCG tablet, Take 1 tablet (100 mcg total) by mouth daily., Disp: 30 tablet, Rfl: 0   Review of Systems  Constitutional: Negative for chills, diaphoresis and fever.  HENT: Negative for congestion, hearing loss, sore throat and tinnitus.   Respiratory: Negative for cough, shortness of breath and wheezing.   Cardiovascular: Negative for chest pain, palpitations and  leg swelling.  Gastrointestinal: Negative for abdominal pain, blood in stool, constipation, diarrhea, nausea and vomiting.  Genitourinary: Negative for dysuria, flank pain and hematuria.  Musculoskeletal: Negative for back pain and myalgias.  Skin: Negative for rash.  Neurological: Negative for dizziness, weakness and headaches.  Hematological: Does not bruise/bleed easily.  Psychiatric/Behavioral: Negative for suicidal ideas. The patient is not nervous/anxious.        Objective:   Physical Exam  Constitutional: She is oriented to person, place, and time. No  distress.  HENT:  Head: Normocephalic and atraumatic.  Mouth/Throat: Uvula is midline and oropharynx is clear and moist. No oropharyngeal exudate.  Eyes: Conjunctivae and EOM are normal. No scleral icterus.  Neck: Normal range of motion. Neck supple.  Cardiovascular: Normal rate, regular rhythm and normal heart sounds.  Exam reveals no gallop and no friction rub.   No murmur heard. Pulmonary/Chest: Effort normal and breath sounds normal. No respiratory distress. She has no wheezes. She has no rales. She exhibits no tenderness.  Abdominal: Soft. Bowel sounds are normal. She exhibits no distension. There is no tenderness.  Musculoskeletal: She exhibits no edema or tenderness.  Neurological: She is alert and oriented to person, place, and time. She exhibits normal muscle tone. Coordination normal.  Skin: Skin is warm and dry. No rash noted. She is not diaphoretic. No erythema. No pallor.  Psychiatric: She has a normal mood and affect. Her behavior is normal. Judgment normal.     Assessment & Plan:    HIV/AIDS: continue BIKTARVY. Vaccination for Hep A and B #2  Husband should have another oraquick test here in clinic one more time. He says he had HIV serum test done in June that was negative and they had no sex x 2 months prior to that test.  CMV esophagitis: resolved  Low vitamin D: continue low dose vitamin D  We spent greater than 25  minutes with the patient including greater than 50% of time in face to face counsel of the patient and her husband re her HIV/AIDS, CMV esophagitis,nature of HIV, nature of treatment for patient and treatment as prevention and in coordination of her care.

## 2016-08-16 DIAGNOSIS — Z23 Encounter for immunization: Secondary | ICD-10-CM | POA: Diagnosis not present

## 2016-08-16 DIAGNOSIS — B20 Human immunodeficiency virus [HIV] disease: Secondary | ICD-10-CM | POA: Diagnosis not present

## 2016-08-16 NOTE — Addendum Note (Signed)
Addended by: Alesia Morin F on: 08/16/2016 11:21 AM   Modules accepted: Orders

## 2016-08-30 MED FILL — BIKTARVY 50-200-25 MG TABS: 50-200-25 | 30 days supply | Qty: 30 | Fill #2

## 2016-09-23 MED FILL — BIKTARVY 50-200-25 MG TABS: 50-200-25 | 30 days supply | Qty: 30 | Fill #3

## 2016-10-10 DIAGNOSIS — M8580 Other specified disorders of bone density and structure, unspecified site: Secondary | ICD-10-CM | POA: Insufficient documentation

## 2016-10-18 MED FILL — BIKTARVY 50-200-25 MG TABS: 50-200-25 | 30 days supply | Qty: 30 | Fill #4

## 2016-11-13 MED FILL — BIKTARVY 50-200-25 MG TABS: 50-200-25 | 30 days supply | Qty: 30 | Fill #5

## 2016-11-16 ENCOUNTER — Other Ambulatory Visit: Payer: Self-pay | Admitting: Pharmacist

## 2016-11-26 ENCOUNTER — Other Ambulatory Visit (HOSPITAL_COMMUNITY)
Admission: RE | Admit: 2016-11-26 | Discharge: 2016-11-26 | Disposition: A | Discharge status: Home / Self Care | Payer: Managed Care, Other (non HMO) | Source: Ambulatory Visit | Attending: Infectious Disease | Admitting: Infectious Disease

## 2016-11-26 ENCOUNTER — Other Ambulatory Visit: Payer: Managed Care, Other (non HMO)

## 2016-11-26 DIAGNOSIS — B20 Human immunodeficiency virus [HIV] disease: Secondary | ICD-10-CM | POA: Diagnosis not present

## 2016-11-26 DIAGNOSIS — Z79899 Other long term (current) drug therapy: Secondary | ICD-10-CM

## 2016-11-26 LAB — CBC WITH DIFFERENTIAL/PLATELET
BASOS PCT: 1 %
Basophils Absolute: 86 cells/uL (ref 0–200)
EOS PCT: 4 %
Eosinophils Absolute: 344 cells/uL (ref 15–500)
HCT: 37.9 % (ref 35.0–45.0)
Hemoglobin: 12.5 g/dL (ref 11.7–15.5)
Lymphocytes Relative: 49 %
Lymphs Abs: 4214 cells/uL — ABNORMAL HIGH (ref 850–3900)
MCH: 30.7 pg (ref 27.0–33.0)
MCHC: 33 g/dL (ref 32.0–36.0)
MCV: 93.1 fL (ref 80.0–100.0)
MONOS PCT: 9 %
MPV: 9.5 fL (ref 7.5–12.5)
Monocytes Absolute: 774 cells/uL (ref 200–950)
Neutro Abs: 3182 cells/uL (ref 1500–7800)
Neutrophils Relative %: 37 %
PLATELETS: 281 10*3/uL (ref 140–400)
RBC: 4.07 MIL/uL (ref 3.80–5.10)
RDW: 15.6 % — ABNORMAL HIGH (ref 11.0–15.0)
WBC: 8.6 10*3/uL (ref 3.8–10.8)

## 2016-11-27 LAB — COMPLETE METABOLIC PANEL WITH GFR
ALT: 8 U/L (ref 6–29)
AST: 18 U/L (ref 10–35)
Albumin: 3.7 g/dL (ref 3.6–5.1)
Alkaline Phosphatase: 87 U/L (ref 33–130)
BUN: 14 mg/dL (ref 7–25)
CHLORIDE: 100 mmol/L (ref 98–110)
CO2: 27 mmol/L (ref 20–32)
CREATININE: 0.97 mg/dL (ref 0.50–1.05)
Calcium: 9.5 mg/dL (ref 8.6–10.4)
GFR, Est African American: 75 mL/min (ref >=60)
GFR, Est Non African American: 65 mL/min (ref >=60)
Glucose, Bld: 71 mg/dL (ref 65–99)
POTASSIUM: 4.5 mmol/L (ref 3.5–5.3)
Sodium: 138 mmol/L (ref 135–146)
Total Bilirubin: 0.3 mg/dL (ref 0.2–1.2)
Total Protein: 8.4 g/dL — ABNORMAL HIGH (ref 6.1–8.1)

## 2016-11-27 LAB — LIPID PANEL
CHOLESTEROL: 219 mg/dL — AB (ref <200)
HDL: 77 mg/dL (ref >50)
LDL Cholesterol: 117 mg/dL — ABNORMAL HIGH (ref <100)
Total CHOL/HDL Ratio: 2.8 Ratio (ref <5.0)
Triglycerides: 126 mg/dL (ref <150)
VLDL: 25 mg/dL (ref <30)

## 2016-11-27 LAB — RPR

## 2016-11-27 LAB — URINE CYTOLOGY ANCILLARY ONLY
Chlamydia: NEGATIVE
Neisseria Gonorrhea: NEGATIVE

## 2016-11-28 LAB — T-HELPER CELL (CD4) - (RCID CLINIC ONLY)
CD4 % Helper T Cell: 8 % — ABNORMAL LOW (ref 33–55)
CD4 T Cell Abs: 330 /uL — ABNORMAL LOW (ref 400–2700)

## 2016-11-28 LAB — HIV-1 RNA QUANT-NO REFLEX-BLD
HIV 1 RNA QUANT: NOT DETECTED {copies}/mL
HIV-1 RNA QUANT, LOG: NOT DETECTED {Log_copies}/mL

## 2016-12-07 MED FILL — BIKTARVY 50-200-25 MG TABS: 50-200-25 | 30 days supply | Qty: 30 | Fill #6

## 2016-12-10 ENCOUNTER — Ambulatory Visit (INDEPENDENT_AMBULATORY_CARE_PROVIDER_SITE_OTHER): Payer: Managed Care, Other (non HMO) | Admitting: Infectious Disease

## 2016-12-10 ENCOUNTER — Encounter: Payer: Self-pay | Admitting: Infectious Disease

## 2016-12-10 VITALS — BP 129/70 | HR 66 | Temp 98.4°F | Wt 154.0 lb

## 2016-12-10 DIAGNOSIS — Z79899 Other long term (current) drug therapy: Secondary | ICD-10-CM

## 2016-12-10 DIAGNOSIS — B20 Human immunodeficiency virus [HIV] disease: Secondary | ICD-10-CM | POA: Diagnosis not present

## 2016-12-10 DIAGNOSIS — Z113 Encounter for screening for infections with a predominantly sexual mode of transmission: Secondary | ICD-10-CM | POA: Diagnosis not present

## 2016-12-10 NOTE — Patient Instructions (Signed)
You need to make an appt with our NP Judeth Cornfield for a PAP smear and exam You also need to get your third Hep B and 2nd Hep A vaccine in September when you should also get Flu shot

## 2016-12-10 NOTE — Progress Notes (Signed)
Subjective:   Chief complaint: followup for her HIV   Patient ID: Samantha Kline, female    DOB: [birth year], 58 y.o.   MRN: [id number]  HPI  56 year old admitted with PCP pneumonia, newly diagnosed HIV/AIDS and found to have candida esophagitis, fevers, then CMV esophagitis.   She had protracted stay and eventually went to inpatient rehab.   She completed 21 days of GCV for her CMV esophagitis supported by CSF for part of her therapy. She did continue to have neutropenia, leukopenia after coming off the GCV and her G-CSF.    Her virus responded very nicely to Tivicay and Descovy and is Dillon Bjork  Lab Results  Component Value Date   HIV1RNAQUANT <20 NOT DETECTED 11/26/2016   HIV1RNAQUANT 50 (H) 08/01/2016   HIV1RNAQUANT <20 03/06/2016   Lab Results  Component Value Date   CD4TABS 330 (L) 11/26/2016   CD4TABS 490 08/01/2016   CD4TABS 260 (L) 06/21/2016         Past Medical History:  Diagnosis Date  . Anemia   . HIV (human immunodeficiency virus infection) (HCC)    11/06/15 Family states it was just diagnosed this week  . Pneumonia 10/24/2015  . Rash and nonspecific skin eruption 02/21/2016  . Vitamin D deficiency     Past Surgical History:  Procedure Laterality Date  . CLOSED REDUCTION FINGER WITH PERCUTANEOUS PINNING Left 12/18/2015   Procedure: Closed reduction of Left long finger PIP dislocation;  Surgeon: Dairl Ponder, MD;  Location: [facility] OR;  Service: Orthopedics;  Laterality: Left;  . ESOPHAGOGASTRODUODENOSCOPY N/A 11/23/2015   Procedure: ESOPHAGOGASTRODUODENOSCOPY (EGD);  Surgeon: Vida Rigger, MD;  Location: [facility] ENDOSCOPY;  Service: Endoscopy;  Laterality: N/A;  . TUBAL LIGATION  1980s    Family History  Problem Relation Age of Onset  . Diabetes Father   . Heart attack Brother   . Hypertension Sister       Social History   Social History  . Marital status: Married    Spouse name: N/A  . Number of children: N/A  . Years of education: N/A    Social History Main Topics  . Smoking status: Never Smoker  . Smokeless tobacco: Never Used  . Alcohol use No     Comment: 10/24/2015 "nothing since the 1980s"  . Drug use: No  . Sexual activity: Not Asked   Other Topics Concern  . None   Social History Narrative  . None    Allergies  Allergen Reactions  . Septra [Sulfamethoxazole-Trimethoprim] Anaphylaxis    Possible cause of hyponatremia Possible cause of hyponatremia     Current Outpatient Prescriptions:  .  bictegravir-emtricitabine-tenofovir AF (BIKTARVY) 50-200-25 MG TABS tablet, Take 1 tablet by mouth daily., Disp: 30 tablet, Rfl: 11 .  vitamin B-12 (CYANOCOBALAMIN) 100 MCG tablet, Take 1 tablet (100 mcg total) by mouth daily., Disp: 30 tablet, Rfl: 0 .  promethazine (PHENERGAN) 25 MG tablet, Take 25 mg by mouth every 6 (six) hours as needed for nausea or vomiting., Disp: , Rfl:    Review of Systems  Constitutional: Negative for chills, diaphoresis and fever.  HENT: Negative for congestion, hearing loss, sore throat and tinnitus.   Respiratory: Negative for cough, chest tightness, shortness of breath and wheezing.   Cardiovascular: Negative for chest pain, palpitations and leg swelling.  Gastrointestinal: Negative for abdominal pain, blood in stool, constipation, diarrhea, nausea and vomiting.  Genitourinary: Negative for dysuria, flank pain and hematuria.  Musculoskeletal: Negative for back pain and myalgias.  Skin: Negative  for rash.  Neurological: Negative for dizziness, weakness and headaches.  Hematological: Does not bruise/bleed easily.  Psychiatric/Behavioral: Negative for suicidal ideas. The patient is not nervous/anxious.        Objective:   Physical Exam  Constitutional: She is oriented to person, place, and time. No distress.  HENT:  Head: Normocephalic and atraumatic.  Mouth/Throat: Uvula is midline and oropharynx is clear and moist. No oropharyngeal exudate.  Eyes: Conjunctivae and EOM are  normal. No scleral icterus.  Neck: Normal range of motion. Neck supple.  Cardiovascular: Normal rate, regular rhythm and normal heart sounds.   Pulmonary/Chest: Effort normal and breath sounds normal. She has no wheezes.  Abdominal: Soft. Bowel sounds are normal. She exhibits no distension. There is no tenderness.  Musculoskeletal: She exhibits no edema or tenderness.  Neurological: She is alert and oriented to person, place, and time. She exhibits normal muscle tone. Coordination normal.  Skin: Skin is warm and dry. No rash noted. She is not diaphoretic. No erythema. No pallor.  Psychiatric: She has a normal mood and affect. Her behavior is normal. Judgment normal.     Assessment & Plan:    HIV/AIDS: continue BIKTARVY. Vaccination for Hep A and B final in September  Husband tested HIV -3 months after last reported unprotected sexual or course  CMV esophagitis: resolved  Low vitamin D: continue low dose vitamin D

## 2017-01-01 MED FILL — BIKTARVY 50-200-25 MG TABS: 50-200-25 | 30 days supply | Qty: 30 | Fill #7

## 2017-01-28 MED FILL — BIKTARVY 50-200-25 MG TABS: 50-200-25 | 30 days supply | Qty: 30 | Fill #8

## 2017-02-04 ENCOUNTER — Encounter: Payer: Self-pay | Admitting: Infectious Diseases

## 2017-02-04 ENCOUNTER — Other Ambulatory Visit (HOSPITAL_COMMUNITY)
Admission: RE | Admit: 2017-02-04 | Discharge: 2017-02-04 | Disposition: A | Discharge status: Home / Self Care | Payer: Managed Care, Other (non HMO) | Source: Ambulatory Visit | Attending: Infectious Diseases | Admitting: Infectious Diseases

## 2017-02-04 ENCOUNTER — Ambulatory Visit (INDEPENDENT_AMBULATORY_CARE_PROVIDER_SITE_OTHER): Payer: Managed Care, Other (non HMO) | Admitting: Infectious Diseases

## 2017-02-04 VITALS — BP 168/88 | HR 63 | Temp 98.7°F | Wt 159.0 lb

## 2017-02-04 DIAGNOSIS — Z23 Encounter for immunization: Secondary | ICD-10-CM

## 2017-02-04 DIAGNOSIS — Z124 Encounter for screening for malignant neoplasm of cervix: Secondary | ICD-10-CM | POA: Insufficient documentation

## 2017-02-04 DIAGNOSIS — B20 Human immunodeficiency virus [HIV] disease: Secondary | ICD-10-CM

## 2017-02-04 DIAGNOSIS — Z0184 Encounter for antibody response examination: Secondary | ICD-10-CM

## 2017-02-04 NOTE — Progress Notes (Signed)
     Subjective:    Samantha Kline is a 56 y.o. female here for an annual pelvic exam and pap smear.  No LMP recorded. Patient is postmenopausal. She is a patient of Dr. Zenaida Niece Kline's and currently taking her Biktarvy daily.   Current GYN complaints or concerns: none.   Past Medical History:  Diagnosis Date  . Anemia   . HIV (human immunodeficiency virus infection) (HCC)    11/06/15 Family states it was just diagnosed this week  . Pneumonia 10/24/2015  . Rash and nonspecific skin eruption 02/21/2016  . Vitamin D deficiency     Gynecologic History No LMP recorded. Patient is postmenopausal. Contraception: none Last Pap: none on file - reports normal paps in past  Last Mammogram: 2018. Results were: normal. Performs self breast exams monthly.    Review of Systems Patient denies any abdominal/pelvic pain, problems with bowel movements, urination, vaginal discharge or intercourse.  Objective:  BP (!) 168/88   Pulse 63   Temp 98.7 F (37.1 C) (Oral)   Wt 159 lb (72.1 kg)   BMI 28.17 kg/m  Physical Exam  Constitutional: Well developed, well nourished, no acute distress. She is alert and oriented x3.  Pelvic: External genitalia is normal in appearance. The vagina is normal in appearance. The cervix is bulbous and posteriorly oriented but visualized without much difficulty. No CMT, normal expected cervical mucus present. Bimanual exam reveals uterus that is felt to be normal size, shape, and contour. No adnexal masses or tenderness noted.  Psych: She has a normal mood and affect.    Deferred breast exam today.   Assessment:  Normal pelvic exam. Thin prep pap was obtained and sent for cytology, with reflex HPV and STI testing.    Plan:  Health Maintenance = Return in 1 year for annual pap screening unless indicated sooner.   Vaccines = will administer flu, pneumovax and HepB#3 today. Per chart it appears she completed #1 and #2 HepA although only 1 month apart? Can FU immunity with  next lab draw.   HIV = F/U as scheduled with Dr. Daiva Kline for ongoing HIV care.   Samantha Alberts, MSN, NP-C [facility] 02/04/2017 4:44 PM

## 2017-02-06 LAB — CYTOLOGY - PAP
Bacterial vaginitis: NEGATIVE
CHLAMYDIA, DNA PROBE: NEGATIVE
Candida vaginitis: NEGATIVE
DIAGNOSIS: NEGATIVE
NEISSERIA GONORRHEA: NEGATIVE

## 2017-02-07 ENCOUNTER — Encounter: Payer: Self-pay | Admitting: Infectious Diseases

## 2017-02-07 ENCOUNTER — Other Ambulatory Visit: Payer: Self-pay | Admitting: Pharmacist

## 2017-03-01 MED FILL — BIKTARVY 50-200-25 MG TABS: 50-200-25 | 30 days supply | Qty: 30 | Fill #9

## 2017-03-28 MED FILL — BIKTARVY 50-200-25 MG TABS: 50-200-25 | 30 days supply | Qty: 30 | Fill #10

## 2017-04-24 MED FILL — BIKTARVY 50-200-25 MG TABS: 50-200-25 | 30 days supply | Qty: 30 | Fill #0

## 2017-05-03 ENCOUNTER — Other Ambulatory Visit: Payer: Self-pay | Admitting: Pharmacist

## 2017-05-27 MED FILL — BIKTARVY 50-200-25 MG TABS: 50-200-25 | 30 days supply | Qty: 30 | Fill #1

## 2017-05-29 ENCOUNTER — Other Ambulatory Visit: Payer: Managed Care, Other (non HMO)

## 2017-05-29 ENCOUNTER — Other Ambulatory Visit (HOSPITAL_COMMUNITY)
Admission: RE | Admit: 2017-05-29 | Discharge: 2017-05-29 | Disposition: A | Discharge status: Home / Self Care | Payer: Managed Care, Other (non HMO) | Source: Ambulatory Visit | Attending: Infectious Disease | Admitting: Infectious Disease

## 2017-05-29 DIAGNOSIS — B20 Human immunodeficiency virus [HIV] disease: Secondary | ICD-10-CM

## 2017-05-29 DIAGNOSIS — Z79899 Other long term (current) drug therapy: Secondary | ICD-10-CM | POA: Insufficient documentation

## 2017-05-29 DIAGNOSIS — Z113 Encounter for screening for infections with a predominantly sexual mode of transmission: Secondary | ICD-10-CM

## 2017-05-29 DIAGNOSIS — Z0184 Encounter for antibody response examination: Secondary | ICD-10-CM

## 2017-05-29 NOTE — Addendum Note (Signed)
Addended by: ABBITT, KATRINA F on: 05/29/2017 08:38 AM ° ° Modules accepted: Orders ° °

## 2017-05-29 NOTE — Addendum Note (Signed)
Addended byDoristine Devoid: ABBITT, KATRINA F on: 05/29/2017 08:38 AM   Modules accepted: Orders

## 2017-05-30 LAB — CBC WITH DIFFERENTIAL/PLATELET
BASOS PCT: 0.6 %
Basophils Absolute: 52 cells/uL (ref 0–200)
EOS PCT: 2.4 %
Eosinophils Absolute: 209 cells/uL (ref 15–500)
HCT: 35.6 % (ref 35.0–45.0)
HEMOGLOBIN: 12.3 g/dL (ref 11.7–15.5)
Lymphs Abs: 4333 cells/uL — ABNORMAL HIGH (ref 850–3900)
MCH: 31.1 pg (ref 27.0–33.0)
MCHC: 34.6 g/dL (ref 32.0–36.0)
MCV: 89.9 fL (ref 80.0–100.0)
MONOS PCT: 8.1 %
MPV: 10.7 fL (ref 7.5–12.5)
NEUTROS ABS: 3402 {cells}/uL (ref 1500–7800)
Neutrophils Relative %: 39.1 %
Platelets: 278 10*3/uL (ref 140–400)
RBC: 3.96 10*6/uL (ref 3.80–5.10)
RDW: 13.9 % (ref 11.0–15.0)
Total Lymphocyte: 49.8 %
WBC mixed population: 705 cells/uL (ref 200–950)
WBC: 8.7 10*3/uL (ref 3.8–10.8)

## 2017-05-30 LAB — COMPLETE METABOLIC PANEL WITH GFR
AG Ratio: 0.9 (calc) — ABNORMAL LOW (ref 1.0–2.5)
ALBUMIN MSPROF: 4 g/dL (ref 3.6–5.1)
ALT: 7 U/L (ref 6–29)
AST: 19 U/L (ref 10–35)
Alkaline phosphatase (APISO): 89 U/L (ref 33–130)
BUN: 15 mg/dL (ref 7–25)
CO2: 27 mmol/L (ref 20–32)
CREATININE: 0.94 mg/dL (ref 0.50–1.05)
Calcium: 9.3 mg/dL (ref 8.6–10.4)
Chloride: 101 mmol/L (ref 98–110)
GFR, Est African American: 78 mL/min/{1.73_m2} (ref >=60)
GFR, Est Non African American: 67 mL/min/{1.73_m2} (ref >=60)
GLUCOSE: 86 mg/dL (ref 65–99)
Globulin: 4.6 g/dL (calc) — ABNORMAL HIGH (ref 1.9–3.7)
Potassium: 4.6 mmol/L (ref 3.5–5.3)
Sodium: 137 mmol/L (ref 135–146)
TOTAL PROTEIN: 8.6 g/dL — AB (ref 6.1–8.1)
Total Bilirubin: 0.4 mg/dL (ref 0.2–1.2)

## 2017-05-30 LAB — LIPID PANEL
CHOL/HDL RATIO: 2.6 (calc) (ref <5.0)
CHOLESTEROL: 218 mg/dL — AB (ref <200)
HDL: 83 mg/dL (ref >50)
LDL CHOLESTEROL (CALC): 118 mg/dL — AB
Non-HDL Cholesterol (Calc): 135 mg/dL (calc) — ABNORMAL HIGH (ref <130)
Triglycerides: 73 mg/dL (ref <150)

## 2017-05-30 LAB — T-HELPER CELL (CD4) - (RCID CLINIC ONLY)
CD4 % Helper T Cell: 10 % — ABNORMAL LOW (ref 33–55)
CD4 T CELL ABS: 470 /uL (ref 400–2700)

## 2017-05-30 LAB — RPR: RPR: NONREACTIVE

## 2017-05-30 LAB — URINE CYTOLOGY ANCILLARY ONLY
Chlamydia: NEGATIVE
Neisseria Gonorrhea: NEGATIVE

## 2017-05-30 LAB — HEPATITIS A ANTIBODY, TOTAL: HEPATITIS A AB,TOTAL: REACTIVE — AB

## 2017-05-31 LAB — HIV-1 RNA QUANT-NO REFLEX-BLD
HIV 1 RNA Quant: <20 copies/mL
HIV-1 RNA Quant, Log: <1.3 Log copies/mL

## 2017-06-12 ENCOUNTER — Ambulatory Visit: Payer: Managed Care, Other (non HMO) | Admitting: Infectious Disease

## 2017-06-12 ENCOUNTER — Encounter: Payer: Self-pay | Admitting: Infectious Disease

## 2017-06-12 VITALS — BP 133/76 | HR 58 | Temp 98.1°F | Ht 65.0 in | Wt 146.0 lb

## 2017-06-12 DIAGNOSIS — B20 Human immunodeficiency virus [HIV] disease: Secondary | ICD-10-CM | POA: Diagnosis not present

## 2017-06-12 DIAGNOSIS — Z79899 Other long term (current) drug therapy: Secondary | ICD-10-CM

## 2017-06-12 DIAGNOSIS — Z113 Encounter for screening for infections with a predominantly sexual mode of transmission: Secondary | ICD-10-CM

## 2017-06-12 MED ORDER — BICTEGRAVIR-EMTRICITAB-TENOFOV 50-200-25 MG PO TABS: 1.0000 | ORAL_TABLET | Freq: Every day | ORAL | 11 refills | Status: DC

## 2017-06-12 NOTE — Patient Instructions (Signed)
Great job taking your medications and taking care of yourself!  You can followup with me in a year with labs before visit  Make sure in the fall that you come in September to get your flu shot with an RN visit

## 2017-06-12 NOTE — Progress Notes (Signed)
Subjective:   Chief complaint: followup for her HIV   Patient ID: Samantha Kline, female    DOB: 1961/04/18, 57 y.o.   MRN: [id number]  HPI  57 year old admitted with PCP pneumonia, newly diagnosed HIV/AIDS and found to have candida esophagitis, fevers, then CMV esophagitis in July of 2017.  She had protracted stay and eventually went to inpatient rehab. She was seen several times by Palliative care.   She completed 21 days of GCV for her CMV esophagitis supported by CSF for part of her therapy. She did continue to have neutropenia, leukopenia after coming off the GCV and her G-CSF.    Her virus responded very nicely to Tivicay and Descovy and now  BIKTARVY.  She has been wonderfully adherent and maintained perfect virological suppression. CD4 has now risen above 400.  She is here again for followup with her husband who has tested negative for HIV repeatedly.  Lab Results  Component Value Date   HIV1RNAQUANT <20 NOT DETECTED 05/29/2017   HIV1RNAQUANT <20 NOT DETECTED 11/26/2016   HIV1RNAQUANT 50 (H) 08/01/2016   Lab Results  Component Value Date   CD4TABS 470 05/29/2017   CD4TABS 330 (L) 11/26/2016   CD4TABS 490 08/01/2016    She is recovering from what sounds like a viral URI.     Past Medical History:  Diagnosis Date  . Anemia   . HIV (human immunodeficiency virus infection) (HCC)    11/06/15 Family states it was just diagnosed this week  . Pneumonia 10/24/2015  . Rash and nonspecific skin eruption 02/21/2016  . Vitamin D deficiency     Past Surgical History:  Procedure Laterality Date  . CLOSED REDUCTION FINGER WITH PERCUTANEOUS PINNING Left 12/18/2015   Procedure: Closed reduction of Left long finger PIP dislocation;  Surgeon: Dairl PonderMatthew Weingold, MD;  [facility];  Service: Orthopedics;  Laterality: Left;  . ESOPHAGOGASTRODUODENOSCOPY N/A 11/23/2015   Procedure: ESOPHAGOGASTRODUODENOSCOPY (EGD);  Surgeon: Vida RiggerMarc Magod, MD;  Location: [facility] ENDOSCOPY;  Service:  Endoscopy;  Laterality: N/A;  . TUBAL LIGATION  1980s    Family History  Problem Relation Age of Onset  . Diabetes Father   . Heart attack Brother   . Hypertension Sister       Social History   Socioeconomic History  . Marital status: Married    Spouse name: None  . Number of children: None  . Years of education: None  . Highest education level: None  Social Needs  . Financial resource strain: None  . Food insecurity - worry: None  . Food insecurity - inability: None  . Transportation needs - medical: None  . Transportation needs - non-medical: None  Occupational History  . None  Tobacco Use  . Smoking status: Never Smoker  . Smokeless tobacco: Never Used  Substance and Sexual Activity  . Alcohol use: No    Comment: 10/24/2015 "nothing since the 1980s"  . Drug use: No  . Sexual activity: None  Other Topics Concern  . None  Social History Narrative  . None    Allergies  Allergen Reactions  . Septra [Sulfamethoxazole-Trimethoprim] Anaphylaxis    Possible cause of hyponatremia Possible cause of hyponatremia     Current Outpatient Medications:  .  bictegravir-emtricitabine-tenofovir AF (BIKTARVY) 50-200-25 MG TABS tablet, Take 1 tablet by mouth daily., Disp: 30 tablet, Rfl: 11 .  promethazine (PHENERGAN) 25 MG tablet, Take 25 mg by mouth every 6 (six) hours as needed for nausea or vomiting., Disp: , Rfl:  .  vitamin B-12 (CYANOCOBALAMIN) 100 MCG tablet, Take 1 tablet (100 mcg total) by mouth daily., Disp: 30 tablet, Rfl: 0   Review of Systems  Constitutional: Positive for chills and fever. Negative for diaphoresis.  HENT: Positive for congestion. Negative for hearing loss, sore throat and tinnitus.   Respiratory: Negative for cough, chest tightness, shortness of breath and wheezing.   Cardiovascular: Negative for chest pain, palpitations and leg swelling.  Gastrointestinal: Negative for abdominal pain, blood in stool, constipation, diarrhea, nausea and vomiting.   Genitourinary: Negative for dysuria, flank pain and hematuria.  Musculoskeletal: Negative for back pain and myalgias.  Skin: Negative for rash.  Neurological: Negative for dizziness, weakness and headaches.  Hematological: Does not bruise/bleed easily.  Psychiatric/Behavioral: Negative for suicidal ideas. The patient is not nervous/anxious.        Objective:   Physical Exam  Constitutional: She is oriented to person, place, and time. She appears well-developed and well-nourished. No distress.  HENT:  Head: Normocephalic and atraumatic.  Mouth/Throat: Uvula is midline and oropharynx is clear and moist. No oropharyngeal exudate.  Eyes: Conjunctivae and EOM are normal. No scleral icterus.  Neck: Normal range of motion. Neck supple.  Cardiovascular: Normal rate and regular rhythm.  Pulmonary/Chest: Breath sounds normal. She has no wheezes.  Abdominal: Soft. She exhibits no distension.  Musculoskeletal: She exhibits no edema or tenderness.  Neurological: She is alert and oriented to person, place, and time. She exhibits normal muscle tone. Coordination normal.  Skin: Skin is warm and dry. No rash noted. She is not diaphoretic. No erythema. No pallor.  Psychiatric: She has a normal mood and affect. Her speech is normal and behavior is normal. Judgment and thought content normal. Cognition and memory are normal.  Nursing note and vitals reviewed.    Assessment & Plan:    HIV/AIDS: continue BIKTARVY. 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 She an follouwup in one year with me  CMV esophagitis: resolved  B12 deficiency and Vitamin D deficiency: continue these and if taking MVI or cation containing compoiunds separate out by 12 hours as she is already doing with vitamin B12 and D  I spent greater than 25 minutes with the patient including greater than 50% of time in face to face counsel of the patient and her husband re how well she was doing in  keeping her HIV under complete control and ensuring her own health and longevity and quality of life and ensuring that her husband cannot get HIV from here "U= U" and in coordination of her care.

## 2017-06-27 MED FILL — BIKTARVY 50-200-25 MG TABS: 50-200-25 | 30 days supply | Qty: 30 | Fill #11

## 2017-07-29 MED FILL — BIKTARVY 50-200-25 MG TABS: 50-200-25 | 30 days supply | Qty: 30 | Fill #2

## 2017-08-26 ENCOUNTER — Other Ambulatory Visit: Payer: Self-pay | Admitting: Infectious Disease

## 2017-08-26 DIAGNOSIS — B20 Human immunodeficiency virus [HIV] disease: Secondary | ICD-10-CM

## 2017-08-27 MED FILL — BIKTARVY 50-200-25 MG TABS: 50-200-25 | 30 days supply | Qty: 30 | Fill #0

## 2017-09-25 MED FILL — BIKTARVY 50-200-25 MG TABS: 50-200-25 | 30 days supply | Qty: 30 | Fill #1

## 2017-10-22 MED FILL — BIKTARVY 50-200-25 MG TABS: 50-200-25 | 30 days supply | Qty: 30 | Fill #2

## 2017-11-21 IMAGING — DX DG CHEST 2V
2 series · 2 of 2 positions shown · non-contrast
Comparison: CTA of the chest performed 08/10/2015, and chest
radiograph performed 08/09/2015

CLINICAL DATA: Subacute onset of generalized weakness.
Incontinence. Initial encounter.

EXAM:
CHEST  2 VIEW

[chest lat]
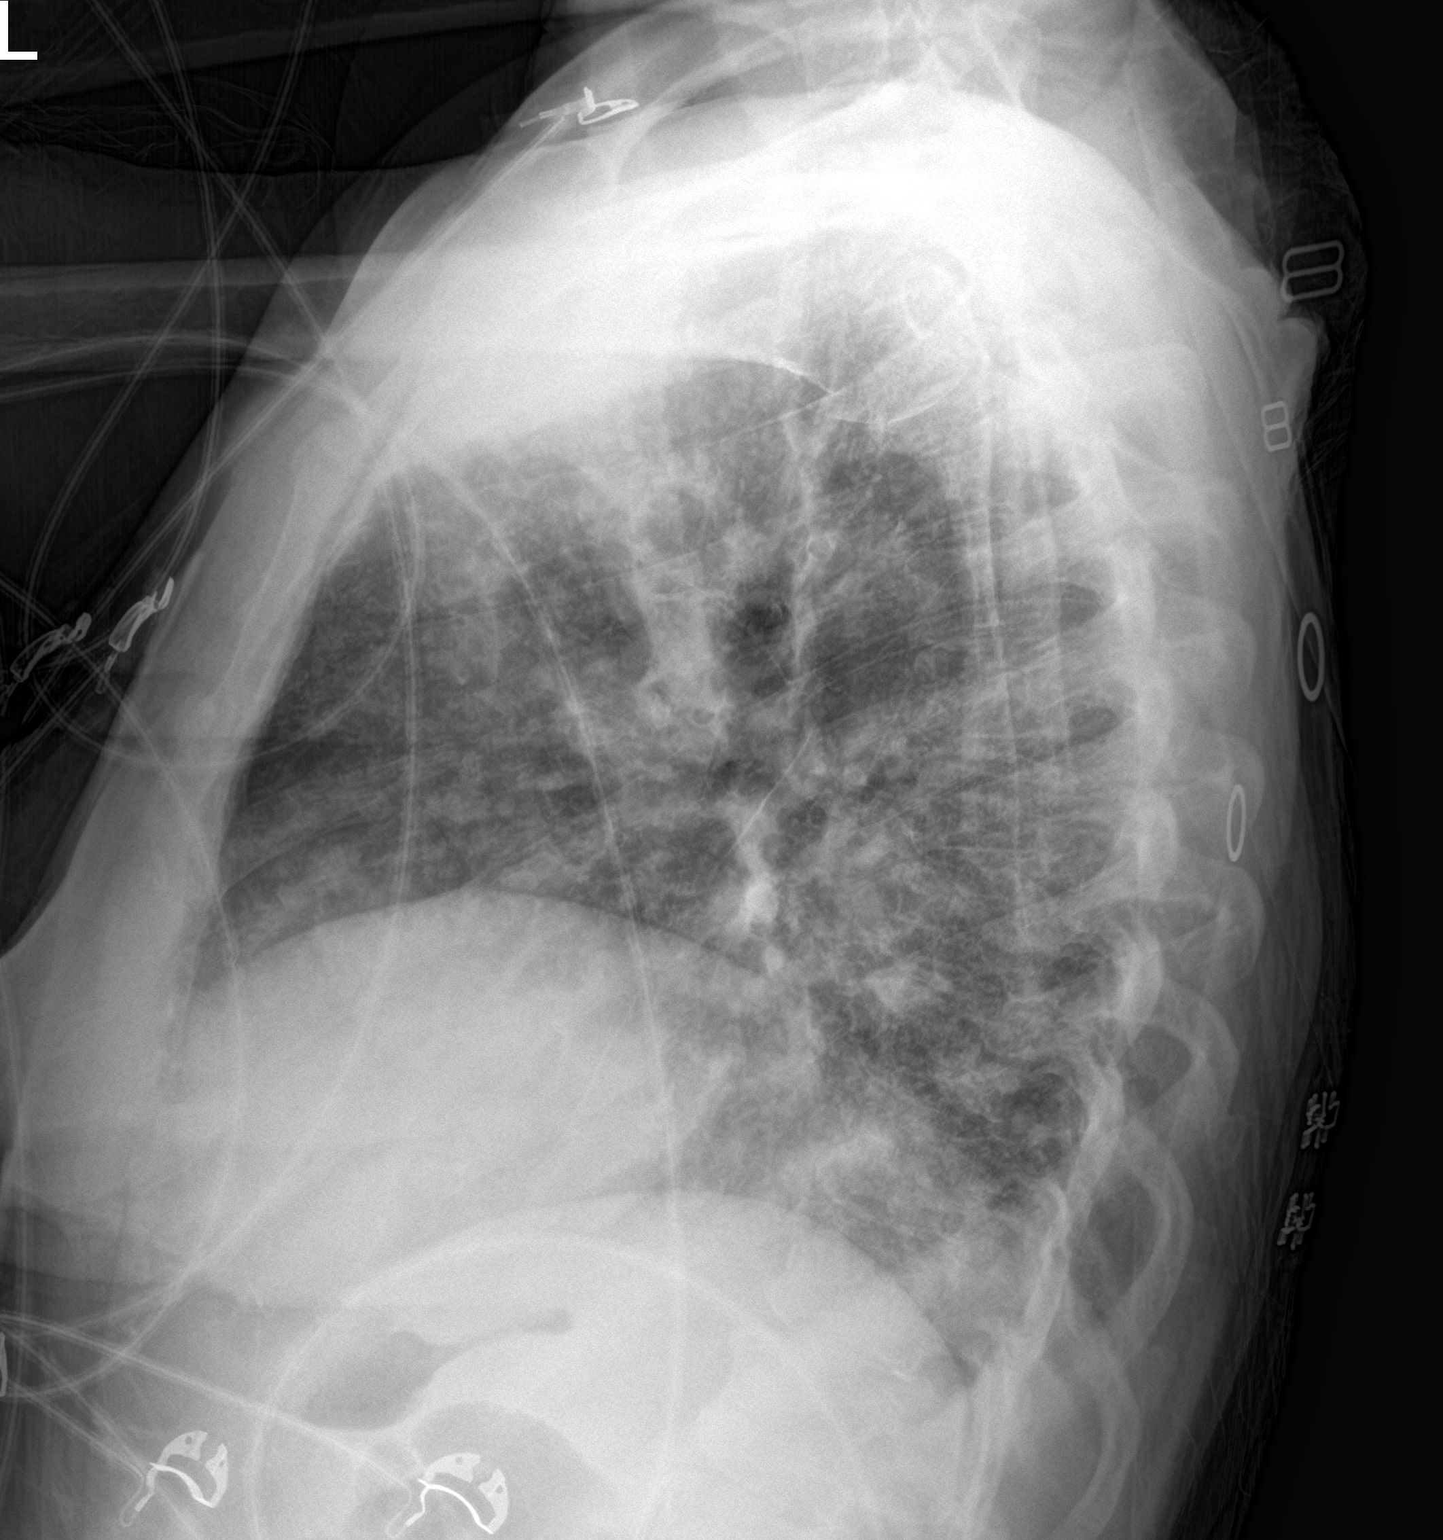

[chest ap]
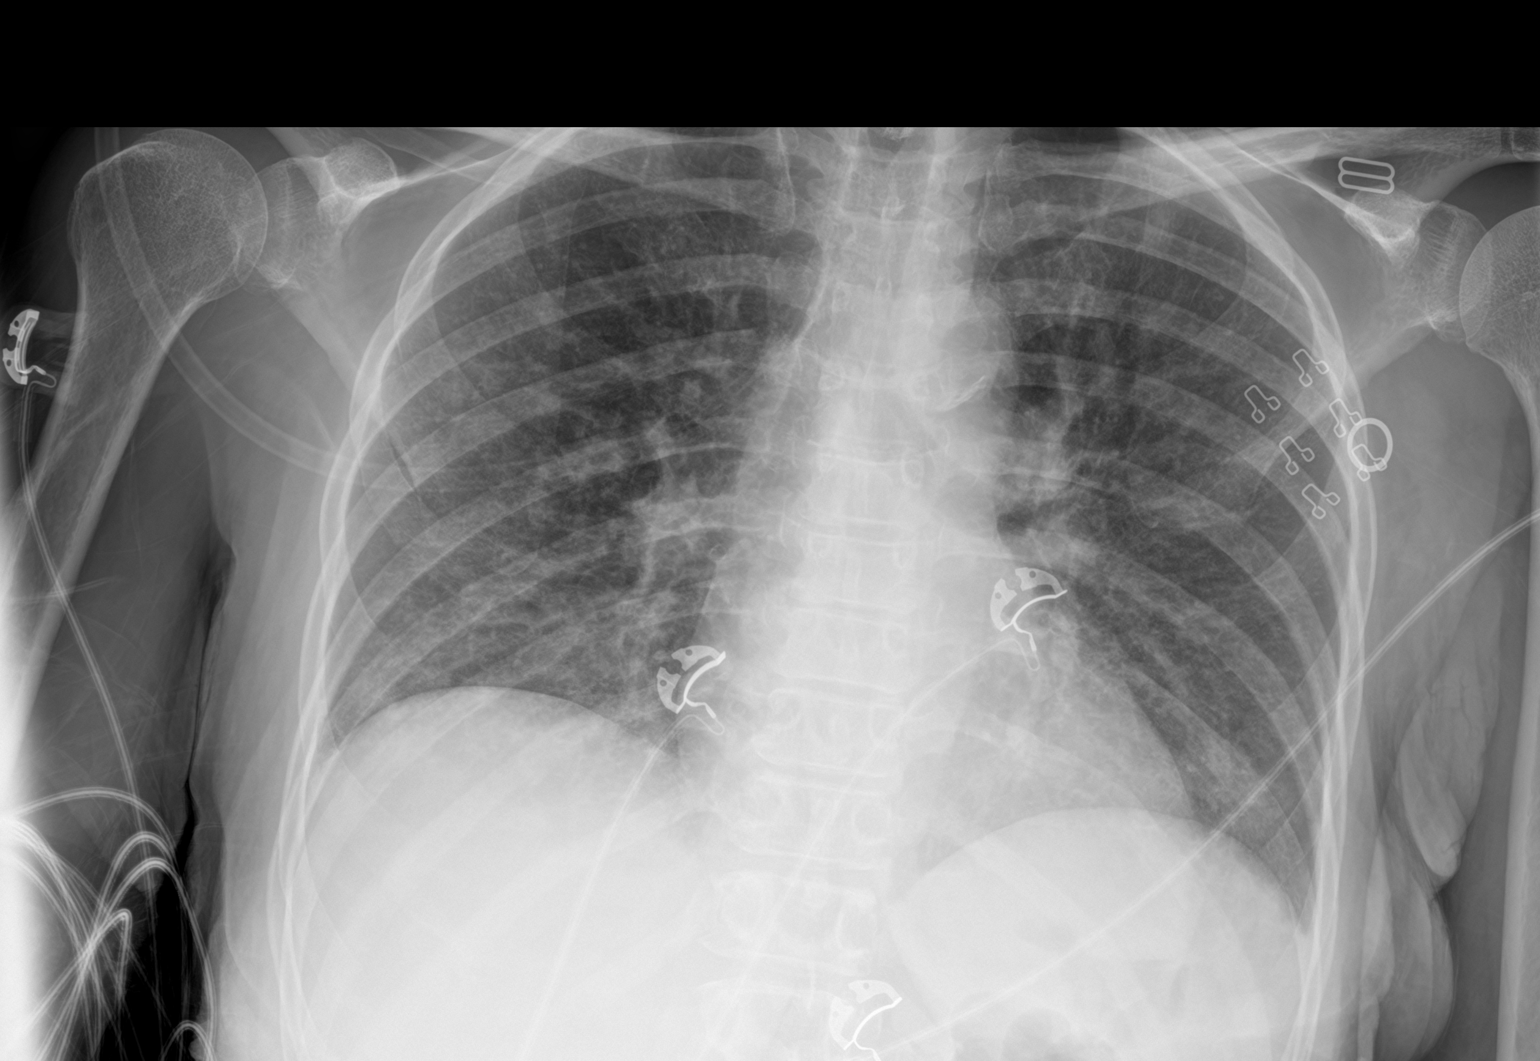

[2 of 2 positions shown; findings below may reference images not displayed]

FINDINGS: The lungs are well-aerated. Hazy bilateral airspace opacification
may reflect persistent or recurrent ground-glass opacification as
noted on prior CTA, which could be seen with pulmonary edema,
pneumonitis or possibly pulmonary alveolar hemorrhage. There is no
evidence of pleural effusion or pneumothorax.

The heart is normal in size; the mediastinal contour is within
normal limits. No acute osseous abnormalities are seen.
IMPRESSION: Hazy bilateral airspace opacification may reflect persistent or
recurrent ground-glass opacification as noted on prior CTA, which
could be seen with pulmonary edema, pneumonitis or possibly
pulmonary alveolar hemorrhage.

## 2017-11-26 MED FILL — BIKTARVY 50-200-25 MG TABS: 50-200-25 | 30 days supply | Qty: 30 | Fill #3

## 2017-12-04 IMAGING — CT CT HEAD W/O CM
3 of 4 series · 16 of 47 positions shown, 19 images · non-contrast
Comparison: None.

CLINICAL DATA: Altered mental status.  Nausea and vomiting.

EXAM:
CT HEAD WITHOUT CONTRAST
TECHNIQUE: Contiguous axial images were obtained from the base of the skull
through the vertex without intravenous contrast.

[Series 4: head 2.0 h70h · axial · 0.43mm/px · z∈[+96,+236]mm · 10 of 78 slices shown, 13 images]
[im 4/78  brain]
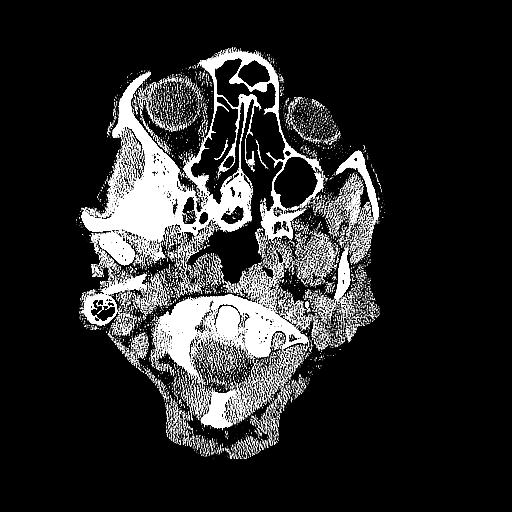
[im 4/78  bone]
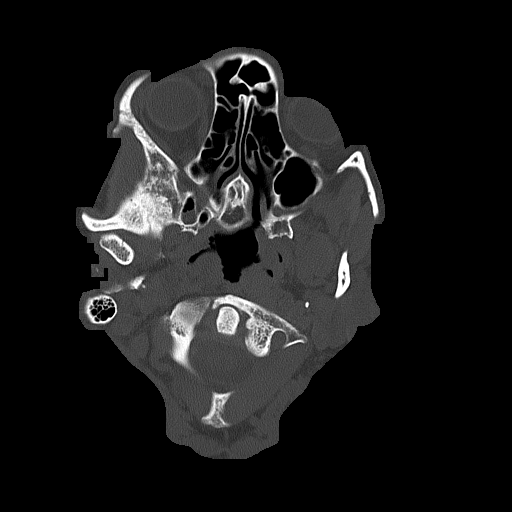
[im 12/78  brain]
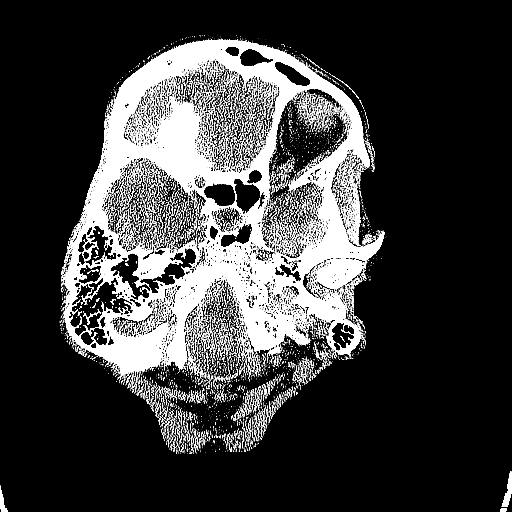
[im 20/78  brain]
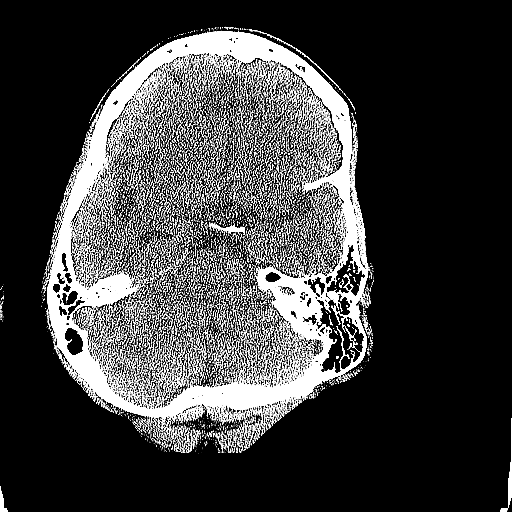
[im 27/78  brain]
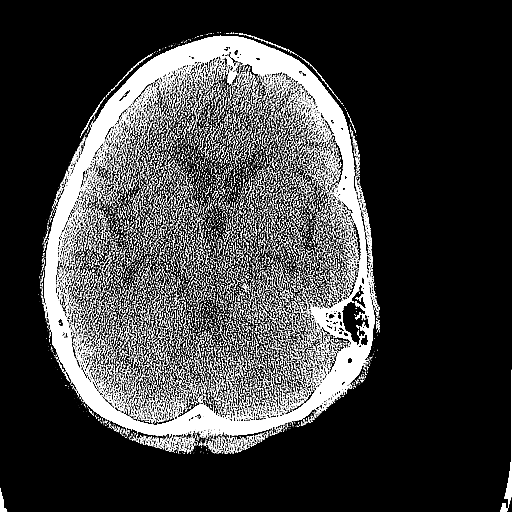
[im 35/78  brain]
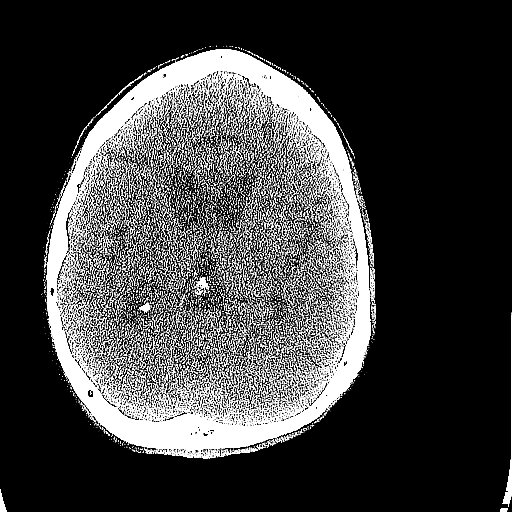
[im 35/78  bone]
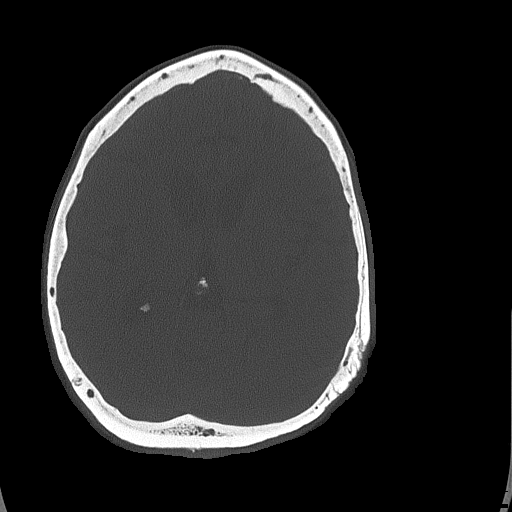
[im 43/78  brain]
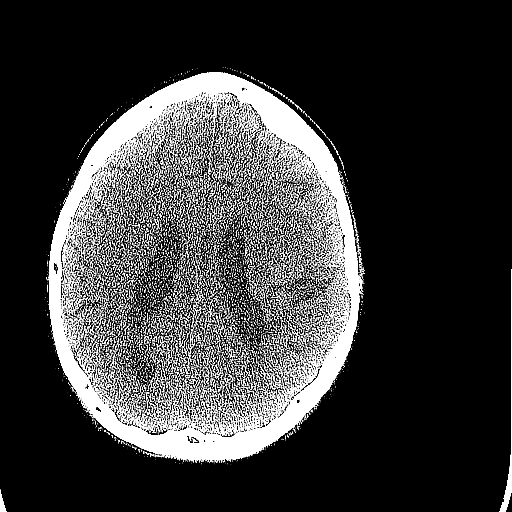
[im 51/78  brain]
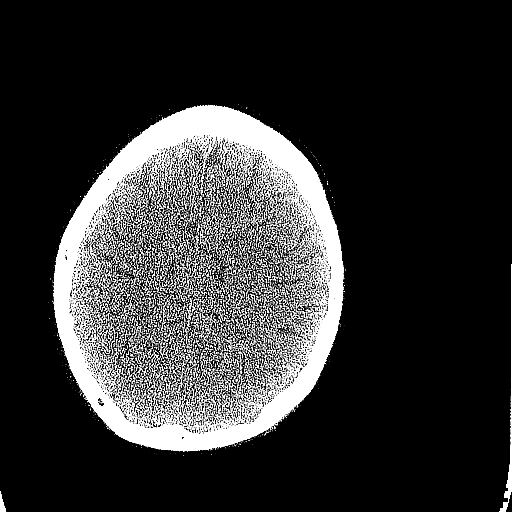
[im 58/78  brain]
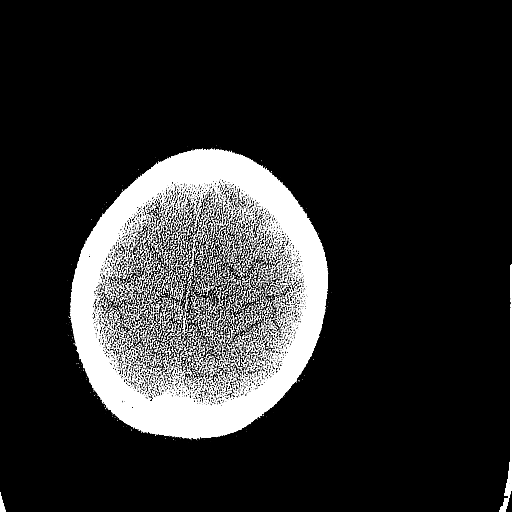
[im 66/78  brain]
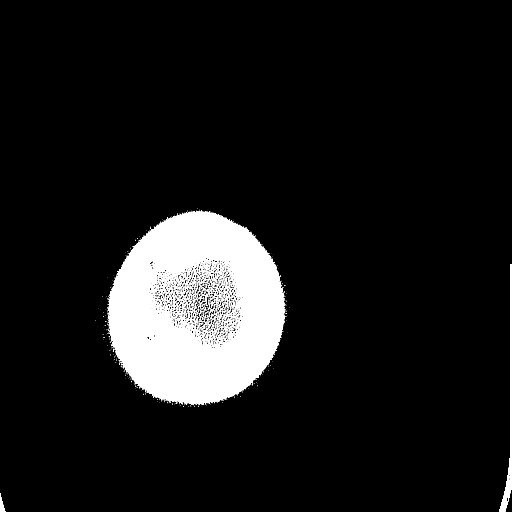
[im 66/78  bone]
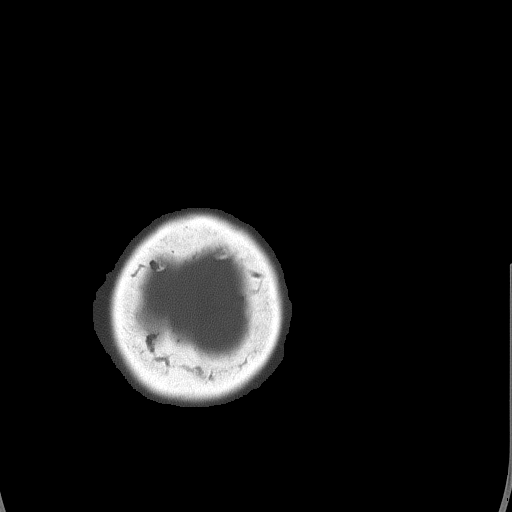
[im 74/78  brain]
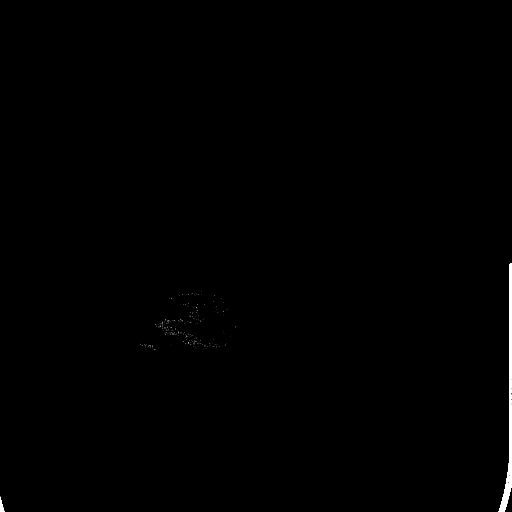

[Series 5: head 3.0 mpr · coronal · 0.31mm/px · 3 of 67 slices shown (1 of 2)]
[im 23/67  brain]
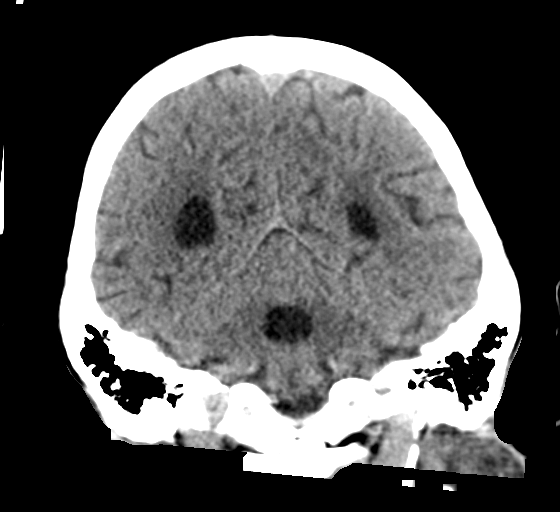
[im 30/67  brain]
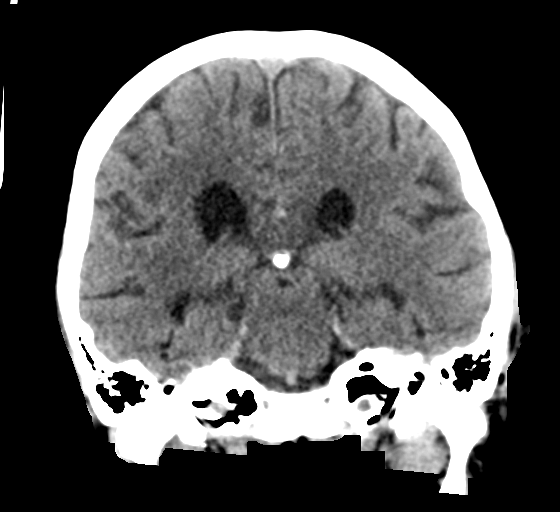
[im 37/67  brain]
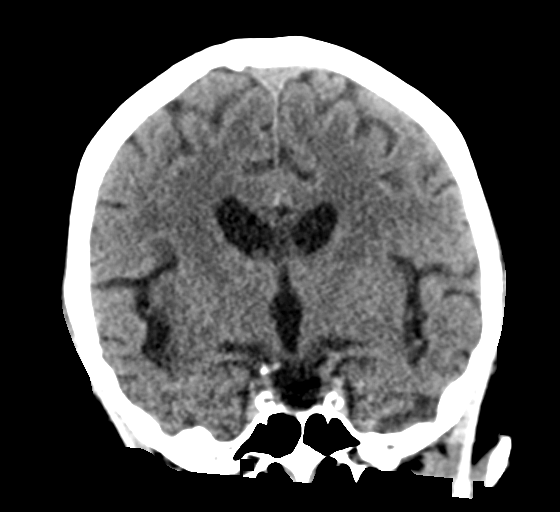

[Series 6: head 3.0 mpr · sagittal · 0.34mm/px · 3 of 54 slices shown (2 of 2)]
[im 19/54  brain]
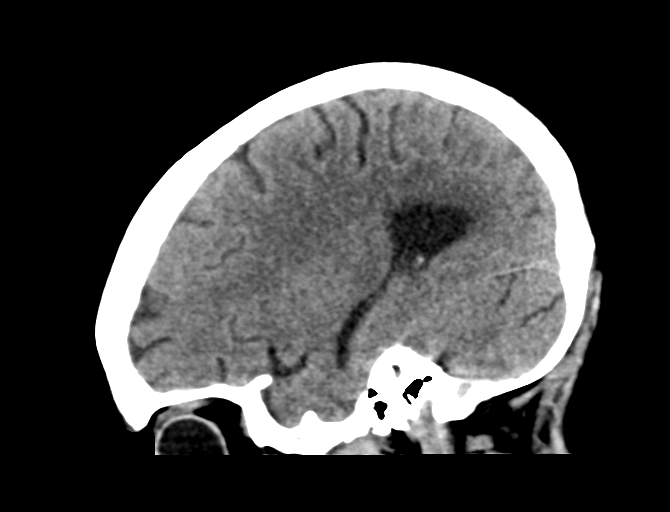
[im 27/54  brain]
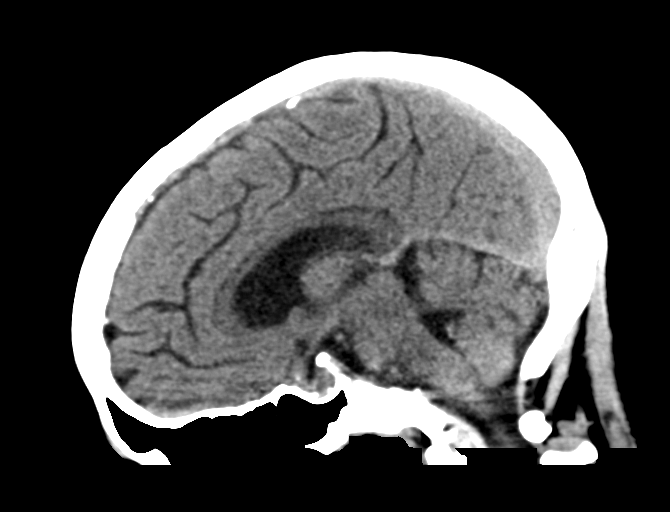
[im 35/54  brain]
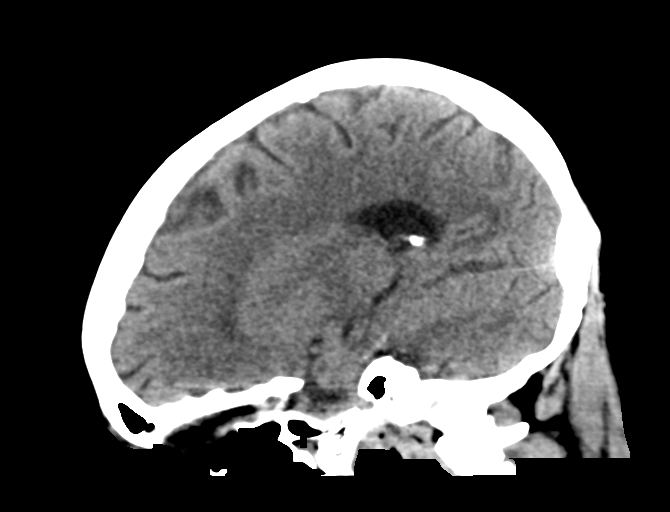

[16 of 47 positions shown; findings below may reference images not displayed]

FINDINGS: There is mild debris in the sphenoid sinuses. Paranasal sinuses,
mastoid air cells, and middle ears are otherwise unremarkable. No
acute bony abnormalities. Extracranial soft tissues are within
normal limits. No subdural, epidural, or subarachnoid hemorrhage.
The ventricles and sulci are unremarkable for age. The cerebellum,
brainstem, and basal cisterns are normal. No acute cortical ischemia
or infarct. No mass, mass effect, or midline shift.
IMPRESSION: No acute intracranial process.

## 2017-12-08 IMAGING — RF DG SWALLOWING FUNCTION - NRPT [facility]
1 series · 18 of 24 positions shown · non-contrast
Comparison: none

[Series 1: run · 14 acquisitions, 18 frames shown]
[im 1/14]
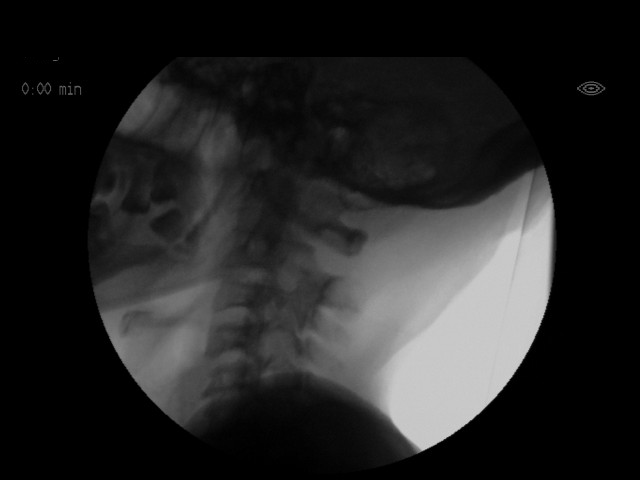
[im 2/14]
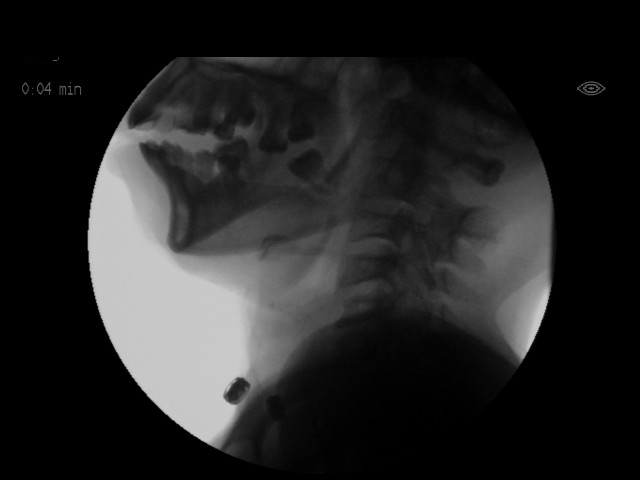
[im 2/14]
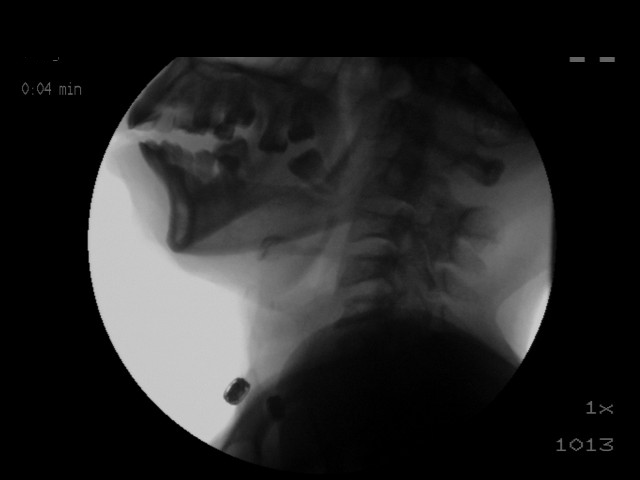
[im 3/14]
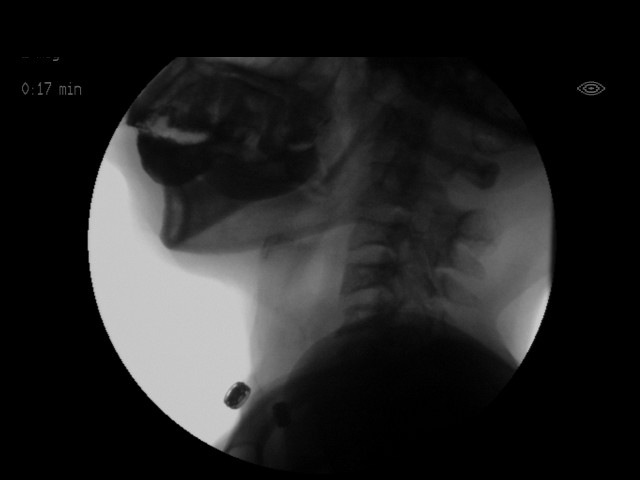
[im 4/14]
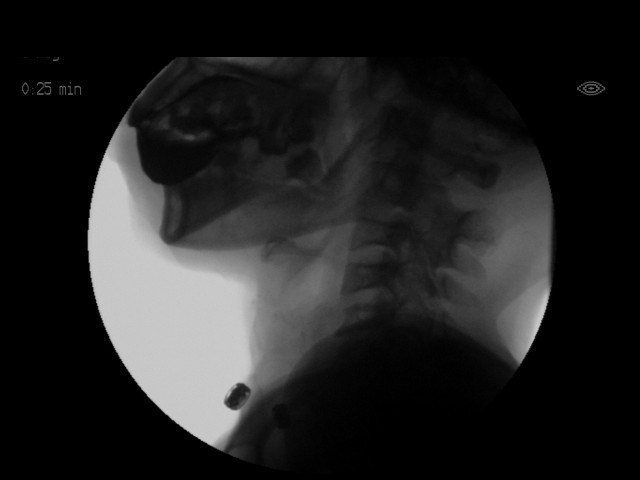
[im 5/14]
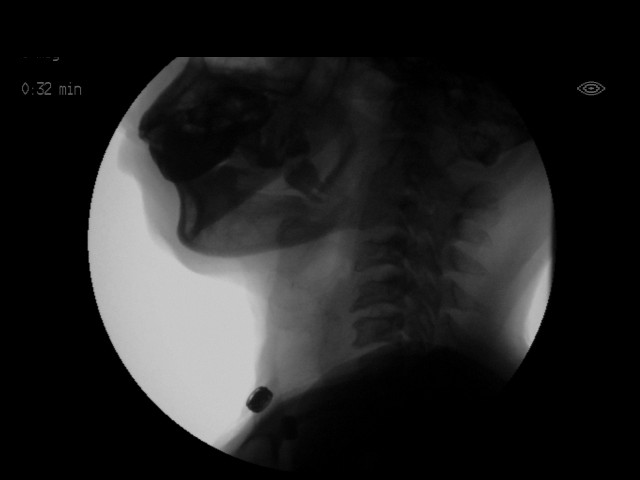
[im 5/14]
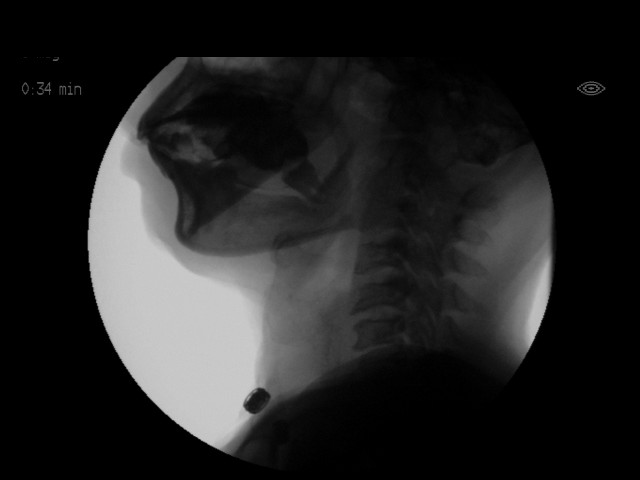
[im 7/14]
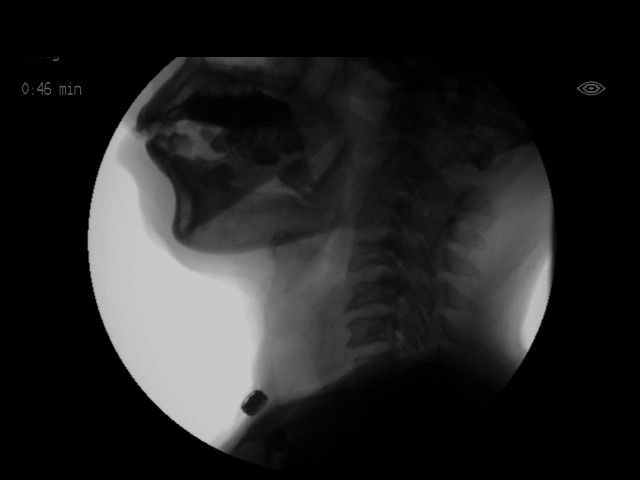
[im 7/14]
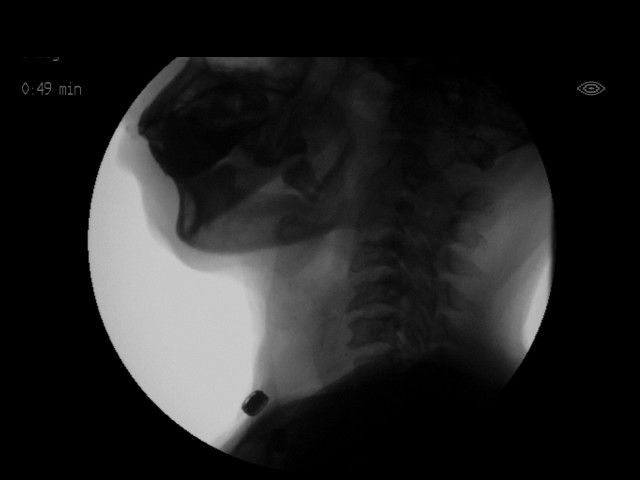
[im 8/14]
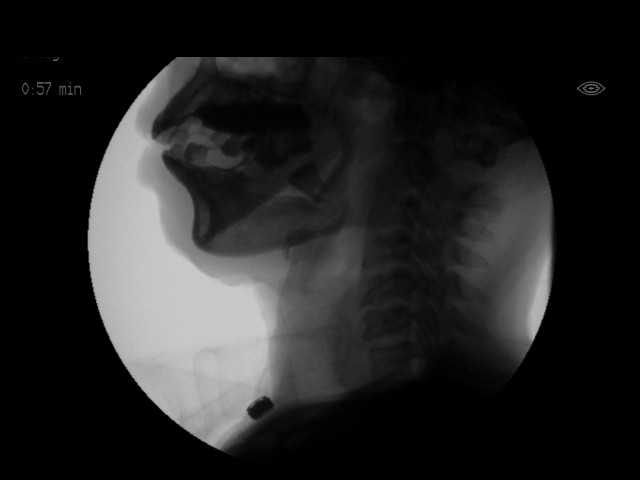
[im 9/14]
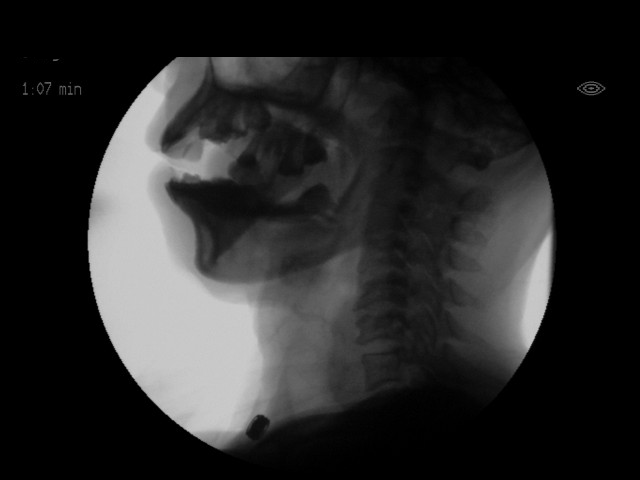
[im 10/14]
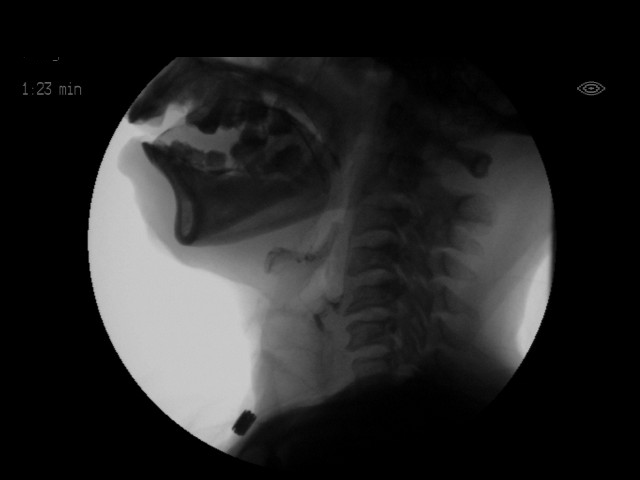
[im 10/14]
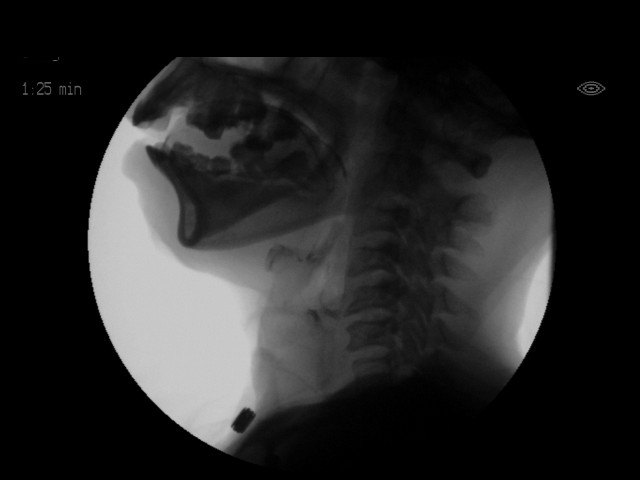
[im 11/14]
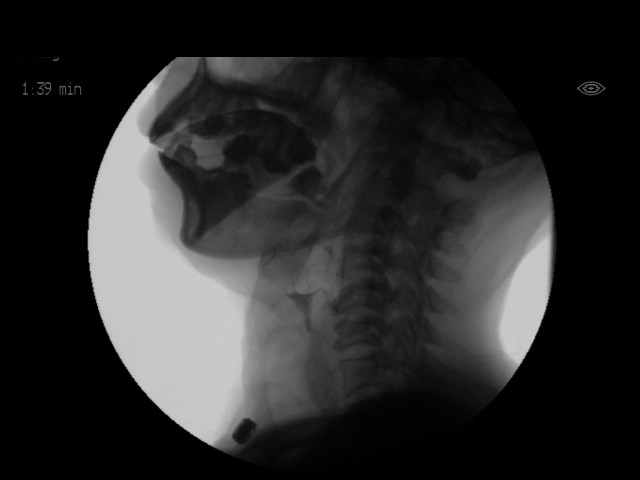
[im 12/14]
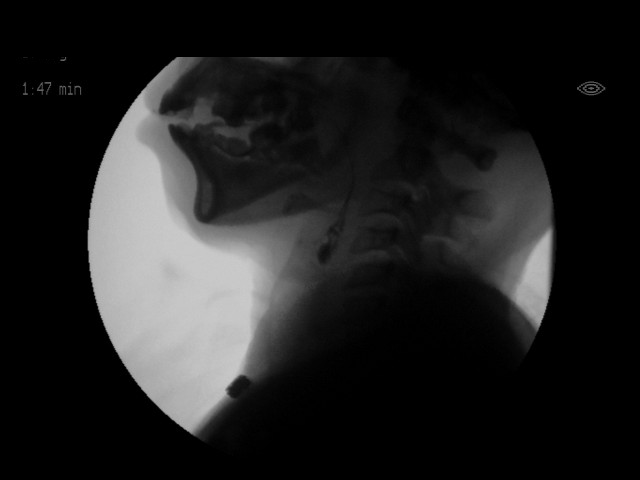
[im 13/14]
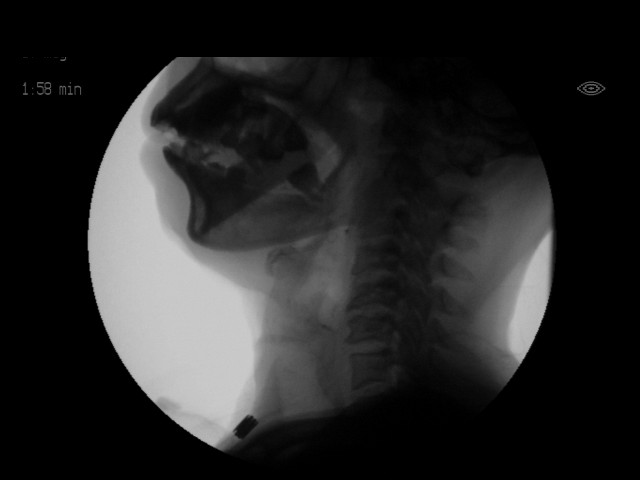
[im 14/14]
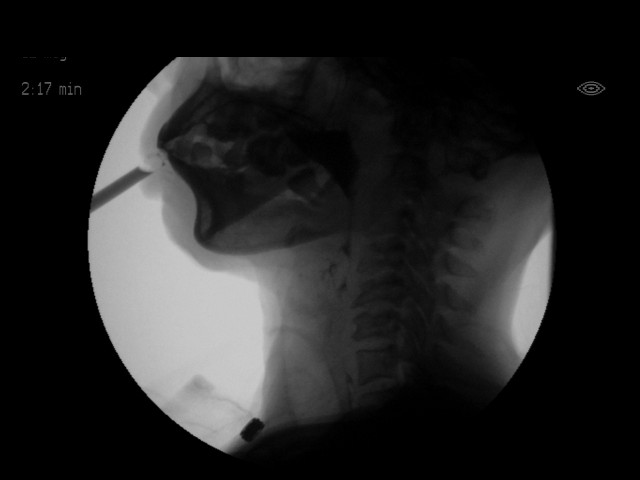
[im 14/14]
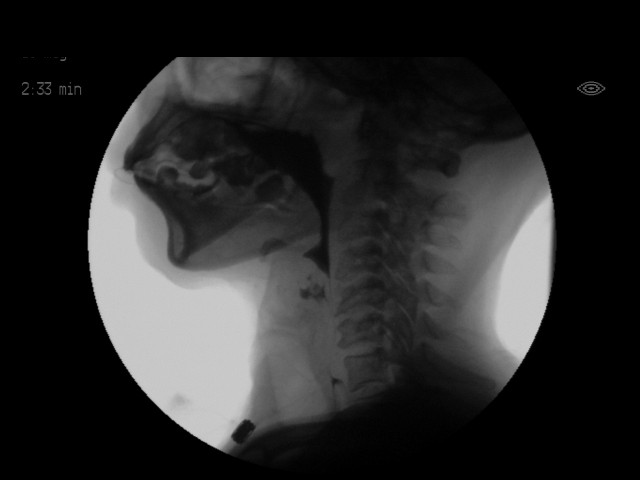

[18 of 24 positions shown; findings below may reference images not displayed]

FLUOROSCOPY FOR SWALLOWING FUNCTION STUDY:
Fluoroscopy was provided for swallowing function study, which was administered by a speech pathologist.  Final results and recommendations from this study are contained within the speech pathology report.

## 2017-12-25 MED FILL — BIKTARVY 50-200-25 MG TABS: 50-200-25 | 30 days supply | Qty: 30 | Fill #4

## 2018-01-02 ENCOUNTER — Telehealth: Payer: Self-pay

## 2018-01-02 NOTE — Telephone Encounter (Signed)
Patient's name came up on overdue pap list. Attempted to call patient with number listed in chart to schedule a pap appointment. Patient is currently not taking any calls at this time Samantha Kline, CMA

## 2018-01-23 MED FILL — BIKTARVY 50-200-25 MG TABS: 50-200-25 | 30 days supply | Qty: 30 | Fill #5

## 2018-02-11 ENCOUNTER — Ambulatory Visit (INDEPENDENT_AMBULATORY_CARE_PROVIDER_SITE_OTHER): Payer: Managed Care, Other (non HMO)

## 2018-02-11 DIAGNOSIS — Z23 Encounter for immunization: Secondary | ICD-10-CM

## 2018-02-24 MED FILL — BIKTARVY 50-200-25 MG TABS: 50-200-25 | 30 days supply | Qty: 30 | Fill #6

## 2018-03-26 MED FILL — BIKTARVY 50-200-25 MG TABS: 50-200-25 | 30 days supply | Qty: 30 | Fill #7

## 2018-04-25 MED FILL — BIKTARVY 50-200-25 MG TABS: 50-200-25 | 30 days supply | Qty: 30 | Fill #8

## 2018-05-23 MED FILL — BIKTARVY 50-200-25 MG TABS: 50-200-25 | 30 days supply | Qty: 30 | Fill #9

## 2018-06-04 ENCOUNTER — Other Ambulatory Visit: Payer: Managed Care, Other (non HMO)

## 2018-06-04 DIAGNOSIS — Z79899 Other long term (current) drug therapy: Secondary | ICD-10-CM

## 2018-06-04 DIAGNOSIS — B20 Human immunodeficiency virus [HIV] disease: Secondary | ICD-10-CM

## 2018-06-04 DIAGNOSIS — Z113 Encounter for screening for infections with a predominantly sexual mode of transmission: Secondary | ICD-10-CM

## 2018-06-05 ENCOUNTER — Other Ambulatory Visit (HOSPITAL_COMMUNITY)
Admission: RE | Admit: 2018-06-05 | Discharge: 2018-06-05 | Disposition: A | Discharge status: Home / Self Care | Payer: Managed Care, Other (non HMO) | Source: Ambulatory Visit | Attending: Infectious Disease | Admitting: Infectious Disease

## 2018-06-05 DIAGNOSIS — Z113 Encounter for screening for infections with a predominantly sexual mode of transmission: Secondary | ICD-10-CM | POA: Diagnosis present

## 2018-06-05 DIAGNOSIS — Z79899 Other long term (current) drug therapy: Secondary | ICD-10-CM | POA: Insufficient documentation

## 2018-06-06 LAB — CBC WITH DIFFERENTIAL/PLATELET
ABSOLUTE MONOCYTES: 445 {cells}/uL (ref 200–950)
BASOS PCT: 1 %
Basophils Absolute: 50 cells/uL (ref 0–200)
EOS ABS: 110 {cells}/uL (ref 15–500)
Eosinophils Relative: 2.2 %
HEMATOCRIT: 34.5 % — AB (ref 35.0–45.0)
Hemoglobin: 11.7 g/dL (ref 11.7–15.5)
LYMPHS ABS: 2070 {cells}/uL (ref 850–3900)
MCH: 31.7 pg (ref 27.0–33.0)
MCHC: 33.9 g/dL (ref 32.0–36.0)
MCV: 93.5 fL (ref 80.0–100.0)
MPV: 11 fL (ref 7.5–12.5)
Monocytes Relative: 8.9 %
NEUTROS PCT: 46.5 %
Neutro Abs: 2325 cells/uL (ref 1500–7800)
PLATELETS: 279 10*3/uL (ref 140–400)
RBC: 3.69 10*6/uL — ABNORMAL LOW (ref 3.80–5.10)
RDW: 13.1 % (ref 11.0–15.0)
TOTAL LYMPHOCYTE: 41.4 %
WBC: 5 10*3/uL (ref 3.8–10.8)

## 2018-06-06 LAB — RPR: RPR Ser Ql: NONREACTIVE

## 2018-06-06 LAB — COMPLETE METABOLIC PANEL WITH GFR
AG RATIO: 0.9 (calc) — AB (ref 1.0–2.5)
ALBUMIN MSPROF: 3.6 g/dL (ref 3.6–5.1)
ALT: 8 U/L (ref 6–29)
AST: 16 U/L (ref 10–35)
Alkaline phosphatase (APISO): 91 U/L (ref 37–153)
BUN: 10 mg/dL (ref 7–25)
CO2: 27 mmol/L (ref 20–32)
CREATININE: 0.98 mg/dL (ref 0.50–1.05)
Calcium: 9.4 mg/dL (ref 8.6–10.4)
Chloride: 106 mmol/L (ref 98–110)
GFR, Est African American: 73 mL/min/{1.73_m2} (ref >=60)
GFR, Est Non African American: 63 mL/min/{1.73_m2} (ref >=60)
GLOBULIN: 3.9 g/dL — AB (ref 1.9–3.7)
Glucose, Bld: 85 mg/dL (ref 65–99)
Potassium: 4.7 mmol/L (ref 3.5–5.3)
SODIUM: 141 mmol/L (ref 135–146)
Total Bilirubin: 0.3 mg/dL (ref 0.2–1.2)
Total Protein: 7.5 g/dL (ref 6.1–8.1)

## 2018-06-06 LAB — LIPID PANEL
CHOL/HDL RATIO: 2.7 (calc) (ref <5.0)
CHOLESTEROL: 225 mg/dL — AB (ref <200)
HDL: 82 mg/dL (ref >=50)
LDL CHOLESTEROL (CALC): 125 mg/dL — AB
Non-HDL Cholesterol (Calc): 143 mg/dL (calc) — ABNORMAL HIGH (ref <130)
TRIGLYCERIDES: 83 mg/dL (ref <150)

## 2018-06-06 LAB — URINE CYTOLOGY ANCILLARY ONLY
Chlamydia: NEGATIVE
NEISSERIA GONORRHEA: NEGATIVE

## 2018-06-06 LAB — HIV-1 RNA QUANT-NO REFLEX-BLD
HIV 1 RNA QUANT: DETECTED {copies}/mL — AB
HIV-1 RNA Quant, Log: <1.3 Log copies/mL — AB

## 2018-06-06 LAB — T-HELPER CELL (CD4) - (RCID CLINIC ONLY)
CD4 T CELL ABS: 370 /uL — AB (ref 400–2700)
CD4 T CELL HELPER: 16 % — AB (ref 33–55)

## 2018-06-16 ENCOUNTER — Other Ambulatory Visit: Payer: Self-pay | Admitting: Infectious Disease

## 2018-06-16 DIAGNOSIS — B20 Human immunodeficiency virus [HIV] disease: Secondary | ICD-10-CM

## 2018-06-17 ENCOUNTER — Ambulatory Visit: Payer: Managed Care, Other (non HMO) | Admitting: Infectious Disease

## 2018-06-17 VITALS — BP 133/81 | HR 69

## 2018-06-17 DIAGNOSIS — B20 Human immunodeficiency virus [HIV] disease: Secondary | ICD-10-CM | POA: Diagnosis not present

## 2018-06-17 DIAGNOSIS — Z23 Encounter for immunization: Secondary | ICD-10-CM | POA: Diagnosis not present

## 2018-06-17 DIAGNOSIS — Z113 Encounter for screening for infections with a predominantly sexual mode of transmission: Secondary | ICD-10-CM

## 2018-06-17 DIAGNOSIS — Z79899 Other long term (current) drug therapy: Secondary | ICD-10-CM

## 2018-06-17 NOTE — Progress Notes (Signed)
Subjective:   Chief complaint: followup for her HIV   Patient ID: Samantha Kline, female    DOB: 06-24-1960, 58 y.o.   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  She had protracted stay and eventually went to inpatient rehab. She was seen several times by Palliative care.   She completed 21 days of GCV for her CMV esophagitis supported by CSF for part of her therapy. She did continue to have neutropenia, leukopenia after coming off the GCV and her G-CSF.    Her virus responded very nicely to Tivicay and Descovy and now  BIKTARVY.  She has been wonderfully adherent and maintained perfect virological suppression. CD4 has now risen above 400.  She is here again for followup with her husband who has tested negative for HIV repeatedly.   No complaints today at all and is doing a superb job of being adherent to her antiretroviral therapy.     Past Medical History:  Diagnosis Date  . Anemia   . HIV (human immunodeficiency virus infection) (HCC)    11/06/15 Family states it was just diagnosed this week  . Pneumonia 10/24/2015  . Rash and nonspecific skin eruption 02/21/2016  . Vitamin D deficiency     Past Surgical History:  Procedure Laterality Date  . CLOSED REDUCTION FINGER WITH PERCUTANEOUS PINNING Left 12/18/2015   Procedure: Closed reduction of Left long finger PIP dislocation;  Surgeon: Dairl Ponder, MD;  Location: [facility] OR;  Service: Orthopedics;  Laterality: Left;  . ESOPHAGOGASTRODUODENOSCOPY N/A 11/23/2015   Procedure: ESOPHAGOGASTRODUODENOSCOPY (EGD);  Surgeon: Vida Rigger, MD;  Location: [facility] ENDOSCOPY;  Service: Endoscopy;  Laterality: N/A;  . TUBAL LIGATION  1980s    Family History  Problem Relation Age of Onset  . Diabetes Father   . Heart attack Brother   . Hypertension Sister       Social History   Socioeconomic History  .  Marital status: Married    Spouse name: Not on file  . Number of children: Not on file  . Years of education: Not on file  . Highest education level: Not on file  Occupational History  . Not on file  Social Needs  . Financial resource strain: Not on file  . Food insecurity:    Worry: Not on file    Inability: Not on file  . Transportation needs:    Medical: Not on file    Non-medical: Not on file  Tobacco Use  . Smoking status: Never Smoker  . Smokeless tobacco: Never Used  Substance and Sexual Activity  . Alcohol use: No    Comment: 10/24/2015 "nothing since the 1980s"  . Drug use: No  . Sexual activity: Not on file  Lifestyle  . Physical activity:    Days per week: Not on file    Minutes per session: Not on file  . Stress: Not on file  Relationships  . Social connections:    Talks on phone: Not on file    Gets together: Not on file    Attends religious service: Not on file    Active member of club or organization: Not on file    Attends meetings of clubs or organizations: Not on file    Relationship status: Not on file  Other Topics Concern  . Not on file  Social History Narrative  . Not on file  Allergies  Allergen Reactions  . Septra [Sulfamethoxazole-Trimethoprim] Anaphylaxis    Possible cause of hyponatremia Possible cause of hyponatremia     Current Outpatient Medications:  .  bictegravir-emtricitabine-tenofovir AF (BIKTARVY) 50-200-25 MG TABS tablet, Take 1 tablet by mouth daily., Disp: 30 tablet, Rfl: 11 .  vitamin B-12 (CYANOCOBALAMIN) 100 MCG tablet, Take 1 tablet (100 mcg total) by mouth daily., Disp: 30 tablet, Rfl: 0   Review of Systems  Constitutional: Negative for chills, diaphoresis and fever.  HENT: Negative for congestion, hearing loss, sore throat and tinnitus.   Respiratory: Negative for cough, chest tightness, shortness of breath and wheezing.   Cardiovascular: Negative for chest pain, palpitations and leg swelling.  Gastrointestinal:  Negative for abdominal pain, blood in stool, constipation, diarrhea, nausea and vomiting.  Genitourinary: Negative for dysuria, flank pain and hematuria.  Musculoskeletal: Negative for back pain and myalgias.  Skin: Negative for rash.  Neurological: Negative for dizziness, weakness and headaches.  Hematological: Does not bruise/bleed easily.  Psychiatric/Behavioral: Negative.  Negative for agitation, behavioral problems, confusion, decreased concentration, dysphoric mood, hallucinations and suicidal ideas. The patient is not nervous/anxious.        Objective:   Physical Exam  Constitutional: She is oriented to person, place, and time. She appears well-developed and well-nourished. No distress.  HENT:  Head: Normocephalic and atraumatic.  Mouth/Throat: Uvula is midline and oropharynx is clear and moist. No oropharyngeal exudate.  Eyes: Conjunctivae and EOM are normal. No scleral icterus.  Neck: Normal range of motion. Neck supple.  Cardiovascular: Normal rate and regular rhythm.  Pulmonary/Chest: Breath sounds normal. She has no wheezes.  Abdominal: Soft. She exhibits no distension.  Musculoskeletal:        General: No tenderness or edema.  Lymphadenopathy:    She has no cervical adenopathy.  Neurological: She is alert and oriented to person, place, and time. She exhibits normal muscle tone. Coordination normal.  Skin: Skin is warm and dry. No rash noted. She is not diaphoretic. No erythema. No pallor.  Psychiatric: She has a normal mood and affect. Her speech is normal and behavior is normal. Judgment and thought content normal. Cognition and memory are normal.  Nursing note and vitals reviewed.    Assessment & Plan:    HIV/AIDS: continue BIKTARVY. 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 She an follouwup in one year with me again.

## 2018-06-25 MED FILL — BIKTARVY 50-200-25 MG TABS: 50-200-25 | 30 days supply | Qty: 30 | Fill #0

## 2018-07-17 NOTE — Addendum Note (Signed)
Addended by: Andree Coss on: 07/17/2018 03:32 PM   Modules accepted: Orders

## 2018-07-21 MED FILL — BIKTARVY 50-200-25 MG TABS: 50-200-25 | 30 days supply | Qty: 30 | Fill #1

## 2018-08-26 MED FILL — BIKTARVY 50-200-25 MG TABS: 50-200-25 | 30 days supply | Qty: 30 | Fill #2

## 2018-09-25 MED FILL — BIKTARVY 50-200-25 MG TABS: 50-200-25 | 30 days supply | Qty: 30 | Fill #3

## 2018-10-23 MED FILL — BIKTARVY 50-200-25 MG TABS: 50-200-25 | 30 days supply | Qty: 30 | Fill #4

## 2018-11-26 MED FILL — BIKTARVY 50-200-25 MG TABS: 50-200-25 | 30 days supply | Qty: 30 | Fill #5

## 2018-12-25 MED FILL — BIKTARVY 50-200-25 MG TABS: 50-200-25 | 30 days supply | Qty: 30 | Fill #6

## 2019-01-23 MED FILL — BIKTARVY 50-200-25 MG TABS: 50-200-25 | 30 days supply | Qty: 30 | Fill #7

## 2019-02-19 MED FILL — BIKTARVY 50-200-25 MG TABS: 50-200-25 | 30 days supply | Qty: 30 | Fill #8

## 2019-03-26 MED FILL — BIKTARVY 50-200-25 MG TABS: 50-200-25 | 30 days supply | Qty: 30 | Fill #9

## 2019-04-27 MED FILL — BIKTARVY 50-200-25 MG TABS: 50-200-25 | 30 days supply | Qty: 30 | Fill #10

## 2019-05-28 MED FILL — BIKTARVY 50-200-25 MG TABS: 50-200-25 | 30 days supply | Qty: 30 | Fill #11

## 2019-06-03 ENCOUNTER — Other Ambulatory Visit: Payer: Managed Care, Other (non HMO)

## 2019-06-03 DIAGNOSIS — B20 Human immunodeficiency virus [HIV] disease: Secondary | ICD-10-CM

## 2019-06-03 DIAGNOSIS — Z79899 Other long term (current) drug therapy: Secondary | ICD-10-CM

## 2019-06-03 DIAGNOSIS — Z113 Encounter for screening for infections with a predominantly sexual mode of transmission: Secondary | ICD-10-CM

## 2019-06-03 NOTE — Addendum Note (Signed)
Addended by: Mariea Clonts D on: 06/03/2019 09:17 AM   Modules accepted: Orders

## 2019-06-04 LAB — T-HELPER CELL (CD4) - (RCID CLINIC ONLY)
CD4 % Helper T Cell: 21 % — ABNORMAL LOW (ref 33–65)
CD4 T Cell Abs: 423 /uL (ref 400–1790)

## 2019-06-07 LAB — CBC WITH DIFFERENTIAL/PLATELET
Absolute Monocytes: 418 cells/uL (ref 200–950)
Basophils Absolute: 29 cells/uL (ref 0–200)
Basophils Relative: 0.6 %
Eosinophils Absolute: 72 cells/uL (ref 15–500)
Eosinophils Relative: 1.5 %
HCT: 36.6 % (ref 35.0–45.0)
Hemoglobin: 12.5 g/dL (ref 11.7–15.5)
Lymphs Abs: 2035 cells/uL (ref 850–3900)
MCH: 32.1 pg (ref 27.0–33.0)
MCHC: 34.2 g/dL (ref 32.0–36.0)
MCV: 94.1 fL (ref 80.0–100.0)
MPV: 10.2 fL (ref 7.5–12.5)
Monocytes Relative: 8.7 %
Neutro Abs: 2246 cells/uL (ref 1500–7800)
Neutrophils Relative %: 46.8 %
Platelets: 261 10*3/uL (ref 140–400)
RBC: 3.89 10*6/uL (ref 3.80–5.10)
RDW: 12.8 % (ref 11.0–15.0)
Total Lymphocyte: 42.4 %
WBC: 4.8 10*3/uL (ref 3.8–10.8)

## 2019-06-07 LAB — COMPLETE METABOLIC PANEL WITH GFR
AG Ratio: 1.3 (calc) (ref 1.0–2.5)
ALT: 9 U/L (ref 6–29)
AST: 23 U/L (ref 10–35)
Albumin: 4.3 g/dL (ref 3.6–5.1)
Alkaline phosphatase (APISO): 71 U/L (ref 37–153)
BUN/Creatinine Ratio: 8 (calc) (ref 6–22)
BUN: 9 mg/dL (ref 7–25)
CO2: 30 mmol/L (ref 20–32)
Calcium: 9.6 mg/dL (ref 8.6–10.4)
Chloride: 104 mmol/L (ref 98–110)
Creat: 1.1 mg/dL — ABNORMAL HIGH (ref 0.50–0.99)
GFR, Est African American: 63 mL/min/{1.73_m2} (ref >=60)
GFR, Est Non African American: 55 mL/min/{1.73_m2} — ABNORMAL LOW (ref >=60)
Globulin: 3.3 g/dL (calc) (ref 1.9–3.7)
Glucose, Bld: 83 mg/dL (ref 65–99)
Potassium: 4.6 mmol/L (ref 3.5–5.3)
Sodium: 140 mmol/L (ref 135–146)
Total Bilirubin: 0.6 mg/dL (ref 0.2–1.2)
Total Protein: 7.6 g/dL (ref 6.1–8.1)

## 2019-06-07 LAB — LIPID PANEL
Cholesterol: 216 mg/dL — ABNORMAL HIGH (ref <200)
HDL: 91 mg/dL (ref >=50)
LDL Cholesterol (Calc): 107 mg/dL (calc) — ABNORMAL HIGH
Non-HDL Cholesterol (Calc): 125 mg/dL (calc) (ref <130)
Total CHOL/HDL Ratio: 2.4 (calc) (ref <5.0)
Triglycerides: 89 mg/dL (ref <150)

## 2019-06-07 LAB — RPR: RPR Ser Ql: NONREACTIVE

## 2019-06-07 LAB — HIV-1 RNA QUANT-NO REFLEX-BLD
HIV 1 RNA Quant: <20 copies/mL
HIV-1 RNA Quant, Log: <1.3 Log copies/mL

## 2019-06-17 ENCOUNTER — Other Ambulatory Visit: Payer: Self-pay

## 2019-06-17 ENCOUNTER — Encounter: Payer: Self-pay | Admitting: Infectious Disease

## 2019-06-17 ENCOUNTER — Ambulatory Visit: Payer: Managed Care, Other (non HMO) | Admitting: Infectious Disease

## 2019-06-17 VITALS — BP 124/71 | HR 103 | Temp 98.4°F | Wt 135.0 lb

## 2019-06-17 DIAGNOSIS — Z79899 Other long term (current) drug therapy: Secondary | ICD-10-CM

## 2019-06-17 DIAGNOSIS — B20 Human immunodeficiency virus [HIV] disease: Secondary | ICD-10-CM

## 2019-06-17 DIAGNOSIS — Z113 Encounter for screening for infections with a predominantly sexual mode of transmission: Secondary | ICD-10-CM

## 2019-06-17 MED ORDER — BIKTARVY 50-200-25 MG PO TABS: 1.0000 | ORAL_TABLET | Freq: Every day | ORAL | 11 refills | Status: DC

## 2019-06-17 NOTE — Progress Notes (Signed)
Subjective:   Chief complaint: followup for her HIV   Patient ID: Samantha Kline, female    DOB: Jan 04, 1961, 59 y.o.   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  She had protracted stay and eventually went to inpatient rehab. She was seen several times by Palliative care.   She completed 21 days of GCV for her CMV esophagitis supported by G-CSF for part of her therapy. She did continue to have neutropenia, leukopenia after coming off the GCV and her G-CSF.   Her virus responded very nicely to Tivicay and Descovy and now  BIKTARVY.  She has been wonderfully adherent and maintained perfect virological suppression. CD4 has now risen above 400.  She is here again for followup with her husband who has tested negative for HIV repeatedly.   We have since been seeing her once yearly.  She gets her [facility].  Here again with her husband and her great grandchild who is now 34 weeks old.  She has had not had flu shot which I offered her.  I also strongly recommend that she get the COVID-19 vaccine though she is having some hesitancy about this.      Past Medical History:  Diagnosis Date  . Anemia   . HIV (human immunodeficiency virus infection) (HCC)    11/06/15 Family states it was just diagnosed this week  . Pneumonia 10/24/2015  . Rash and nonspecific skin eruption 02/21/2016  . Vitamin D deficiency     Past Surgical History:  Procedure Laterality Date  . CLOSED REDUCTION FINGER WITH PERCUTANEOUS PINNING Left 12/18/2015   Procedure: Closed reduction of Left long finger PIP dislocation;  Surgeon: Dairl Ponder, MD;  Location: [facility] OR;  Service: Orthopedics;  Laterality: Left;  . ESOPHAGOGASTRODUODENOSCOPY N/A 11/23/2015   Procedure: ESOPHAGOGASTRODUODENOSCOPY (EGD);  Surgeon: Vida Rigger, MD;  Location: [facility] ENDOSCOPY;  Service:  Endoscopy;  Laterality: N/A;  . TUBAL LIGATION  1980s    Family History  Problem Relation Age of Onset  . Diabetes Father   . Heart attack Brother   . Hypertension Sister       Social History   Socioeconomic History  . Marital status: Married    Spouse name: Not on file  . Number of children: Not on file  . Years of education: Not on file  . Highest education level: Not on file  Occupational History  . Not on file  Tobacco Use  . Smoking status: Never Smoker  . Smokeless tobacco: Never Used  Substance and Sexual Activity  . Alcohol use: No    Comment: 10/24/2015 "nothing since the 1980s"  . Drug use: No  . Sexual activity: Not on file  Other Topics Concern  . Not on file  Social History Narrative  . Not on file   Social Determinants of Health   Financial Resource Strain:   . Difficulty of Paying Living Expenses: Not on file  Food Insecurity:   . Worried About Programme researcher, broadcasting/film/video in the Last Year: Not on file  . Ran Out of Food in the Last Year: Not on file  Transportation Needs:   . Lack of Transportation (Medical): Not on file  . Lack of Transportation (Non-Medical): Not on file  Physical Activity:   . Days of Exercise per Week: Not on file  . Minutes of Exercise per Session: Not  on file  Stress:   . Feeling of Stress : Not on file  Social Connections:   . Frequency of Communication with Friends and Family: Not on file  . Frequency of Social Gatherings with Friends and Family: Not on file  . Attends Religious Services: Not on file  . Active Member of Clubs or Organizations: Not on file  . Attends Archivist Meetings: Not on file  . Marital Status: Not on file    Allergies  Allergen Reactions  . Septra [Sulfamethoxazole-Trimethoprim] Anaphylaxis    Possible cause of hyponatremia Possible cause of hyponatremia     Current Outpatient Medications:  .  BIKTARVY 50-200-25 MG TABS tablet, TAKE 1 TABLET BY MOUTH DAILY., Disp: 30 tablet, Rfl: 11 .   vitamin B-12 (CYANOCOBALAMIN) 100 MCG tablet, Take 1 tablet (100 mcg total) by mouth daily., Disp: 30 tablet, Rfl: 0   Review of Systems  Constitutional: Negative for chills, diaphoresis and fever.  HENT: Negative for congestion, hearing loss, sore throat and tinnitus.   Eyes: Negative for photophobia.  Respiratory: Negative for cough, chest tightness, shortness of breath and wheezing.   Cardiovascular: Negative for chest pain, palpitations and leg swelling.  Gastrointestinal: Negative for abdominal pain, blood in stool, constipation, diarrhea, nausea and vomiting.  Genitourinary: Negative for dysuria, flank pain and hematuria.  Musculoskeletal: Negative for back pain and myalgias.  Skin: Negative for rash.  Neurological: Negative for dizziness, weakness and headaches.  Hematological: Does not bruise/bleed easily.  Psychiatric/Behavioral: Negative.  Negative for agitation, behavioral problems, confusion, decreased concentration, dysphoric mood, hallucinations and suicidal ideas. The patient is not nervous/anxious.        Objective:   Physical Exam  Constitutional: She is oriented to person, place, and time. She appears well-developed and well-nourished. No distress.  HENT:  Head: Normocephalic and atraumatic.  Mouth/Throat: Uvula is midline and oropharynx is clear and moist. No oropharyngeal exudate.  Eyes: Conjunctivae and EOM are normal. No scleral icterus.  Cardiovascular: Normal rate and regular rhythm.  Pulmonary/Chest: Breath sounds normal. She has no wheezes.  Abdominal: Soft. She exhibits no distension.  Musculoskeletal:        General: No tenderness, deformity or edema.     Cervical back: Normal range of motion and neck supple.  Lymphadenopathy:    She has no cervical adenopathy.  Neurological: She is alert and oriented to person, place, and time. She exhibits normal muscle tone. Coordination normal.  Skin: Skin is warm and dry. No rash noted. She is not diaphoretic. No  erythema. No pallor.  Psychiatric: She has a normal mood and affect. Her speech is normal and behavior is normal. Judgment and thought content normal. Cognition and memory are normal.  Nursing note and vitals reviewed.    Assessment & Plan:    HIV/AIDS: continue BIKTARVY.  1 years worth of prescription sent to was a long again and she can come back in a years time to see me  Bovid prevention I really would prefer if she would get COVID-19 vaccination for her own health and that of her families and other contacts.

## 2019-06-18 ENCOUNTER — Other Ambulatory Visit: Payer: Self-pay | Admitting: Infectious Disease

## 2019-06-18 DIAGNOSIS — B20 Human immunodeficiency virus [HIV] disease: Secondary | ICD-10-CM

## 2019-06-29 MED FILL — BIKTARVY 50-200-25 MG TABS: 50-200-25 | 30 days supply | Qty: 30 | Fill #0

## 2019-07-29 MED FILL — BIKTARVY 50-200-25 MG TABS: 50-200-25 | 30 days supply | Qty: 30 | Fill #1

## 2019-08-26 MED FILL — BIKTARVY 50-200-25 MG TABS: 50-200-25 | 30 days supply | Qty: 30 | Fill #2

## 2019-09-23 MED FILL — BIKTARVY 50-200-25 MG TABS: 50-200-25 | 30 days supply | Qty: 30 | Fill #3

## 2019-10-22 MED FILL — BIKTARVY 50-200-25 MG TABS: 50-200-25 | 30 days supply | Qty: 30 | Fill #4

## 2019-11-23 MED FILL — BIKTARVY 50-200-25 MG TABS: 50-200-25 | 30 days supply | Qty: 30 | Fill #5

## 2019-12-24 ENCOUNTER — Other Ambulatory Visit: Payer: Self-pay | Admitting: Pharmacist

## 2019-12-24 DIAGNOSIS — B20 Human immunodeficiency virus [HIV] disease: Secondary | ICD-10-CM

## 2019-12-24 MED ORDER — BIKTARVY 50-200-25 MG PO TABS: 1.0000 | ORAL_TABLET | Freq: Every day | ORAL | 11 refills | Status: DC

## 2019-12-24 NOTE — Progress Notes (Signed)
Patient's insurance requires Biktarvy to be filled at [facility]. Resending Rx now. Kathie Rhodes will call patient and coordinate.

## 2020-07-04 ENCOUNTER — Other Ambulatory Visit: Payer: Managed Care, Other (non HMO)

## 2020-07-04 ENCOUNTER — Other Ambulatory Visit: Payer: Self-pay

## 2020-07-04 DIAGNOSIS — B20 Human immunodeficiency virus [HIV] disease: Secondary | ICD-10-CM

## 2020-07-04 DIAGNOSIS — Z113 Encounter for screening for infections with a predominantly sexual mode of transmission: Secondary | ICD-10-CM

## 2020-07-04 DIAGNOSIS — Z79899 Other long term (current) drug therapy: Secondary | ICD-10-CM

## 2020-07-05 LAB — T-HELPER CELL (CD4) - (RCID CLINIC ONLY)
CD4 % Helper T Cell: 13 % — ABNORMAL LOW (ref 33–65)
CD4 T Cell Abs: 245 /uL — ABNORMAL LOW (ref 400–1790)

## 2020-07-07 LAB — COMPLETE METABOLIC PANEL WITH GFR
AG Ratio: 1 (calc) (ref 1.0–2.5)
ALT: 10 U/L (ref 6–29)
AST: 22 U/L (ref 10–35)
Albumin: 3.9 g/dL (ref 3.6–5.1)
Alkaline phosphatase (APISO): 68 U/L (ref 37–153)
BUN/Creatinine Ratio: 6 (calc) (ref 6–22)
BUN: 5 mg/dL — ABNORMAL LOW (ref 7–25)
CO2: 24 mmol/L (ref 20–32)
Calcium: 9.1 mg/dL (ref 8.6–10.4)
Chloride: 104 mmol/L (ref 98–110)
Creat: 0.85 mg/dL (ref 0.50–0.99)
GFR, Est African American: 86 mL/min/{1.73_m2} (ref >=60)
GFR, Est Non African American: 74 mL/min/{1.73_m2} (ref >=60)
Globulin: 4 g/dL (calc) — ABNORMAL HIGH (ref 1.9–3.7)
Glucose, Bld: 73 mg/dL (ref 65–99)
Potassium: 4 mmol/L (ref 3.5–5.3)
Sodium: 138 mmol/L (ref 135–146)
Total Bilirubin: 0.5 mg/dL (ref 0.2–1.2)
Total Protein: 7.9 g/dL (ref 6.1–8.1)

## 2020-07-07 LAB — CBC WITH DIFFERENTIAL/PLATELET
Absolute Monocytes: 530 cells/uL (ref 200–950)
Basophils Absolute: 20 cells/uL (ref 0–200)
Basophils Relative: 0.4 %
Eosinophils Absolute: 31 cells/uL (ref 15–500)
Eosinophils Relative: 0.6 %
HCT: 32.7 % — ABNORMAL LOW (ref 35.0–45.0)
Hemoglobin: 11.3 g/dL — ABNORMAL LOW (ref 11.7–15.5)
Lymphs Abs: 1923 cells/uL (ref 850–3900)
MCH: 30.9 pg (ref 27.0–33.0)
MCHC: 34.6 g/dL (ref 32.0–36.0)
MCV: 89.3 fL (ref 80.0–100.0)
MPV: 10.4 fL (ref 7.5–12.5)
Monocytes Relative: 10.4 %
Neutro Abs: 2596 cells/uL (ref 1500–7800)
Neutrophils Relative %: 50.9 %
Platelets: 257 10*3/uL (ref 140–400)
RBC: 3.66 10*6/uL — ABNORMAL LOW (ref 3.80–5.10)
RDW: 13.1 % (ref 11.0–15.0)
Total Lymphocyte: 37.7 %
WBC: 5.1 10*3/uL (ref 3.8–10.8)

## 2020-07-07 LAB — LIPID PANEL
Cholesterol: 180 mg/dL (ref <200)
HDL: 66 mg/dL (ref >=50)
LDL Cholesterol (Calc): 95 mg/dL (calc)
Non-HDL Cholesterol (Calc): 114 mg/dL (calc) (ref <130)
Total CHOL/HDL Ratio: 2.7 (calc) (ref <5.0)
Triglycerides: 96 mg/dL (ref <150)

## 2020-07-07 LAB — HIV-1 RNA QUANT-NO REFLEX-BLD
HIV 1 RNA Quant: 81400 Copies/mL — ABNORMAL HIGH
HIV-1 RNA Quant, Log: 4.91 Log cps/mL — ABNORMAL HIGH

## 2020-07-07 LAB — RPR: RPR Ser Ql: NONREACTIVE

## 2020-07-15 ENCOUNTER — Other Ambulatory Visit (HOSPITAL_COMMUNITY): Payer: Self-pay

## 2020-07-18 ENCOUNTER — Other Ambulatory Visit: Payer: Self-pay

## 2020-07-18 ENCOUNTER — Ambulatory Visit: Payer: Managed Care, Other (non HMO) | Admitting: Infectious Disease

## 2020-07-18 ENCOUNTER — Encounter: Payer: Self-pay | Admitting: Infectious Disease

## 2020-07-18 VITALS — BP 148/82 | HR 86 | Temp 98.5°F | Ht 63.0 in | Wt 126.0 lb

## 2020-07-18 DIAGNOSIS — Z91199 Patient's noncompliance with other medical treatment and regimen due to unspecified reason: Secondary | ICD-10-CM

## 2020-07-18 DIAGNOSIS — B3781 Candidal esophagitis: Secondary | ICD-10-CM

## 2020-07-18 DIAGNOSIS — B59 Pneumocystosis: Secondary | ICD-10-CM

## 2020-07-18 DIAGNOSIS — Z9119 Patient's noncompliance with other medical treatment and regimen: Secondary | ICD-10-CM | POA: Diagnosis not present

## 2020-07-18 DIAGNOSIS — B259 Cytomegaloviral disease, unspecified: Secondary | ICD-10-CM | POA: Diagnosis not present

## 2020-07-18 DIAGNOSIS — B20 Human immunodeficiency virus [HIV] disease: Secondary | ICD-10-CM | POA: Diagnosis not present

## 2020-07-18 DIAGNOSIS — A310 Pulmonary mycobacterial infection: Secondary | ICD-10-CM

## 2020-07-18 DIAGNOSIS — G9340 Encephalopathy, unspecified: Secondary | ICD-10-CM

## 2020-07-18 MED ORDER — SYMTUZA 800-150-200-10 MG PO TABS: 1.0000 | ORAL_TABLET | Freq: Every day | ORAL | 11 refills | Status: DC

## 2020-07-18 NOTE — Progress Notes (Signed)
Subjective:   Chief complaint: followup for her HIV disease   Patient ID: Samantha Kline, female    DOB: [birth year], 60 y.o.   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  She had protracted stay and eventually went to inpatient rehab. She was seen several times by Palliative care.   She completed 21 days of GCV for her CMV esophagitis supported by G-CSF for part of her therapy. She did continue to have neutropenia, leukopenia after coming off the GCV and her G-CSF.   Her virus responded very nicely to Tivicay and Descovy and then  BIKTARVY.  She had been perfectly controlled for years but now suddenly with a VL of above 80k  She is here again for followup with her husband who has tested negative for HIV repeatedly.   She has been getting meds shipped from [facility]. She denies missing for consistent periods but sounds to now be only taking meds in a more positive way which makes me concerned that she might have virological failure with resistance.  Her husband who is here for the visit as well says they have had sexual intercourse a Kline times in the last 4 months including most recently last night which was without a condom.  She attributes the drop in adherence to only with stressors with her children and also in a relationship with her husband.   Past Medical History:  Diagnosis Date  . Anemia   . HIV (human immunodeficiency virus infection) (HCC)    11/06/15 Family states it was just diagnosed this week  . Pneumonia 10/24/2015  . Rash and nonspecific skin eruption 02/21/2016  . Vitamin D deficiency     Past Surgical History:  Procedure Laterality Date  . CLOSED REDUCTION FINGER WITH PERCUTANEOUS PINNING Left 12/18/2015   Procedure: Closed reduction of Left long finger PIP dislocation;  Surgeon: Dairl Ponder, MD;  Location: [facility] OR;  Service:  Orthopedics;  Laterality: Left;  . ESOPHAGOGASTRODUODENOSCOPY N/A 11/23/2015   Procedure: ESOPHAGOGASTRODUODENOSCOPY (EGD);  Surgeon: Vida Rigger, MD;  Location: [facility] ENDOSCOPY;  Service: Endoscopy;  Laterality: N/A;  . TUBAL LIGATION  1980s    Family History  Problem Relation Age of Onset  . Diabetes Father   . Heart attack Brother   . Hypertension Sister       Social History   Socioeconomic History  . Marital status: Married    Spouse name: Not on file  . Number of children: Not on file  . Years of education: Not on file  . Highest education level: Not on file  Occupational History  . Not on file  Tobacco Use  . Smoking status: Never Smoker  . Smokeless tobacco: Never Used  Substance and Sexual Activity  . Alcohol use: No    Comment: 10/24/2015 "nothing since the 1980s"  . Drug use: No  . Sexual activity: Not on file  Other Topics Concern  . Not on file  Social History Narrative  . Not on file   Social Determinants of Health   Financial Resource Strain: Not on file  Food Insecurity: Not on file  Transportation Needs: Not on file  Physical Activity: Not on file  Stress: Not on file  Social Connections: Not on file    Allergies  Allergen Reactions  . Septra [Sulfamethoxazole-Trimethoprim] Anaphylaxis    Possible cause of hyponatremia Possible cause of hyponatremia  Current Outpatient Medications:  .  bictegravir-emtricitabine-tenofovir AF (BIKTARVY) 50-200-25 MG TABS tablet, Take 1 tablet by mouth daily., Disp: 30 tablet, Rfl: 11 .  Cholecalciferol 25 MCG (1000 UT) tablet, Take by mouth., Disp: , Rfl:  .  vitamin B-12 (CYANOCOBALAMIN) 100 MCG tablet, Take 1 tablet (100 mcg total) by mouth daily., Disp: 30 tablet, Rfl: 0   Review of Systems  Constitutional: Negative for chills, diaphoresis and fever.  HENT: Negative for congestion, hearing loss, sore throat and tinnitus.   Eyes: Negative for photophobia.  Respiratory: Negative for cough, chest tightness,  shortness of breath and wheezing.   Cardiovascular: Negative for chest pain, palpitations and leg swelling.  Gastrointestinal: Negative for abdominal pain, blood in stool, constipation, diarrhea, nausea and vomiting.  Genitourinary: Negative for dysuria, flank pain and hematuria.  Musculoskeletal: Negative for back pain and myalgias.  Skin: Negative for rash.  Neurological: Negative for dizziness, weakness and headaches.  Hematological: Does not bruise/bleed easily.  Psychiatric/Behavioral: Positive for decreased concentration and dysphoric mood. Negative for agitation, behavioral problems, confusion, hallucinations and suicidal ideas. The patient is not nervous/anxious.        Objective:   Physical Exam Vitals and nursing note reviewed.  Constitutional:      General: She is not in acute distress.    Appearance: She is well-developed. She is not diaphoretic.  HENT:     Head: Normocephalic and atraumatic.     Mouth/Throat:     Pharynx: Uvula midline. No oropharyngeal exudate.  Eyes:     General: No scleral icterus.    Conjunctiva/sclera: Conjunctivae normal.  Cardiovascular:     Rate and Rhythm: Normal rate and regular rhythm.  Pulmonary:     Breath sounds: Normal breath sounds. No wheezing.  Abdominal:     General: There is no distension.     Palpations: Abdomen is soft.  Musculoskeletal:        General: No tenderness or deformity.     Cervical back: Normal range of motion and neck supple.  Lymphadenopathy:     Cervical: No cervical adenopathy.  Skin:    General: Skin is warm and dry.     Coloration: Skin is not pale.     Findings: No erythema or rash.  Neurological:     Mental Status: She is alert and oriented to person, place, and time.     Motor: No abnormal muscle tone.     Coordination: Coordination normal.  Psychiatric:        Speech: Speech normal.        Behavior: Behavior normal.        Thought Content: Thought content normal.        Judgment: Judgment  normal.      Assessment & Plan:    HIV/AIDS: Fortunately her CD4 count did not drop.  I am worried though that she could have urological failure with resistance so I am sending a repeat genotype including for integrase resistance.  I will switch her to [facility] and we will see her in 1 month's time. I provided her with a bottle of Symtuza to take with her out the door and sent to mail order pharmacy.  Her sulfa allergy was not really an allergy but a possible cause of hyponatremia.  I think she will not have any issues with allergies to [facility]  I have cautioned about risk for nausea and diarrhea and that I can prescribe antiemetic medications  Depression and problems in the relationship: Have encouraged them to get  into counseling with our therapist here.   I spent greater than 40 minutes with the patient including greater than 50% of time in face to face counsel of the patient regarding the nature of HIV and how it mutations in the face of suboptimal therapy and how it such mutations can lead to [place] not be able to prescribe certain regimens anymore, emphasizing the need of high-level adherence and in coordination of her care

## 2020-07-26 LAB — HIV-1 GENOTYPING (RTI,PI,IN INHBTR)
Date Viral Load Collected: 3282002
HIV-1 Genotype: DETECTED — AB

## 2020-08-19 ENCOUNTER — Other Ambulatory Visit (HOSPITAL_COMMUNITY)
Admission: RE | Admit: 2020-08-19 | Discharge: 2020-08-19 | Disposition: A | Discharge status: Home / Self Care | Payer: Managed Care, Other (non HMO) | Source: Ambulatory Visit | Attending: Infectious Disease | Admitting: Infectious Disease

## 2020-08-19 ENCOUNTER — Ambulatory Visit (INDEPENDENT_AMBULATORY_CARE_PROVIDER_SITE_OTHER): Payer: Managed Care, Other (non HMO) | Admitting: Infectious Disease

## 2020-08-19 ENCOUNTER — Encounter: Payer: Self-pay | Admitting: Infectious Disease

## 2020-08-19 ENCOUNTER — Other Ambulatory Visit: Payer: Self-pay

## 2020-08-19 VITALS — BP 127/75 | HR 98 | Resp 16 | Ht 63.0 in | Wt 129.0 lb

## 2020-08-19 DIAGNOSIS — Z91199 Patient's noncompliance with other medical treatment and regimen due to unspecified reason: Secondary | ICD-10-CM

## 2020-08-19 DIAGNOSIS — B259 Cytomegaloviral disease, unspecified: Secondary | ICD-10-CM

## 2020-08-19 DIAGNOSIS — F330 Major depressive disorder, recurrent, mild: Secondary | ICD-10-CM

## 2020-08-19 DIAGNOSIS — I10 Essential (primary) hypertension: Secondary | ICD-10-CM | POA: Diagnosis not present

## 2020-08-19 DIAGNOSIS — B20 Human immunodeficiency virus [HIV] disease: Secondary | ICD-10-CM | POA: Diagnosis present

## 2020-08-19 DIAGNOSIS — Z9119 Patient's noncompliance with other medical treatment and regimen: Secondary | ICD-10-CM

## 2020-08-19 DIAGNOSIS — Z630 Problems in relationship with spouse or partner: Secondary | ICD-10-CM | POA: Diagnosis not present

## 2020-08-19 DIAGNOSIS — D702 Other drug-induced agranulocytosis: Secondary | ICD-10-CM

## 2020-08-19 DIAGNOSIS — K208 Other esophagitis without bleeding: Secondary | ICD-10-CM

## 2020-08-19 NOTE — Progress Notes (Signed)
Subjective:   Chief complaint: followup for her HIV disease on SYMTUZA   Patient ID: Samantha Kline, female    DOB: January 25, 1961, 60 y.o.   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  She had protracted stay and eventually went to inpatient rehab. She was seen several times by Palliative care.   She completed 21 days of GCV for her CMV esophagitis supported by G-CSF for part of her therapy. She did continue to have neutropenia, leukopenia after coming off the GCV and her G-CSF.   Her virus responded very nicely to Tivicay and Descovy and then  BIKTARVY.  She had been perfectly controlled for years but now suddenly with a VL of above 80k  She is here again for followup with her husband who has tested negative for HIV repeatedly.   She has been getting meds shipped from [facility]. She denied missing for consistent periods but sounds to now be only taking meds in a haphazard way which makes me concerned that she might have virological failure with resistance.  Her husband who is here for the visit as well told me they have had sexual intercourse a few times in the last 4 months including most recently the night before they came to visit me without a condom.  She attributed the drop in adherence to only with stressors with her children and also in a relationship with her husband.  Now however she seems to be attributing this issue to side effects associated with BIKTARVY which seems rather strange to me.  She says she is much happier now with [facility].  When asked her and her husband about whether they had received counseling together they said no.  They were not interested in counseling and each one of them refused referral to counseling here.  Note her genotype done at last visit showed no resistance to any antiretrovirals consistent with her being  nonadherent to her medical therapy.   Past Medical History:  Diagnosis Date  . Anemia   . HIV (human immunodeficiency virus infection) (HCC)    11/06/15 Family states it was just diagnosed this week  . Pneumonia 10/24/2015  . Rash and nonspecific skin eruption 02/21/2016  . Vitamin D deficiency     Past Surgical History:  Procedure Laterality Date  . CLOSED REDUCTION FINGER WITH PERCUTANEOUS PINNING Left 12/18/2015   Procedure: Closed reduction of Left long finger PIP dislocation;  Surgeon: Dairl Ponder, MD;  Location: [facility] OR;  Service: Orthopedics;  Laterality: Left;  . ESOPHAGOGASTRODUODENOSCOPY N/A 11/23/2015   Procedure: ESOPHAGOGASTRODUODENOSCOPY (EGD);  Surgeon: Vida Rigger, MD;  Location: [facility] ENDOSCOPY;  Service: Endoscopy;  Laterality: N/A;  . TUBAL LIGATION  1980s    Family History  Problem Relation Age of Onset  . Diabetes Father   . Heart attack Brother   . Hypertension Sister       Social History   Socioeconomic History  . Marital status: Married    Spouse name: Not on file  . Number of children: Not on file  . Years of education: Not on file  . Highest education level: Not on file  Occupational History  . Not on file  Tobacco Use  . Smoking status: Never Smoker  . Smokeless tobacco: Never Used  Substance and Sexual Activity  . Alcohol use: No    Comment: 10/24/2015 "nothing since the 1980s"  .  Drug use: No  . Sexual activity: Not on file  Other Topics Concern  . Not on file  Social History Narrative  . Not on file   Social Determinants of Health   Financial Resource Strain: Not on file  Food Insecurity: Not on file  Transportation Needs: Not on file  Physical Activity: Not on file  Stress: Not on file  Social Connections: Not on file    Allergies  Allergen Reactions  . Septra [Sulfamethoxazole-Trimethoprim] Anaphylaxis    Possible cause of hyponatremia Possible cause of hyponatremia     Current Outpatient Medications:  .  Cholecalciferol  25 MCG (1000 UT) tablet, Take by mouth., Disp: , Rfl:  .  Darunavir-Cobicisctat-Emtricitabine-Tenofovir Alafenamide (SYMTUZA) 800-150-200-10 MG TABS, Take 1 tablet by mouth daily with breakfast., Disp: 30 tablet, Rfl: 11 .  vitamin B-12 (CYANOCOBALAMIN) 100 MCG tablet, Take 1 tablet (100 mcg total) by mouth daily., Disp: 30 tablet, Rfl: 0   Review of Systems  Constitutional: Negative for chills, diaphoresis and fever.  HENT: Negative for congestion, hearing loss, sore throat and tinnitus.   Eyes: Negative for photophobia.  Respiratory: Negative for cough, chest tightness, shortness of breath and wheezing.   Cardiovascular: Negative for chest pain, palpitations and leg swelling.  Gastrointestinal: Negative for abdominal pain, blood in stool, constipation, diarrhea, nausea and vomiting.  Genitourinary: Negative for dysuria, flank pain and hematuria.  Musculoskeletal: Negative for back pain and myalgias.  Skin: Negative for rash.  Neurological: Negative for dizziness, weakness and headaches.  Hematological: Does not bruise/bleed easily.  Psychiatric/Behavioral: Positive for dysphoric mood. Negative for agitation, behavioral problems, confusion, hallucinations and suicidal ideas. The patient is not nervous/anxious.        Objective:   Physical Exam Vitals and nursing note reviewed.  Constitutional:      General: She is not in acute distress.    Appearance: She is well-developed. She is not diaphoretic.  HENT:     Head: Normocephalic and atraumatic.     Mouth/Throat:     Pharynx: Uvula midline. No oropharyngeal exudate.  Eyes:     General: No scleral icterus.    Conjunctiva/sclera: Conjunctivae normal.  Cardiovascular:     Rate and Rhythm: Normal rate and regular rhythm.  Pulmonary:     Breath sounds: Normal breath sounds. No wheezing.  Abdominal:     General: There is no distension.     Palpations: Abdomen is soft.  Musculoskeletal:        General: No tenderness or deformity.      Cervical back: Normal range of motion and neck supple.  Lymphadenopathy:     Cervical: No cervical adenopathy.  Skin:    General: Skin is warm and dry.     Coloration: Skin is not pale.     Findings: No erythema or rash.  Neurological:     Mental Status: She is alert and oriented to person, place, and time.     Motor: No abnormal muscle tone.     Coordination: Coordination normal.  Psychiatric:        Mood and Affect: Mood is depressed.        Speech: Speech normal.        Behavior: Behavior normal.        Thought Content: Thought content normal.        Judgment: Judgment normal.     Comments: Poor eye contact      Assessment & Plan:    HIV/AIDS: She had virological failure but without resistance due  to nonadherence.  We will continue SYMTUZA for now given its highest barrier of resistance of compounds we have.   Depression and problems in the relationship: I continue to counsel him to get into therapy individually and together but they refused.  I spent greater than 30 minutes with the patient including greater than 50% of time in face to face counsel of the patient the nature of her HIV disease need to be highly adherent, regarding depressive symptoms marital issues and the coordination of her care.

## 2020-08-22 LAB — COMPLETE METABOLIC PANEL WITH GFR
AG Ratio: 1 (calc) (ref 1.0–2.5)
ALT: 12 U/L (ref 6–29)
AST: 21 U/L (ref 10–35)
Albumin: 3.9 g/dL (ref 3.6–5.1)
Alkaline phosphatase (APISO): 75 U/L (ref 37–153)
BUN: 13 mg/dL (ref 7–25)
CO2: 27 mmol/L (ref 20–32)
Calcium: 9.2 mg/dL (ref 8.6–10.4)
Chloride: 101 mmol/L (ref 98–110)
Creat: 0.86 mg/dL (ref 0.50–0.99)
GFR, Est African American: 85 mL/min/{1.73_m2} (ref >=60)
GFR, Est Non African American: 73 mL/min/{1.73_m2} (ref >=60)
Globulin: 3.9 g/dL (calc) — ABNORMAL HIGH (ref 1.9–3.7)
Glucose, Bld: 75 mg/dL (ref 65–99)
Potassium: 4.4 mmol/L (ref 3.5–5.3)
Sodium: 136 mmol/L (ref 135–146)
Total Bilirubin: 0.4 mg/dL (ref 0.2–1.2)
Total Protein: 7.8 g/dL (ref 6.1–8.1)

## 2020-08-22 LAB — CBC WITH DIFFERENTIAL/PLATELET
Absolute Monocytes: 539 cells/uL (ref 200–950)
Basophils Absolute: 20 cells/uL (ref 0–200)
Basophils Relative: 0.4 %
Eosinophils Absolute: 69 cells/uL (ref 15–500)
Eosinophils Relative: 1.4 %
HCT: 33.6 % — ABNORMAL LOW (ref 35.0–45.0)
Hemoglobin: 11.1 g/dL — ABNORMAL LOW (ref 11.7–15.5)
Lymphs Abs: 1093 cells/uL (ref 850–3900)
MCH: 30.4 pg (ref 27.0–33.0)
MCHC: 33 g/dL (ref 32.0–36.0)
MCV: 92.1 fL (ref 80.0–100.0)
MPV: 10.6 fL (ref 7.5–12.5)
Monocytes Relative: 11 %
Neutro Abs: 3180 cells/uL (ref 1500–7800)
Neutrophils Relative %: 64.9 %
Platelets: 227 10*3/uL (ref 140–400)
RBC: 3.65 10*6/uL — ABNORMAL LOW (ref 3.80–5.10)
RDW: 14.5 % (ref 11.0–15.0)
Total Lymphocyte: 22.3 %
WBC: 4.9 10*3/uL (ref 3.8–10.8)

## 2020-08-22 LAB — RPR: RPR Ser Ql: NONREACTIVE

## 2020-08-22 LAB — URINE CYTOLOGY ANCILLARY ONLY
Chlamydia: NEGATIVE
Comment: NEGATIVE
Comment: NORMAL
Neisseria Gonorrhea: NEGATIVE

## 2020-08-22 LAB — T-HELPER CELLS (CD4) COUNT (NOT AT ARMC)
Absolute CD4: 294 cells/uL — ABNORMAL LOW (ref 490–1740)
CD4 T Helper %: 20 % — ABNORMAL LOW (ref 30–61)
Total lymphocyte count: 1448 cells/uL (ref 850–3900)

## 2020-08-22 LAB — HIV-1 RNA QUANT-NO REFLEX-BLD
HIV 1 RNA Quant: 301 Copies/mL — ABNORMAL HIGH
HIV-1 RNA Quant, Log: 2.48 Log cps/mL — ABNORMAL HIGH

## 2020-09-21 ENCOUNTER — Encounter: Payer: Self-pay | Admitting: Infectious Disease

## 2020-09-21 ENCOUNTER — Other Ambulatory Visit (HOSPITAL_COMMUNITY)
Admission: RE | Admit: 2020-09-21 | Discharge: 2020-09-21 | Disposition: A | Discharge status: Home / Self Care | Payer: Managed Care, Other (non HMO) | Source: Ambulatory Visit | Attending: Infectious Disease | Admitting: Infectious Disease

## 2020-09-21 ENCOUNTER — Ambulatory Visit (INDEPENDENT_AMBULATORY_CARE_PROVIDER_SITE_OTHER): Payer: Managed Care, Other (non HMO) | Admitting: Infectious Disease

## 2020-09-21 ENCOUNTER — Other Ambulatory Visit: Payer: Self-pay

## 2020-09-21 ENCOUNTER — Ambulatory Visit: Payer: Managed Care, Other (non HMO) | Admitting: Infectious Disease

## 2020-09-21 VITALS — BP 111/74 | HR 91 | Temp 98.0°F | Ht 63.0 in | Wt 122.2 lb

## 2020-09-21 DIAGNOSIS — K208 Other esophagitis without bleeding: Secondary | ICD-10-CM | POA: Diagnosis not present

## 2020-09-21 DIAGNOSIS — B20 Human immunodeficiency virus [HIV] disease: Secondary | ICD-10-CM

## 2020-09-21 DIAGNOSIS — B259 Cytomegaloviral disease, unspecified: Secondary | ICD-10-CM | POA: Diagnosis not present

## 2020-09-21 NOTE — Progress Notes (Signed)
Subjective:   Chief complaint: followup for her HIV disease on SYMTUZA   Patient ID: Samantha Kline, female    DOB: 1961/04/07, 60 y.o.   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  She had protracted stay and eventually went to inpatient rehab. She was seen several times by Palliative care.   She completed 21 days of GCV for her CMV esophagitis supported by G-CSF for part of her therapy. She did continue to have neutropenia, leukopenia after coming off the GCV and her G-CSF.   Her virus responded very nicely to Tivicay and Descovy and then  BIKTARVY.  She had been perfectly controlled for years but now suddenly with a VL of above 80k  She is here again for followup with her husband who has tested negative for HIV repeatedly.   She has been getting meds shipped from [facility]. She denied missing for consistent periods but sounds to now be only taking meds in a haphazard way which makes me concerned that she might have virological failure with resistance.  Her husband who is here for the visit as well told me they have had sexual intercourse a few times in the last 4 months including most recently the night before they came to visit me without a condom.  She attributed the drop in adherence to only with stressors with her children and also in a relationship with her husband.  Now however she seems to be attributing this issue to side effects associated with BIKTARVY which seems rather strange to me.  She me at last visit she is much happier now with [facility].  Risser that today again.   Note her genotype done at last visit showed no resistance to any antiretrovirals consistent with her being nonadherent to her medical therapy of the high viral load.  Fortunately SYMTUZA she has come back and suppressed her virus down into the 300s.  She returns to  clinic for visit today we will recheck labs today   Past Medical History:  Diagnosis Date  . Anemia   . HIV (human immunodeficiency virus infection) (HCC)    11/06/15 Family states it was just diagnosed this week  . Pneumonia 10/24/2015  . Rash and nonspecific skin eruption 02/21/2016  . Vitamin D deficiency     Past Surgical History:  Procedure Laterality Date  . CLOSED REDUCTION FINGER WITH PERCUTANEOUS PINNING Left 12/18/2015   Procedure: Closed reduction of Left long finger PIP dislocation;  Surgeon: Dairl Ponder, MD;  Location: [facility] OR;  Service: Orthopedics;  Laterality: Left;  . ESOPHAGOGASTRODUODENOSCOPY N/A 11/23/2015   Procedure: ESOPHAGOGASTRODUODENOSCOPY (EGD);  Surgeon: Vida Rigger, MD;  Location: [facility] ENDOSCOPY;  Service: Endoscopy;  Laterality: N/A;  . TUBAL LIGATION  1980s    Family History  Problem Relation Age of Onset  . Diabetes Father   . Heart attack Brother   . Hypertension Sister       Social History   Socioeconomic History  . Marital status: Married    Spouse name: Not on file  . Number of children: Not on file  . Years of education: Not on file  . Highest education level: Not on file  Occupational History  . Not on file  Tobacco Use  . Smoking status: Never Smoker  . Smokeless tobacco: Never Used  Substance and Sexual Activity  . Alcohol use: No  Comment: 10/24/2015 "nothing since the 1980s"  . Drug use: No  . Sexual activity: Not on file  Other Topics Concern  . Not on file  Social History Narrative  . Not on file   Social Determinants of Health   Financial Resource Strain: Not on file  Food Insecurity: Not on file  Transportation Needs: Not on file  Physical Activity: Not on file  Stress: Not on file  Social Connections: Not on file    Allergies  Allergen Reactions  . Septra [Sulfamethoxazole-Trimethoprim] Anaphylaxis    Possible cause of hyponatremia Possible cause of hyponatremia     Current Outpatient Medications:  .   Cholecalciferol 25 MCG (1000 UT) tablet, Take by mouth., Disp: , Rfl:  .  Darunavir-Cobicisctat-Emtricitabine-Tenofovir Alafenamide (SYMTUZA) 800-150-200-10 MG TABS, Take 1 tablet by mouth daily with breakfast., Disp: 30 tablet, Rfl: 11 .  vitamin B-12 (CYANOCOBALAMIN) 100 MCG tablet, Take 1 tablet (100 mcg total) by mouth daily., Disp: 30 tablet, Rfl: 0   Review of Systems  Constitutional: Negative for chills, diaphoresis and fever.  HENT: Negative for congestion, hearing loss, sore throat and tinnitus.   Eyes: Negative for photophobia.  Respiratory: Negative for cough, chest tightness, shortness of breath and wheezing.   Cardiovascular: Negative for chest pain, palpitations and leg swelling.  Gastrointestinal: Negative for abdominal pain, blood in stool, constipation, diarrhea, nausea and vomiting.  Genitourinary: Negative for dysuria, flank pain and hematuria.  Musculoskeletal: Negative for back pain and myalgias.  Skin: Negative for rash.  Neurological: Negative for dizziness, weakness and headaches.  Hematological: Does not bruise/bleed easily.  Psychiatric/Behavioral: Negative for agitation, behavioral problems, confusion, dysphoric mood, hallucinations and suicidal ideas. The patient is not nervous/anxious.        Objective:   Physical Exam Vitals and nursing note reviewed.  Constitutional:      General: She is not in acute distress.    Appearance: She is well-developed. She is not diaphoretic.  HENT:     Head: Normocephalic and atraumatic.     Mouth/Throat:     Pharynx: Uvula midline. No oropharyngeal exudate.  Eyes:     General: No scleral icterus.    Conjunctiva/sclera: Conjunctivae normal.  Cardiovascular:     Rate and Rhythm: Normal rate and regular rhythm.  Pulmonary:     Breath sounds: Normal breath sounds. No wheezing.  Abdominal:     General: There is no distension.     Palpations: Abdomen is soft.  Musculoskeletal:        General: No tenderness or deformity.      Cervical back: Normal range of motion and neck supple.  Lymphadenopathy:     Cervical: No cervical adenopathy.  Skin:    General: Skin is warm and dry.     Coloration: Skin is not pale.     Findings: No erythema or rash.  Neurological:     Mental Status: She is alert and oriented to person, place, and time.     Motor: No abnormal muscle tone.     Coordination: Coordination normal.  Psychiatric:        Speech: Speech normal.        Behavior: Behavior normal.        Thought Content: Thought content normal.        Judgment: Judgment normal.     Comments: Poor eye contact      Assessment & Plan:    HIV/AIDS: She had virological failure but without resistance due to nonadherence.  We will continue [facility]  for now given its highest barrier of resistance of compounds we have.  We will recheck labs today and hopefully VL <20   I spent greater than 30 minutes with the patient including greater than 50% of time in face to face counsel of the patient the nature of her HIV disease need to be highly adherent, and the coordinationof her care.

## 2020-09-22 LAB — T-HELPER CELL (CD4) - (RCID CLINIC ONLY)
CD4 % Helper T Cell: 16 % — ABNORMAL LOW (ref 33–65)
CD4 T Cell Abs: 256 /uL — ABNORMAL LOW (ref 400–1790)

## 2020-09-23 LAB — COMPLETE METABOLIC PANEL WITH GFR
AG Ratio: 1.1 (calc) (ref 1.0–2.5)
ALT: 12 U/L (ref 6–29)
AST: 24 U/L (ref 10–35)
Albumin: 4.2 g/dL (ref 3.6–5.1)
Alkaline phosphatase (APISO): 63 U/L (ref 37–153)
BUN/Creatinine Ratio: 15 (calc) (ref 6–22)
BUN: 16 mg/dL (ref 7–25)
CO2: 25 mmol/L (ref 20–32)
Calcium: 9.5 mg/dL (ref 8.6–10.4)
Chloride: 103 mmol/L (ref 98–110)
Creat: 1.07 mg/dL — ABNORMAL HIGH (ref 0.50–0.99)
GFR, Est African American: 65 mL/min/{1.73_m2} (ref >=60)
GFR, Est Non African American: 56 mL/min/{1.73_m2} — ABNORMAL LOW (ref >=60)
Globulin: 4 g/dL (calc) — ABNORMAL HIGH (ref 1.9–3.7)
Glucose, Bld: 72 mg/dL (ref 65–99)
Potassium: 4.3 mmol/L (ref 3.5–5.3)
Sodium: 138 mmol/L (ref 135–146)
Total Bilirubin: 0.6 mg/dL (ref 0.2–1.2)
Total Protein: 8.2 g/dL — ABNORMAL HIGH (ref 6.1–8.1)

## 2020-09-23 LAB — CBC WITH DIFFERENTIAL/PLATELET
Absolute Monocytes: 605 cells/uL (ref 200–950)
Basophils Absolute: 28 cells/uL (ref 0–200)
Basophils Relative: 0.5 %
Eosinophils Absolute: 50 cells/uL (ref 15–500)
Eosinophils Relative: 0.9 %
HCT: 37.8 % (ref 35.0–45.0)
Hemoglobin: 12.3 g/dL (ref 11.7–15.5)
Lymphs Abs: 1674 cells/uL (ref 850–3900)
MCH: 30 pg (ref 27.0–33.0)
MCHC: 32.5 g/dL (ref 32.0–36.0)
MCV: 92.2 fL (ref 80.0–100.0)
MPV: 10.2 fL (ref 7.5–12.5)
Monocytes Relative: 10.8 %
Neutro Abs: 3242 cells/uL (ref 1500–7800)
Neutrophils Relative %: 57.9 %
Platelets: 252 10*3/uL (ref 140–400)
RBC: 4.1 10*6/uL (ref 3.80–5.10)
RDW: 14.2 % (ref 11.0–15.0)
Total Lymphocyte: 29.9 %
WBC: 5.6 10*3/uL (ref 3.8–10.8)

## 2020-09-23 LAB — HIV-1 RNA QUANT-NO REFLEX-BLD
HIV 1 RNA Quant: 84 Copies/mL — ABNORMAL HIGH
HIV-1 RNA Quant, Log: 1.93 Log cps/mL — ABNORMAL HIGH

## 2020-09-23 LAB — URINE CYTOLOGY ANCILLARY ONLY
Chlamydia: NEGATIVE
Comment: NEGATIVE
Comment: NORMAL
Neisseria Gonorrhea: NEGATIVE

## 2020-09-23 LAB — RPR: RPR Ser Ql: NONREACTIVE

## 2021-01-16 ENCOUNTER — Other Ambulatory Visit (HOSPITAL_COMMUNITY): Payer: Self-pay

## 2021-01-16 ENCOUNTER — Telehealth: Payer: Self-pay

## 2021-01-16 ENCOUNTER — Ambulatory Visit: Payer: Managed Care, Other (non HMO) | Admitting: Infectious Disease

## 2021-01-16 NOTE — Telephone Encounter (Signed)
[facility] Patient Advocate Encounter   Was successful in obtaining a [employer] copay card for Symtuza.  This copay card will make the patients copay 0.00.  I have spoken with the patient.         Ibraheem Voris, CPhT Specialty Pharmacy [facility] Phone: [phone] Fax:  [phone]  

## 2021-02-27 ENCOUNTER — Ambulatory Visit: Payer: Managed Care, Other (non HMO) | Admitting: Infectious Disease

## 2021-03-29 ENCOUNTER — Ambulatory Visit: Payer: Managed Care, Other (non HMO) | Admitting: Infectious Disease

## 2021-08-07 ENCOUNTER — Other Ambulatory Visit: Payer: Self-pay

## 2021-08-07 ENCOUNTER — Ambulatory Visit (INDEPENDENT_AMBULATORY_CARE_PROVIDER_SITE_OTHER): Payer: Managed Care, Other (non HMO) | Admitting: Infectious Disease

## 2021-08-07 ENCOUNTER — Encounter: Payer: Self-pay | Admitting: Infectious Disease

## 2021-08-07 VITALS — BP 137/78 | HR 98 | Temp 98.1°F | Ht 64.0 in | Wt 120.0 lb

## 2021-08-07 DIAGNOSIS — E559 Vitamin D deficiency, unspecified: Secondary | ICD-10-CM

## 2021-08-07 DIAGNOSIS — G9340 Encephalopathy, unspecified: Secondary | ICD-10-CM | POA: Diagnosis not present

## 2021-08-07 DIAGNOSIS — B20 Human immunodeficiency virus [HIV] disease: Secondary | ICD-10-CM

## 2021-08-07 MED ORDER — SYMTUZA 800-150-200-10 MG PO TABS: 1.0000 | ORAL_TABLET | Freq: Every day | ORAL | 11 refills | Status: DC

## 2021-08-07 NOTE — Progress Notes (Signed)
? ?Subjective:  ? ?Chief complaint: Follow-up for HIV disease on medications ? Patient ID: Samantha Kline, female    DOB: [birth year], 61 y.o.   MRN: [id number] ? ?HPI ? 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 ? ?She had protracted stay and eventually went to inpatient rehab. She was seen several times by Palliative care.  ? ?She completed 21 days of GCV for her CMV esophagitis supported by G-CSF for part of her therapy. She did continue to have neutropenia, leukopenia after coming off the GCV and her G-CSF.  ? ?Her virus responded very nicely to Tivicay and Descovy and then  BIKTARVY. ? ?She then became viremic again last March 2022 and I switched her over to [facility] which she has been maintained on since then.  Her husband was also on HIV preexposure prophylaxis but has no longer been coming for PrEP visit. ? ?Patient has filled out paperwork for patient assistance foundation and brought this with her today. ? ? ? ? ? ?Past Medical History:  ?Diagnosis Date  ? Anemia   ? HIV (human immunodeficiency virus infection) (HCC)   ? 11/06/15 Family states it was just diagnosed this week  ? Pneumonia 10/24/2015  ? Rash and nonspecific skin eruption 02/21/2016  ? Vitamin D deficiency   ? ? ?Past Surgical History:  ?Procedure Laterality Date  ? CLOSED REDUCTION FINGER WITH PERCUTANEOUS PINNING Left 12/18/2015  ? Procedure: Closed reduction of Left long finger PIP dislocation;  Surgeon: Dairl Ponder, MD;  Location: [facility] OR;  Service: Orthopedics;  Laterality: Left;  ? ESOPHAGOGASTRODUODENOSCOPY N/A 11/23/2015  ? Procedure: ESOPHAGOGASTRODUODENOSCOPY (EGD);  Surgeon: Vida Rigger, MD;  Location: [facility] ENDOSCOPY;  Service: Endoscopy;  Laterality: N/A;  ? TUBAL LIGATION  1980s  ? ? ?Family History  ?Problem Relation Age of Onset  ? Diabetes Father   ? Heart attack Brother   ? Hypertension Sister   ? ? ?  ?Social History  ? ?Socioeconomic  History  ? Marital status: Married  ?  Spouse name: Not on file  ? Number of children: Not on file  ? Years of education: Not on file  ? Highest education level: Not on file  ?Occupational History  ? Not on file  ?Tobacco Use  ? Smoking status: Never  ? Smokeless tobacco: Never  ?Substance and Sexual Activity  ? Alcohol use: No  ?  Comment: 10/24/2015 "nothing since the 1980s"  ? Drug use: No  ? Sexual activity: Not on file  ?Other Topics Concern  ? Not on file  ?Social History Narrative  ? Not on file  ? ?Social Determinants of Health  ? ?Financial Resource Strain: Not on file  ?Food Insecurity: Not on file  ?Transportation Needs: Not on file  ?Physical Activity: Not on file  ?Stress: Not on file  ?Social Connections: Not on file  ? ? ?Allergies  ?Allergen Reactions  ? Septra [Sulfamethoxazole-Trimethoprim] Anaphylaxis  ?  Possible cause of hyponatremia ?Possible cause of hyponatremia  ? ? ? ?Current Outpatient Medications:  ?  Cholecalciferol 25 MCG (1000 UT) tablet, Take by mouth., Disp: , Rfl:  ?  Darunavir-Cobicisctat-Emtricitabine-Tenofovir Alafenamide (SYMTUZA) 800-150-200-10 MG TABS, Take 1 tablet by mouth daily with breakfast., Disp: 30 tablet, Rfl: 11 ?  vitamin B-12 (CYANOCOBALAMIN) 100 MCG tablet, Take 1 tablet (100 mcg total) by mouth daily., Disp: 30 tablet, Rfl: 0 ? ? ?Review of Systems  ?Constitutional:  Negative for  activity change, appetite change, chills, diaphoresis, fatigue, fever and unexpected weight change.  ?HENT:  Negative for congestion, hearing loss, rhinorrhea, sinus pressure, sneezing, sore throat, tinnitus and trouble swallowing.   ?Eyes:  Negative for photophobia and visual disturbance.  ?Respiratory:  Negative for cough, chest tightness, shortness of breath, wheezing and stridor.   ?Cardiovascular:  Negative for chest pain, palpitations and leg swelling.  ?Gastrointestinal:  Negative for abdominal distention, abdominal pain, anal bleeding, blood in stool, constipation, diarrhea, nausea  and vomiting.  ?Genitourinary:  Negative for difficulty urinating, dysuria, flank pain and hematuria.  ?Musculoskeletal:  Negative for arthralgias, back pain, gait problem, joint swelling and myalgias.  ?Skin:  Negative for color change, pallor, rash and wound.  ?Neurological:  Negative for dizziness, tremors, weakness, light-headedness and headaches.  ?Hematological:  Negative for adenopathy. Does not bruise/bleed easily.  ?Psychiatric/Behavioral:  Negative for agitation, behavioral problems, confusion, decreased concentration, dysphoric mood, hallucinations, sleep disturbance and suicidal ideas. The patient is not nervous/anxious.   ? ?   ?Objective:  ? Physical Exam ?Vitals reviewed.  ?Constitutional:   ?   Appearance: Normal appearance.  ?HENT:  ?   Head: Normocephalic and atraumatic.  ?Eyes:  ?   Extraocular Movements: Extraocular movements intact.  ?   Conjunctiva/sclera: Conjunctivae normal.  ?Cardiovascular:  ?   Rate and Rhythm: Normal rate and regular rhythm.  ?Pulmonary:  ?   Effort: Pulmonary effort is normal. No respiratory distress.  ?   Breath sounds: No wheezing.  ?Abdominal:  ?   General: There is no distension.  ?Musculoskeletal:     ?   General: Normal range of motion.  ?Skin: ?   General: Skin is warm and dry.  ?Neurological:  ?   General: No focal deficit present.  ?   Mental Status: She is alert and oriented to person, place, and time.  ?Psychiatric:     ?   Mood and Affect: Mood normal.     ?   Behavior: Behavior normal.     ?   Thought Content: Thought content normal.     ?   Judgment: Judgment normal.  ? ? ? ?Assessment & Plan:  ? ?HIV disease: at one point with severe disease, AIDS, then became viremic again last year but fortunately has been suppressed more recently we will check repeat viral load today CD4 CBC with differential I have sent new prescription for SYMTUZA. ? ?Vitamin D deficiency : continue vitamin D ? ? ? ?

## 2021-08-09 LAB — HELPER T-LYMPH-CD4 (ARMC ONLY)
% CD 4 Pos. Lymph.: 22.1 % — ABNORMAL LOW (ref 30.8–58.5)
Absolute CD 4 Helper: 287 /uL — ABNORMAL LOW (ref 359–1519)
Basophils Absolute: 0 10*3/uL (ref 0.0–0.2)
Basos: 1 %
EOS (ABSOLUTE): 0 10*3/uL (ref 0.0–0.4)
Eos: 1 %
Hematocrit: 35.6 % (ref 34.0–46.6)
Hemoglobin: 11.6 g/dL (ref 11.1–15.9)
Immature Grans (Abs): 0 10*3/uL (ref 0.0–0.1)
Immature Granulocytes: 0 %
Lymphocytes Absolute: 1.3 10*3/uL (ref 0.7–3.1)
Lymphs: 23 %
MCH: 31.4 pg (ref 26.6–33.0)
MCHC: 32.6 g/dL (ref 31.5–35.7)
MCV: 97 fL (ref 79–97)
Monocytes Absolute: 0.5 10*3/uL (ref 0.1–0.9)
Monocytes: 9 %
Neutrophils Absolute: 3.7 10*3/uL (ref 1.4–7.0)
Neutrophils: 66 %
Platelets: 316 10*3/uL (ref 150–450)
RBC: 3.69 x10E6/uL — ABNORMAL LOW (ref 3.77–5.28)
RDW: 14.4 % (ref 11.7–15.4)
WBC: 5.5 10*3/uL (ref 3.4–10.8)

## 2021-08-09 LAB — T-HELPER CELLS (CD4) COUNT (NOT AT ARMC)

## 2021-08-11 LAB — CBC WITH DIFFERENTIAL/PLATELET
Absolute Monocytes: 510 cells/uL (ref 200–950)
Basophils Absolute: 22 cells/uL (ref 0–200)
Basophils Relative: 0.4 %
Eosinophils Absolute: 39 cells/uL (ref 15–500)
Eosinophils Relative: 0.7 %
HCT: 34 % — ABNORMAL LOW (ref 35.0–45.0)
Hemoglobin: 11.6 g/dL — ABNORMAL LOW (ref 11.7–15.5)
Lymphs Abs: 1406 cells/uL (ref 850–3900)
MCH: 32.2 pg (ref 27.0–33.0)
MCHC: 34.1 g/dL (ref 32.0–36.0)
MCV: 94.4 fL (ref 80.0–100.0)
MPV: 10.4 fL (ref 7.5–12.5)
Monocytes Relative: 9.1 %
Neutro Abs: 3623 cells/uL (ref 1500–7800)
Neutrophils Relative %: 64.7 %
Platelets: 309 10*3/uL (ref 140–400)
RBC: 3.6 10*6/uL — ABNORMAL LOW (ref 3.80–5.10)
RDW: 14.4 % (ref 11.0–15.0)
Total Lymphocyte: 25.1 %
WBC: 5.6 10*3/uL (ref 3.8–10.8)

## 2021-08-11 LAB — COMPLETE METABOLIC PANEL WITH GFR
AG Ratio: 1.1 (calc) (ref 1.0–2.5)
ALT: 12 U/L (ref 6–29)
AST: 16 U/L (ref 10–35)
Albumin: 3.8 g/dL (ref 3.6–5.1)
Alkaline phosphatase (APISO): 103 U/L (ref 37–153)
BUN: 9 mg/dL (ref 7–25)
CO2: 26 mmol/L (ref 20–32)
Calcium: 8.9 mg/dL (ref 8.6–10.4)
Chloride: 98 mmol/L (ref 98–110)
Creat: 0.97 mg/dL (ref 0.50–1.05)
Globulin: 3.5 g/dL (calc) (ref 1.9–3.7)
Glucose, Bld: 96 mg/dL (ref 65–99)
Potassium: 3.3 mmol/L — ABNORMAL LOW (ref 3.5–5.3)
Sodium: 134 mmol/L — ABNORMAL LOW (ref 135–146)
Total Bilirubin: 0.4 mg/dL (ref 0.2–1.2)
Total Protein: 7.3 g/dL (ref 6.1–8.1)
eGFR: 66 mL/min/{1.73_m2} (ref >=60)

## 2021-08-11 LAB — HIV-1 RNA ULTRAQUANT REFLEX TO GENTYP+
HIV 1 RNA Quant: NOT DETECTED copies/mL
HIV-1 RNA Quant, Log: NOT DETECTED Log copies/mL

## 2021-08-11 LAB — RPR: RPR Ser Ql: NONREACTIVE

## 2021-10-16 ENCOUNTER — Ambulatory Visit (INDEPENDENT_AMBULATORY_CARE_PROVIDER_SITE_OTHER): Payer: Managed Care, Other (non HMO) | Admitting: Infectious Disease

## 2021-10-16 ENCOUNTER — Other Ambulatory Visit (HOSPITAL_COMMUNITY)
Admission: RE | Admit: 2021-10-16 | Discharge: 2021-10-16 | Disposition: A | Discharge status: Home / Self Care | Payer: Managed Care, Other (non HMO) | Source: Ambulatory Visit | Attending: Infectious Disease | Admitting: Infectious Disease

## 2021-10-16 ENCOUNTER — Encounter: Payer: Self-pay | Admitting: Infectious Disease

## 2021-10-16 ENCOUNTER — Other Ambulatory Visit: Payer: Self-pay

## 2021-10-16 ENCOUNTER — Other Ambulatory Visit (HOSPITAL_COMMUNITY): Payer: Self-pay

## 2021-10-16 VITALS — BP 137/77 | HR 64 | Temp 98.1°F | Ht 64.0 in | Wt 129.0 lb

## 2021-10-16 DIAGNOSIS — E785 Hyperlipidemia, unspecified: Secondary | ICD-10-CM

## 2021-10-16 DIAGNOSIS — B20 Human immunodeficiency virus [HIV] disease: Secondary | ICD-10-CM | POA: Diagnosis present

## 2021-10-16 HISTORY — DX: Hyperlipidemia, unspecified: E78.5

## 2021-10-16 MED ORDER — ATORVASTATIN CALCIUM 10 MG PO TABS
10.0000 mg | ORAL_TABLET | Freq: Every day | ORAL | 11 refills | Status: DC
  Filled 2021-10-16: qty 30, 30d supply, fill #0

## 2021-10-16 MED ORDER — ATORVASTATIN CALCIUM 10 MG PO TABS: 10.0000 mg | ORAL_TABLET | Freq: Every day | ORAL | 11 refills | Status: AC

## 2021-10-16 MED ORDER — SYMTUZA 800-150-200-10 MG PO TABS: 1.0000 | ORAL_TABLET | Freq: Every day | ORAL | 3 refills | Status: AC

## 2021-10-17 LAB — T-HELPER CELLS (CD4) COUNT (NOT AT ARMC)
CD4 % Helper T Cell: 20 % — ABNORMAL LOW (ref 33–65)
CD4 T Cell Abs: 315 /uL — ABNORMAL LOW (ref 400–1790)

## 2021-10-17 LAB — URINE CYTOLOGY ANCILLARY ONLY
Chlamydia: NEGATIVE
Comment: NEGATIVE
Comment: NORMAL
Neisseria Gonorrhea: NEGATIVE

## 2021-10-18 LAB — COMPLETE METABOLIC PANEL WITH GFR
AG Ratio: 1.1 (calc) (ref 1.0–2.5)
ALT: 8 U/L (ref 6–29)
AST: 17 U/L (ref 10–35)
Albumin: 3.5 g/dL — ABNORMAL LOW (ref 3.6–5.1)
Alkaline phosphatase (APISO): 82 U/L (ref 37–153)
BUN/Creatinine Ratio: 6 (calc) (ref 6–22)
BUN: 5 mg/dL — ABNORMAL LOW (ref 7–25)
CO2: 24 mmol/L (ref 20–32)
Calcium: 8.4 mg/dL — ABNORMAL LOW (ref 8.6–10.4)
Chloride: 103 mmol/L (ref 98–110)
Creat: 0.81 mg/dL (ref 0.50–1.05)
Globulin: 3.1 g/dL (calc) (ref 1.9–3.7)
Glucose, Bld: 69 mg/dL (ref 65–99)
Potassium: 3.2 mmol/L — ABNORMAL LOW (ref 3.5–5.3)
Sodium: 138 mmol/L (ref 135–146)
Total Bilirubin: 0.4 mg/dL (ref 0.2–1.2)
Total Protein: 6.6 g/dL (ref 6.1–8.1)
eGFR: 82 mL/min/{1.73_m2} (ref >=60)

## 2021-10-18 LAB — LIPID PANEL
Cholesterol: 212 mg/dL — ABNORMAL HIGH (ref <200)
HDL: 113 mg/dL (ref >=50)
LDL Cholesterol (Calc): 86 mg/dL (calc)
Non-HDL Cholesterol (Calc): 99 mg/dL (calc) (ref <130)
Total CHOL/HDL Ratio: 1.9 (calc) (ref <5.0)
Triglycerides: 51 mg/dL (ref <150)

## 2021-10-18 LAB — HIV-1 RNA QUANT-NO REFLEX-BLD
HIV 1 RNA Quant: <20 Copies/mL — ABNORMAL HIGH
HIV-1 RNA Quant, Log: <1.3 Log cps/mL — ABNORMAL HIGH

## 2021-10-18 LAB — CBC WITH DIFFERENTIAL/PLATELET
Absolute Monocytes: 455 cells/uL (ref 200–950)
Basophils Absolute: 32 cells/uL (ref 0–200)
Basophils Relative: 0.7 %
Eosinophils Absolute: 83 cells/uL (ref 15–500)
Eosinophils Relative: 1.8 %
HCT: 32 % — ABNORMAL LOW (ref 35.0–45.0)
Hemoglobin: 11.1 g/dL — ABNORMAL LOW (ref 11.7–15.5)
Lymphs Abs: 1546 cells/uL (ref 850–3900)
MCH: 33.1 pg — ABNORMAL HIGH (ref 27.0–33.0)
MCHC: 34.7 g/dL (ref 32.0–36.0)
MCV: 95.5 fL (ref 80.0–100.0)
MPV: 10.4 fL (ref 7.5–12.5)
Monocytes Relative: 9.9 %
Neutro Abs: 2484 cells/uL (ref 1500–7800)
Neutrophils Relative %: 54 %
Platelets: 247 10*3/uL (ref 140–400)
RBC: 3.35 10*6/uL — ABNORMAL LOW (ref 3.80–5.10)
RDW: 13.6 % (ref 11.0–15.0)
Total Lymphocyte: 33.6 %
WBC: 4.6 10*3/uL (ref 3.8–10.8)

## 2021-10-18 LAB — RPR: RPR Ser Ql: NONREACTIVE

## 2021-10-19 ENCOUNTER — Telehealth: Payer: Self-pay

## 2021-10-19 NOTE — Telephone Encounter (Signed)
-----   Message from Sandie Ano, RN sent at 10/19/2021 12:17 PM EDT ----- Sure, how much and how many refills? Thanks! ----- Message ----- From: Daiva Eves, Lisette Grinder, MD Sent: 10/19/2021  12:11 PM EDT To: [facility] Triage Nurse Pool  Can we have her start taking of KCL daily for her low potassium? ----- Message ----- From: Interface, [facility] Lab Results In Sent: 10/16/2021  11:05 PM EDT To: Randall Hiss, MD

## 2021-10-19 NOTE — Telephone Encounter (Signed)
Spoke with Marcos Eke, NP - he reviewed patient's labs and medications. He recommends 5 day course of potassium with a repeat BMP some time the second week of July.   Called patient to relay results and need for potassium supplementation, call could not be completed.  Sandie Ano, RN

## 2021-10-20 ENCOUNTER — Other Ambulatory Visit: Payer: Self-pay

## 2021-10-20 DIAGNOSIS — B20 Human immunodeficiency virus [HIV] disease: Secondary | ICD-10-CM

## 2021-10-20 MED ORDER — POTASSIUM CHLORIDE CRYS ER 20 MEQ PO TBCR: 20.0000 meq | EXTENDED_RELEASE_TABLET | Freq: Two times a day (BID) | ORAL | 0 refills | Status: DC

## 2021-10-20 MED ORDER — POTASSIUM CHLORIDE CRYS ER 20 MEQ PO TBCR: 40.0000 meq | EXTENDED_RELEASE_TABLET | Freq: Once | ORAL | 0 refills | Status: DC

## 2021-10-20 NOTE — Telephone Encounter (Signed)
Was able to speak with patient regarding results. Understands she will need to take potassium for 5 days and repeat BMP in two weeks.  Verbalized understanding. Lab appointment scheduled. Juanita Laster, RMA

## 2021-10-20 NOTE — Addendum Note (Signed)
Addended by: Juanita Laster on: 10/20/2021 10:19 AM   Modules accepted: Orders

## 2021-10-26 ENCOUNTER — Telehealth: Payer: Self-pay

## 2021-10-26 MED ORDER — POTASSIUM CHLORIDE CRYS ER 20 MEQ PO TBCR: 40.0000 meq | EXTENDED_RELEASE_TABLET | Freq: Every day | ORAL | 0 refills | Status: AC

## 2021-10-26 NOTE — Telephone Encounter (Signed)
Received call from [facility] requesting new prescription. Attempted to call patient to reschedule lab appointment until after she has finished the five doses of potassium. No answer and unable to leave message.   Will send new prescription.   Sandie Ano, RN

## 2021-10-30 ENCOUNTER — Other Ambulatory Visit: Payer: Managed Care, Other (non HMO)

## 2022-04-11 ENCOUNTER — Ambulatory Visit: Payer: Managed Care, Other (non HMO) | Admitting: Infectious Disease

## 2023-12-30 ENCOUNTER — Telehealth: Payer: Self-pay

## 2023-12-30 NOTE — Telephone Encounter (Signed)
 Attempt to Re-Engage in Care  No office visit or HIV labs completed at [facility] within the last 12 months. Patient is considered out of care.   Last [facility] Visit: 10/16/21  Last HIV Viral Load:  HIV 1 RNA Quant  Date Value Ref Range Status  10/16/2021 <20 (H) Copies/mL Final    Comment:    HIV-1 RNA Detected    Last CD4 Count:  CD4 T Cell Abs  Date Value Ref Range Status  10/16/2021 315 (L) 400 - 1,790 /uL Final    Medication Dispense History:   Dispensed Days Supply Quantity Provider Pharmacy  SYMTUZA  800-150-200-10 MG TAB 01/30/2022 90 90 tablet Fleeta Rothman, Jomarie SAILOR, MD [facility]  SYMTUZA  800-150-200-10 MG TAB 10/28/2021 90 90 tablet Fleeta Rothman, Jomarie SAILOR, MD [facility]  SYMTUZA  800-150-200-10 MG TAB 09/20/2021 30 30 tablet Fleeta Rothman, Jomarie SAILOR, MD [facility]    Interventions: Called patient, call could not be completed. Called patient's son, call could not be completed.   No recent updates in CareEverywhere. No labs in [facility].   Letter mailed to address on file.   Duration of Services: 10 minutes

## 2024-01-31 ENCOUNTER — Telehealth: Payer: Self-pay

## 2024-01-31 NOTE — Telephone Encounter (Signed)
 LINKAGE & RETENTION IN CARE  Last [facility] Visit: 10/16/21  Last HIV Viral Load:  HIV 1 RNA Quant  Date Value Ref Range Status  10/16/2021 <20 (H) Copies/mL Final    Comment:    HIV-1 RNA Detected    Last CD4 Count:  CD4 T Cell Abs  Date Value Ref Range Status  10/16/2021 315 (L) 400 - 1,790 /uL Final    Medication Dispense History:   Dispensed Days Supply Quantity Provider Pharmacy   SYMTUZA  800-150-200-10 MG TAB 01/30/2022 90 90 tablet Fleeta Rothman, Jomarie SAILOR, MD [facility]  SYMTUZA  800-150-200-10 MG TAB 10/28/2021 90 90 tablet Fleeta Rothman, Jomarie SAILOR, MD [facility]    Interventions: called [facility], they do not have any additional contact info for the patient. Tried calling [facility], number is not in service.   Duration of Services: 10 minutes  Keagen Heinlen, BSN, Charity fundraiser

## 2024-03-10 ENCOUNTER — Telehealth: Payer: Self-pay

## 2024-03-10 NOTE — Telephone Encounter (Signed)
 Called [facility] to schedule appointment, number is not in service.   Ignatius Kloos, BSN, RN
